# Patient Record
Sex: Female | Born: 1940 | Race: White | Hispanic: No | Smoking: Never smoker
Health system: Southern US, Community
[De-identification: ages and names within clinical notes are randomized; demographics above are authoritative.]

## PROBLEM LIST (undated history)

## (undated) DIAGNOSIS — M810 Age-related osteoporosis without current pathological fracture: Secondary | ICD-10-CM

## (undated) DIAGNOSIS — R413 Other amnesia: Secondary | ICD-10-CM

## (undated) DIAGNOSIS — E7800 Pure hypercholesterolemia, unspecified: Secondary | ICD-10-CM

## (undated) DIAGNOSIS — I10 Essential (primary) hypertension: Secondary | ICD-10-CM

## (undated) DIAGNOSIS — E222 Syndrome of inappropriate secretion of antidiuretic hormone: Secondary | ICD-10-CM

## (undated) DIAGNOSIS — F32A Depression, unspecified: Secondary | ICD-10-CM

## (undated) DIAGNOSIS — I340 Nonrheumatic mitral (valve) insufficiency: Secondary | ICD-10-CM

## (undated) DIAGNOSIS — Z7901 Long term (current) use of anticoagulants: Secondary | ICD-10-CM

## (undated) DIAGNOSIS — R339 Retention of urine, unspecified: Secondary | ICD-10-CM

## (undated) DIAGNOSIS — S72009A Fracture of unspecified part of neck of unspecified femur, initial encounter for closed fracture: Secondary | ICD-10-CM

## (undated) DIAGNOSIS — R197 Diarrhea, unspecified: Secondary | ICD-10-CM

## (undated) DIAGNOSIS — F329 Major depressive disorder, single episode, unspecified: Secondary | ICD-10-CM

## (undated) DIAGNOSIS — S62109A Fracture of unspecified carpal bone, unspecified wrist, initial encounter for closed fracture: Secondary | ICD-10-CM

## (undated) DIAGNOSIS — Z9229 Personal history of other drug therapy: Secondary | ICD-10-CM

## (undated) DIAGNOSIS — R42 Dizziness and giddiness: Secondary | ICD-10-CM

## (undated) HISTORY — DX: Depression, unspecified: F32.A

## (undated) HISTORY — DX: Personal history of other drug therapy: Z92.29

## (undated) HISTORY — DX: Major depressive disorder, single episode, unspecified: F32.9

## (undated) HISTORY — DX: Dizziness and giddiness: R42

## (undated) HISTORY — PX: MITRAL VALVE REPLACEMENT: SHX147

## (undated) HISTORY — DX: Retention of urine, unspecified: R33.9

## (undated) HISTORY — DX: Long term (current) use of anticoagulants: Z79.01

## (undated) HISTORY — DX: Syndrome of inappropriate secretion of antidiuretic hormone: E22.2

## (undated) HISTORY — DX: Nonrheumatic mitral (valve) insufficiency: I34.0

## (undated) HISTORY — DX: Fracture of unspecified part of neck of unspecified femur, initial encounter for closed fracture: S72.009A

## (undated) HISTORY — DX: Other amnesia: R41.3

## (undated) HISTORY — PX: SHOULDER OPEN ROTATOR CUFF REPAIR: SHX2407

## (undated) HISTORY — DX: Age-related osteoporosis without current pathological fracture: M81.0

## (undated) HISTORY — DX: Pure hypercholesterolemia, unspecified: E78.00

## (undated) HISTORY — DX: Essential (primary) hypertension: I10

## (undated) HISTORY — DX: Diarrhea, unspecified: R19.7

---

## 1998-05-08 ENCOUNTER — Encounter: Payer: Self-pay | Admitting: Obstetrics and Gynecology

## 1998-05-11 ENCOUNTER — Ambulatory Visit (HOSPITAL_COMMUNITY): Admission: RE | Admit: 1998-05-11 | Discharge: 1998-05-11 | Payer: Self-pay | Admitting: Obstetrics and Gynecology

## 1998-07-20 ENCOUNTER — Ambulatory Visit (HOSPITAL_COMMUNITY): Admission: RE | Admit: 1998-07-20 | Discharge: 1998-07-20 | Payer: Self-pay | Admitting: Obstetrics and Gynecology

## 1998-07-20 ENCOUNTER — Encounter: Payer: Self-pay | Admitting: Obstetrics and Gynecology

## 1999-06-07 ENCOUNTER — Other Ambulatory Visit: Admission: RE | Admit: 1999-06-07 | Discharge: 1999-06-07 | Payer: Self-pay | Admitting: Obstetrics and Gynecology

## 2000-10-26 ENCOUNTER — Other Ambulatory Visit: Admission: RE | Admit: 2000-10-26 | Discharge: 2000-10-26 | Payer: Self-pay | Admitting: Obstetrics and Gynecology

## 2004-05-06 ENCOUNTER — Ambulatory Visit: Payer: Self-pay | Admitting: Internal Medicine

## 2004-08-13 ENCOUNTER — Ambulatory Visit: Payer: Self-pay | Admitting: Internal Medicine

## 2004-08-30 ENCOUNTER — Encounter: Payer: Self-pay | Admitting: Gastroenterology

## 2006-12-08 DIAGNOSIS — J309 Allergic rhinitis, unspecified: Secondary | ICD-10-CM | POA: Insufficient documentation

## 2006-12-08 DIAGNOSIS — F3289 Other specified depressive episodes: Secondary | ICD-10-CM | POA: Insufficient documentation

## 2006-12-08 DIAGNOSIS — F329 Major depressive disorder, single episode, unspecified: Secondary | ICD-10-CM

## 2008-07-23 ENCOUNTER — Emergency Department (HOSPITAL_COMMUNITY): Admission: EM | Admit: 2008-07-23 | Discharge: 2008-07-23 | Payer: Self-pay | Admitting: Emergency Medicine

## 2009-12-11 ENCOUNTER — Encounter (INDEPENDENT_AMBULATORY_CARE_PROVIDER_SITE_OTHER): Payer: Self-pay | Admitting: *Deleted

## 2010-01-30 ENCOUNTER — Ambulatory Visit: Payer: Self-pay | Admitting: Gastroenterology

## 2010-01-30 DIAGNOSIS — R197 Diarrhea, unspecified: Secondary | ICD-10-CM | POA: Insufficient documentation

## 2010-02-12 ENCOUNTER — Encounter (INDEPENDENT_AMBULATORY_CARE_PROVIDER_SITE_OTHER): Payer: Self-pay | Admitting: *Deleted

## 2010-06-25 NOTE — Letter (Signed)
Summary: New Patient letter  [facility]  [address] [place], [place] [address]   Phone: [phone]  Fax: [phone]       12/11/2009 MRN: [id number]  [facility] [address] [place], [place]  [address]  Dear Jodi Frank,  Welcome to the Gastroenterology Division at [facility].    You are scheduled to see Dr.  Russella Dar on 01-30-10 at 3:30p.m. on the 3rd floor at [facility], [address]  We ask that you try to arrive at our office 15 minutes prior to your appointment time to allow for check-in.  We would like you to complete the enclosed self-administered evaluation form prior to your visit and bring it with you on the day of your appointment.  We will review it with you.  Also, please bring a complete list of all your medications or, if you prefer, bring the medication bottles and we will list them.  Please bring your insurance card so that we may make a copy of it.  If your insurance requires a referral to see a specialist, please bring your referral form from your primary care physician.  Co-payments are due at the time of your visit and may be paid by cash, check or credit card.     Your office visit will consist of a consult with your physician (includes a physical exam), any laboratory testing he/she may order, scheduling of any necessary diagnostic testing (e.g. x-ray, ultrasound, CT-scan), and scheduling of a procedure (e.g. Endoscopy, Colonoscopy) if required.  Please allow enough time on your schedule to allow for any/all of these possibilities.    If you cannot keep your appointment, please call [phone] to cancel or reschedule prior to your appointment date.  This allows [place] the opportunity to schedule an appointment for another patient in need of care.  If you do not cancel or reschedule by 5 p.m. the business day prior to your appointment date, you will be charged a $50.00 late cancellation/no-show fee.    Thank you for choosing [facility] for your medical needs.  We appreciate the opportunity to care for you.  Please visit [place] at our website  to learn more about our practice.                     Sincerely,                                                             The Gastroenterology Division

## 2010-06-25 NOTE — Letter (Signed)
Summary: [facility] Instructions  [facility]  [address] [place], [place] [address]   Phone: [phone]  Fax: [phone]       Jodi Frank    1941/01/31    MRN: [id number]        Procedure Day /Date: Monday November 21st, 2011     Arrival Time: 9:00am     Procedure Time: 10:00am     Location of Procedure:                    _x _  [facility] (4th Floor)                        PREPARATION FOR COLONOSCOPY WITH MOVIPREP   Starting 5 days prior to your procedure 04/10/10 do not eat nuts, seeds, popcorn, corn, beans, peas,  salads, or any raw vegetables.  Do not take any fiber supplements (e.g. Metamucil, Citrucel, and Benefiber).  THE DAY BEFORE YOUR PROCEDURE         DATE: 04/14/10  DAY: Sunday  1.  Drink clear liquids the entire day-NO SOLID FOOD  2.  Do not drink anything colored red or purple.  Avoid juices with pulp.  No orange juice.  3.  Drink at least 64 oz. (8 glasses) of fluid/clear liquids during the day to prevent dehydration and help the prep work efficiently.  CLEAR LIQUIDS INCLUDE: Water Jello Ice Popsicles Tea (sugar ok, no milk/cream) Powdered fruit flavored drinks Coffee (sugar ok, no milk/cream) Gatorade Juice: apple, white grape, white cranberry  Lemonade Clear bullion, consomm, broth Carbonated beverages (any kind) Strained chicken noodle soup Hard Candy                             4.  In the morning, mix first dose of MoviPrep solution:    Empty 1 Pouch A and 1 Pouch B into the disposable container    Add lukewarm drinking water to the top line of the container. Mix to dissolve    Refrigerate (mixed solution should be used within 24 hrs)  5.  Begin drinking the prep at 5:00 p.m. The MoviPrep container is divided by 4 marks.   Every 15 minutes drink the solution down to the next mark (approximately 8 oz) until the full liter is complete.   6.  Follow completed prep with 16 oz of clear liquid of your choice  (Nothing red or purple).  Continue to drink clear liquids until bedtime.  7.  Before going to bed, mix second dose of MoviPrep solution:    Empty 1 Pouch A and 1 Pouch B into the disposable container    Add lukewarm drinking water to the top line of the container. Mix to dissolve    Refrigerate  THE DAY OF YOUR PROCEDURE      DATE: 04/15/10 DAY: Monday  Beginning at 5:00 a.m. (5 hours before procedure):         1. Every 15 minutes, drink the solution down to the next mark (approx 8 oz) until the full liter is complete.  2. Follow completed prep with 16 oz. of clear liquid of your choice.    3. You may drink clear liquids until 8:00am  (2 HOURS BEFORE PROCEDURE).   MEDICATION INSTRUCTIONS  Unless otherwise instructed, you should take regular prescription medications with a small sip of water   as early as possible the morning of  your procedure.  Stop taking Coumadin on  _ _  (5 days before procedure).  Additional medication instructions:  You will be contaced by our office prior to your procedure for directions on holding your Coumadin/Warfarin.  If you do not hear from our office 1 week prior to your scheduled procedure, please call [phone] to discuss.          OTHER INSTRUCTIONS  You will need a responsible adult at least 68 years of age to accompany you and drive you home.   This person must remain in the waiting room during your procedure.  Wear loose fitting clothing that is easily removed.  Leave jewelry and other valuables at home.  However, you may wish to bring a book to read or  an iPod/MP3 player to listen to music as you wait for your procedure to start.  Remove all body piercing jewelry and leave at home.  Total time from sign-in until discharge is approximately 2-3 hours.  You should go home directly after your procedure and rest.  You can resume normal activities the  day after your procedure.  The day of your procedure you should not:    Drive   Make legal decisions   Operate machinery   Drink alcohol   Return to work  You will receive specific instructions about eating, activities and medications before you leave.    The above instructions have been reviewed and explained to me by   Marchelle Folks.     I fully understand and can verbalize these instructions _____________________________ Date _________

## 2010-06-25 NOTE — Assessment & Plan Note (Signed)
Summary: discuss colon on BT--ch.   History of Present Illness Visit Type: new patient  Primary GI MD: Elie Goody MD [facility] Primary Provider: Sinclair Ship, MD  Requesting Provider: na Chief Complaint: Change in bowel habits, patient has been having loose stools x six months. Pt denies anyother GI complaint History of Present Illness:   This is a 70 year old female, who relates a change in bowel habits, with looser stools, beginning approximately 6 months ago. She states her primary physician obtained stool Hemoccults which were adn blood test which were negative. She states she has a one loose stool daily which is a change from one well-formed stool daily. She has no diet or medication changes.  She underwent a screening colonoscopy in April 2006 by Dr. Achilles Dunk at [facility] which was normal. 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   GI Review of Systems      Denies abdominal pain, acid reflux, belching, bloating, chest pain, dysphagia with liquids, dysphagia with solids, heartburn, loss of appetite, nausea, vomiting, vomiting blood, weight loss, and  weight gain.      Reports change in bowel habits.     Denies anal fissure, black tarry stools, constipation, diarrhea, diverticulosis, fecal incontinence, heme positive stool, hemorrhoids, irritable bowel syndrome, jaundice, light color stool, liver problems, rectal bleeding, and  rectal pain.   Current Medications (verified): 1)  Amlodipine Besylate 5 Mg Tabs (Amlodipine Besylate) .... One Tablet By Mouth Once Daily 2)  Benicar 40 Mg Tabs (Olmesartan Medoxomil) .... One Tablet By Mouth Once Daily 3)  Effexor Xr 75 Mg Xr24h-Cap (Venlafaxine Hcl) .... One Tablet By Mouth Once Daily 4)  Multivitamins   Tabs (Multiple Vitamin) .... One Tablet By Mouth Once Daily 5)  Omega-3 350 Mg Caps (Omega-3 Fatty Acids) .... One Capsule By Mouth Once Daily 6)  Warfarin Sodium 10 Mg Tabs  (Warfarin Sodium) .... One Tablet By Mouth On On Tuesday, Thursday, Saturday, and "Sunday 7)  Warfarin Sodium 7.5 Mg Tabs (Warfarin Sodium) .... One Tablet By Mouth On Monday, Wednesday, and Friday 8)  Hydrochlorothiazide 12.5 Mg Caps (Hydrochlorothiazide) .... One Tablet By Mouth Once Daily  Allergies (verified): No Known Drug Allergies  Past History:  Past Medical History: Allergic rhinitis Depression HT  Past Surgical History: MVR, St. 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  Alcohol Use - yes: 1 daily  Daily Caffeine Use: 4 daily  Illicit Drug Use - no Smoking Status:  never Drug Use:  no  Review of Systems       The pertinent positives and negatives are noted as above and in the HPI. All other ROS were reviewed and were negative.   Vital Signs:  Patient profile:   69 year old female Height:      64 inches Weight:      144 pounds BMI:     24" .81 BSA:     1.70 Pulse rate:   92 / minute Pulse rhythm:   regular BP sitting:   128 / 76  (left arm) Cuff size:   regular  Vitals Entered By: Ok Anis CMA (January 30, 2010 3:35 PM)  Physical Exam  General:  Well developed, well nourished, no acute distress. Head:  Normocephalic and atraumatic. Eyes:  PERRLA, no icterus. Ears:  Normal auditory acuity. Mouth:  No deformity or lesions, dentition normal. Neck:  Supple; no masses or thyromegaly. Lungs:  Clear throughout to auscultation. Heart:  Regular rate and rhythm; no murmurs, rubs,  or bruits. Abdomen:  Soft, nontender and nondistended. No masses, hepatosplenomegaly or hernias noted. Normal bowel sounds. Rectal:  deferred until time of colonoscopy.   Msk:  Symmetrical with no gross  deformities. Normal posture. Pulses:  Normal pulses noted. Extremities:  No clubbing, cyanosis, edema or deformities noted. Neurologic:  Alert and  oriented x4;  grossly normal neurologically. Cervical Nodes:  No significant cervical adenopathy. Inguinal Nodes:  No significant inguinal adenopathy. Psych:  Alert and cooperative. Normal mood and affect.  Impression & Recommendations:  Problem # 1:  DIARRHEA (ICD-787.91) Change in bowel habits, with looser stools, mild diarrhea. Obtain records from her primary physician. Trial of Florastor twice daily. Trial of a lactose-free diet. Consider a trial of metronidazole. Rule out inflammatory bowel disease and microscopic colitis. The risks, benefits and alternatives to colonoscopy with possible biopsy and possible polypectomy were discussed with the patient and they consent to proceed. The procedure will be scheduled electively. If her bowel habits return to normal after a course of Florastor, I would consider canceling the colonoscopy given her ongoing need for warfarin. Orders: Colonoscopy (Colon)  Problem # 2:  ENCOUNTER FOR LONG-TERM USE OF ANTICOAGULANTS (ICD-V58.61) Status post St. Jude mitral valve replacement. Contact the Coumadin clinic for anticoagulation management around the time of her colonoscopy. Orders: Colonoscopy (Colon)  Patient Instructions: 1)  Start Florastor one capsule by mouth two times a day. 2)  Colonoscopy brochure given.  3)  Copy sent to : Sinclair Ship, MD 4)  The medication list was reviewed and reconciled.  All changed / newly prescribed medications were explained.  A complete medication list was provided to the patient / caregiver.  Prescriptions: MOVIPREP 100 GM  SOLR (PEG-KCL-NACL-NASULF-NA ASC-C) As per prep instructions.  #1 x 0   Entered by:   Christie Nottingham CMA (AAMA)   Authorized by:   Meryl Dare MD [facility]   Signed by:   Christie Nottingham CMA (AAMA) on 01/30/2010   Method used:   Print then Give  to Patient   RxID:   [id number]

## 2010-06-25 NOTE — Letter (Signed)
Summary: [facility] Cancel and No Reschedule  [facility]  [address] [place], [place] [address]   Phone: [phone]  Fax: [phone]      February 12, 2010 MRN: [id number]   [facility] [address] [place], [place]  [address]     You recently cancelled your colonoscopy procedure at the [facility] and did not reschedule for another date.    Your Juliza Machnik recommended this procedure for the benefit of your health.  It is very important that you reschedule it.  Failure to do so may be to the detriment of your health.  Please call us at [phone] and we will be happy to assist you with rescheduling.    If you were referred for this procedure by another physician/Mekenna Finau, we will notify him/her that you did not keep your appointment.   Sincerely,  [facility]

## 2010-06-25 NOTE — Procedures (Signed)
Summary: Janeece Fitting MD  Colon/John Ivan Anchors MD   Imported By: Lester Ramey 02/05/2010 10:11:53  _____________________________________________________________________  External Attachment:    Type:   Image     Comment:   External Document

## 2010-07-24 ENCOUNTER — Telehealth: Payer: Self-pay | Admitting: Gastroenterology

## 2010-07-26 ENCOUNTER — Encounter: Payer: Self-pay | Admitting: Gastroenterology

## 2010-08-01 ENCOUNTER — Encounter (INDEPENDENT_AMBULATORY_CARE_PROVIDER_SITE_OTHER): Payer: Self-pay | Admitting: *Deleted

## 2010-08-01 NOTE — Letter (Signed)
Summary: Anticoagulation Modification Letter   G[facility]  [address] [place], [place] [address]   Phone: [phone]  Fax: [phone]    July 26, 2010  Re:    Jodi Frank DOB:    [birth year] MRN:    [id number]    Dear Cory Roughen,   We need to scheduled the above patient for an endoscopic procedure colonoscopy . Our records show that she is on anticoagulation therapy. Please advise as to how long the patient may come off their therapy of coumadin prior to the scheduled procedure(s)    Please fax  the completed form to Darcey Nora, RN [facility] at [phone]  Thank you for your help with this matter.  Sincerely,  Darcey Nora RN, [facility]   Physician Recommendation:  Hold Plavix 7 days prior ________________  Hold Coumadin 5 days prior ____________  Other ______________________________

## 2010-08-01 NOTE — Progress Notes (Signed)
Summary: ? re appt   Phone Note Call from Patient Call back at Work Phone [phone]   Caller: Patient Call For: Dr Russella Dar Reason for Call: Refill Medication Summary of Call: Patient wants to know if she needs to see Dr Russella Dar again for office visit because she's on Warfarin, patient saw him in September for the same reason (consult before colon) but it's been 5 months already. Initial call taken by: Tawni Levy,  July 24, 2010 9:17 AM  Follow-up for Phone Call        Left message for patient to call back Darcey Nora RN, [facility]  July 24, 2010 11:47 AM  Still having loose stool, she can't remember if florastor helped.  She is still on coumadin it is managed at [facility] she will get me that number.  She was scheduled for a colon in 01/2010 but canceled.  Is it ok to reschedule her colon? She is also asking if she should try the florastor again prior to scheduling colon.  See office note from 01/2010 Follow-up by: Darcey Nora RN, CGRN,  July 24, 2010 4:33 PM  Additional Follow-up for Phone Call Additional follow up Details #1::        Retry Florastor bid for 2-4 week but needs colonoscopy to evaluate for the cause. Avoid milk products and minimize caffeine. Send stool hemocults, C&S, O&P, C. Diff PCR, WBCs. OK to schedule direct colonoscopy. Need clearance from PCP or Cardiologist to hold Coumadin for 5 days before colonoscpoy. If stool studies give and answer and she is heme negative could cancel colonoscopy. Additional Follow-up by: Meryl Dare MD Clementeen Graham,  July 24, 2010 8:19 PM    Additional Follow-up for Phone Call Additional follow up Details #2::    Left message for patient to call back Darcey Nora RN, [facility]  July 25, 2010 9:47 AM  Left message for patient to call back Darcey Nora RN, [facility]  July 25, 2010 4:23 PM  Left message for patient to call back Darcey Nora RN, [facility]  July 26, 2010 10:08 AM  Her Coumadin is managed by Cay Schillings at [facility] [phone].  I  have sent a letter to her cardiologist Dr Cory Roughen at [facility].  [phone], fax # [phone] Darcey Nora RN, [facility]  July 26, 2010 11:38 AM  Additional Follow-up for Phone Call Additional follow up Details #3:: Details for Additional Follow-up Action Taken: I spoke with the patient and she is scehduled for a colon for 09/20/10.  She wants to come for her pre-visit 08/02/10 when she comes to pick up her lab specimen cips.  She is aware I have sent a letter to Dr Cory Roughen about holding her coumadin. Additional Follow-up by: Darcey Nora RN, CGRN,  July 26, 2010 4:47 PM

## 2010-08-02 ENCOUNTER — Encounter: Payer: Self-pay | Admitting: Gastroenterology

## 2010-08-02 ENCOUNTER — Encounter (INDEPENDENT_AMBULATORY_CARE_PROVIDER_SITE_OTHER): Payer: Self-pay | Admitting: *Deleted

## 2010-08-02 ENCOUNTER — Other Ambulatory Visit: Payer: PRIVATE HEALTH INSURANCE

## 2010-08-02 DIAGNOSIS — R197 Diarrhea, unspecified: Secondary | ICD-10-CM

## 2010-08-06 ENCOUNTER — Encounter: Payer: Self-pay | Admitting: Gastroenterology

## 2010-08-06 NOTE — Letter (Signed)
Summary: [facility] Instructions  [facility]  [address] [place], [place] [address]   Phone: [phone]  Fax: [phone]       KYNLI CHOU    11-18-1940    MRN: [id number]        Procedure Day Dorna Bloom:  Farrell Ours  09/20/10     Arrival Time:  8:30AM     Procedure Time:  9:30AM     Location of Procedure:                    Juliann Pares  [facility] (4th Floor)                      PREPARATION FOR COLONOSCOPY WITH MOVIPREP   Starting 5 days prior to your procedure 09/15/10 do not eat nuts, seeds, popcorn, corn, beans, peas,  salads, or any raw vegetables.  Do not take any fiber supplements (e.g. Metamucil, Citrucel, and Benefiber).  THE DAY BEFORE YOUR PROCEDURE         DATE: 09/19/10  DAY: THURSDAY  1.  Drink clear liquids the entire day-NO SOLID FOOD  2.  Do not drink anything colored red or purple.  Avoid juices with pulp.  No orange juice.  3.  Drink at least 64 oz. (8 glasses) of fluid/clear liquids during the day to prevent dehydration and help the prep work efficiently.  CLEAR LIQUIDS INCLUDE: Water Jello Ice Popsicles Tea (sugar ok, no milk/cream) Powdered fruit flavored drinks Coffee (sugar ok, no milk/cream) Gatorade Juice: apple, white grape, white cranberry  Lemonade Clear bullion, consomm, broth Carbonated beverages (any kind) Strained chicken noodle soup Hard Candy                             4.  In the morning, mix first dose of MoviPrep solution:    Empty 1 Pouch A and 1 Pouch B into the disposable container    Add lukewarm drinking water to the top line of the container. Mix to dissolve    Refrigerate (mixed solution should be used within 24 hrs)  5.  Begin drinking the prep at 5:00 p.m. The MoviPrep container is divided by 4 marks.   Every 15 minutes drink the solution down to the next mark (approximately 8 oz) until the full liter is complete.   6.  Follow completed prep with 16 oz of clear liquid of your choice (Nothing red  or purple).  Continue to drink clear liquids until bedtime.  7.  Before going to bed, mix second dose of MoviPrep solution:    Empty 1 Pouch A and 1 Pouch B into the disposable container    Add lukewarm drinking water to the top line of the container. Mix to dissolve    Refrigerate  THE DAY OF YOUR PROCEDURE      DATE: 09/20/10   DAY: FRIDAY  Beginning at 4:30AM (5 hours before procedure):         1. Every 15 minutes, drink the solution down to the next mark (approx 8 oz) until the full liter is complete.  2. Follow completed prep with 16 oz. of clear liquid of your choice.    3. You may drink clear liquids until 7:30AM (2 HOURS BEFORE PROCEDURE).   MEDICATION INSTRUCTIONS  Unless otherwise instructed, you should take regular prescription medications with a small sip of water   as early as possible the morning of  your procedure.    Stop taking Coumadin on  _ _  (5 days before procedure).  Additional medication instructions:Call Sheri at [phone] if you haven't heard from her by 09/13/10 re: coumadin/Lovenox [place].         OTHER INSTRUCTIONS  You will need a responsible adult at least 68 years of age to accompany you and drive you home.   This person must remain in the waiting room during your procedure.  Wear loose fitting clothing that is easily removed.  Leave jewelry and other valuables at home.  However, you may wish to bring a book to read or  an iPod/MP3 player to listen to music as you wait for your procedure to start.  Remove all body piercing jewelry and leave at home.  Total time from sign-in until discharge is approximately 2-3 hours.  You should go home directly after your procedure and rest.  You can resume normal activities the  day after your procedure.  The day of your procedure you should not:   Drive   Make legal decisions   Operate machinery   Drink alcohol   Return to work  You will receive specific instructions about eating,  activities and medications before you leave.    The above instructions have been reviewed and explained to me by   Wyona Almas RN  August 02, 2066 10:35 AM     I fully understand and can verbalize these instructions _____________________________ Date _________

## 2010-08-06 NOTE — Miscellaneous (Signed)
Summary: [facility] Previsit/prep  Clinical Lists Changes  Medications: Added new medication of MOVIPREP 100 GM  SOLR (PEG-KCL-NACL-NASULF-NA ASC-C) As per prep instructions. - Signed Rx of MOVIPREP 100 GM  SOLR (PEG-KCL-NACL-NASULF-NA ASC-C) As per prep instructions.;  #1 x 0;  Signed;  Entered by: Wyona Almas RN;  Authorized by: Meryl Dare MD Clementeen Graham;  Method used: Electronically to [facility] [phone] [place], [place]  [address], Ph: [phone], Fax: [phone] Observations: Added new observation of NKA: T (08/02/2010 10:02)    Prescriptions: MOVIPREP 100 GM  SOLR (PEG-KCL-NACL-NASULF-NA ASC-C) As per prep instructions.  #1 x 0   Entered by:   Wyona Almas RN   Authorized by:   Meryl Dare MD [facility]   Signed by:   Wyona Almas RN on 08/02/2010   Method used:   Electronically to        [facility]* (retail)       [phone] [place], [place]  [address]       Ph: [phone]       Fax: [phone]   RxID:   [id number]

## 2010-08-07 ENCOUNTER — Encounter: Payer: Self-pay | Admitting: Gastroenterology

## 2010-08-08 ENCOUNTER — Telehealth: Payer: Self-pay

## 2010-08-22 ENCOUNTER — Other Ambulatory Visit: Payer: Self-pay | Admitting: Gastroenterology

## 2010-08-22 ENCOUNTER — Other Ambulatory Visit: Payer: PRIVATE HEALTH INSURANCE

## 2010-08-22 DIAGNOSIS — Z1289 Encounter for screening for malignant neoplasm of other sites: Secondary | ICD-10-CM

## 2010-08-22 LAB — HEMOCCULT SLIDES (X 3 CARDS)
Fecal Occult Blood: NEGATIVE
OCCULT 1: NEGATIVE
OCCULT 2: NEGATIVE
OCCULT 3: NEGATIVE
OCCULT 4: NEGATIVE
OCCULT 5: NEGATIVE

## 2010-08-22 NOTE — Progress Notes (Signed)
Summary: Lovenox Bridge?  Phone Note Outgoing Call Call back at [phone]   Summary of Call: I have left a messge for the Cay Schillings at [facility] [phone] coumadin clinic.  Per Dr Cory Roughen the patient needs a "heparin bridge" for a colonoscopy.  I did speak with the patient and Dr Russella Dar and they don't want to do a traditional heparin bridge involving admission.  I have asked Dr Electa Sniff if we would use Lovenox.  Patient is scheduled for a colon 09/20/10 Initial call taken by: Darcey Nora RN, CGRN,  August 08, 2010 11:04 AM  Follow-up for Phone Call        I had a return voicemail message from Cay Schillings from [facility], they are asking if the colon could be done on coumadin. They are not wanting to use Lovenox bridge due to past compications with other patients.  Please advise. Follow-up by: Darcey Nora RN, CGRN,  August 08, 2010 3:48 PM  Additional Follow-up for Phone Call Additional follow up Details #1::        As I previously discussed with Lavonna Rua, we can do the colonoscopy on Coumadin but will not be able to remove larger polyps due to bleeding risk, small polyps should be reasonably safe as long as INR is in the correct range. Check PT/INR at our lab about 4-5 days prior to colonoscopy to be sure she is not supratherapeutic for the colonoscopy. Additional Follow-up by: Meryl Dare MD FACG,  August 10, 2010 11:55 PM    Additional Follow-up for Phone Call Additional follow up Details #2::    Spoke with Cay Schillings and let her know Dr. Ardell Isaacs recommendation. Patient to have PT/INR check on 09/16/10. Patient aware. Follow-up by: Selinda Michaels RN,  August 12, 2010 8:53 AM

## 2010-08-22 NOTE — Letter (Signed)
Summary: Anticoagulation Modification Letter / [facility]  Anticoagulation Modification Letter / [facility]   Imported By: Lennie Odor 08/12/2010 12:09:42  _____________________________________________________________________  External Attachment:    Type:   Image     Comment:   External Document

## 2010-09-16 ENCOUNTER — Telehealth: Payer: Self-pay | Admitting: Gastroenterology

## 2010-09-16 ENCOUNTER — Other Ambulatory Visit: Payer: Self-pay | Admitting: Gastroenterology

## 2010-09-16 ENCOUNTER — Other Ambulatory Visit (INDEPENDENT_AMBULATORY_CARE_PROVIDER_SITE_OTHER): Payer: PRIVATE HEALTH INSURANCE

## 2010-09-16 DIAGNOSIS — Z7901 Long term (current) use of anticoagulants: Secondary | ICD-10-CM

## 2010-09-16 LAB — PROTIME-INR: Prothrombin Time: 29.6 s — ABNORMAL HIGH (ref 10.2–12.4)

## 2010-09-16 NOTE — Telephone Encounter (Signed)
I have left a message for the patient that Dr Russella Dar reviewed the PT/INR and ok to proceed with colon on 09/20/10.  I have asked her to please call back for any further questions.

## 2010-09-16 NOTE — Progress Notes (Signed)
I have left a message for the patient ok for colon in the rest of the week

## 2010-09-18 ENCOUNTER — Encounter: Payer: Self-pay | Admitting: Gastroenterology

## 2010-09-19 ENCOUNTER — Encounter: Payer: Self-pay | Admitting: *Deleted

## 2010-09-20 ENCOUNTER — Ambulatory Visit (AMBULATORY_SURGERY_CENTER): Payer: PRIVATE HEALTH INSURANCE | Admitting: Gastroenterology

## 2010-09-20 ENCOUNTER — Encounter: Payer: Self-pay | Admitting: Gastroenterology

## 2010-09-20 VITALS — BP 166/75 | HR 80 | Temp 97.6°F | Resp 20 | Ht 64.0 in | Wt 140.0 lb

## 2010-09-20 DIAGNOSIS — D126 Benign neoplasm of colon, unspecified: Secondary | ICD-10-CM

## 2010-09-20 DIAGNOSIS — R198 Other specified symptoms and signs involving the digestive system and abdomen: Secondary | ICD-10-CM

## 2010-09-20 DIAGNOSIS — R197 Diarrhea, unspecified: Secondary | ICD-10-CM

## 2010-09-20 MED ORDER — SODIUM CHLORIDE 0.9 % IV SOLN: 500.0000 mL | INTRAVENOUS | Status: DC

## 2010-09-20 NOTE — Patient Instructions (Signed)
See blue and green instruction sheets.

## 2010-09-23 ENCOUNTER — Telehealth: Payer: Self-pay | Admitting: *Deleted

## 2010-09-23 NOTE — Telephone Encounter (Signed)
No ID on voice mail.  No message left at this time.

## 2010-09-24 ENCOUNTER — Encounter: Payer: Self-pay | Admitting: Gastroenterology

## 2011-06-17 DIAGNOSIS — I701 Atherosclerosis of renal artery: Secondary | ICD-10-CM | POA: Insufficient documentation

## 2011-09-04 DIAGNOSIS — L719 Rosacea, unspecified: Secondary | ICD-10-CM | POA: Insufficient documentation

## 2011-09-05 DIAGNOSIS — N3281 Overactive bladder: Secondary | ICD-10-CM | POA: Insufficient documentation

## 2011-10-25 DIAGNOSIS — S72009A Fracture of unspecified part of neck of unspecified femur, initial encounter for closed fracture: Secondary | ICD-10-CM

## 2011-10-25 HISTORY — DX: Fracture of unspecified part of neck of unspecified femur, initial encounter for closed fracture: S72.009A

## 2011-12-19 ENCOUNTER — Emergency Department (HOSPITAL_COMMUNITY): Payer: PRIVATE HEALTH INSURANCE

## 2011-12-19 ENCOUNTER — Emergency Department (HOSPITAL_COMMUNITY)
Admission: EM | Admit: 2011-12-19 | Discharge: 2011-12-19 | Disposition: A | Discharge status: Home / Self Care | Payer: PRIVATE HEALTH INSURANCE | Attending: Emergency Medicine | Admitting: Emergency Medicine

## 2011-12-19 ENCOUNTER — Encounter (HOSPITAL_COMMUNITY): Payer: Self-pay | Admitting: Emergency Medicine

## 2011-12-19 DIAGNOSIS — S1093XA Contusion of unspecified part of neck, initial encounter: Secondary | ICD-10-CM | POA: Insufficient documentation

## 2011-12-19 DIAGNOSIS — Z7901 Long term (current) use of anticoagulants: Secondary | ICD-10-CM | POA: Insufficient documentation

## 2011-12-19 DIAGNOSIS — I10 Essential (primary) hypertension: Secondary | ICD-10-CM | POA: Insufficient documentation

## 2011-12-19 DIAGNOSIS — S0990XA Unspecified injury of head, initial encounter: Secondary | ICD-10-CM | POA: Insufficient documentation

## 2011-12-19 DIAGNOSIS — J320 Chronic maxillary sinusitis: Secondary | ICD-10-CM | POA: Insufficient documentation

## 2011-12-19 DIAGNOSIS — S0003XA Contusion of scalp, initial encounter: Secondary | ICD-10-CM | POA: Insufficient documentation

## 2011-12-19 DIAGNOSIS — W010XXA Fall on same level from slipping, tripping and stumbling without subsequent striking against object, initial encounter: Secondary | ICD-10-CM | POA: Insufficient documentation

## 2011-12-19 LAB — PROTIME-INR
INR: 2.24 — ABNORMAL HIGH (ref 0.00–1.49)
Prothrombin Time: 25.2 seconds — ABNORMAL HIGH (ref 11.6–15.2)

## 2011-12-19 MED ORDER — OXYCODONE-ACETAMINOPHEN 5-325 MG PO TABS: 1.0000 | ORAL_TABLET | ORAL | Status: AC | PRN

## 2011-12-19 MED ORDER — OXYCODONE-ACETAMINOPHEN 5-325 MG PO TABS
1.0000 | ORAL_TABLET | Freq: Once | ORAL | Status: AC
  Administered 2011-12-19: 1 via ORAL
  Filled 2011-12-19: qty 1

## 2011-12-19 NOTE — ED Notes (Signed)
Patient transported to CT 

## 2011-12-19 NOTE — ED Notes (Signed)
Pt states she fell in a parking deck Wed. And has bruising to L eye/face and L ribs.

## 2011-12-19 NOTE — ED Notes (Signed)
Pt states she is worried about the swelling to her eye and cheek but denies visual changes.

## 2011-12-26 NOTE — ED Provider Notes (Signed)
History    71 year old female with facial pain and swelling. She had mechanical fall on Wednesday. She struck the left side of her face. No loss of consciousness. Come in today because of persistent swelling. No change in visual acuity. No numbness, tingling or loss of strength. Denies significant headache or neck pain. Back pain. Some mild pain in her left axillary region. No shortness of breath. Patient is on Coumadin.  CSN: [id number]  Arrival date & time 12/19/11  7829   First MD Initiated Contact with Patient 12/19/11 [phone]      Chief Complaint  Patient presents with  . Bleeding/Bruising    (Consider location/radiation/quality/duration/timing/severity/associated sxs/prior treatment) HPI  Past Medical History  Diagnosis Date  . Depression   . Diarrhea   . 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 Hypertension     Past Surgical History  Procedure Date  . Mitral valve replacement     Pt. is on coumadin.  . Shoulder open rotator cuff repair     Family History  Problem Relation Age of Onset  . Stroke Mother   . Colon cancer Paternal Grandmother     History  Substance Use Topics  . Smoking status: Never Smoker   . Smokeless tobacco: Never Used  . Alcohol Use: 4.2 oz/week    7 Glasses of wine per week    OB History    Grav Para Term Preterm Abortions TAB SAB Ect Mult Living                  Review of Systems   Review of symptoms negative unless otherwise noted in HPI.   Allergies  Review of patient's allergies indicates no known allergies.  Home Medications   Current Outpatient Rx  Name Route Sig Dispense Refill  . AMLODIPINE BESYLATE 2.5 MG PO TABS Oral Take 2.5 mg by mouth daily.    . ATENOLOL 25 MG PO TABS Oral Take 25 mg by mouth daily.    Marland Kitchen DOXYCYCLINE HYCLATE 50 MG PO CAPS Oral Take 50 mg by mouth at bedtime.    . OMEGA-3 FATTY ACIDS 1000 MG PO CAPS Oral Take 2 g by mouth daily.    . ADULT MULTIVITAMIN W/MINERALS CH Oral Take 1 tablet by  mouth daily.    Marland Kitchen OLMESARTAN MEDOXOMIL 40 MG PO TABS Oral Take 40 mg by mouth daily.      . VENLAFAXINE HCL ER 150 MG PO CP24 Oral Take 150 mg by mouth daily.    . WARFARIN SODIUM 10 MG PO TABS Oral Take 10 mg by mouth daily. Taken on Tuesday, Thursday and Saturday.    . WARFARIN SODIUM 7.5 MG PO TABS Oral Take 7.5 mg by mouth as directed. Taken on Mondays, Wednesdays, Fridays and Sundays.    . OXYCODONE-ACETAMINOPHEN 5-325 MG PO TABS Oral Take 1 tablet by mouth every 4 (four) hours as needed for pain. 10 tablet 0    BP 118/74  Pulse 66  Temp 98.3 F (36.8 C) (Oral)  Resp 16  SpO2 100%  Physical Exam  Nursing note and vitals reviewed. Constitutional: She is oriented to person, place, and time. She appears well-developed and well-nourished. No distress.  HENT:  Head: Normocephalic.       Left periorbital ecchymosis and swelling. Moderate tenderness in the same distribution.  Eyes: Conjunctivae and EOM are normal. Pupils are equal, round, and reactive to light. Right eye exhibits no discharge. Left eye exhibits no discharge.  Neck: Neck supple.  Cardiovascular: Normal rate, regular rhythm and normal heart sounds.  Exam reveals no gallop and no friction rub.   No murmur heard. Pulmonary/Chest: Effort normal and breath sounds normal. No respiratory distress.  Abdominal: Soft. She exhibits no distension. There is no tenderness.  Musculoskeletal: She exhibits no edema and no tenderness.       No midline spinal tenderness  Neurological: She is alert and oriented to person, place, and time. No cranial nerve deficit. She exhibits normal muscle tone. Coordination normal.       Good finger to nose testing bilaterally. Gait is steady.  Skin: Skin is warm and dry.  Psychiatric: She has a normal mood and affect. Her behavior is normal. Thought content normal.    ED Course  Procedures (including critical care time)  Labs Reviewed  PROTIME-INR - Abnormal; Notable for the following:     Prothrombin Time 25.2 (*)     INR 2.24 (*)     All other components within normal limits  LAB REPORT - SCANNED   No results found.  Ct Maxillofacial Wo Cm  12/19/2011  *RADIOLOGY REPORT*  Clinical Data: Fall.  Left facial pain, bruising, and bleeding.  CT MAXILLOFACIAL WITHOUT CONTRAST  Technique:  Multidetector CT imaging of the maxillofacial structures was performed. Multiplanar CT image reconstructions were also generated.  Comparison: None.  Findings: Scalp hematoma is seen in the left infratemporal fossa as well as hematoma in the subcutaneous tissues of the left maxilla and preseptal soft tissues.  The globes and intraorbital anatomy are normal in appearance.  There is no evidence of orbital or facial bone fracture.  No evidence of sinus air fluid levels or orbital emphysema.  Mucosal thickening seen along the floor of the right maxillary sinus, consistent with chronic sinusitis.  IMPRESSION:  1.  Left facial soft tissue hematoma.  No evidence of facial bone fracture. 2.  Chronic right maxillary sinusitis.  Original Report Authenticated By: Danae Orleans, M.D.    1. Closed head injury   2. Scalp hematoma       MDM  71 year old female with a closed head injury after a fall. Nonfocal neurological examination. No significant headache but a lot of facial pain and swelling. CT of the facial bones without any evidence of fracture. Patient is on Coumadin, but given the stability of symptoms since her fall and nonfocal neurological examination, CT of the head was deferred. Plan symptomatic treatment at this time. Return precautions were discussed. Outpatient followup otherwise to        Raeford Razor, MD 12/26/11 1141

## 2012-10-29 ENCOUNTER — Encounter (HOSPITAL_COMMUNITY): Payer: Self-pay | Admitting: Radiology

## 2012-10-29 ENCOUNTER — Emergency Department (HOSPITAL_COMMUNITY): Payer: PRIVATE HEALTH INSURANCE

## 2012-10-29 ENCOUNTER — Emergency Department (HOSPITAL_COMMUNITY)
Admission: EM | Admit: 2012-10-29 | Discharge: 2012-10-29 | Disposition: A | Discharge status: Home / Self Care | Payer: PRIVATE HEALTH INSURANCE | Attending: Emergency Medicine | Admitting: Emergency Medicine

## 2012-10-29 DIAGNOSIS — Y9389 Activity, other specified: Secondary | ICD-10-CM | POA: Insufficient documentation

## 2012-10-29 DIAGNOSIS — I10 Essential (primary) hypertension: Secondary | ICD-10-CM | POA: Insufficient documentation

## 2012-10-29 DIAGNOSIS — S52502A Unspecified fracture of the lower end of left radius, initial encounter for closed fracture: Secondary | ICD-10-CM

## 2012-10-29 DIAGNOSIS — F329 Major depressive disorder, single episode, unspecified: Secondary | ICD-10-CM | POA: Insufficient documentation

## 2012-10-29 DIAGNOSIS — Z79899 Other long term (current) drug therapy: Secondary | ICD-10-CM | POA: Insufficient documentation

## 2012-10-29 DIAGNOSIS — Z7901 Long term (current) use of anticoagulants: Secondary | ICD-10-CM | POA: Insufficient documentation

## 2012-10-29 DIAGNOSIS — F3289 Other specified depressive episodes: Secondary | ICD-10-CM | POA: Insufficient documentation

## 2012-10-29 DIAGNOSIS — Y9289 Other specified places as the place of occurrence of the external cause: Secondary | ICD-10-CM | POA: Insufficient documentation

## 2012-10-29 DIAGNOSIS — S52509A Unspecified fracture of the lower end of unspecified radius, initial encounter for closed fracture: Secondary | ICD-10-CM | POA: Insufficient documentation

## 2012-10-29 DIAGNOSIS — Z8679 Personal history of other diseases of the circulatory system: Secondary | ICD-10-CM | POA: Insufficient documentation

## 2012-10-29 DIAGNOSIS — W1809XA Striking against other object with subsequent fall, initial encounter: Secondary | ICD-10-CM | POA: Insufficient documentation

## 2012-10-29 DIAGNOSIS — S0990XA Unspecified injury of head, initial encounter: Secondary | ICD-10-CM | POA: Insufficient documentation

## 2012-10-29 LAB — PROTIME-INR
INR: 3.27 — ABNORMAL HIGH (ref 0.00–1.49)
Prothrombin Time: 31.5 seconds — ABNORMAL HIGH (ref 11.6–15.2)

## 2012-10-29 LAB — BASIC METABOLIC PANEL
Calcium: 9.4 mg/dL (ref 8.4–10.5)
GFR calc Af Amer: >90 mL/min (ref >90)
GFR calc non Af Amer: >90 mL/min (ref >90)
Potassium: 4.4 mEq/L (ref 3.5–5.1)
Sodium: 125 mEq/L — ABNORMAL LOW (ref 135–145)

## 2012-10-29 LAB — CBC
Hemoglobin: 12 g/dL (ref 12.0–15.0)
MCH: 28.9 pg (ref 26.0–34.0)
MCHC: 35.2 g/dL (ref 30.0–36.0)
Platelets: 294 10*3/uL (ref 150–400)
RDW: 14.2 % (ref 11.5–15.5)

## 2012-10-29 MED ORDER — ACETAMINOPHEN 325 MG PO TABS
975.0000 mg | ORAL_TABLET | Freq: Once | ORAL | Status: AC
  Administered 2012-10-29: 975 mg via ORAL
  Filled 2012-10-29: qty 3

## 2012-10-29 MED ORDER — OXYCODONE-ACETAMINOPHEN 5-325 MG PO TABS: 1.0000 | ORAL_TABLET | ORAL | Status: DC | PRN

## 2012-10-29 NOTE — ED Provider Notes (Signed)
History     CSN: [id number]  Arrival date & time 10/29/12  0708   First MD Initiated Contact with Patient 10/29/12 [phone]      chief complaint: Fall head injury, left wrist pain the  HPI Patient reports getting up in the middle the night and falling on her way to the bathroom.  She hit the left side of her head without loss consciousness.  She denies neck pain.  She has mild left-sided head pain at this time.  She also reports pain in her left wrist with obvious deformity and swelling noted.  She's concerned that she may have broken this.  She is on Coumadin for history of mechanical valve.  She reports her pain in her left wrist is moderate in severity.  She's not tried any pain medications.  She denies chest pain shortness of breath.  No abdominal pain.  Mild left hip pain on the lateral aspect.  She did ambulate without any difficulty.   Past Medical History  Diagnosis Date  . Depression   . Diarrhea   . 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 Hypertension     Past Surgical History  Procedure Laterality Date  . Mitral valve replacement      Pt. is on coumadin.  . Shoulder open rotator cuff repair      Family History  Problem Relation Age of Onset  . Stroke Mother   . Colon cancer Paternal Grandmother     History  Substance Use Topics  . Smoking status: Never Smoker   . Smokeless tobacco: Never Used  . Alcohol Use: 4.2 oz/week    7 Glasses of wine per week    OB History   Grav Para Term Preterm Abortions TAB SAB Ect Mult Living                  Review of Systems  All other systems reviewed and are negative.    Allergies  Review of patient's allergies indicates no known allergies.  Home Medications   Current Outpatient Rx  Name  Route  Sig  Dispense  Refill  . amLODipine (NORVASC) 5 MG tablet   Oral   Take 5 mg by mouth every evening.         . Carboxymethylcell-Hypromellose (GENTEAL) 0.25-0.3 % GEL   Ophthalmic   Apply 1 application to eye  every evening.         . fish oil-omega-3 fatty acids 1000 MG capsule   Oral   Take 2 g by mouth 2 (two) times daily.          Marland Kitchen labetalol (NORMODYNE) 300 MG tablet   Oral   Take 600 mg by mouth 2 (two) times daily.         . Multiple Vitamin (MULTIVITAMIN WITH MINERALS) TABS   Oral   Take 1 tablet by mouth daily.         Marland Kitchen venlafaxine XR (EFFEXOR-XR) 150 MG 24 hr capsule   Oral   Take 150 mg by mouth every evening.          . warfarin (COUMADIN) 10 MG tablet   Oral   Take 10 mg by mouth See admin instructions. Only on Tuesday evenings         . warfarin (COUMADIN) 7.5 MG tablet   Oral   Take 7.5 mg by mouth as directed. Take every night except Tuesday           BP  132/78  Pulse 85  Temp(Src) 97.8 F (36.6 C)  Resp 18  SpO2 99%  Physical Exam  Nursing note and vitals reviewed. Constitutional: She is oriented to person, place, and time. She appears well-developed and well-nourished. No distress.  HENT:  Head: Normocephalic and atraumatic.  Eyes: EOM are normal.  Neck: Normal range of motion. Neck supple.  C-spine nontender.  C-spine cleared by Nexus criteria.  Cardiovascular: Normal rate, regular rhythm and normal heart sounds.   Pulmonary/Chest: Effort normal and breath sounds normal. She exhibits no tenderness.  Abdominal: Soft. She exhibits no distension. There is no tenderness.  Musculoskeletal: Normal range of motion.  Full range of motion of left hip with normal external and internal rotation.  Swelling and deformity about her left wrist.  Normal left radial pulse.  Normal strength in her left hand.  Pain with range of motion of her left wrist.  Normal range of motion of her left elbow and left shoulder.  Neurological: She is alert and oriented to person, place, and time.  Skin: Skin is warm and dry.  Psychiatric: She has a normal mood and affect. Judgment normal.    ED Course  Procedures (including critical care time)  SPLINT  APPLICATION Date/Time: 10/01/2012 3:38 PM Authorized by: Lyanne Co Consent: Verbal consent obtained. Risks and benefits: risks, benefits and alternatives were discussed Consent given by: patient Splint applied by: orthopedic technician Location details: left wrist Splint type: left sugartong Supplies used: fiberglass Post-procedure: The splinted body part was neurovascularly unchanged following the procedure. Patient tolerance: Patient tolerated the procedure well with no immediate complications.  Reduction of Fracture Date/Time: 10/24/2012 11:41 AM Performed by: Lyanne Co Authorized by: Lyanne Co Consent: Verbal consent obtained. Risks and benefits: risks, benefits and alternatives were discussed Consent given by: patient Required items: required blood products, implants, devices, and special equipment available Time out: Immediately prior to procedure a "time out" was called to verify the correct patient, procedure, equipment, support staff and site/side marked as required. HEMATOMA BLOCK with 1% Lidocaine (7cc) Patient tolerance: Patient tolerated the procedure well with no immediate complications. Bone: Left distal radius Reduction technique: Manipulation     Labs Reviewed  PROTIME-INR - Abnormal; Notable for the following:    Prothrombin Time 31.5 (*)    INR 3.27 (*)    All other components within normal limits  CBC - Abnormal; Notable for the following:    HCT 34.1 (*)    All other components within normal limits  BASIC METABOLIC PANEL - Abnormal; Notable for the following:    Sodium 125 (*)    Chloride 93 (*)    Glucose, Bld 114 (*)    All other components within normal limits   Dg Wrist 2 Views Left  10/29/2012   *RADIOLOGY REPORT*  Clinical Data: Distal radius fracture.  Post reduction film.  LEFT WRIST - 2 VIEW  Comparison: Plain films 10/29/2012 at 7:28 a.m.  Findings: The patient is now in a plaster cast.  Distal radius fracture with dorsal  angulation is again seen.  Position and alignment are mildly improved.  Distal ulnar fracture seen on the comparison study is not well demonstrated on this exam.  The patient is in a fiberglass cast.  IMPRESSION: Mild improvement in position and alignment of a dorsally angulated distal radius fracture.  No new abnormality.   Original Report Authenticated By: Holley Dexter, M.D.   Dg Wrist Complete Left  10/29/2012   *RADIOLOGY REPORT*  Clinical Data: Left wrist  pain secondary to a fall today. Swelling.  LEFT WRIST - COMPLETE 3+ VIEW  Comparison: None.  Findings: There is a slightly dorsally impacted comminuted fracture of the distal radius involving the articular surface.  There is slight cortical irregularity of the distal ulna medially but I do not see a discrete fracture line.  Diffuse osteopenia.  IMPRESSION: Comminuted slightly dorsally impacted fracture of the distal radius.   Original Report Authenticated By: Francene Boyers, M.D.   Ct Head Wo Contrast  10/29/2012   *RADIOLOGY REPORT*  Clinical Data: Head trauma secondary to a fall this morning.  The patient is on Coumadin.  CT HEAD WITHOUT CONTRAST  Technique:  Contiguous axial images were obtained from the base of the skull through the vertex without contrast.  Comparison: None.  Findings: There is no acute intracranial hemorrhage, infarction, or mass lesion.  Brain parenchyma appears normal.  Osseous structures are normal.  Slight scalp swelling over the left parietal bone which may be due to remote trauma.  The patient reports that she struck the left posterior parietal-occipital region.  IMPRESSION: No significant abnormality.   Original Report Authenticated By: Francene Boyers, M.D.   I personally reviewed the imaging tests through PACS system I reviewed available ER/hospitalization records through the EMR    1. Distal radius fracture, left, closed, initial encounter   2. Head injury without concussion or intracranial hemorrhage, initial  encounter       MDM  Head injury with normal CT.  I discussed with the patient and the importance of returning emergency department for worsening headache or development of nausea vomiting and explained to her the risk of delayed bleed on Coumadin.  She understands the importance of close followup for head injury percussions.  Left distal radius fracture was reduced at the bedside with improvement in the aeration.  I discussed the case with hand surgery who will see the patient on Tuesday for followup.  Placed in a left sugar tong splint.  Neurovascularly intact after splint application.  Discharge home with pain medicine.  Hand surgery: Dr. Bradly Bienenstock, MD         Lyanne Co, MD 10/29/12 [phone]

## 2012-10-29 NOTE — ED Notes (Signed)
Pt presents to ed with c/o fall, pt sts she got up to go to bathroom at night and fell in the dark. Pt has swelling to her left wrist.

## 2012-11-02 ENCOUNTER — Emergency Department (HOSPITAL_COMMUNITY): Payer: PRIVATE HEALTH INSURANCE

## 2012-11-02 ENCOUNTER — Inpatient Hospital Stay (HOSPITAL_COMMUNITY)
Admission: EM | Admit: 2012-11-02 | Discharge: 2012-11-05 | DRG: 481 | Disposition: A | Discharge status: Skilled Nursing Facility | Payer: PRIVATE HEALTH INSURANCE | Attending: Family Medicine | Admitting: Family Medicine

## 2012-11-02 ENCOUNTER — Encounter (HOSPITAL_COMMUNITY): Payer: Self-pay | Admitting: Emergency Medicine

## 2012-11-02 DIAGNOSIS — S72109A Unspecified trochanteric fracture of unspecified femur, initial encounter for closed fracture: Principal | ICD-10-CM | POA: Diagnosis present

## 2012-11-02 DIAGNOSIS — S52539A Colles' fracture of unspecified radius, initial encounter for closed fracture: Secondary | ICD-10-CM | POA: Diagnosis present

## 2012-11-02 DIAGNOSIS — Z7901 Long term (current) use of anticoagulants: Secondary | ICD-10-CM

## 2012-11-02 DIAGNOSIS — Z954 Presence of other heart-valve replacement: Secondary | ICD-10-CM

## 2012-11-02 DIAGNOSIS — E871 Hypo-osmolality and hyponatremia: Secondary | ICD-10-CM

## 2012-11-02 DIAGNOSIS — S72002A Fracture of unspecified part of neck of left femur, initial encounter for closed fracture: Secondary | ICD-10-CM

## 2012-11-02 DIAGNOSIS — Z66 Do not resuscitate: Secondary | ICD-10-CM | POA: Diagnosis present

## 2012-11-02 DIAGNOSIS — W19XXXA Unspecified fall, initial encounter: Secondary | ICD-10-CM | POA: Diagnosis present

## 2012-11-02 DIAGNOSIS — J309 Allergic rhinitis, unspecified: Secondary | ICD-10-CM

## 2012-11-02 DIAGNOSIS — R197 Diarrhea, unspecified: Secondary | ICD-10-CM

## 2012-11-02 DIAGNOSIS — S72009A Fracture of unspecified part of neck of unspecified femur, initial encounter for closed fracture: Secondary | ICD-10-CM

## 2012-11-02 DIAGNOSIS — I10 Essential (primary) hypertension: Secondary | ICD-10-CM | POA: Diagnosis present

## 2012-11-02 DIAGNOSIS — F3289 Other specified depressive episodes: Secondary | ICD-10-CM

## 2012-11-02 DIAGNOSIS — F329 Major depressive disorder, single episode, unspecified: Secondary | ICD-10-CM

## 2012-11-02 DIAGNOSIS — D62 Acute posthemorrhagic anemia: Secondary | ICD-10-CM

## 2012-11-02 DIAGNOSIS — S52532K Colles' fracture of left radius, subsequent encounter for closed fracture with nonunion: Secondary | ICD-10-CM

## 2012-11-02 HISTORY — DX: Fracture of unspecified carpal bone, unspecified wrist, initial encounter for closed fracture: S62.109A

## 2012-11-02 LAB — CBC WITH DIFFERENTIAL/PLATELET
Basophils Absolute: 0 10*3/uL (ref 0.0–0.1)
Eosinophils Absolute: 0.2 10*3/uL (ref 0.0–0.7)
Eosinophils Relative: 4 % (ref 0–5)
MCH: 28.5 pg (ref 26.0–34.0)
MCV: 82 fL (ref 78.0–100.0)
Neutrophils Relative %: 68 % (ref 43–77)
Platelets: 344 10*3/uL (ref 150–400)
RBC: 4 MIL/uL (ref 3.87–5.11)
RDW: 14.1 % (ref 11.5–15.5)
WBC: 5.5 10*3/uL (ref 4.0–10.5)

## 2012-11-02 LAB — BASIC METABOLIC PANEL
BUN: 10 mg/dL (ref 6–23)
Chloride: 92 mEq/L — ABNORMAL LOW (ref 96–112)
GFR calc Af Amer: >90 mL/min (ref >90)
GFR calc non Af Amer: >90 mL/min (ref >90)
Potassium: 4 mEq/L (ref 3.5–5.1)
Sodium: 127 mEq/L — ABNORMAL LOW (ref 135–145)

## 2012-11-02 LAB — PROTIME-INR
INR: 4 — ABNORMAL HIGH (ref 0.00–1.49)
Prothrombin Time: 36.6 seconds — ABNORMAL HIGH (ref 11.6–15.2)

## 2012-11-02 LAB — URINALYSIS, ROUTINE W REFLEX MICROSCOPIC
Glucose, UA: NEGATIVE mg/dL
Hgb urine dipstick: NEGATIVE
Ketones, ur: NEGATIVE mg/dL
Protein, ur: NEGATIVE mg/dL
Urobilinogen, UA: 1 mg/dL (ref 0.0–1.0)

## 2012-11-02 LAB — RETICULOCYTES
RBC.: 4.1 MIL/uL (ref 3.87–5.11)
Retic Count, Absolute: 65.6 10*3/uL (ref 19.0–186.0)
Retic Ct Pct: 1.6 % (ref 0.4–3.1)

## 2012-11-02 MED ORDER — ARTIFICIAL TEARS OP OINT
TOPICAL_OINTMENT | Freq: Every day | OPHTHALMIC | Status: DC
  Administered 2012-11-02 – 2012-11-04 (×3): via OPHTHALMIC
  Filled 2012-11-02: qty 3.5

## 2012-11-02 MED ORDER — LABETALOL HCL 300 MG PO TABS
600.0000 mg | ORAL_TABLET | Freq: Two times a day (BID) | ORAL | Status: DC
  Administered 2012-11-02 – 2012-11-05 (×6): 600 mg via ORAL
  Filled 2012-11-02 (×7): qty 2

## 2012-11-02 MED ORDER — VENLAFAXINE HCL ER 150 MG PO CP24
150.0000 mg | ORAL_CAPSULE | Freq: Every morning | ORAL | Status: DC
  Administered 2012-11-03 – 2012-11-05 (×3): 150 mg via ORAL
  Filled 2012-11-02 (×3): qty 1

## 2012-11-02 MED ORDER — MORPHINE SULFATE 2 MG/ML IJ SOLN
1.0000 mg | INTRAMUSCULAR | Status: DC | PRN
  Administered 2012-11-03: 1 mg via INTRAVENOUS
  Filled 2012-11-02: qty 1

## 2012-11-02 MED ORDER — FENTANYL CITRATE 0.05 MG/ML IJ SOLN: 50.0000 ug | INTRAMUSCULAR | Status: DC | PRN

## 2012-11-02 MED ORDER — CARBOXYMETHYLCELL-HYPROMELLOSE 0.25-0.3 % OP GEL: 1.0000 "application " | Freq: Every evening | OPHTHALMIC | Status: DC

## 2012-11-02 MED ORDER — HYDROCODONE-ACETAMINOPHEN 5-325 MG PO TABS
1.0000 | ORAL_TABLET | ORAL | Status: DC | PRN
  Administered 2012-11-03: 2 via ORAL
  Administered 2012-11-03 (×2): 1 via ORAL
  Filled 2012-11-02: qty 2
  Filled 2012-11-02 (×3): qty 1

## 2012-11-02 MED ORDER — SODIUM CHLORIDE 0.9 % IV SOLN
20.0000 mL | INTRAVENOUS | Status: DC
  Administered 2012-11-02: 20 mL via INTRAVENOUS

## 2012-11-02 MED ORDER — SODIUM CHLORIDE 0.9 % IV SOLN
INTRAVENOUS | Status: DC
  Administered 2012-11-02: 20:00:00 via INTRAVENOUS

## 2012-11-02 MED ORDER — AMLODIPINE BESYLATE 5 MG PO TABS
5.0000 mg | ORAL_TABLET | Freq: Every day | ORAL | Status: DC
  Administered 2012-11-02 – 2012-11-04 (×2): 5 mg via ORAL
  Filled 2012-11-02 (×4): qty 1

## 2012-11-02 MED ORDER — ONDANSETRON HCL 4 MG/2ML IJ SOLN: 4.0000 mg | Freq: Once | INTRAMUSCULAR | Status: DC

## 2012-11-02 MED ORDER — VITAMIN K1 10 MG/ML IJ SOLN
10.0000 mg | INTRAMUSCULAR | Status: AC
  Administered 2012-11-02: 10 mg via INTRAVENOUS
  Filled 2012-11-02: qty 1

## 2012-11-02 NOTE — ED Provider Notes (Signed)
History     CSN: [id number]  Arrival date & time 11/02/12  1634   First MD Initiated Contact with Patient 11/02/12 1715      Chief Complaint  Patient presents with  . Hip Injury     Patient is a 72 y.o. female presenting with hip pain. The history is provided by the patient.  Hip Pain This is a new problem. The current episode started more than 2 days ago. The problem has been gradually worsening. Pertinent negatives include no chest pain and no shortness of breath. The symptoms are aggravated by walking. The symptoms are relieved by rest. She has tried rest for the symptoms. The treatment provided mild relief.    Past Medical History  Diagnosis Date  . Depression   . Diarrhea   . 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 Hypertension   . Broken wrist     Past Surgical History  Procedure Laterality Date  . Mitral valve replacement      Pt. is on coumadin.  . Shoulder open rotator cuff repair      Family History  Problem Relation Age of Onset  . Stroke Mother   . Colon cancer Paternal Grandmother     History  Substance Use Topics  . Smoking status: Never Smoker   . Smokeless tobacco: Never Used  . Alcohol Use: 4.2 oz/week    7 Glasses of wine per week    OB History   Grav Para Term Preterm Abortions TAB SAB Ect Mult Living                  Review of Systems  Constitutional: Negative for fever.  HENT: Negative for neck pain.   Respiratory: Negative for shortness of breath.   Cardiovascular: Negative for chest pain.  Gastrointestinal: Negative for vomiting and diarrhea.  Musculoskeletal: Negative for back pain.  Neurological: Negative for weakness.  All other systems reviewed and are negative.    Allergies  Review of patient's allergies indicates no known allergies.  Home Medications   Current Outpatient Rx  Name  Route  Sig  Dispense  Refill  . acetaminophen (TYLENOL) 500 MG tablet   Oral   Take 1,500 mg by mouth every 6 (six) hours as  needed for pain.         Marland Kitchen amLODipine (NORVASC) 5 MG tablet   Oral   Take 5 mg by mouth every evening.         . Carboxymethylcell-Hypromellose (GENTEAL) 0.25-0.3 % GEL   Ophthalmic   Apply 1 application to eye every evening.         . fish oil-omega-3 fatty acids 1000 MG capsule   Oral   Take 2 g by mouth 2 (two) times daily.          Marland Kitchen labetalol (NORMODYNE) 300 MG tablet   Oral   Take 600 mg by mouth 2 (two) times daily.         . Multiple Vitamin (MULTIVITAMIN WITH MINERALS) TABS   Oral   Take 1 tablet by mouth daily.         Marland Kitchen oxyCODONE-acetaminophen (PERCOCET/ROXICET) 5-325 MG per tablet   Oral   Take 1 tablet by mouth every 4 (four) hours as needed for pain.   30 tablet   0   . venlafaxine XR (EFFEXOR-XR) 150 MG 24 hr capsule   Oral   Take 150 mg by mouth every morning.          Marland Kitchen  warfarin (COUMADIN) 10 MG tablet   Oral   Take 10 mg by mouth See admin instructions. Only on Tuesday evenings         . warfarin (COUMADIN) 7.5 MG tablet   Oral   Take 7.5 mg by mouth as directed. Take every night except Tuesday           BP 202/72  Pulse 81  Temp(Src) 97.8 F (36.6 C) (Oral)  SpO2 98%  Physical Exam CONSTITUTIONAL: Well developed/well nourished HEAD: Normocephalic/atraumatic EYES: EOMI/PERRL ENMT: Mucous membranes moist NECK: supple no meningeal signs SPINE:no cervical spine tenderness CV: murmur noted LUNGS: Lungs are clear to auscultation bilaterally, no apparent distress ABDOMEN: soft, nontender, no rebound or guarding NEURO: Pt is awake/alert, moves all extremitiesx4 EXTREMITIES: pulses normal Left UE in splint, no sensory deficit to fingers Tenderness with movement of left hip All extremities/joints palpated/ranged and nontender SKIN: warm, color normal PSYCH: no abnormalities of mood noted  ED Course  Procedures   Labs Reviewed  CBC WITH DIFFERENTIAL - Abnormal; Notable for the following:    Hemoglobin 11.4 (*)    HCT  32.8 (*)    All other components within normal limits  BASIC METABOLIC PANEL  PROTIME-INR  TYPE AND SCREEN   Dg Chest Port 1 View  11/02/2012   *RADIOLOGY REPORT*  Clinical Data: Left wrist and hip fractures.  Preoperative respiratory exam.  PORTABLE CHEST - 1 VIEW  Comparison: None.  Findings: The patient is status post prior median sternotomy. Heart size and mediastinal contours are within normal limits. Lungs show no evidence of edema, consolidation or significant pleural fluid.  Scarring and minimal subsegmental atelectasis is present at both lung bases.  The visualized bony thorax is unremarkable.  IMPRESSION: No active disease.   Original Report Authenticated By: Irish Lack, M.D.   6:48 PM Pt with recent fall on 10/29/12 and has persistent pain in left hip She has been ambulating Seen in ortho office today for left distal radius fx.  She had left hip xray today that showed hip fracture She is currently stable She is on coumadin for previous heart valve surgery I spoke to dr Netta Corrigan, will perform surgery tomorrow Awaiting call back from [facility]     MDM  Nursing notes including past medical history and social history reviewed and considered in documentation xrays reviewed and considered Labs/vital reviewed and considered Previous records reviewed and considered - recent ED visit reviewed        Date: 11/02/2012  Rate: 75  Rhythm: normal sinus rhythm  QRS Axis: right  Intervals: normal  ST/T Wave abnormalities: nonspecific ST changes  Conduction Disutrbances:none  Narrative Interpretation:   Old EKG Reviewed: unchanged    Joya Gaskins, MD 11/02/12 1850

## 2012-11-02 NOTE — H&P (Signed)
Hospitalist Admission History and Physical  Patient name: Jodi Frank Medical record number: [id number] Date of birth: 06-Jan-1941 Age: 72 y.o. Gender: female  Primary Care Provider: CASSIDY-VU,LISA, MD Cardiology: [facility]   Chief Complaint: Left hip fracture  History of Present Illness:This is a 72 y.o. 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 Patient noted have been seen in the ER about 2-3 days ago for a fall. Patient had a left distal radius fracture at the time. Patient states she had some mild hip pain time however was fairly insignificant. A x-ray was then obtained at the time. Patient has followup with [facility] orthopedics today about her left wrist when she complained about her left hip. A left hip x-ray was obtained showing an impacted left hip fracture. Patient was sent to the ER for admission as plan is for surgery for hip fracture. Patient noted to be on Coumadin for mechanical mitral valve. Patient denies any bleeding, weakness, excessive bruising. Patient denies any headache, dizziness, shortness of breath, chest pain prior to the fall. Patient states that she fell on an object leg night which was initial cause for the fall to 3 days ago.  Patient Active Problem List   Diagnosis Date Noted  . DIARRHEA 01/30/2010  . DEPRESSION 12/08/2006  . ALLERGIC RHINITIS 12/08/2006   Past Medical History: Past Medical History  Diagnosis Date  . Depression   . Diarrhea   . 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 Hypertension   . Broken wrist     Past Surgical History: Past Surgical History  Procedure Laterality Date  . Mitral valve replacement      Pt. is on coumadin.  . Shoulder open rotator cuff repair      Social History: History   Social History  . Marital Status: Divorced    Spouse Name: N/A    Number of Children: N/A  . Years of  Education: N/A   Social History Main Topics  . Smoking status: Never Smoker   . Smokeless tobacco: Never Used  . Alcohol Use: 4.2 oz/week    7 Glasses of wine per week  . Drug Use: No  . Sexually Active: None   Other Topics Concern  . None   Social History Narrative  . None    Family History: Family History  Problem Relation Age of Onset  . Stroke Mother   . Colon cancer Paternal Grandmother     Allergies: No Known Allergies  Current Facility-Administered Medications  Medication Dose Route Frequency Provider Last Rate Last Dose  . 0.9 %  sodium chloride infusion  500 mL Intravenous Continuous Meryl Dare, MD      . 0.9 %  sodium chloride infusion  20 mL Intravenous Continuous Joya Gaskins, MD 50 mL/hr at 11/02/12 1746 20 mL at 11/02/12 1746  . 0.9 %  sodium chloride infusion   Intravenous Continuous Doree Albee, MD      . fentaNYL (SUBLIMAZE) injection 50 mcg  50 mcg Intravenous Q30 min PRN Joya Gaskins, MD      . HYDROcodone-acetaminophen (NORCO/VICODIN) 5-325 MG per tablet 1-2 tablet  1-2 tablet Oral Q4H PRN Doree Albee, MD      . morphine 2 MG/ML injection 1-2 mg  1-2 mg Intravenous Q2H PRN Doree Albee, MD      . ondansetron [facility]) injection 4 mg  4 mg Intravenous  Once Joya Gaskins, MD       Current Outpatient Prescriptions  Medication Sig Dispense Refill  . acetaminophen (TYLENOL) 500 MG tablet Take 1,500 mg by mouth every 6 (six) hours as needed for pain.      Marland Kitchen amLODipine (NORVASC) 5 MG tablet Take 5 mg by mouth every evening.      . Carboxymethylcell-Hypromellose (GENTEAL) 0.25-0.3 % GEL Apply 1 application to eye every evening.      . fish oil-omega-3 fatty acids 1000 MG capsule Take 2 g by mouth 2 (two) times daily.       Marland Kitchen labetalol (NORMODYNE) 300 MG tablet Take 600 mg by mouth 2 (two) times daily.      . Multiple Vitamin (MULTIVITAMIN WITH MINERALS) TABS Take 1 tablet by mouth daily.      Marland Kitchen oxyCODONE-acetaminophen (PERCOCET/ROXICET)  5-325 MG per tablet Take 1 tablet by mouth every 4 (four) hours as needed for pain.  30 tablet  0  . venlafaxine XR (EFFEXOR-XR) 150 MG 24 hr capsule Take 150 mg by mouth every morning.       . warfarin (COUMADIN) 10 MG tablet Take 10 mg by mouth See admin instructions. Only on Tuesday evenings      . warfarin (COUMADIN) 7.5 MG tablet Take 7.5 mg by mouth as directed. Take every night except Tuesday       Review Of Systems: 12 point ROS negative except as noted above in HPI.  Physical Exam: Filed Vitals:   11/02/12 1711  BP: 202/72  Pulse: 81  Temp: 97.8 F (36.6 C)    General: alert and cooperative HEENT: PERRLA, extra ocular movement intact and mildly dry oral mucous membranes  Heart: S1, S2 normal, no murmur, rub or gallop, regular rate and rhythm Lungs: clear to auscultation, no wheezes or rales and unlabored breathing Abdomen: abdomen is soft without significant tenderness, masses, organomegaly or guarding Extremities: mild L hip TTP, L forearm/wrist in splint, neurovascularly intact distally  Skin:no rashes, no ecchymoses Neurology: normal without focal findings, mental status, speech normal, alert and oriented x3, PERLA and reflexes normal and symmetric  Labs and Imaging: Lab Results  Component Value Date/Time   NA 127* 11/02/2012  5:20 PM   K 4.0 11/02/2012  5:20 PM   CL 92* 11/02/2012  5:20 PM   CO2 23 11/02/2012  5:20 PM   BUN 10 11/02/2012  5:20 PM   CREATININE 0.45* 11/02/2012  5:20 PM   GLUCOSE 100* 11/02/2012  5:20 PM   Lab Results  Component Value Date   WBC 5.5 11/02/2012   HGB 11.4* 11/02/2012   HCT 32.8* 11/02/2012   MCV 82.0 11/02/2012   PLT 344 11/02/2012    Dg Chest Port 1 View  11/02/2012   *RADIOLOGY REPORT*  Clinical Data: Left wrist and hip fractures.  Preoperative respiratory exam.  PORTABLE CHEST - 1 VIEW  Comparison: None.  Findings: The patient is status post prior median sternotomy. Heart size and mediastinal contours are within normal limits. Lungs  show no evidence of edema, consolidation or significant pleural fluid.  Scarring and minimal subsegmental atelectasis is present at both lung bases.  The visualized bony thorax is unremarkable.  IMPRESSION: No active disease.   Original Report Authenticated By: Irish Lack, M.D.    EKG: NSR   Assessment and Plan: Jodi Frank is a 72 y.o. year old female presenting with L hip fracture   L hip fracture:  [facility] is aware of case.  Plan is  for OR tomorrow pending normalization of INR Percocet and IV morphine for pain control.   HTN: Elevated BP in setting of hip fracture. No cardiac complaints. Continue home meds. Titrate meds as clinically indicated.   Mitral Valve Replacement:  Followed by [facility]. No cardiac complaints currently. No other known cardiac issues.  INR pending. No signs of active bleeding. Hgb stable from recent ER visit. Heparin per pharmacy pending OR.  Consider cards consult if INR stablization is protracted.     Hyponatremia: Pt states that this is a chronic issue. Mildly dry on exam. Not on diuretic. Will gently hydrate with normal saline. Check serum Osm. Recheck in am.    FEN/GI: NPO PMN.  Prophylaxis: heparin per pharmacy  Disposition: pending further evaluation  Code Status:DNR        Doree Albee MD  Pager: [phone]

## 2012-11-02 NOTE — Progress Notes (Signed)
Pharmacy: Brief Anticoag Note  INR 4 Verbal order received from Dr. Darrelyn Hillock for vitamin K 10 mg IV x 1 tonight, Repeat INR with AM labs and again at noon on 6/1.  Also to discontinue heparin per pharmacy.   Clydene Fake PharmD Pager #: [phone] 9:24 PM 11/02/2012

## 2012-11-02 NOTE — Consult Note (Signed)
Reason for Consult:Hip Fracture on THe Left. Referring Physician: Dr. Ortman  Jodi Frank is an 72 y.o. female.  HPI: Patient fell last Friday and sustained a Colles Fracture on the Left and a Femoral Neck Fracture.  Past Medical History  Diagnosis Date  . Depression   . Diarrhea   . 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 Hypertension   . Broken wrist     Past Surgical History  Procedure Laterality Date  . Mitral valve replacement      Pt. is on coumadin.  . Shoulder open rotator cuff repair      Family History  Problem Relation Age of Onset  . Stroke Mother   . Colon cancer Paternal Grandmother     Social History:  reports that she has never smoked. She has never used smokeless tobacco. She reports that she drinks about 4.2 ounces of alcohol per week. She reports that she does not use illicit drugs.  Allergies: No Known Allergies  Medications: I have reviewed the patient's current medications.  Results for orders placed during the hospital encounter of 11/02/12 (from the past 48 hour(s))  BASIC METABOLIC PANEL     Status: Abnormal   Collection Time    11/02/12  5:20 PM      Result Value Range   Sodium 127 (*) 135 - 145 mEq/L   Potassium 4.0  3.5 - 5.1 mEq/L   Chloride 92 (*) 96 - 112 mEq/L   CO2 23  19 - 32 mEq/L   Glucose, Bld 100 (*) 70 - 99 mg/dL   BUN 10  6 - 23 mg/dL   Creatinine, Ser 0.45 (*) 0.50 - 1.10 mg/dL   Calcium 9.6  8.4 - 10.5 mg/dL   GFR calc non Af Amer >90  >90 mL/min   GFR calc Af Amer >90  >90 mL/min   Comment:            The eGFR has been calculated     using the CKD EPI equation.     This calculation has not been     validated in all clinical     situations.     eGFR's persistently     <90 mL/min signify     possible Chronic Kidney Disease.  CBC WITH DIFFERENTIAL     Status: Abnormal   Collection Time    11/02/12  5:20 PM      Result Value Range   WBC 5.5  4.0 - 10.5 K/uL   RBC 4.00  3.87 - 5.11 MIL/uL   Hemoglobin 11.4  (*) 12.0 - 15.0 g/dL   HCT 32.8 (*) 36.0 - 46.0 %   MCV 82.0  78.0 - 100.0 fL   MCH 28.5  26.0 - 34.0 pg   MCHC 34.8  30.0 - 36.0 g/dL   RDW 14.1  11.5 - 15.5 %   Platelets 344  150 - 400 K/uL   Neutrophils Relative % 68  43 - 77 %   Neutro Abs 3.7  1.7 - 7.7 K/uL   Lymphocytes Relative 15  12 - 46 %   Lymphs Abs 0.9  0.7 - 4.0 K/uL   Monocytes Relative 12  3 - 12 %   Monocytes Absolute 0.7  0.1 - 1.0 K/uL   Eosinophils Relative 4  0 - 5 %   Eosinophils Absolute 0.2  0.0 - 0.7 K/uL   Basophils Relative 1  0 - 1 %   Basophils Absolute   0.0  0.0 - 0.1 K/uL  PROTIME-INR     Status: Abnormal   Collection Time    11/02/12  5:20 PM      Result Value Range   Prothrombin Time 36.6 (*) 11.6 - 15.2 seconds   INR 4.00 (*) 0.00 - 1.49  TYPE AND SCREEN     Status: None   Collection Time    11/02/12  5:20 PM      Result Value Range   ABO/RH(D) O POS     Antibody Screen NEG     Sample Expiration 11/05/2012    RETICULOCYTES     Status: None   Collection Time    11/02/12  7:40 PM      Result Value Range   Retic Ct Pct 1.6  0.4 - 3.1 %   RBC. 4.10  3.87 - 5.11 MIL/uL   Retic Count, Manual 65.6  19.0 - 186.0 K/uL  ABO/RH     Status: None   Collection Time    11/02/12  7:50 PM      Result Value Range   ABO/RH(D) O POS    URINALYSIS, ROUTINE W REFLEX MICROSCOPIC     Status: Abnormal   Collection Time    11/02/12  7:51 PM      Result Value Range   Color, Urine YELLOW  YELLOW   APPearance CLEAR  CLEAR   Specific Gravity, Urine 1.012  1.005 - 1.030   pH 6.5  5.0 - 8.0   Glucose, UA NEGATIVE  NEGATIVE mg/dL   Hgb urine dipstick NEGATIVE  NEGATIVE   Bilirubin Urine NEGATIVE  NEGATIVE   Ketones, ur NEGATIVE  NEGATIVE mg/dL   Protein, ur NEGATIVE  NEGATIVE mg/dL   Urobilinogen, UA 1.0  0.0 - 1.0 mg/dL   Nitrite NEGATIVE  NEGATIVE   Leukocytes, UA TRACE (*) NEGATIVE  URINE MICROSCOPIC-ADD ON     Status: None   Collection Time    11/02/12  7:51 PM      Result Value Range   WBC, UA 0-2   <3 WBC/hpf    Dg Chest Port 1 View  11/02/2012   *RADIOLOGY REPORT*  Clinical Data: Left wrist and hip fractures.  Preoperative respiratory exam.  PORTABLE CHEST - 1 VIEW  Comparison: None.  Findings: The patient is status post prior median sternotomy. Heart size and mediastinal contours are within normal limits. Lungs show no evidence of edema, consolidation or significant pleural fluid.  Scarring and minimal subsegmental atelectasis is present at both lung bases.  The visualized bony thorax is unremarkable.  IMPRESSION: No active disease.   Original Report Authenticated By: Glenn Yamagata, M.D.    Review of Systems  Constitutional: Negative.   HENT: Negative.   Eyes: Negative.   Respiratory: Negative.   Cardiovascular: Negative.   Gastrointestinal: Negative.   Genitourinary: Negative.   Musculoskeletal: Positive for joint pain.  Skin: Negative.   Neurological: Negative.   Endo/Heme/Allergies: Negative.   Psychiatric/Behavioral: Negative.    Blood pressure 146/65, pulse 67, temperature 98.3 F (36.8 C), temperature source Oral, resp. rate 16, SpO2 94.00%. Physical Exam  Constitutional: She appears well-developed.  HENT:  Head: Normocephalic.  Eyes: Pupils are equal, round, and reactive to light.  Neck: Normal range of motion.  Cardiovascular: Normal rate.   Mechanical HeartValve with a audible Click.  Respiratory: Effort normal.  GI: Soft.  Musculoskeletal:  Fracture Radius and Impacted,Valgus Left Hip Fracture.  Neurological: She is alert.  Skin: Skin is warm.  Psychiatric: She has   a normal mood and affect.    Assessment/Plan: HIp Pinning Tomorrow and Vitamin K to Reduce her INR per Pharmacy  [name] A 11/02/2012, 9:17 PM      

## 2012-11-02 NOTE — Progress Notes (Signed)
Choices offered   HOME HEALTH AGENCIES SERVING [place]   Agencies that are Medicare-Certified and are affiliated with [facility]  Telephone Number Address  [facility]   [facility] has ownership interest in this company; however, you are under no obligation to use this agency. [phone] or  [phone] [address] [place], [place] [address] [email or web address]   Agencies that are Medicare-Certified and are not affiliated with [facility]                                                                                 Home Health Agency Telephone Number Address  [facility] [phone] Fax [phone] [address], [address] [place], [place]  [address] [email or web address]  [facility] [phone] or [phone] Fax [phone] [address] [place], [place] [address] [email or web address]  [facility] [phone] Fax [phone] [address] [place], [place] [address] [email or web address]  [facility] [phone] Fax [phone] [address], [address] [place], [place]  [address] [email or web address]  [facility] [phone] Fax [phone] [address], [address] [place], [place] [address] [email or web address]  [facility] [phone] [address] [place], [place] [address] [email or web address]  [facility] [phone]  [address] [place], [place] [address] [email or web address]  [facility] [phone] or [phone] Fax number [phone] [address] [place], [place]  [address] [email or web address]  [facility] [phone] Fax [phone] [address] [place], [place]   [address]  [facility]  [phone] Fax [phone] [address] [place], [place] [address] [email or web address]   

## 2012-11-02 NOTE — ED Notes (Signed)
Pt states that she fell 5 days ago had a broken L wrist. States she was seeing Dr. Orlan Leavens today when she stated that her L hip was hurting as well. Pt was Xrayed and saw that she had a broken hip as well. Dr. Ottie Glazier sent her here and stated that she would need surgery tonight. Pt has been walking on the hip since Friday. Hypertensive at this time. Takes half her meds in the morning for BP and half at night.

## 2012-11-02 NOTE — Progress Notes (Signed)
ANTICOAGULATION CONSULT NOTE - Initial Consult  Pharmacy Consult for IV Heparin  Indication: Mechanical Mitral Valve  No Known Allergies  Patient Measurements:   Heparin Dosing Weight:  Vital Signs: Temp: 97.8 F (36.6 C) (06/10 1711) Temp src: Oral (06/10 1711) BP: 196/70 mmHg (06/10 1938) Pulse Rate: 80 (06/10 1939)  Labs:  Recent Labs  11/02/12 1720  HGB 11.4*  HCT 32.8*  PLT 344  CREATININE 0.45*    The CrCl is unknown because both a height and weight (above a minimum accepted value) are required for this calculation.   Medical History: Past Medical History  Diagnosis Date  . Depression   . Diarrhea   . 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 Hypertension   . Broken wrist     Medications:   (Not in a hospital admission) Scheduled:  . amLODipine  5 mg Oral q1800  . artificial tears   Both Eyes QHS  . labetalol  600 mg Oral BID  . ondansetron (ZOFRAN) IV  4 mg Intravenous Once  . [START ON 11/03/2012] venlafaxine XR  150 mg Oral q morning - 10a   Infusions:  . sodium chloride    . sodium chloride 20 mL (11/02/12 1746)  . sodium chloride     PRN: fentaNYL, HYDROcodone-acetaminophen, morphine injection Anti-infectives   None      Assessment:  72 yo F on chronic anticoagulation with warfarin for mechanic mitral valve replacement. She presents with a Left Hip fracture s/p mechanical fall with plans for orthopedic surgery in AM.  Pharmacy asked to dose IV heparin to bridge prior to surgery while off warfarin.   Warfarin dose PTA = 7.5 mg daily except on tuesdays take 10 mg; Last dose PTA 6/9  INR on admission = 4.   will f/u plan with MD   Goal of Therapy:  INR 2-3 Heparin level 0.3-0.7 units/ml Monitor platelets by anticoagulation protocol: Yes   Plan:  1.) INR on admission supratherapeutic - f/u anticoagulation plan with MD.  Will hold off on starting IV heparin for now 2.) RN has been notified we will wait on IV heparin until  further orders from MD.   Clydene Fake PharmD Pager #: [phone] 8:42 PM 11/02/2012

## 2012-11-02 NOTE — Progress Notes (Signed)
ED CM noted Cm consult for possible home health needs at d/c. Spoke with pt briefly. CNAs present at bedside to insert foley Left [facility] [place] [facility] agencies at bedside for pt

## 2012-11-03 ENCOUNTER — Inpatient Hospital Stay (HOSPITAL_COMMUNITY): Payer: PRIVATE HEALTH INSURANCE | Admitting: Anesthesiology

## 2012-11-03 ENCOUNTER — Encounter (HOSPITAL_COMMUNITY): Payer: Self-pay | Admitting: Anesthesiology

## 2012-11-03 ENCOUNTER — Inpatient Hospital Stay (HOSPITAL_COMMUNITY): Payer: PRIVATE HEALTH INSURANCE

## 2012-11-03 ENCOUNTER — Ambulatory Visit: Admit: 2012-11-03 | Payer: Self-pay | Admitting: Orthopedic Surgery

## 2012-11-03 ENCOUNTER — Encounter (HOSPITAL_COMMUNITY): Admission: EM | Disposition: A | Discharge status: Skilled Nursing Facility | Payer: Self-pay | Source: Home / Self Care

## 2012-11-03 ENCOUNTER — Encounter (HOSPITAL_COMMUNITY): Payer: Self-pay | Admitting: Certified Registered Nurse Anesthetist

## 2012-11-03 DIAGNOSIS — IMO0002 Reserved for concepts with insufficient information to code with codable children: Secondary | ICD-10-CM

## 2012-11-03 DIAGNOSIS — E871 Hypo-osmolality and hyponatremia: Secondary | ICD-10-CM

## 2012-11-03 HISTORY — PX: HIP PINNING,CANNULATED: SHX1758

## 2012-11-03 LAB — IRON AND TIBC
Iron: 25 ug/dL — ABNORMAL LOW (ref 42–135)
Saturation Ratios: 8 % — ABNORMAL LOW (ref 20–55)
TIBC: 330 ug/dL (ref 250–470)
UIBC: 305 ug/dL (ref 125–400)

## 2012-11-03 LAB — SURGICAL PCR SCREEN: Staphylococcus aureus: NEGATIVE

## 2012-11-03 LAB — PROTIME-INR
INR: 1.39 (ref 0.00–1.49)
Prothrombin Time: 16.7 s — ABNORMAL HIGH (ref 11.6–15.2)

## 2012-11-03 LAB — FERRITIN: Ferritin: 272 ng/mL (ref 10–291)

## 2012-11-03 LAB — VITAMIN B12: Vitamin B-12: 1236 pg/mL — ABNORMAL HIGH (ref 211–911)

## 2012-11-03 SURGERY — FIXATION, FEMUR, NECK, PERCUTANEOUS, USING SCREW
Anesthesia: General | Site: Hip | Laterality: Left | Wound class: Clean

## 2012-11-03 MED ORDER — CEFAZOLIN SODIUM-DEXTROSE 2-3 GM-% IV SOLR
INTRAVENOUS | Status: DC | PRN
  Administered 2012-11-03: 2 g via INTRAVENOUS

## 2012-11-03 MED ORDER — PHENOL 1.4 % MT LIQD: 1.0000 | OROMUCOSAL | Status: DC | PRN

## 2012-11-03 MED ORDER — ONDANSETRON HCL 4 MG PO TABS: 4.0000 mg | ORAL_TABLET | Freq: Four times a day (QID) | ORAL | Status: DC | PRN

## 2012-11-03 MED ORDER — SODIUM CHLORIDE 0.9 % IR SOLN
Status: DC | PRN
  Administered 2012-11-03: 18:00:00

## 2012-11-03 MED ORDER — ZOLPIDEM TARTRATE 5 MG PO TABS
5.0000 mg | ORAL_TABLET | Freq: Every evening | ORAL | Status: DC | PRN
  Administered 2012-11-03 – 2012-11-04 (×2): 5 mg via ORAL
  Filled 2012-11-03 (×2): qty 1

## 2012-11-03 MED ORDER — DEXAMETHASONE SODIUM PHOSPHATE 10 MG/ML IJ SOLN
INTRAMUSCULAR | Status: DC | PRN
  Administered 2012-11-03: 10 mg via INTRAVENOUS

## 2012-11-03 MED ORDER — ONDANSETRON HCL 4 MG/2ML IJ SOLN
INTRAMUSCULAR | Status: DC | PRN
  Administered 2012-11-03: 4 mg via INTRAVENOUS

## 2012-11-03 MED ORDER — CEFAZOLIN SODIUM-DEXTROSE 2-3 GM-% IV SOLR
2.0000 g | Freq: Once | INTRAVENOUS | Status: AC
  Administered 2012-11-03: 2 g via INTRAVENOUS
  Filled 2012-11-03: qty 50

## 2012-11-03 MED ORDER — METHOCARBAMOL 500 MG PO TABS
500.0000 mg | ORAL_TABLET | Freq: Four times a day (QID) | ORAL | Status: DC | PRN
  Administered 2012-11-04 – 2012-11-05 (×5): 500 mg via ORAL
  Filled 2012-11-03 (×5): qty 1

## 2012-11-03 MED ORDER — METHOCARBAMOL 100 MG/ML IJ SOLN
500.0000 mg | Freq: Four times a day (QID) | INTRAVENOUS | Status: DC | PRN
  Administered 2012-11-03: 500 mg via INTRAVENOUS
  Filled 2012-11-03: qty 5

## 2012-11-03 MED ORDER — FENTANYL CITRATE 0.05 MG/ML IJ SOLN
INTRAMUSCULAR | Status: DC | PRN
  Administered 2012-11-03 (×2): 50 ug via INTRAVENOUS
  Administered 2012-11-03: 100 ug via INTRAVENOUS

## 2012-11-03 MED ORDER — FLEET ENEMA 7-19 GM/118ML RE ENEM: 1.0000 | ENEMA | Freq: Once | RECTAL | Status: AC | PRN

## 2012-11-03 MED ORDER — MENTHOL 3 MG MT LOZG
1.0000 | LOZENGE | OROMUCOSAL | Status: DC | PRN
  Filled 2012-11-03: qty 9

## 2012-11-03 MED ORDER — PROPOFOL 10 MG/ML IV BOLUS
INTRAVENOUS | Status: DC | PRN
  Administered 2012-11-03: 140 mg via INTRAVENOUS

## 2012-11-03 MED ORDER — HEPARIN (PORCINE) IN NACL 100-0.45 UNIT/ML-% IJ SOLN
1000.0000 [IU]/h | INTRAMUSCULAR | Status: DC
  Administered 2012-11-04: 1000 [IU]/h via INTRAVENOUS
  Filled 2012-11-03: qty 250

## 2012-11-03 MED ORDER — BUPIVACAINE LIPOSOME 1.3 % IJ SUSP
20.0000 mL | Freq: Once | INTRAMUSCULAR | Status: DC
  Filled 2012-11-03: qty 20

## 2012-11-03 MED ORDER — HYDROMORPHONE HCL PF 1 MG/ML IJ SOLN: 0.1000 mg | INTRAMUSCULAR | Status: DC | PRN

## 2012-11-03 MED ORDER — HYDRALAZINE HCL 20 MG/ML IJ SOLN: 10.0000 mg | Freq: Four times a day (QID) | INTRAMUSCULAR | Status: DC | PRN

## 2012-11-03 MED ORDER — SUCCINYLCHOLINE CHLORIDE 20 MG/ML IJ SOLN
INTRAMUSCULAR | Status: DC | PRN
  Administered 2012-11-03: 100 mg via INTRAVENOUS

## 2012-11-03 MED ORDER — ALUM & MAG HYDROXIDE-SIMETH 200-200-20 MG/5ML PO SUSP: 30.0000 mL | ORAL | Status: DC | PRN

## 2012-11-03 MED ORDER — BACITRACIN ZINC 500 UNIT/GM EX OINT: TOPICAL_OINTMENT | CUTANEOUS | Status: DC | PRN

## 2012-11-03 MED ORDER — POLYETHYLENE GLYCOL 3350 17 G PO PACK
17.0000 g | PACK | Freq: Every day | ORAL | Status: DC | PRN
  Administered 2012-11-05: 17 g via ORAL

## 2012-11-03 MED ORDER — PROMETHAZINE HCL 25 MG/ML IJ SOLN: 6.2500 mg | INTRAMUSCULAR | Status: DC | PRN

## 2012-11-03 MED ORDER — ACETAMINOPHEN 10 MG/ML IV SOLN
INTRAVENOUS | Status: DC | PRN
  Administered 2012-11-03: 1000 mg via INTRAVENOUS

## 2012-11-03 MED ORDER — ONDANSETRON HCL 4 MG/2ML IJ SOLN: 4.0000 mg | Freq: Four times a day (QID) | INTRAMUSCULAR | Status: DC | PRN

## 2012-11-03 MED ORDER — CEFAZOLIN SODIUM-DEXTROSE 2-3 GM-% IV SOLR
2.0000 g | Freq: Four times a day (QID) | INTRAVENOUS | Status: AC
  Administered 2012-11-03 – 2012-11-04 (×2): 2 g via INTRAVENOUS
  Filled 2012-11-03 (×2): qty 50

## 2012-11-03 MED ORDER — SODIUM CHLORIDE 0.9 % IJ SOLN
INTRAMUSCULAR | Status: DC | PRN
  Administered 2012-11-03: 18:00:00

## 2012-11-03 MED ORDER — WARFARIN - PHARMACIST DOSING INPATIENT: Freq: Every day | Status: DC

## 2012-11-03 MED ORDER — FENTANYL CITRATE 0.05 MG/ML IJ SOLN
25.0000 ug | INTRAMUSCULAR | Status: DC | PRN
  Administered 2012-11-03 (×2): 50 ug via INTRAVENOUS

## 2012-11-03 MED ORDER — LACTATED RINGERS IV SOLN
INTRAVENOUS | Status: DC | PRN
  Administered 2012-11-03 (×2): via INTRAVENOUS

## 2012-11-03 MED ORDER — WARFARIN SODIUM 7.5 MG PO TABS
7.5000 mg | ORAL_TABLET | Freq: Once | ORAL | Status: AC
  Administered 2012-11-03: 7.5 mg via ORAL
  Filled 2012-11-03: qty 1

## 2012-11-03 MED ORDER — BISACODYL 10 MG RE SUPP: 10.0000 mg | Freq: Every day | RECTAL | Status: DC | PRN

## 2012-11-03 MED ORDER — DEXTROSE 5 % IV SOLN
5.0000 mg | INTRAVENOUS | Status: AC
  Administered 2012-11-03: 5 mg via INTRAVENOUS
  Filled 2012-11-03: qty 0.5

## 2012-11-03 MED ORDER — LACTATED RINGERS IV SOLN
INTRAVENOUS | Status: DC
  Administered 2012-11-03 – 2012-11-04 (×2): via INTRAVENOUS

## 2012-11-03 MED ORDER — LIDOCAINE HCL (CARDIAC) 20 MG/ML IV SOLN
INTRAVENOUS | Status: DC | PRN
  Administered 2012-11-03: 50 mg via INTRAVENOUS

## 2012-11-03 MED ORDER — LACTATED RINGERS IV SOLN
INTRAVENOUS | Status: DC
Start: 2012-11-03 — End: 2012-11-03
  Administered 2012-11-03: 1000 mL via INTRAVENOUS

## 2012-11-03 MED ORDER — DIPHENHYDRAMINE HCL 25 MG PO CAPS: 25.0000 mg | ORAL_CAPSULE | Freq: Every evening | ORAL | Status: DC | PRN

## 2012-11-03 MED ORDER — OXYCODONE-ACETAMINOPHEN 5-325 MG PO TABS: 1.0000 | ORAL_TABLET | ORAL | Status: DC | PRN

## 2012-11-03 MED ORDER — ACETAMINOPHEN 650 MG RE SUPP: 650.0000 mg | Freq: Four times a day (QID) | RECTAL | Status: DC | PRN

## 2012-11-03 MED ORDER — HYDROCODONE-ACETAMINOPHEN 5-325 MG PO TABS
1.0000 | ORAL_TABLET | Freq: Four times a day (QID) | ORAL | Status: DC | PRN
  Administered 2012-11-03 – 2012-11-04 (×2): 1 via ORAL
  Administered 2012-11-04 – 2012-11-05 (×4): 2 via ORAL
  Filled 2012-11-03 (×2): qty 1
  Filled 2012-11-03 (×4): qty 2

## 2012-11-03 MED ORDER — ACETAMINOPHEN 325 MG PO TABS: 650.0000 mg | ORAL_TABLET | Freq: Four times a day (QID) | ORAL | Status: DC | PRN

## 2012-11-03 MED ORDER — MIDAZOLAM HCL 5 MG/5ML IJ SOLN
INTRAMUSCULAR | Status: DC | PRN
  Administered 2012-11-03: 2 mg via INTRAVENOUS

## 2012-11-03 SURGICAL SUPPLY — 48 items
ADH SKN CLS APL DERMABOND .7 (GAUZE/BANDAGES/DRESSINGS) ×2
BAG SPEC THK2 15X12 ZIP CLS (MISCELLANEOUS)
BAG ZIPLOCK 12X15 (MISCELLANEOUS) ×1 IMPLANT
BANDAGE GAUZE ELAST BULKY 4 IN (GAUZE/BANDAGES/DRESSINGS) ×2 IMPLANT
BIT DRILL 2.8X128 (BIT) ×1 IMPLANT
BIT DRILL 5 ACE CANN QC (BIT) ×1 IMPLANT
CLOTH BEACON ORANGE TIMEOUT ST (SAFETY) ×2 IMPLANT
DERMABOND ADVANCED (GAUZE/BANDAGES/DRESSINGS) ×2
DERMABOND ADVANCED .7 DNX12 (GAUZE/BANDAGES/DRESSINGS) IMPLANT
DRAPE STERI IOBAN 125X83 (DRAPES) ×2 IMPLANT
DRSG AQUACEL AG ADV 3.5X 6 (GAUZE/BANDAGES/DRESSINGS) ×2 IMPLANT
DRSG EMULSION OIL 3X16 NADH (GAUZE/BANDAGES/DRESSINGS) ×1 IMPLANT
DRSG PAD ABDOMINAL 8X10 ST (GAUZE/BANDAGES/DRESSINGS) ×2 IMPLANT
DURAPREP 26ML APPLICATOR (WOUND CARE) ×2 IMPLANT
ELECT REM PT RETURN 9FT ADLT (ELECTROSURGICAL) ×2
ELECTRODE REM PT RTRN 9FT ADLT (ELECTROSURGICAL) ×1 IMPLANT
GLOVE BIOGEL PI IND STRL 8 (GLOVE) ×1 IMPLANT
GLOVE BIOGEL PI IND STRL 8.5 (GLOVE) ×1 IMPLANT
GLOVE BIOGEL PI INDICATOR 8 (GLOVE) ×1
GLOVE BIOGEL PI INDICATOR 8.5 (GLOVE) ×1
GLOVE ECLIPSE 8.0 STRL XLNG CF (GLOVE) ×4 IMPLANT
GLOVE SURG SS PI 6.5 STRL IVOR (GLOVE) ×1 IMPLANT
GLOVE SURG SS PI 8.5 STRL IVOR (GLOVE) ×2
GLOVE SURG SS PI 8.5 STRL STRW (GLOVE) IMPLANT
GOWN PREVENTION PLUS LG XLONG (DISPOSABLE) ×3 IMPLANT
GOWN STRL REIN 2XL XLG LVL4 (GOWN DISPOSABLE) ×1 IMPLANT
GOWN STRL REIN XL XLG (GOWN DISPOSABLE) ×3 IMPLANT
MANIFOLD NEPTUNE II (INSTRUMENTS) ×1 IMPLANT
NS IRRIG 1000ML POUR BTL (IV SOLUTION) ×1 IMPLANT
PACK GENERAL/GYN (CUSTOM PROCEDURE TRAY) ×2 IMPLANT
PAD CAST 4YDX4 CTTN HI CHSV (CAST SUPPLIES) ×1 IMPLANT
PADDING CAST COTTON 4X4 STRL (CAST SUPPLIES) ×2
PIN THREADED GUIDE ACE (PIN) ×1 IMPLANT
POSITIONER SURGICAL ARM (MISCELLANEOUS) ×2 IMPLANT
SCREW CANN 6.5 70MM (Screw) ×2 IMPLANT
SCREW CANN 6.5 80MM (Screw) ×2 IMPLANT
SCREW CANN LG 6.5 FLT 70X22 (Screw) IMPLANT
SCREW CANN LG 6.5 FLT 80X22 (Screw) IMPLANT
SPONGE GAUZE 4X4 12PLY (GAUZE/BANDAGES/DRESSINGS) ×1 IMPLANT
SPONGE LAP 18X18 X RAY DECT (DISPOSABLE) ×1 IMPLANT
STAPLER VISISTAT 35W (STAPLE) ×2 IMPLANT
SUT VIC AB 1 CT1 27 (SUTURE) ×4
SUT VIC AB 1 CT1 27XBRD ANTBC (SUTURE) ×2 IMPLANT
SUT VIC AB 2-0 CT1 27 (SUTURE) ×4
SUT VIC AB 2-0 CT1 27XBRD (SUTURE) ×2 IMPLANT
TOWEL OR 17X26 10 PK STRL BLUE (TOWEL DISPOSABLE) ×4 IMPLANT
TRAY FOLEY CATH 14FRSI W/METER (CATHETERS) IMPLANT
WATER STERILE IRR 1500ML POUR (IV SOLUTION) ×1 IMPLANT

## 2012-11-03 NOTE — Progress Notes (Addendum)
ANTICOAGULATION CONSULT NOTE - Initial Consult  Pharmacy Consult for IV Heparin  Indication: Mechanical Mitral Valve  No Known Allergies  Patient Measurements: Height: 5\' 4"  (162.6 cm) Weight: 128 lb (58.06 kg) IBW/kg (Calculated) : 54.7 Heparin Dosing Weight: 58  Vital Signs: Temp: 98.5 F (36.9 C) (06/11 1945) BP: 150/64 mmHg (06/11 1945) Pulse Rate: 73 (06/11 1945)  Labs:  Recent Labs  11/02/12 1720 11/03/12 0430 11/03/12 1215  HGB 11.4*  --   --   HCT 32.8*  --   --   PLT 344  --   --   LABPROT 36.6* 18.8* 16.7*  INR 4.00* 1.63* 1.39  CREATININE 0.45*  --   --     Estimated Creatinine Clearance: 56.5 ml/min (by C-G formula based on Cr of 0.45).   Medical History: Past Medical History  Diagnosis Date  . Depression   . Diarrhea   . 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 Hypertension   . Broken wrist     Medications:  Facility-administered medications prior to admission  Medication Dose Route Frequency Provider Last Rate Last Dose  . 0.9 %  sodium chloride infusion  500 mL Intravenous Continuous Meryl Dare, MD       Prescriptions prior to admission  Medication Sig Dispense Refill  . amLODipine (NORVASC) 5 MG tablet Take 5 mg by mouth every evening.      . Carboxymethylcell-Hypromellose (GENTEAL) 0.25-0.3 % GEL Apply 1 application to eye every evening.      . fish oil-omega-3 fatty acids 1000 MG capsule Take 2 g by mouth 2 (two) times daily.       Marland Kitchen labetalol (NORMODYNE) 300 MG tablet Take 600 mg by mouth 2 (two) times daily.      . Multiple Vitamin (MULTIVITAMIN WITH MINERALS) TABS Take 1 tablet by mouth daily.      Marland Kitchen oxyCODONE-acetaminophen (PERCOCET/ROXICET) 5-325 MG per tablet Take 1 tablet by mouth every 4 (four) hours as needed for pain.  30 tablet  0  . venlafaxine XR (EFFEXOR-XR) 150 MG 24 hr capsule Take 150 mg by mouth every morning.       . warfarin (COUMADIN) 10 MG tablet Take 10 mg by mouth See admin instructions. Only on Tuesday  evenings      . warfarin (COUMADIN) 7.5 MG tablet Take 7.5 mg by mouth as directed. Take every night except Tuesday      . [DISCONTINUED] acetaminophen (TYLENOL) 500 MG tablet Take 1,500 mg by mouth every 6 (six) hours as needed for pain.       Scheduled:  . amLODipine  5 mg Oral q1800  . artificial tears   Both Eyes QHS  . labetalol  600 mg Oral BID  . ondansetron (ZOFRAN) IV  4 mg Intravenous Once  . venlafaxine XR  150 mg Oral q morning - 10a   Infusions:  . sodium chloride 20 mL (11/02/12 1746)  . sodium chloride Stopped (11/03/12 1638)  . lactated ringers 1,000 mL (11/03/12 1648)   PRN: fentaNYL, fentaNYL, hydrALAZINE, HYDROcodone-acetaminophen, methocarbamol (ROBAXIN) IV, methocarbamol, morphine injection, promethazine Anti-infectives   Start     Dose/Rate Route Frequency Ordered Stop   11/03/12 1800  ceFAZolin (ANCEF) IVPB 2 g/50 mL premix     2 g 100 mL/hr over 30 Minutes Intravenous  Once 11/03/12 1648 11/03/12 1735   11/03/12 1800  polymyxin B 500,000 Units, bacitracin 50,000 Units in sodium chloride irrigation 0.9 % 500 mL irrigation  Status:  Discontinued       As needed 11/03/12 1801 11/03/12 1902      Assessment:  72 yo F on chronic anticoagulation with warfarin for artificial mechanic mitral valve, presented with L hip fracture s/p mechanical fall. INR reversed with Vit. K 15mg  and underwent surgical repair on 6/11.  Spoke with Ortho PA, Tracy at 20:00 - ok to start heparin infusion 12hrs post-op.   Resume Coumadin - may be resistant for a few days from Vit.K. Dose PTA was 7.5mg  daily except 10mg  on Tuesdays.  Goal of Therapy:  INR 2-3 Heparin level 0.3-0.7 units/ml Monitor platelets by anticoagulation protocol: Yes   Plan:   At 0700 on 6/12 start heparin infusion at 1000 units/hr with no bolus.   Check heparin level 6 hours later.  Give Coumadin 7.5mg  tonight at 2200.  Check PT/INR daily.    Charolotte Eke, PharmD, pager [phone]. 11/03/2012,8:08  PM.

## 2012-11-03 NOTE — Progress Notes (Signed)
[facility] PROGRESS NOTE  Jodi Frank NWG:[id number] DOB: 1940/10/28 DOA: 11/02/2012 PCP: Jon Gills, MD  Assessment/Plan: L hip fracture:   Tomasita Crumble Plan is for OR today? pending normalization of INR Percocet and IV morphine for pain control.   HTN: Elevated BP in setting of hip fracture. No cardiac complaints. Continue home meds. Titrate meds as clinically indicated. Added PRN hydralazine   Mitral Valve Replacement:   Followed by [facility]. No cardiac complaints currently. No other known cardiac issues.  INR pending. No signs of active bleeding. Hgb stable from recent ER visit. Heparin per pharmacy after OR.    Hyponatremia: Pt states that this is a chronic issue. Mildly dry on exam. Not on diuretic. Will gently hydrate with normal saline. Check serum Osm. Recheck in am.   Code Status: DNR Family Communication: patient at bedside Disposition Plan: SNF   Consultants:  ortho  Procedures:    Antibiotics:    HPI/Subjective: Having pain in arm/hip  Objective: Filed Vitals:   11/02/12 1939 11/02/12 2006 11/02/12 2106 11/03/12 0502  BP:  188/71 146/65 170/75  Pulse: 80 67 67 73  Temp:  98.4 F (36.9 C) 98.3 F (36.8 C) 98.5 F (36.9 C)  TempSrc:  Oral Oral Oral  Resp:  16 16 18   Height:   5\' 4"  (1.626 m)   Weight:   58.06 kg (128 lb)   SpO2:  96% 94% 93%    Intake/Output Summary (Last 24 hours) at 11/03/12 0832 Last data filed at 11/03/12 0600  Gross per 24 hour  Intake    960 ml  Output   2825 ml  Net  -1865 ml   Filed Weights   11/02/12 2106  Weight: 58.06 kg (128 lb)    Exam:   General:  A+Ox3, NAD  Cardiovascular: +click,   Respiratory: clear anterior  Abdomen: +BS, soft, NT  Musculoskeletal: left arm in sling  Data Reviewed: Basic Metabolic Panel:  Recent Labs Lab 10/29/12 0745 11/02/12 1720  NA 125* 127*  K 4.4 4.0  CL 93* 92*  CO2 24 23  GLUCOSE 114* 100*  BUN 17 10  CREATININE 0.52 0.45*  CALCIUM 9.4 9.6    Liver Function Tests: No results found for this basename: AST, ALT, ALKPHOS, BILITOT, PROT, ALBUMIN,  in the last 168 hours No results found for this basename: LIPASE, AMYLASE,  in the last 168 hours No results found for this basename: AMMONIA,  in the last 168 hours CBC:  Recent Labs Lab 10/29/12 0745 11/02/12 1720  WBC 9.0 5.5  NEUTROABS  --  3.7  HGB 12.0 11.4*  HCT 34.1* 32.8*  MCV 82.2 82.0  PLT 294 344   Cardiac Enzymes: No results found for this basename: CKTOTAL, CKMB, CKMBINDEX, TROPONINI,  in the last 168 hours BNP (last 3 results) No results found for this basename: PROBNP,  in the last 8760 hours CBG: No results found for this basename: GLUCAP,  in the last 168 hours  No results found for this or any previous visit (from the past 240 hour(s)).   Studies: Dg Chest Port 1 View  11/02/2012   *RADIOLOGY REPORT*  Clinical Data: Left wrist and hip fractures.  Preoperative respiratory exam.  PORTABLE CHEST - 1 VIEW  Comparison: None.  Findings: The patient is status post prior median sternotomy. Heart size and mediastinal contours are within normal limits. Lungs show no evidence of edema, consolidation or significant pleural fluid.  Scarring and minimal subsegmental atelectasis is present at both lung  bases.  The visualized bony thorax is unremarkable.  IMPRESSION: No active disease.   Original Report Authenticated By: Irish Lack, M.D.    Scheduled Meds: . amLODipine  5 mg Oral q1800  . artificial tears   Both Eyes QHS  . labetalol  600 mg Oral BID  . ondansetron (ZOFRAN) IV  4 mg Intravenous Once  . phytonadione (VITAMIN K) IV  5 mg Intravenous NOW  . venlafaxine XR  150 mg Oral q morning - 10a   Continuous Infusions: . sodium chloride 20 mL (11/02/12 1746)  . sodium chloride 75 mL/hr at 11/02/12 2024    Active Problems:   * No active hospital problems. *    Time spent: 35    [facility], Catherine Cubero  [facility] Pager [phone]. If 7PM-7AM, please  contact night-coverage at [email or web address], password [facility] 11/03/2012, 8:32 AM  LOS: 1 day

## 2012-11-03 NOTE — Anesthesia Preprocedure Evaluation (Addendum)
Anesthesia Evaluation  Patient identified by MRN, date of birth, ID band Patient awake    Reviewed: Allergy & Precautions, H&P , NPO status , Patient's Chart, lab work & pertinent test results  Airway Mallampati: II TM Distance: >3 FB Neck ROM: Full    Dental no notable dental hx.    Pulmonary neg pulmonary ROS,  breath sounds clear to auscultation  Pulmonary exam normal       Cardiovascular hypertension, Pt. on medications + Valvular Problems/Murmurs MR Rhythm:Regular Rate:Normal     Neuro/Psych negative neurological ROS  negative psych ROS   GI/Hepatic negative GI ROS, Neg liver ROS,   Endo/Other  negative endocrine ROS  Renal/GU negative Renal ROS  negative genitourinary   Musculoskeletal negative musculoskeletal ROS (+)   Abdominal   Peds negative pediatric ROS (+)  Hematology negative hematology ROS (+)   Anesthesia Other Findings   Reproductive/Obstetrics negative OB ROS                          Anesthesia Physical Anesthesia Plan  ASA: III  Anesthesia Plan: General   Post-op Pain Management:    Induction: Intravenous  Airway Management Planned: Oral ETT  Additional Equipment:   Intra-op Plan:   Post-operative Plan: Extubation in OR  Informed Consent: I have reviewed the patients History and Physical, chart, labs and discussed the procedure including the risks, benefits and alternatives for the proposed anesthesia with the patient or authorized representative who has indicated his/her understanding and acceptance.   Dental advisory given  Plan Discussed with: CRNA and Surgeon  Anesthesia Plan Comments:         Anesthesia Quick Evaluation

## 2012-11-03 NOTE — Transfer of Care (Signed)
Immediate Anesthesia Transfer of Care Note  Patient: Jodi Frank  Procedure(s) Performed: Procedure(s): CANNULATED HIP PINNING (Left)  Patient Location: PACU  Anesthesia Type:General  Level of Consciousness: awake and alert   Airway & Oxygen Therapy: Patient Spontanous Breathing and Patient connected to face mask oxygen  Post-op Assessment: Report given to PACU RN and Post -op Vital signs reviewed and stable  Post vital signs: Reviewed and stable  Complications: No apparent anesthesia complications

## 2012-11-03 NOTE — Progress Notes (Signed)
Clinical Social Work Department BRIEF PSYCHOSOCIAL ASSESSMENT 11/03/2012  Patient:  SHAMEKA, AGGARWAL     Account Number:  [id number]     Admit date:  11/02/2012  Clinical Social Worker:  Candie Chroman  Date/Time:  11/03/2012 11:36 AM  Referred by:  Physician  Date Referred:  11/03/2012 Referred for  SNF Placement   Other Referral:   Interview type:   Other interview type:    PSYCHOSOCIAL DATA Living Status:  ALONE Admitted from facility:   Level of care:   Primary support name:  Kennith Gain Primary support relationship to patient:  CHILD, ADULT Degree of support available:   supportive    CURRENT CONCERNS Current Concerns  Post-Acute Placement   Other Concerns:    SOCIAL WORK ASSESSMENT / PLAN Pt is a 72 yr old female living at home prior to hospitalization. CSW met with pt to assist with d/c planning. Pt is scheduled for surgery today. Pt states she is not sure what he plans will be following hospital d/c. Pt lives alone . PT will provide recommendations following surgery. HH services vs ST Rehab placement were discussed. CSW will meet again with pt following surgery to assist with d/c planning.   Assessment/plan status:  Psychosocial Support/Ongoing Assessment of Needs Other assessment/ plan:   Home vs SNF   Information/referral to community resources:   Insurance process for SNF reviewed.    PATIENT'S/FAMILY'S RESPONSE TO PLAN OF CARE: Pt would like to hear recommendations from MD / PT to help with her d/c planning. She will consider ST Rehab if recommended, but would prefer to return home with [facility] services.   Cori Razor LCSW [phone]

## 2012-11-03 NOTE — Interval H&P Note (Signed)
History and Physical Interval Note:  11/03/2012 4:54 PM  Jodi Frank  has presented today for surgery, with the diagnosis of left femoral neck fracture  The various methods of treatment have been discussed with the patient and family. After consideration of risks, benefits and other options for treatment, the patient has consented to  Procedure(s): CANNULATED HIP PINNING (Left) as a surgical intervention .  The patient's history has been reviewed, patient examined, no change in status, stable for surgery.  I have reviewed the patient's chart and labs.  Questions were answered to the patient's satisfaction.     Thaily Hackworth A

## 2012-11-03 NOTE — Anesthesia Postprocedure Evaluation (Signed)
  Anesthesia Post-op Note  Patient: Jodi Frank  Procedure(s) Performed: Procedure(s) (LRB): CANNULATED HIP PINNING (Left)  Patient Location: PACU  Anesthesia Type: General  Level of Consciousness: awake and alert   Airway and Oxygen Therapy: Patient Spontanous Breathing  Post-op Pain: mild  Post-op Assessment: Post-op Vital signs reviewed, Patient's Cardiovascular Status Stable, Respiratory Function Stable, Patent Airway and No signs of Nausea or vomiting  Last Vitals:  Filed Vitals:   11/03/12 1544  BP: 170/69  Pulse: 66  Temp: 36.3 C  Resp: 16    Post-op Vital Signs: stable   Complications: No apparent anesthesia complications

## 2012-11-03 NOTE — H&P (View-Only) (Signed)
Reason for Consult:Hip Fracture on THe Left. Referring Physician: Dr. Zerita Boers is an 72 y.o. female.  HPI: Patient fell last Friday and sustained a Colles Fracture on the Left and a Femoral Neck Fracture.  Past Medical History  Diagnosis Date  . Depression   . Diarrhea   . 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 Hypertension   . Broken wrist     Past Surgical History  Procedure Laterality Date  . Mitral valve replacement      Pt. is on coumadin.  . Shoulder open rotator cuff repair      Family History  Problem Relation Age of Onset  . Stroke Mother   . Colon cancer Paternal Grandmother     Social History:  reports that she has never smoked. She has never used smokeless tobacco. She reports that she drinks about 4.2 ounces of alcohol per week. She reports that she does not use illicit drugs.  Allergies: No Known Allergies  Medications: I have reviewed the patient's current medications.  Results for orders placed during the hospital encounter of 11/02/12 (from the past 48 hour(s))  BASIC METABOLIC PANEL     Status: Abnormal   Collection Time    11/02/12  5:20 PM      Result Value Range   Sodium 127 (*) 135 - 145 mEq/L   Potassium 4.0  3.5 - 5.1 mEq/L   Chloride 92 (*) 96 - 112 mEq/L   CO2 23  19 - 32 mEq/L   Glucose, Bld 100 (*) 70 - 99 mg/dL   BUN 10  6 - 23 mg/dL   Creatinine, Ser 1.61 (*) 0.50 - 1.10 mg/dL   Calcium 9.6  8.4 - 09.6 mg/dL   GFR calc non Af Amer >90  >90 mL/min   GFR calc Af Amer >90  >90 mL/min   Comment:            The eGFR has been calculated     using the CKD EPI equation.     This calculation has not been     validated in all clinical     situations.     eGFR's persistently     <90 mL/min signify     possible Chronic Kidney Disease.  CBC WITH DIFFERENTIAL     Status: Abnormal   Collection Time    11/02/12  5:20 PM      Result Value Range   WBC 5.5  4.0 - 10.5 K/uL   RBC 4.00  3.87 - 5.11 MIL/uL   Hemoglobin 11.4  (*) 12.0 - 15.0 g/dL   HCT 04.5 (*) 40.9 - 81.1 %   MCV 82.0  78.0 - 100.0 fL   MCH 28.5  26.0 - 34.0 pg   MCHC 34.8  30.0 - 36.0 g/dL   RDW 91.4  78.2 - 95.6 %   Platelets 344  150 - 400 K/uL   Neutrophils Relative % 68  43 - 77 %   Neutro Abs 3.7  1.7 - 7.7 K/uL   Lymphocytes Relative 15  12 - 46 %   Lymphs Abs 0.9  0.7 - 4.0 K/uL   Monocytes Relative 12  3 - 12 %   Monocytes Absolute 0.7  0.1 - 1.0 K/uL   Eosinophils Relative 4  0 - 5 %   Eosinophils Absolute 0.2  0.0 - 0.7 K/uL   Basophils Relative 1  0 - 1 %   Basophils Absolute  0.0  0.0 - 0.1 K/uL  PROTIME-INR     Status: Abnormal   Collection Time    11/02/12  5:20 PM      Result Value Range   Prothrombin Time 36.6 (*) 11.6 - 15.2 seconds   INR 4.00 (*) 0.00 - 1.49  TYPE AND SCREEN     Status: None   Collection Time    11/02/12  5:20 PM      Result Value Range   ABO/RH(D) O POS     Antibody Screen NEG     Sample Expiration 11/05/2012    RETICULOCYTES     Status: None   Collection Time    11/02/12  7:40 PM      Result Value Range   Retic Ct Pct 1.6  0.4 - 3.1 %   RBC. 4.10  3.87 - 5.11 MIL/uL   Retic Count, Manual 65.6  19.0 - 186.0 K/uL  ABO/RH     Status: None   Collection Time    11/02/12  7:50 PM      Result Value Range   ABO/RH(D) O POS    URINALYSIS, ROUTINE W REFLEX MICROSCOPIC     Status: Abnormal   Collection Time    11/02/12  7:51 PM      Result Value Range   Color, Urine YELLOW  YELLOW   APPearance CLEAR  CLEAR   Specific Gravity, Urine 1.012  1.005 - 1.030   pH 6.5  5.0 - 8.0   Glucose, UA NEGATIVE  NEGATIVE mg/dL   Hgb urine dipstick NEGATIVE  NEGATIVE   Bilirubin Urine NEGATIVE  NEGATIVE   Ketones, ur NEGATIVE  NEGATIVE mg/dL   Protein, ur NEGATIVE  NEGATIVE mg/dL   Urobilinogen, UA 1.0  0.0 - 1.0 mg/dL   Nitrite NEGATIVE  NEGATIVE   Leukocytes, UA TRACE (*) NEGATIVE  URINE MICROSCOPIC-ADD ON     Status: None   Collection Time    11/02/12  7:51 PM      Result Value Range   WBC, UA 0-2   <3 WBC/hpf    Dg Chest Port 1 View  11/02/2012   *RADIOLOGY REPORT*  Clinical Data: Left wrist and hip fractures.  Preoperative respiratory exam.  PORTABLE CHEST - 1 VIEW  Comparison: None.  Findings: The patient is status post prior median sternotomy. Heart size and mediastinal contours are within normal limits. Lungs show no evidence of edema, consolidation or significant pleural fluid.  Scarring and minimal subsegmental atelectasis is present at both lung bases.  The visualized bony thorax is unremarkable.  IMPRESSION: No active disease.   Original Report Authenticated By: Irish Lack, M.D.    Review of Systems  Constitutional: Negative.   HENT: Negative.   Eyes: Negative.   Respiratory: Negative.   Cardiovascular: Negative.   Gastrointestinal: Negative.   Genitourinary: Negative.   Musculoskeletal: Positive for joint pain.  Skin: Negative.   Neurological: Negative.   Endo/Heme/Allergies: Negative.   Psychiatric/Behavioral: Negative.    Blood pressure 146/65, pulse 67, temperature 98.3 F (36.8 C), temperature source Oral, resp. rate 16, SpO2 94.00%. Physical Exam  Constitutional: She appears well-developed.  HENT:  Head: Normocephalic.  Eyes: Pupils are equal, round, and reactive to light.  Neck: Normal range of motion.  Cardiovascular: Normal rate.   Mechanical HeartValve with a audible Click.  Respiratory: Effort normal.  GI: Soft.  Musculoskeletal:  Fracture Radius and Impacted,Valgus Left Hip Fracture.  Neurological: She is alert.  Skin: Skin is warm.  Psychiatric: She has  a normal mood and affect.    Assessment/Plan: HIp Pinning Tomorrow and Vitamin K to Reduce her INR per Pharmacy  Seiji Wiswell A 11/02/2012, 9:17 PM

## 2012-11-03 NOTE — Brief Op Note (Signed)
11/02/2012 - 11/03/2012  6:28 PM  PATIENT:  Jodi Frank  72 y.o. female  PRE-OPERATIVE DIAGNOSIS:  left femoral neck fracture,displaced  POST-OPERATIVE DIAGNOSIS:  left femoral neck fracture,displaced  PROCEDURE:  Procedure(s): CANNULATED HIP PINNING (Left) Hip  SURGEON:  Surgeon(s) and Role:    * Jacki Cones, MD - Primary  PHYSICIAN ASSISTANT: Dimitri Ped PA  ASSISTANTS: Dimitri Ped PA  ANESTHESIA:   general  EBL:  Total I/O In: 1795 [P.O.:20; I.V.:1775] Out: 2300 [Urine:2100; Blood:200]  BLOOD ADMINISTERED:none  DRAINS: none   LOCAL MEDICATIONS USED:  BUPIVICAINE 20cc mixed with 20cc of Normal Saline  SPECIMEN:  No Specimen  DISPOSITION OF SPECIMEN:  N/A  COUNTS:  YES  TOURNIQUET:  * No tourniquets in log *  DICTATION: .Other Dictation: Dictation Number [id number]  PLAN OF CARE: Admit to inpatient   PATIENT DISPOSITION:  Stable in OR   Delay start of Pharmacological VTE agent (>24hrs) due to surgical blood loss or risk of bleeding: yes

## 2012-11-03 NOTE — Progress Notes (Signed)
Nutrition Brief Note  Patient identified on the Malnutrition Screening Tool (MST) Report  Body mass index is 21.96 kg/(m^2). Patient meets criteria for normal weight based on current BMI.   Pt admitted with left hip fracture, NPO for possible surgery today. Labs and medications reviewed. Sodium low, glucose slightly elevated. Met with pt who reports eating well PTA with good appetite and following a healthy diet. Pt works at [employer] and states she started eating a healthy meal plan with specific calories/proteins to follow as part of a weight management program with the health system and has lost 10 pounds intentionally since then. Pt denies any questions about nutrition. Encouraged adequate protein after surgery to promote wound healing.   No nutrition interventions warranted at this time. If nutrition issues arise, please consult RD.   Levon Hedger MS, RD, LDN [phone] Pager [phone] After Hours Pager

## 2012-11-04 ENCOUNTER — Encounter (HOSPITAL_COMMUNITY): Payer: Self-pay | Admitting: Orthopedic Surgery

## 2012-11-04 LAB — CBC
HCT: 33.1 % — ABNORMAL LOW (ref 36.0–46.0)
MCH: 27.5 pg (ref 26.0–34.0)
MCV: 82.8 fL (ref 78.0–100.0)
Platelets: 298 10*3/uL (ref 150–400)
RDW: 14 % (ref 11.5–15.5)
WBC: 8.3 10*3/uL (ref 4.0–10.5)

## 2012-11-04 LAB — BASIC METABOLIC PANEL
BUN: 9 mg/dL (ref 6–23)
Calcium: 9.5 mg/dL (ref 8.4–10.5)
Chloride: 94 mEq/L — ABNORMAL LOW (ref 96–112)
Creatinine, Ser: 0.47 mg/dL — ABNORMAL LOW (ref 0.50–1.10)
GFR calc Af Amer: >90 mL/min (ref >90)

## 2012-11-04 LAB — PROTIME-INR: Prothrombin Time: 14.8 seconds (ref 11.6–15.2)

## 2012-11-04 LAB — HEPARIN LEVEL (UNFRACTIONATED): Heparin Unfractionated: 0.22 IU/mL — ABNORMAL LOW (ref 0.30–0.70)

## 2012-11-04 MED ORDER — HEPARIN (PORCINE) IN NACL 100-0.45 UNIT/ML-% IJ SOLN
1250.0000 [IU]/h | INTRAMUSCULAR | Status: DC
  Filled 2012-11-04: qty 250

## 2012-11-04 MED ORDER — HEPARIN (PORCINE) IN NACL 100-0.45 UNIT/ML-% IJ SOLN
1350.0000 [IU]/h | INTRAMUSCULAR | Status: DC
  Administered 2012-11-05: 1350 [IU]/h via INTRAVENOUS
  Filled 2012-11-04: qty 250

## 2012-11-04 MED ORDER — WARFARIN SODIUM 10 MG PO TABS
10.0000 mg | ORAL_TABLET | Freq: Once | ORAL | Status: AC
  Administered 2012-11-04: 10 mg via ORAL
  Filled 2012-11-04: qty 1

## 2012-11-04 NOTE — Op Note (Signed)
[name], Jodi NO.:  [id number]  MEDICAL RECORD NO.:  [id number]  LOCATION:  1607                         FACILITY:  [facility]  PHYSICIAN:  Georges Lynch. Jarrah Babich, M.D.DATE OF BIRTH:  08/13/1940  DATE OF PROCEDURE:  11/03/2012 DATE OF DISCHARGE:                              OPERATIVE REPORT   SURGEON:  Georges Lynch. Darrelyn Hillock, M.D.  ASSISTANT:  Dimitri Ped, [place].  PREOPERATIVE DIAGNOSIS:  Slightly displaced valgus impacted type femoral neck fracture, left hip.  POSTOPERATIVE DIAGNOSIS:  Slightly displaced valgus impacted type femoral neck fracture, left hip.  OPERATION:  Open reduction and cannulated pin fixation of the femoral neck fracture utilizing 2 cannulated screws.  The appropriate screw lengths were inserted after appropriate measurements were taken.  PROCEDURE:  Under general anesthesia, the patient on the fracture table, routine orthopedic prep followed by a sterile orthopedic prep and draping was carried out.  Appropriate time-out was first carried out.  I also marked the appropriate left leg in the holding area.  At this time, the C-arm was brought in.  An x-ray was taken for localization purposes. Incision then was made over the greater trochanteric region on the left. Bleeders were identified and cauterized.  The incision was carried down to the vastus lateralis until I identified the femoral shaft.  Two drill holes were made in the femoral shaft.  Guide pins were inserted at the appropriate angle and I elected not to put 3 pins and because this lady was a very small lady.  At this time, the guide pins were inserted, measurements were taken and the appropriate screws then were selected. I then inserted 2 cannulated screws, 2nd cannulated screw measured 80 mm in length, the 1st one measured 75 mm in length.  I thoroughly irrigated out the area.  X-rays AP and lateral views were taken to make sure we had the screws in the proper position.  We then  closed the wound layers in usual fashion.  I injected a mixture of 20 mL of Exparel with 10 mL of normal saline.  Sterile dressings were applied.          ______________________________ Georges Lynch Darrelyn Hillock, M.D.     RAG/MEDQ  D:  11/03/2012  T:  11/04/2012  Job:  [id number]  cc:   Madelynn Done, MD Fax: [phone]

## 2012-11-04 NOTE — Progress Notes (Signed)
ANTICOAGULATION CONSULT NOTE - Follow Up Consult  Pharmacy Consult for IV heparin, Coumadin Indication: Mechanical mitral valve  No Known Allergies  Patient Measurements: Height: 5\' 4"  (162.6 cm) Weight: 128 lb (58.06 kg) IBW/kg (Calculated) : 54.7 Heparin Dosing Weight: 58kg  Vital Signs: Temp: 97.8 F (36.6 C) (06/12 1300) Temp src: Oral (06/12 1300) BP: 131/62 mmHg (06/12 1300) Pulse Rate: 71 (06/12 1300)  Labs:  Recent Labs  11/02/12 1720 11/03/12 0430 11/03/12 1215 11/04/12 0438 11/04/12 1318  HGB 11.4*  --   --  11.0*  --   HCT 32.8*  --   --  33.1*  --   PLT 344  --   --  298  --   LABPROT 36.6* 18.8* 16.7* 14.8  --   INR 4.00* 1.63* 1.39 1.18  --   HEPARINUNFRC  --   --   --   --  0.14*  CREATININE 0.45*  --   --  0.47*  --     Estimated Creatinine Clearance: 56.5 ml/min (by C-G formula based on Cr of 0.47).   Assessment: 72 yo female admitted 6/10 with hip fracture.  Pt was on chronic warfarin PTA for mechanical MVR.  PTA Coumadin dose was reportedly 7.5mg  daily except 10mg  on Tuesdays - last taken 6/9. INR = 4 on admit.  Patient received Vitamin K 10 mg IV x 1 on 6/10, Vitamin K 5 mg IV x 1 6/11 and INR trended down to 1.39 for surgery 6/11 PM.  Patient was started on IV heparin 12 hours after AET at 1000 units/hr with no bolus.  Coumadin also started 6/11 PM.   6/12: INR 1.18, resistant expected given recent large doses of Vitamin K. CBC stable. Heparin level subtherapeutic at 0.14 on heparin 1000 units/hr.   MD plans to change to lovenox once bed available at SNF  Goal of Therapy:  INR 2.5 - 3.5 (unless otherwise specify by MD) Heparin level 0.3 -0.7 IU/mL Monitor platelets by anticoagulation protocol: Yes   Plan:   No additional IV heparin bolus  Increase IV heparin rate to 1250 units/hr  Heparin level 6 hours s/p rate change  Coumadin boosted dose of 10 mg tonight x 1  Daily heparin level and PT/INR  Geoffry Paradise, PharmD, BCPS Pager:  [phone] 2:41 PM Pharmacy #: [id number]

## 2012-11-04 NOTE — Progress Notes (Signed)
Clinical Social Work Department CLINICAL SOCIAL WORK PLACEMENT NOTE 11/04/2012  Patient:  Jodi Frank, Jodi Frank  Account Number:  [id number] Admit date:  11/02/2012  Clinical Social Worker:  Cori Razor, LCSW  Date/time:  11/04/2012 12:31 PM  Clinical Social Work is seeking post-discharge placement for this patient at the following level of care:   SKILLED NURSING   (*CSW will update this form in Epic as items are completed)     Patient/family provided with [facility] Department of Clinical Social Work's list of facilities offering this level of care within the geographic area requested by the patient (or if unable, by the patient's family).  11/04/2012  Patient/family informed of their freedom to choose among providers that offer the needed level of care, that participate in Medicare, Medicaid or managed care program needed by the patient, have an available bed and are willing to accept the patient.    Patient/family informed of [facility]' ownership interest in [facility], as well as of the fact that they are under no obligation to receive care at this facility.  PASARR submitted to EDS on 11/04/2012 PASARR number received from EDS on 11/04/2012  FL2 transmitted to all facilities in geographic area requested by pt/family on  11/04/2012 FL2 transmitted to all facilities within larger geographic area on   Patient informed that his/her managed care company has contracts with or will negotiate with  certain facilities, including the following:     Patient/family informed of bed offers received:  11/04/2012 Patient chooses bed at [facility] Physician recommends and patient chooses bed at    Patient to be transferred to  on   Patient to be transferred to facility by   The following physician request were entered in Epic:   Additional Comments:  Cori Razor LCSW [phone]

## 2012-11-04 NOTE — Evaluation (Signed)
Physical Therapy Evaluation Patient Details Name: Jodi Frank MRN: [id number] DOB: 06/24/1940 Today's Date: 11/04/2012 Time: 4098-1191 PT Time Calculation (min): 38 min  PT Assessment / Plan / Recommendation Clinical Impression  pt is adm with L hip fx and L radius fx due to falll, now s/p L hip pinning and L UE splint; Will benefit from PT in acute setting to address deficits below; Plan is for SNF/post acute rehab at [facility], PT recommends/agrees with plan.    PT Assessment  Patient needs continued PT services    Follow Up Recommendations  SNF    Does the patient have the potential to tolerate intense rehabilitation      Barriers to Discharge        Equipment Recommendations  None recommended by PT    Recommendations for Other Services     Frequency 7X/week    Precautions / Restrictions Precautions Precautions: Fall Required Braces or Orthoses: Other Brace/Splint Other Brace/Splint: left UE splint/radius fx Restrictions LLE Weight Bearing: Partial weight bearing LLE Partial Weight Bearing Percentage or Pounds: 50%   Pertinent Vitals/Pain 3-4/10 L hip/wrist      Mobility  Bed Mobility Bed Mobility: Supine to Sit;Sitting - Scoot to Edge of Bed Supine to Sit: 4: Min assist Sitting - Scoot to [place] of Bed: 4: Min assist;3: Mod assist Details for Bed Mobility Assistance: increased time, cues for technique Transfers Transfers: Sit to Stand;Stand to Sit Sit to Stand: 4: Min assist Stand to Sit: 4: Min assist Details for Transfer Assistance: cues for L UE management, and assist with balance on intial stand;  Ambulation/Gait Ambulation/Gait Assistance: 4: Min assist Ambulation Distance (Feet): 50 Feet Assistive device: Left platform walker Ambulation/Gait Assistance Details: cues for PWB, sequence, upward gaze Gait Pattern: Step-to pattern    Exercises Total Joint Exercises Ankle Circles/Pumps: AROM;10 reps;Both   PT Diagnosis: Difficulty walking  PT Problem  List: Decreased strength;Decreased balance;Decreased activity tolerance;Decreased range of motion;Decreased mobility;Decreased knowledge of use of DME PT Treatment Interventions: DME instruction;Gait training;Functional mobility training;Balance training;Therapeutic exercise;Therapeutic activities;Patient/family education   PT Goals Acute Rehab PT Goals PT Goal Formulation: With patient Time For Goal Achievement: 11/11/12 Potential to Achieve Goals: Good Pt will go Supine/Side to Sit: with supervision PT Goal: Supine/Side to Sit - Progress: Goal set today Pt will go Sit to Stand: with supervision PT Goal: Sit to Stand - Progress: Goal set today Pt will go Stand to Sit: with supervision PT Goal: Stand to Sit - Progress: Goal set today Pt will Ambulate: 51 - 150 feet;with supervision;with rolling walker PT Goal: Ambulate - Progress: Goal set today  Visit Information  Last PT Received On: 11/04/12 Assistance Needed: +1    Subjective Data  Subjective: I was ok until you got here(pt joking) Patient Stated Goal: rehab   Prior Functioning  Home Living Available Help at Discharge: Skilled Nursing Facility Additional Comments: Pt planning  for  rehab [place]  Prior Function Level of Independence: Independent Vocation: Full time employment Comments: training and development at [employer] Communication Communication: No difficulties    Cognition  Cognition Arousal/Alertness: Awake/alert Behavior During Therapy: WFL for tasks assessed/performed Overall Cognitive Status: Within Functional Limits for tasks assessed    Extremity/Trunk Assessment Right Upper Extremity Assessment RUE ROM/Strength/Tone: WFL for tasks assessed RUE ROM/Strength/Tone Deficits: LUE splint  Left Upper Extremity Assessment LUE ROM/Strength/Tone: Unable to fully assess;Deficits;Due to precautions LUE ROM/Strength/Tone Deficits: L UE splint;  L shoulder AAROM grossly WFL Right Lower Extremity  Assessment RLE ROM/Strength/Tone: [facility]  for tasks assessed Left Lower Extremity Assessment LLE ROM/Strength/Tone: Deficits;Unable to fully assess;Due to pain LLE ROM/Strength/Tone Deficits: AAROM grossly WFL; strength at least 3+/5   Balance    End of Session PT - End of Session Activity Tolerance: Patient tolerated treatment well Patient left: in chair;with call bell/phone within reach;with family/visitor present  GP     [facility] 11/04/2012, 10:51 AM

## 2012-11-04 NOTE — Progress Notes (Signed)
ANTICOAGULATION CONSULT NOTE - Follow Up Consult  Pharmacy Consult for Heparin Indication: Mechanical mitral valve  No Known Allergies  Patient Measurements: Height: 5\' 4"  (162.6 cm) Weight: 128 lb (58.06 kg) IBW/kg (Calculated) : 54.7 Heparin Dosing Weight:   Vital Signs: Temp: 97.4 F (36.3 C) (06/12 2100) Temp src: Oral (06/12 2100) BP: 145/72 mmHg (06/12 2100) Pulse Rate: 69 (06/12 2100)  Labs:  Recent Labs  11/02/12 1720 11/03/12 0430 11/03/12 1215 11/04/12 0438 11/04/12 1318 11/04/12 2057  HGB 11.4*  --   --  11.0*  --   --   HCT 32.8*  --   --  33.1*  --   --   PLT 344  --   --  298  --   --   LABPROT 36.6* 18.8* 16.7* 14.8  --   --   INR 4.00* 1.63* 1.39 1.18  --   --   HEPARINUNFRC  --   --   --   --  0.14* 0.22*  CREATININE 0.45*  --   --  0.47*  --   --     Estimated Creatinine Clearance: 56.5 ml/min (by C-G formula based on Cr of 0.47).   Medications:  Infusions:  . heparin    . lactated ringers 10 mL/hr at 11/04/12 1400    Assessment: Patient with low heparin level.  No issues noted per RN.  Goal of Therapy:  Heparin level 0.3-0.7 units/ml Monitor platelets by anticoagulation protocol: Yes   Plan:  Increase heparin to 1350 units/hr, recheck level at 0800   Aleene Davidson Crowford 11/04/2012,11:23 PM

## 2012-11-04 NOTE — Progress Notes (Signed)
[facility] PROGRESS NOTE  Jodi Frank ZOX:[id number] DOB: [birth year] DOA: 11/02/2012 PCP: Jon Gills, MD  Assessment/Plan: L hip fracture:   Tomasita Crumble S/p surgery on 6/11 Ambulate  L distal radius fracture -splinted  HTN: Elevated BP in setting of hip fracture. No cardiac complaints. Continue home meds. Titrate meds as clinically indicated. Added PRN hydralazine   Mitral Valve Replacement:   Followed by [facility]. No cardiac complaints currently. No other known cardiac issues.   Heparin per pharmacy with continued warfarin- plan to change to lovenox once bed available at SNF.    Hyponatremia: Pt states that this is a chronic issue. Mildly dry on exam. Not on diuretic.    Code Status: DNR Family Communication: patient at bedside Disposition Plan: SNF   Consultants:  ortho  Procedures:    Antibiotics:    HPI/Subjective: Mild hip pain- feeling well No SOB, no CP  Objective: Filed Vitals:   11/03/12 2210 11/03/12 2307 11/04/12 0153 11/04/12 0448  BP: 168/72 145/78 148/71 173/73  Pulse: 74 70 72 70  Temp: 97.9 F (36.6 C) 98.3 F (36.8 C) 98.2 F (36.8 C) 98.5 F (36.9 C)  TempSrc: Oral Oral Oral Oral  Resp: 16 16 16 16   Height:      Weight:      SpO2: 94% 97% 99% 99%    Intake/Output Summary (Last 24 hours) at 11/04/12 0817 Last data filed at 11/04/12 0600  Gross per 24 hour  Intake 3836.67 ml  Output   4100 ml  Net -263.33 ml   Filed Weights   11/02/12 2106  Weight: 58.06 kg (128 lb)    Exam:   General:  A+Ox3, NAD  Cardiovascular: rrr   Respiratory: clear anterior  Abdomen: +BS, soft, NT  Musculoskeletal: left arm in sling  Data Reviewed: Basic Metabolic Panel:  Recent Labs Lab 10/29/12 0745 11/02/12 1720 11/04/12 0438  NA 125* 127* 129*  K 4.4 4.0 4.4  CL 93* 92* 94*  CO2 24 23 25   GLUCOSE 114* 100* 152*  BUN 17 10 9   CREATININE 0.52 0.45* 0.47*  CALCIUM 9.4 9.6 9.5   Liver Function Tests: No results  found for this basename: AST, ALT, ALKPHOS, BILITOT, PROT, ALBUMIN,  in the last 168 hours No results found for this basename: LIPASE, AMYLASE,  in the last 168 hours No results found for this basename: AMMONIA,  in the last 168 hours CBC:  Recent Labs Lab 10/29/12 0745 11/02/12 1720 11/04/12 0438  WBC 9.0 5.5 8.3  NEUTROABS  --  3.7  --   HGB 12.0 11.4* 11.0*  HCT 34.1* 32.8* 33.1*  MCV 82.2 82.0 82.8  PLT 294 344 298   Cardiac Enzymes: No results found for this basename: CKTOTAL, CKMB, CKMBINDEX, TROPONINI,  in the last 168 hours BNP (last 3 results) No results found for this basename: PROBNP,  in the last 8760 hours CBG: No results found for this basename: GLUCAP,  in the last 168 hours  Recent Results (from the past 240 hour(s))  SURGICAL PCR SCREEN     Status: None   Collection Time    11/03/12  3:56 PM      Result Value Range Status   MRSA, PCR NEGATIVE  NEGATIVE Final   Staphylococcus aureus NEGATIVE  NEGATIVE Final   Comment:            The Xpert SA Assay (FDA     approved for NASAL specimens     in patients over 21 years of  age),     is one component of     a comprehensive surveillance     program.  Test performance has     been validated by [facility] for patients greater     than or equal to 47 year old.     It is not intended     to diagnose infection nor to     guide or monitor treatment.     Studies: Dg Hip Operative Left  11/03/2012   *RADIOLOGY REPORT*  Clinical Data: 72 year old female ORIF left hip.  Fluoroscopy time:  0 minutes 30 seconds.  OPERATIVE LEFT HIP  Comparison: 07/23/2008.  Findings: Two intraoperative fluoroscopic views of the proximal left femur.  Two cannulated screws have been placed traversing a proximal left femoral neck fracture.  Alignment appears near anatomic.  Hardware intact.  IMPRESSION: ORIF proximal left femur with no adverse features.   Original Report Authenticated By: Erskine Speed, M.D.   Dg Chest Port 1  View  11/02/2012   *RADIOLOGY REPORT*  Clinical Data: Left wrist and hip fractures.  Preoperative respiratory exam.  PORTABLE CHEST - 1 VIEW  Comparison: None.  Findings: The patient is status post prior median sternotomy. Heart size and mediastinal contours are within normal limits. Lungs show no evidence of edema, consolidation or significant pleural fluid.  Scarring and minimal subsegmental atelectasis is present at both lung bases.  The visualized bony thorax is unremarkable.  IMPRESSION: No active disease.   Original Report Authenticated By: Irish Lack, M.D.    Scheduled Meds: . amLODipine  5 mg Oral q1800  . artificial tears   Both Eyes QHS  . labetalol  600 mg Oral BID  . venlafaxine XR  150 mg Oral q morning - 10a  . Warfarin - Pharmacist Dosing Inpatient   Does not apply q1800   Continuous Infusions: . heparin 1,000 Units/hr (11/04/12 1610)  . lactated ringers 100 mL/hr at 11/03/12 2223    Active Problems:   * No active hospital problems. *    Time spent: 35    [facility], Jodi Frank  [facility] Pager [phone]. If 7PM-7AM, please contact night-coverage at [email or web address], password [facility] 11/04/2012, 8:17 AM  LOS: 2 days

## 2012-11-04 NOTE — Care Management Note (Signed)
  Page 1 of 1   11/04/2012     2:15:14 PM   CARE MANAGEMENT NOTE 11/04/2012  Patient:  Jodi Frank, Jodi Frank   Account Number:  [id number]  Date Initiated:  11/04/2012  Documentation initiated by:  Colleen Can  Subjective/Objective Assessment:   dx OrIF     Action/Plan:   SNF rehab vs HH   Anticipated DC Date:  11/05/2012   Anticipated DC Plan:  HOME W HOME HEALTH SERVICES  In-house referral  Clinical Social Worker      DC Planning Services  CM consult      Choice offered to / List presented to:             Status of service:  In process, will continue to follow Medicare Important Message given?   (If response is "NO", the following Medicare IM given date fields will be blank) Date Medicare IM given:   Date Additional Medicare IM given:    Discharge Disposition:    Per UR Regulation:  Reviewed for med. necessity/level of care/duration of stay  If discussed at Long Length of Stay Meetings, dates discussed:    Comments:

## 2012-11-04 NOTE — Progress Notes (Signed)
11/04/12 1600  PT Visit Information  Last PT Received On 11/04/12  Assistance Needed +1  PT Time Calculation  PT Start Time 1506  PT Stop Time 1531  PT Time Calculation (min) 25 min  Subjective Data  Subjective i do not want to get back in the bed  Patient Stated Goal rehab  Precautions  Precautions Fall  Required Braces or Orthoses Other Brace/Splint  Other Brace/Splint left UE splint/radius fx  Restrictions  Weight Bearing Restrictions Yes  LUE Weight Bearing WBAT  LLE Weight Bearing PWB  LLE Partial Weight Bearing Percentage or Pounds 50%  Cognition  Arousal/Alertness Awake/alert  Behavior During Therapy WFL for tasks assessed/performed  Overall Cognitive Status Within Functional Limits for tasks assessed  Bed Mobility  Bed Mobility Not assessed  Transfers  Transfers Sit to Stand;Stand to Sit  Sit to Stand 4: Min guard  Stand to Sit 4: Min guard  Details for Transfer Assistance cues for L UE management, and assist with balance on intial stand;   Ambulation/Gait  Ambulation/Gait Assistance 4: Min guard  Ambulation Distance (Feet) 100 Feet  Assistive device Left platform walker  Ambulation/Gait Assistance Details cues for PWB, sequence, upward gaze  Gait Pattern Step-to pattern  Static Standing Balance  Static Standing - Balance Support Left upper extremity supported  Static Standing - Level of Assistance 4: Min assist  Total Joint Exercises  Ankle Circles/Pumps AROM;10 reps;Both  Long Arc Quad AROM;Left;15 reps  PT - End of Session  Activity Tolerance Patient tolerated treatment well  Patient left in chair;with call bell/phone within reach  PT - Assessment/Plan  Comments on Treatment Session pt doing well;   PT Plan Discharge plan remains appropriate;Frequency remains appropriate  PT Frequency 7X/week  Follow Up Recommendations SNF  PT equipment None recommended by PT  Acute Rehab PT Goals  Time For Goal Achievement 11/11/12  Potential to Achieve Goals Good   Pt will go Sit to Stand with supervision  PT Goal: Sit to Stand - Progress Progressing toward goal  Pt will go Stand to Sit with supervision  PT Goal: Stand to Sit - Progress Progressing toward goal  Pt will Ambulate 51 - 150 feet;with supervision;with rolling walker  PT Goal: Ambulate - Progress Progressing toward goal  PT General Charges  $$ ACUTE PT VISIT 1 Procedure  PT Treatments  $Gait Training 23-37 mins

## 2012-11-04 NOTE — Progress Notes (Signed)
Subjective: 1 Day Post-Op Procedure(s) (LRB): CANNULATED HIP PINNING (Left) Patient reports pain as 3 on 0-10 scale. Doing Well today.  Objective: Vital signs in last 24 hours: Temp:  [97.4 F (36.3 C)-99.3 F (37.4 C)] 98.5 F (36.9 C) (06/12 0448) Pulse Rate:  [66-78] 70 (06/12 0448) Resp:  [11-18] 16 (06/12 0448) BP: (145-184)/(64-78) 173/73 mmHg (06/12 0448) SpO2:  [92 %-100 %] 99 % (06/12 0448)  Intake/Output from previous day: 06/11 0701 - 06/12 0700 In: 3836.7 [P.O.:500; I.V.:3336.7] Out: 4100 [Urine:3900; Blood:200] Intake/Output this shift:     Recent Labs  11/02/12 1720 11/04/12 0438  HGB 11.4* 11.0*    Recent Labs  11/02/12 1720 11/04/12 0438  WBC 5.5 8.3  RBC 4.00 4.00  HCT 32.8* 33.1*  PLT 344 298    Recent Labs  11/02/12 1720 11/04/12 0438  NA 127* 129*  K 4.0 4.4  CL 92* 94*  CO2 23 25  BUN 10 9  CREATININE 0.45* 0.47*  GLUCOSE 100* 152*  CALCIUM 9.6 9.5    Recent Labs  11/03/12 1215 11/04/12 0438  INR 1.39 1.18    Neurologically intact Dorsiflexion/Plantar flexion intact  Assessment/Plan: 1 Day Post-Op Procedure(s) (LRB): CANNULATED HIP PINNING (Left) Up with therapy  Rishawn Walck A 11/04/2012, 7:10 AM

## 2012-11-05 DIAGNOSIS — D62 Acute posthemorrhagic anemia: Secondary | ICD-10-CM | POA: Diagnosis not present

## 2012-11-05 LAB — BASIC METABOLIC PANEL
BUN: 12 mg/dL (ref 6–23)
Chloride: 95 mEq/L — ABNORMAL LOW (ref 96–112)
GFR calc Af Amer: >90 mL/min (ref >90)
GFR calc non Af Amer: >90 mL/min (ref >90)
Potassium: 4 mEq/L (ref 3.5–5.1)
Sodium: 129 mEq/L — ABNORMAL LOW (ref 135–145)

## 2012-11-05 LAB — CBC
MCHC: 33.4 g/dL (ref 30.0–36.0)
Platelets: 315 10*3/uL (ref 150–400)
RDW: 14.2 % (ref 11.5–15.5)
WBC: 8.1 10*3/uL (ref 4.0–10.5)

## 2012-11-05 LAB — PROTIME-INR
INR: 1.77 — ABNORMAL HIGH (ref 0.00–1.49)
Prothrombin Time: 20 seconds — ABNORMAL HIGH (ref 11.6–15.2)

## 2012-11-05 MED ORDER — DSS 100 MG PO CAPS: 100.0000 mg | ORAL_CAPSULE | Freq: Two times a day (BID) | ORAL | Status: DC

## 2012-11-05 MED ORDER — METHOCARBAMOL 500 MG PO TABS: 500.0000 mg | ORAL_TABLET | Freq: Four times a day (QID) | ORAL | Status: DC | PRN

## 2012-11-05 MED ORDER — POLYETHYLENE GLYCOL 3350 17 G PO PACK: 17.0000 g | PACK | Freq: Every day | ORAL | Status: DC | PRN

## 2012-11-05 MED ORDER — ENOXAPARIN SODIUM 100 MG/ML ~~LOC~~ SOLN: 85.0000 mg | Freq: Every day | SUBCUTANEOUS | Status: DC

## 2012-11-05 MED ORDER — ALUM & MAG HYDROXIDE-SIMETH 200-200-20 MG/5ML PO SUSP: 30.0000 mL | ORAL | Status: DC | PRN

## 2012-11-05 MED ORDER — ONDANSETRON HCL 4 MG PO TABS: 4.0000 mg | ORAL_TABLET | Freq: Four times a day (QID) | ORAL | Status: DC | PRN

## 2012-11-05 MED ORDER — OXYCODONE-ACETAMINOPHEN 5-325 MG PO TABS: 1.0000 | ORAL_TABLET | ORAL | Status: DC | PRN

## 2012-11-05 MED ORDER — BISACODYL 10 MG RE SUPP: 10.0000 mg | Freq: Every day | RECTAL | Status: DC | PRN

## 2012-11-05 MED ORDER — DOCUSATE SODIUM 100 MG PO CAPS
100.0000 mg | ORAL_CAPSULE | Freq: Two times a day (BID) | ORAL | Status: DC
  Administered 2012-11-05: 100 mg via ORAL

## 2012-11-05 MED ORDER — ACETAMINOPHEN 650 MG RE SUPP: 650.0000 mg | Freq: Four times a day (QID) | RECTAL | Status: DC | PRN

## 2012-11-05 MED ORDER — ENOXAPARIN SODIUM 100 MG/ML ~~LOC~~ SOLN
85.0000 mg | Freq: Every day | SUBCUTANEOUS | Status: DC
  Administered 2012-11-05: 85 mg via SUBCUTANEOUS
  Filled 2012-11-05: qty 1

## 2012-11-05 NOTE — Progress Notes (Signed)
Subjective: 2 Days Post-Op Procedure(s) (LRB): CANNULATED HIP PINNING (Left) Patient reports pain as 2 on 0-10 scale. Will give Lovenox to cover her until her Coumadin stabilizes.Pharmacy consulted.   Objective: Vital signs in last 24 hours: Temp:  [97.4 F (36.3 C)-97.9 F (36.6 C)] 97.4 F (36.3 C) (06/12 2100) Pulse Rate:  [69-76] 69 (06/12 2100) Resp:  [16] 16 (06/12 2100) BP: (131-168)/(62-72) 145/72 mmHg (06/12 2100) SpO2:  [94 %-99 %] 95 % (06/12 2100)  Intake/Output from previous day: 06/12 0701 - 06/13 0700 In: 1560 [P.O.:600; I.V.:960] Out: 1300 [Urine:1300] Intake/Output this shift:     Recent Labs  11/02/12 1720 11/04/12 0438 11/05/12 0500  HGB 11.4* 11.0* 9.7*    Recent Labs  11/04/12 0438 11/05/12 0500  WBC 8.3 8.1  RBC 4.00 3.51*  HCT 33.1* 29.0*  PLT 298 315    Recent Labs  11/04/12 0438 11/05/12 0500  NA 129* 129*  K 4.4 4.0  CL 94* 95*  CO2 25 24  BUN 9 12  CREATININE 0.47* 0.50  GLUCOSE 152* 123*  CALCIUM 9.5 8.9    Recent Labs  11/04/12 0438 11/05/12 0500  INR 1.18 1.77*    Neurologically intact Dorsiflexion/Plantar flexion intact  Assessment/Plan: 2 Days Post-Op Procedure(s) (LRB): CANNULATED HIP PINNING (Left) Discharge to SNF  Wendelin Bradt A 11/05/2012, 7:11 AM

## 2012-11-05 NOTE — Discharge Summary (Signed)
Physician Discharge Summary  Jodi Frank ZOX:[id number] DOB: [birth year] DOA: 11/02/2012  PCP: Jon Gills, MD  Admit date: 11/02/2012 Discharge date: 11/05/2012  Time spent: 35 minutes  Recommendations for Outpatient Follow-up:  1. Lovenox/coumadin until INR 2.5-3.5  Discharge Diagnoses:  Active Problems:   Acute blood loss anemia   Discharge Condition: improved  Diet recommendation: cardiac  Filed Weights   11/02/12 2106  Weight: 58.06 kg (128 lb)    History of present illness:  This is a 72 y.o. 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 Patient noted have been seen in the ER about 2-3 days ago for a fall. Patient had a left distal radius fracture at the time. Patient states she had some mild hip pain time however was fairly insignificant. A x-ray was then obtained at the time. Patient has followup with [facility] orthopedics today about her left wrist when she complained about her left hip. A left hip x-ray was obtained showing an impacted left hip fracture. Patient was sent to the ER for admission as plan is for surgery for hip fracture.  Patient noted to be on Coumadin for mechanical mitral valve. Patient denies any bleeding, weakness, excessive bruising.  Patient denies any headache, dizziness, shortness of breath, chest pain prior to the fall. Patient states that she fell on an object leg night which was initial cause for the fall to 3 days ago.   Hospital Course:  L hip fracture:  [facility]  S/p surgery on 6/11  Ambulate   L distal radius fracture  -splinted   HTN: Elevated BP in setting of hip fracture. No cardiac complaints. Continue home meds. Titrate meds as clinically indicated. Added PRN hydralazine   Mitral Valve Replacement:  Followed by [facility]. No cardiac complaints currently. No other known cardiac issues. lovenox bridged to  therapeutic INR  Hyponatremia: Pt states that this is a chronic issue.     Procedures:  Surgery 6/11  Consultations:  ortho  Discharge Exam: Filed Vitals:   11/04/12 1300 11/04/12 1600 11/04/12 1730 11/04/12 2100  BP: 131/62  168/71 145/72  Pulse: 71   69  Temp: 97.8 F (36.6 C)   97.4 F (36.3 C)  TempSrc: Oral   Oral  Resp: 16 16  16   Height:      Weight:      SpO2: 94% 96%  95%    General: A+Ox3, NAD Cardiovascular:rrr Respiratory: clear  Discharge Instructions  Discharge Orders   Future Orders Complete By Expires     Diet - low sodium heart healthy  As directed     Discharge instructions  As directed     Comments:      INR Saturday- continue lovenox and coumadin until INR between 2.5-3.5- then d/c lovenox and continue coumadin CBC 1 week        Medication List    STOP taking these medications       acetaminophen 500 MG tablet  Commonly known as:  TYLENOL      TAKE these medications       acetaminophen 650 MG suppository  Commonly known as:  TYLENOL  Place 1 suppository (650 mg total) rectally every 6 (six) hours as needed.     alum & mag hydroxide-simeth 200-200-20 MG/5ML suspension  Commonly known as:  MAALOX/MYLANTA  Take 30 mLs by mouth every 4 (four) hours as needed for indigestion.     amLODipine  5 MG tablet  Commonly known as:  NORVASC  Take 5 mg by mouth every evening.     bisacodyl 10 MG suppository  Commonly known as:  DULCOLAX  Place 1 suppository (10 mg total) rectally daily as needed.     DSS 100 MG Caps  Take 100 mg by mouth 2 (two) times daily.     enoxaparin 100 MG/ML injection  Commonly known as:  LOVENOX  Inject 0.85 mLs (85 mg total) into the skin daily.     fish oil-omega-3 fatty acids 1000 MG capsule  Take 2 g by mouth 2 (two) times daily.     GENTEAL 0.25-0.3 % Gel  Generic drug:  Carboxymethylcell-Hypromellose  Apply 1 application to eye every evening.     labetalol 300 MG tablet  Commonly known as:   NORMODYNE  Take 600 mg by mouth 2 (two) times daily.     methocarbamol 500 MG tablet  Commonly known as:  ROBAXIN  Take 1 tablet (500 mg total) by mouth every 6 (six) hours as needed.     multivitamin with minerals Tabs  Take 1 tablet by mouth daily.     ondansetron 4 MG tablet  Commonly known as:  ZOFRAN  Take 1 tablet (4 mg total) by mouth every 6 (six) hours as needed for nausea.     oxyCODONE-acetaminophen 5-325 MG per tablet  Commonly known as:  PERCOCET/ROXICET  Take 1 tablet by mouth every 4 (four) hours as needed for pain.     polyethylene glycol packet  Commonly known as:  MIRALAX / GLYCOLAX  Take 17 g by mouth daily as needed.     venlafaxine XR 150 MG 24 hr capsule  Commonly known as:  EFFEXOR-XR  Take 150 mg by mouth every morning.     warfarin 10 MG tablet  Commonly known as:  COUMADIN  Take 10 mg by mouth See admin instructions. Only on Tuesday evenings     warfarin 7.5 MG tablet  Commonly known as:  COUMADIN  Take 7.5 mg by mouth as directed. Take every night except Tuesday       No Known Allergies     Follow-up Information   Follow up with GIOFFRE,RONALD A, MD. Schedule an appointment as soon as possible for a visit in 2 weeks.   Contact information:   [address], [place] [address] [place] [address] [phone]       Follow up with CASSIDY-VU,LISA, MD In 1 week.   Contact information:   [facility] [address] [place] [address] [phone]        The results of significant diagnostics from this hospitalization (including imaging, microbiology, ancillary and laboratory) are listed below for reference.    Significant Diagnostic Studies: Dg Wrist 2 Views Left  10/29/2012   *RADIOLOGY REPORT*  Clinical Data: Distal radius fracture.  Post reduction film.  LEFT WRIST - 2 VIEW  Comparison: Plain films 10/29/2012 at 7:28 a.m.  Findings: The patient is now in a plaster cast.  Distal radius  fracture with dorsal angulation is again seen.  Position and alignment are mildly improved.  Distal ulnar fracture seen on the comparison study is not well demonstrated on this exam.  The patient is in a fiberglass cast.  IMPRESSION: Mild improvement in position and alignment of a dorsally angulated distal radius fracture.  No new abnormality.   Original Report Authenticated By: Holley Dexter, M.D.   Dg Wrist Complete  Left  10/29/2012   *RADIOLOGY REPORT*  Clinical Data: Left wrist pain secondary to a fall today. Swelling.  LEFT WRIST - COMPLETE 3+ VIEW  Comparison: None.  Findings: There is a slightly dorsally impacted comminuted fracture of the distal radius involving the articular surface.  There is slight cortical irregularity of the distal ulna medially but I do not see a discrete fracture line.  Diffuse osteopenia.  IMPRESSION: Comminuted slightly dorsally impacted fracture of the distal radius.   Original Report Authenticated By: Francene Boyers, M.D.   Dg Hip Operative Left  11/03/2012   *RADIOLOGY REPORT*  Clinical Data: 72 year old female ORIF left hip.  Fluoroscopy time:  0 minutes 30 seconds.  OPERATIVE LEFT HIP  Comparison: 07/23/2008.  Findings: Two intraoperative fluoroscopic views of the proximal left femur.  Two cannulated screws have been placed traversing a proximal left femoral neck fracture.  Alignment appears near anatomic.  Hardware intact.  IMPRESSION: ORIF proximal left femur with no adverse features.   Original Report Authenticated By: Erskine Speed, M.D.   Ct Head Wo Contrast  10/29/2012   *RADIOLOGY REPORT*  Clinical Data: Head trauma secondary to a fall this morning.  The patient is on Coumadin.  CT HEAD WITHOUT CONTRAST  Technique:  Contiguous axial images were obtained from the base of the skull through the vertex without contrast.  Comparison: None.  Findings: There is no acute intracranial hemorrhage, infarction, or mass lesion.  Brain parenchyma appears normal.  Osseous  structures are normal.  Slight scalp swelling over the left parietal bone which may be due to remote trauma.  The patient reports that she struck the left posterior parietal-occipital region.  IMPRESSION: No significant abnormality.   Original Report Authenticated By: Francene Boyers, M.D.   Dg Chest Port 1 View  11/02/2012   *RADIOLOGY REPORT*  Clinical Data: Left wrist and hip fractures.  Preoperative respiratory exam.  PORTABLE CHEST - 1 VIEW  Comparison: None.  Findings: The patient is status post prior median sternotomy. Heart size and mediastinal contours are within normal limits. Lungs show no evidence of edema, consolidation or significant pleural fluid.  Scarring and minimal subsegmental atelectasis is present at both lung bases.  The visualized bony thorax is unremarkable.  IMPRESSION: No active disease.   Original Report Authenticated By: Irish Lack, M.D.    Microbiology: Recent Results (from the past 240 hour(s))  SURGICAL PCR SCREEN     Status: None   Collection Time    11/03/12  3:56 PM      Result Value Range Status   MRSA, PCR NEGATIVE  NEGATIVE Final   Staphylococcus aureus NEGATIVE  NEGATIVE Final   Comment:            The Xpert SA Assay (FDA     approved for NASAL specimens     in patients over 1 years of age),     is one component of     a comprehensive surveillance     program.  Test performance has     been validated by [employer] for patients greater     than or equal to 15 year old.     It is not intended     to diagnose infection nor to     guide or monitor treatment.     Labs: Basic Metabolic Panel:  Recent Labs Lab 11/02/12 1720 11/04/12 0438 11/05/12 0500  NA 127* 129* 129*  K 4.0 4.4 4.0  CL 92* 94* 95*  CO2 23 25 24   GLUCOSE 100* 152* 123*  BUN 10 9 12   CREATININE 0.45* 0.47* 0.50  CALCIUM 9.6 9.5 8.9   Liver Function Tests: No results found for this basename: AST, ALT, ALKPHOS, BILITOT, PROT, ALBUMIN,  in the last 168 hours No  results found for this basename: LIPASE, AMYLASE,  in the last 168 hours No results found for this basename: AMMONIA,  in the last 168 hours CBC:  Recent Labs Lab 11/02/12 1720 11/04/12 0438 11/05/12 0500  WBC 5.5 8.3 8.1  NEUTROABS 3.7  --   --   HGB 11.4* 11.0* 9.7*  HCT 32.8* 33.1* 29.0*  MCV 82.0 82.8 82.6  PLT 344 298 315   Cardiac Enzymes: No results found for this basename: CKTOTAL, CKMB, CKMBINDEX, TROPONINI,  in the last 168 hours BNP: BNP (last 3 results) No results found for this basename: PROBNP,  in the last 8760 hours CBG: No results found for this basename: GLUCAP,  in the last 168 hours     Signed:  Benjamine Mola, Grover Robinson  [facility] 11/05/2012, 8:25 AM

## 2012-11-05 NOTE — Progress Notes (Signed)
Physical Therapy Treatment Patient Details Name: Jodi Frank MRN: [id number] DOB: 04-17-1941 Today's Date: 11/05/2012 Time: 4098-1191 PT Time Calculation (min): 28 min  PT Assessment / Plan / Recommendation Comments on Treatment Session  pt doing well, making excellent progress, will benefit from SNF stay; still requiring cues for PWB LLE;  to go to [facility] today    Follow Up Recommendations  SNF     Does the patient have the potential to tolerate intense rehabilitation     Barriers to Discharge        Equipment Recommendations  None recommended by PT    Recommendations for Other Services    Frequency 7X/week   Plan Discharge plan remains appropriate;Frequency remains appropriate    Precautions / Restrictions Precautions Precautions: Fall Required Braces or Orthoses: Other Brace/Splint Other Brace/Splint: left UE splint/radius fx Restrictions LUE Weight Bearing: Weight bearing as tolerated LLE Weight Bearing: Partial weight bearing LLE Partial Weight Bearing Percentage or Pounds: 50%   Pertinent Vitals/Pain Pt reports slight right  Wrist pain, controlled per pt;    Mobility  Bed Mobility Bed Mobility: Not assessed Transfers Transfers: Sit to Stand;Stand to Sit Sit to Stand: 4: Min guard;From chair/3-in-1 Stand to Sit: 4: Min guard;To chair/3-in-1 Details for Transfer Assistance: cues for L UE management, and assist with balance on intial stand;  Ambulation/Gait Ambulation/Gait Assistance: 4: Min guard Ambulation Distance (Feet): 160 Feet Assistive device: Left platform walker Ambulation/Gait Assistance Details: cues for PWB Gait Pattern: Step-to pattern    Exercises Total Joint Exercises Ankle Circles/Pumps: AROM;10 reps;Both Towel Squeeze: 15 reps;Both;Strengthening;Seated Long Arc Quad: AROM;Left;15 reps   PT Diagnosis:    PT Problem List:   PT Treatment Interventions:     PT Goals Acute Rehab PT Goals Time For Goal Achievement: 11/11/12 Potential  to Achieve Goals: Good Pt will go Sit to Stand: with supervision PT Goal: Sit to Stand - Progress: Progressing toward goal Pt will go Stand to Sit: with supervision PT Goal: Stand to Sit - Progress: Progressing toward goal Pt will Ambulate: 51 - 150 feet;with supervision;with rolling walker PT Goal: Ambulate - Progress: Progressing toward goal  Visit Information  Last PT Received On: 11/05/12 Assistance Needed: +1    Subjective Data  Subjective: pt in chair Patient Stated Goal: rehab   Cognition  Cognition Arousal/Alertness: Awake/alert Behavior During Therapy: WFL for tasks assessed/performed Overall Cognitive Status: Within Functional Limits for tasks assessed    Balance  Dynamic Sitting Balance Dynamic Sitting - Balance Support: Feet unsupported;During functional activity;No upper extremity supported Dynamic Sitting - Level of Assistance: 5: Stand by assistance  End of Session PT - End of Session Activity Tolerance: Patient tolerated treatment well Patient left: in chair;with call bell/phone within reach Nurse Communication: Mobility status   GP     [facility] 11/05/2012, 11:54 AM

## 2012-11-05 NOTE — Progress Notes (Signed)
ANTICOAGULATION CONSULT NOTE - Initial Consult  Pharmacy Consult for Lovenox, Coumadin Indication: mechanical mitral valve, s/p ortho procedure  No Known Allergies  Patient Measurements: Height: 5\' 4"  (162.6 cm) Weight: 128 lb (Jodi.06 kg) IBW/kg (Calculated) : 54.7  Vital Signs: Temp: 97.4 F (36.3 C) (06/12 2100) Temp src: Oral (06/12 2100) BP: 145/72 mmHg (06/12 2100) Pulse Rate: 69 (06/12 2100)  Labs:  Recent Labs  11/02/12 1720  11/03/12 1215 11/04/12 0438 11/04/12 1318 11/04/12 2057 11/05/12 0500  HGB 11.4*  --   --  11.0*  --   --  9.7*  HCT 32.8*  --   --  33.1*  --   --  29.0*  PLT 344  --   --  298  --   --  315  LABPROT 36.6*  < > 16.7* 14.8  --   --  20.0*  INR 4.00*  < > 1.39 1.18  --   --  1.77*  HEPARINUNFRC  --   --   --   --  0.14* 0.22*  --   CREATININE 0.45*  --   --  0.47*  --   --  0.50  < > = values in this interval not displayed.  Estimated Creatinine Clearance: 56.5 ml/min (by C-G formula based on Cr of 0.5).   Medical History: Past Medical History  Diagnosis Date  . Depression   . Diarrhea   . 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 Hypertension   . Broken wrist     Assessment: 72 yo on chronic Coumadin PTA for mechanical MVR Frank 6/10 with hip fracture.  PTA Coumadin dose was reportedly 7.5mg  daily except 10mg  on Tuesdays - last taken 6/9. INR = 4 on admit. Patient received Vitamin K 10 mg IV x 1 on 6/10, Vitamin K 5 mg IV x 1 6/11 and INR trended down to 1.39 for surgery 6/11 PM. Patient was started on IV heparin 12 hours after AET along with Coumadin bridge. Today 6/13, pt is ready for discharge to SNF - MD ordered for pharmacy to stop IV heparin, start Lovenox bridge with Coumadin until INR therapeutic.  INR 1.77 (up quicker than anticipated given recent Vitamin K administration).  Confirmed with patient, INR goal is 2.5 - 3.5. Heparin level was scheduled for 0800 this morning but this has been discontinued (lab notified).    Pt weighs Jodi.1 kg, will dose Lovenox using 1.5mg /kg q24h for patient's convenience.  Hgb 9.7, no bleeding documented or per patient.   Goal of Therapy:  INR 2.5 - 3.5 Monitor platelets by anticoagulation protocol: Yes   Plan:   D/C IV heparin and related labs   One (1) hour after heparin discontinuation, give Lovenox 85 mg sq q24h  Pharmacy will not enter Coumadin dose tonight since plan is for discharge. Recommend continuing home regimen of Coumadin 7.5mg  daily EXCEPT 10 mg on Tuesdays. Please consider INR check tomorrow (6/14) if possible given INR jump from 1.18 to 1.77 today.  At the latest, please get INR check by Monday.    RN, please notify pharmacy for Coumadin dose if patient is still here by 1800 tonight.   RN and patient informed of plans  Geoffry Paradise, PharmD, BCPS Pager: [phone] 8:17 AM Pharmacy #: [phone]

## 2012-11-05 NOTE — Progress Notes (Signed)
CSW contacted by [employer]. They have received authorization from [employer] for SNF placement. CSW will contact SNF with d/c date once determined.  Cori Razor LCSW [phone]

## 2012-11-05 NOTE — Progress Notes (Signed)
Discharge summary sent to payer through [employer]  

## 2012-11-05 NOTE — Progress Notes (Signed)
Clinical Social Work Department CLINICAL SOCIAL WORK PLACEMENT NOTE 11/05/2012  Patient:  Jodi Frank, Jodi Frank  Account Number:  [id number] Admit date:  11/02/2012  Clinical Social Worker:  Cori Razor, LCSW  Date/time:  11/04/2012 12:31 PM  Clinical Social Work is seeking post-discharge placement for this patient at the following level of care:   SKILLED NURSING   (*CSW will update this form in Epic as items are completed)     Patient/family provided with [facility] Department of Clinical Social Work's list of facilities offering this level of care within the geographic area requested by the patient (or if unable, by the patient's family).  11/04/2012  Patient/family informed of their freedom to choose among providers that offer the needed level of care, that participate in Medicare, Medicaid or managed care program needed by the patient, have an available bed and are willing to accept the patient.    Patient/family informed of [facility]' ownership interest in [facility], as well as of the fact that they are under no obligation to receive care at this facility.  PASARR submitted to EDS on 11/04/2012 PASARR number received from [employer] on 11/04/2012  FL2 transmitted to all facilities in geographic area requested by pt/family on  11/04/2012 FL2 transmitted to all facilities within larger geographic area on   Patient informed that his/her managed care company has contracts with or will negotiate with  certain facilities, including the following:     Patient/family informed of bed offers received:  11/04/2012 Patient chooses bed at [facility] Physician recommends and patient chooses bed at    Patient to be transferred to [facility] on  11/05/2012 Patient to be transferred to facility by [facility]  The following physician request were entered in Epic:   Additional Comments:   [employer] priovided authorization for SNF placement prior to d/c.  Cori Razor LCSW  [phone]

## 2012-11-08 ENCOUNTER — Other Ambulatory Visit: Payer: Self-pay | Admitting: Geriatric Medicine

## 2012-11-08 MED ORDER — OXYCODONE-ACETAMINOPHEN 5-325 MG PO TABS: ORAL_TABLET | ORAL | Status: DC

## 2012-11-10 ENCOUNTER — Encounter: Payer: Self-pay | Admitting: Adult Health

## 2012-11-10 ENCOUNTER — Non-Acute Institutional Stay (SKILLED_NURSING_FACILITY): Payer: PRIVATE HEALTH INSURANCE | Admitting: Adult Health

## 2012-11-10 DIAGNOSIS — D62 Acute posthemorrhagic anemia: Secondary | ICD-10-CM

## 2012-11-10 DIAGNOSIS — S52502D Unspecified fracture of the lower end of left radius, subsequent encounter for closed fracture with routine healing: Secondary | ICD-10-CM

## 2012-11-10 DIAGNOSIS — S5290XD Unspecified fracture of unspecified forearm, subsequent encounter for closed fracture with routine healing: Secondary | ICD-10-CM

## 2012-11-10 DIAGNOSIS — G4700 Insomnia, unspecified: Secondary | ICD-10-CM

## 2012-11-10 DIAGNOSIS — K5900 Constipation, unspecified: Secondary | ICD-10-CM

## 2012-11-10 DIAGNOSIS — M62838 Other muscle spasm: Secondary | ICD-10-CM

## 2012-11-10 DIAGNOSIS — F3289 Other specified depressive episodes: Secondary | ICD-10-CM

## 2012-11-10 DIAGNOSIS — Z7901 Long term (current) use of anticoagulants: Secondary | ICD-10-CM

## 2012-11-10 DIAGNOSIS — S52502A Unspecified fracture of the lower end of left radius, initial encounter for closed fracture: Secondary | ICD-10-CM | POA: Insufficient documentation

## 2012-11-10 DIAGNOSIS — S72002E Fracture of unspecified part of neck of left femur, subsequent encounter for open fracture type I or II with routine healing: Secondary | ICD-10-CM

## 2012-11-10 DIAGNOSIS — S72002A Fracture of unspecified part of neck of left femur, initial encounter for closed fracture: Secondary | ICD-10-CM | POA: Insufficient documentation

## 2012-11-10 DIAGNOSIS — I10 Essential (primary) hypertension: Secondary | ICD-10-CM

## 2012-11-10 DIAGNOSIS — S72009A Fracture of unspecified part of neck of unspecified femur, initial encounter for closed fracture: Secondary | ICD-10-CM | POA: Insufficient documentation

## 2012-11-10 DIAGNOSIS — F329 Major depressive disorder, single episode, unspecified: Secondary | ICD-10-CM

## 2012-11-10 DIAGNOSIS — S72009D Fracture of unspecified part of neck of unspecified femur, subsequent encounter for closed fracture with routine healing: Secondary | ICD-10-CM

## 2012-11-10 NOTE — Progress Notes (Signed)
  Subjective:    Patient ID: Jodi Frank, female    DOB: Mar 05, 1941, 72 y.o.   MRN: [id number]  HPI This is a 72 year old female who has been admitted to [facility] on 11/05/12 from [facility]. She had a fall at home sustaining a left hip fracture and left distal radius fracture S/P cannulated left hip pinning and splinting of left forearm. She requested for her FMLA form to be completed. She has been admitted for a short-term rehabilitation.   Review of Systems  Constitutional: Negative.   HENT: Negative.   Eyes: Negative.   Respiratory: Negative for cough and shortness of breath.   Cardiovascular: Negative for leg swelling.  Gastrointestinal: Negative.   Endocrine: Negative.   Genitourinary: Negative.   Neurological: Negative.   Hematological: Negative for adenopathy. Does not bruise/bleed easily.  Psychiatric/Behavioral: Positive for sleep disturbance.       Objective:   Physical Exam  Nursing note and vitals reviewed. Constitutional: She is oriented to person, place, and time. She appears well-developed and well-nourished.  HENT:  Head: Normocephalic and atraumatic.  Right Ear: External ear normal.  Left Ear: External ear normal.  Eyes: Conjunctivae and EOM are normal. Pupils are equal, round, and reactive to light.  Neck: Normal range of motion. Neck supple.  Cardiovascular: Normal rate, regular rhythm and normal heart sounds.   Pulmonary/Chest: Effort normal and breath sounds normal.  Abdominal: Soft. Bowel sounds are normal.  Musculoskeletal: She exhibits no edema and no tenderness.  Neurological: She is alert and oriented to person, place, and time.  Skin: Skin is warm and dry.  Psychiatric: She has a normal mood and affect. Her behavior is normal. Judgment and thought content normal.     LABS: 11/08/12  Wbc 5.0  hgb 9.8  hct 29.6  Bmp nl except NA 132 11/05/12  NA 129  K 4.0  Glucose 123  BUN 12  Creatinine 0.50  Calcium 8.9  Wbc 8.1  hgb 9.7  hct  29.0   Medications reviewed per [facility]     Assessment & Plan:   Hip fracture, left S/P cannulated hip pinning - for PT and OT  Insomnia - start on Ambien 5 mg 1 tab PO Q HS PRN  Muscle spasm - add Robaxin 500 mg 1 tab PO Q 10 PM  Essential hypertension, benign - well-controlled  Constipation - no complaints  Distal radius fracture, left - continue OT and splint  Acute blood loss anemia - start FeSO4 325 mg 1 tab PO Q D  DEPRESSION - stable  Long-term use of Anticoagulant -  INR = 2.9 ; continue Coumadin 7.5 mg PO Q D except on Tuesdays when she gets Coumadin 10 mg PO Q D and repeat INR on 11/12/12

## 2012-11-10 NOTE — Progress Notes (Signed)
This encounter was created in error - please disregard.

## 2012-11-11 ENCOUNTER — Non-Acute Institutional Stay (SKILLED_NURSING_FACILITY): Payer: Medicare Other | Admitting: Internal Medicine

## 2012-11-11 DIAGNOSIS — S72009S Fracture of unspecified part of neck of unspecified femur, sequela: Secondary | ICD-10-CM

## 2012-11-11 DIAGNOSIS — S72002S Fracture of unspecified part of neck of left femur, sequela: Secondary | ICD-10-CM

## 2012-11-11 DIAGNOSIS — D62 Acute posthemorrhagic anemia: Secondary | ICD-10-CM

## 2012-11-11 DIAGNOSIS — I10 Essential (primary) hypertension: Secondary | ICD-10-CM

## 2012-11-11 DIAGNOSIS — S52502S Unspecified fracture of the lower end of left radius, sequela: Secondary | ICD-10-CM

## 2012-11-11 DIAGNOSIS — S42309S Unspecified fracture of shaft of humerus, unspecified arm, sequela: Secondary | ICD-10-CM

## 2012-11-16 ENCOUNTER — Encounter (HOSPITAL_COMMUNITY): Payer: Self-pay

## 2012-11-18 ENCOUNTER — Encounter: Payer: Self-pay | Admitting: Adult Health

## 2012-11-23 NOTE — Progress Notes (Signed)
I spoke with Jodi Frank at [facility] and I also called [name] t speak with her and only got voice mail.

## 2012-11-24 ENCOUNTER — Encounter (HOSPITAL_COMMUNITY)
Admission: RE | Disposition: A | Discharge status: Home Health | Payer: Self-pay | Source: Ambulatory Visit | Attending: Orthopedic Surgery

## 2012-11-24 ENCOUNTER — Ambulatory Visit (HOSPITAL_COMMUNITY): Payer: PRIVATE HEALTH INSURANCE

## 2012-11-24 ENCOUNTER — Ambulatory Visit (HOSPITAL_COMMUNITY): Payer: PRIVATE HEALTH INSURANCE | Admitting: Certified Registered"

## 2012-11-24 ENCOUNTER — Encounter (HOSPITAL_COMMUNITY): Payer: Self-pay | Admitting: Certified Registered"

## 2012-11-24 ENCOUNTER — Ambulatory Visit (HOSPITAL_COMMUNITY)
Admission: RE | Admit: 2012-11-24 | Discharge: 2012-11-28 | Disposition: A | Discharge status: Home Health | Payer: PRIVATE HEALTH INSURANCE | Source: Ambulatory Visit | Attending: Orthopedic Surgery | Admitting: Orthopedic Surgery

## 2012-11-24 DIAGNOSIS — Y834 Other reconstructive surgery as the cause of abnormal reaction of the patient, or of later complication, without mention of misadventure at the time of the procedure: Secondary | ICD-10-CM | POA: Insufficient documentation

## 2012-11-24 DIAGNOSIS — F329 Major depressive disorder, single episode, unspecified: Secondary | ICD-10-CM | POA: Insufficient documentation

## 2012-11-24 DIAGNOSIS — Z954 Presence of other heart-valve replacement: Secondary | ICD-10-CM | POA: Insufficient documentation

## 2012-11-24 DIAGNOSIS — I10 Essential (primary) hypertension: Secondary | ICD-10-CM | POA: Insufficient documentation

## 2012-11-24 DIAGNOSIS — F3289 Other specified depressive episodes: Secondary | ICD-10-CM | POA: Insufficient documentation

## 2012-11-24 DIAGNOSIS — Z7901 Long term (current) use of anticoagulants: Secondary | ICD-10-CM | POA: Insufficient documentation

## 2012-11-24 DIAGNOSIS — D62 Acute posthemorrhagic anemia: Secondary | ICD-10-CM | POA: Insufficient documentation

## 2012-11-24 DIAGNOSIS — IMO0002 Reserved for concepts with insufficient information to code with codable children: Secondary | ICD-10-CM | POA: Insufficient documentation

## 2012-11-24 DIAGNOSIS — W19XXXA Unspecified fall, initial encounter: Secondary | ICD-10-CM | POA: Insufficient documentation

## 2012-11-24 DIAGNOSIS — Z79899 Other long term (current) drug therapy: Secondary | ICD-10-CM | POA: Insufficient documentation

## 2012-11-24 DIAGNOSIS — R197 Diarrhea, unspecified: Secondary | ICD-10-CM | POA: Insufficient documentation

## 2012-11-24 DIAGNOSIS — S52599A Other fractures of lower end of unspecified radius, initial encounter for closed fracture: Secondary | ICD-10-CM | POA: Insufficient documentation

## 2012-11-24 HISTORY — PX: OPEN REDUCTION INTERNAL FIXATION (ORIF) WRIST WITH ILIAC CREST BONE GRAFT: SHX5669

## 2012-11-24 LAB — BASIC METABOLIC PANEL
BUN: 12 mg/dL (ref 6–23)
Calcium: 9.8 mg/dL (ref 8.4–10.5)
GFR calc Af Amer: >90 mL/min (ref >90)
GFR calc non Af Amer: >90 mL/min (ref >90)
Glucose, Bld: 108 mg/dL — ABNORMAL HIGH (ref 70–99)
Potassium: 4.2 mEq/L (ref 3.5–5.1)
Sodium: 132 mEq/L — ABNORMAL LOW (ref 135–145)

## 2012-11-24 LAB — CBC
MCH: 28.5 pg (ref 26.0–34.0)
MCV: 82 fL (ref 78.0–100.0)
Platelets: 342 10*3/uL (ref 150–400)
RBC: 4.17 MIL/uL (ref 3.87–5.11)
RDW: 13.4 % (ref 11.5–15.5)

## 2012-11-24 LAB — PROTIME-INR: Prothrombin Time: 14 seconds (ref 11.6–15.2)

## 2012-11-24 SURGERY — OPEN REDUCTION INTERNAL FIXATION (ORIF) WRIST WITH ILIAC CREST BONE GRAFT
Anesthesia: General | Site: Wrist | Laterality: Left | Wound class: Clean

## 2012-11-24 MED ORDER — MORPHINE SULFATE 2 MG/ML IJ SOLN
1.0000 mg | INTRAMUSCULAR | Status: DC | PRN
Start: 2012-11-24 — End: 2012-11-28
  Administered 2012-11-24: 1 mg via INTRAVENOUS
  Filled 2012-11-24: qty 1

## 2012-11-24 MED ORDER — HYDROMORPHONE HCL PF 1 MG/ML IJ SOLN
INTRAMUSCULAR | Status: AC
  Administered 2012-11-24: 1 mg
  Filled 2012-11-24: qty 1

## 2012-11-24 MED ORDER — METHOCARBAMOL 500 MG PO TABS: 500.0000 mg | ORAL_TABLET | Freq: Four times a day (QID) | ORAL | Status: DC

## 2012-11-24 MED ORDER — ZOLPIDEM TARTRATE 5 MG PO TABS
5.0000 mg | ORAL_TABLET | Freq: Every evening | ORAL | Status: DC | PRN
  Administered 2012-11-26 – 2012-11-28 (×2): 5 mg via ORAL
  Filled 2012-11-24 (×2): qty 1

## 2012-11-24 MED ORDER — WARFARIN - PHARMACIST DOSING INPATIENT: Freq: Every day | Status: DC

## 2012-11-24 MED ORDER — OXYCODONE HCL 5 MG/5ML PO SOLN: 5.0000 mg | Freq: Once | ORAL | Status: DC | PRN

## 2012-11-24 MED ORDER — PROPOFOL 10 MG/ML IV BOLUS
INTRAVENOUS | Status: DC | PRN
  Administered 2012-11-24: 100 mg via INTRAVENOUS

## 2012-11-24 MED ORDER — DOCUSATE SODIUM 100 MG PO CAPS: 100.0000 mg | ORAL_CAPSULE | Freq: Two times a day (BID) | ORAL | Status: DC

## 2012-11-24 MED ORDER — HYDROMORPHONE HCL PF 1 MG/ML IJ SOLN
1.0000 mg | INTRAMUSCULAR | Status: DC | PRN
  Administered 2012-11-25 (×4): 1 mg via INTRAVENOUS
  Filled 2012-11-24 (×4): qty 1

## 2012-11-24 MED ORDER — ENOXAPARIN SODIUM 60 MG/0.6ML ~~LOC~~ SOLN
55.0000 mg | Freq: Two times a day (BID) | SUBCUTANEOUS | Status: DC
  Filled 2012-11-24 (×2): qty 0.6

## 2012-11-24 MED ORDER — LIDOCAINE HCL (CARDIAC) 20 MG/ML IV SOLN
INTRAVENOUS | Status: DC | PRN
  Administered 2012-11-24: 30 mg via INTRAVENOUS

## 2012-11-24 MED ORDER — METHOCARBAMOL 100 MG/ML IJ SOLN
500.0000 mg | Freq: Four times a day (QID) | INTRAVENOUS | Status: DC | PRN
  Administered 2012-11-25: 500 mg via INTRAVENOUS
  Filled 2012-11-24 (×2): qty 5

## 2012-11-24 MED ORDER — OXYCODONE-ACETAMINOPHEN 5-325 MG PO TABS
ORAL_TABLET | ORAL | Status: AC
  Filled 2012-11-24: qty 2

## 2012-11-24 MED ORDER — MIDAZOLAM HCL 5 MG/5ML IJ SOLN
INTRAMUSCULAR | Status: DC | PRN
  Administered 2012-11-24 (×2): 1 mg via INTRAVENOUS

## 2012-11-24 MED ORDER — HYDROMORPHONE HCL PF 1 MG/ML IJ SOLN
0.2500 mg | INTRAMUSCULAR | Status: DC | PRN
  Administered 2012-11-24 (×2): 0.5 mg via INTRAVENOUS

## 2012-11-24 MED ORDER — OXYCODONE HCL 5 MG PO TABS: 5.0000 mg | ORAL_TABLET | Freq: Once | ORAL | Status: DC | PRN

## 2012-11-24 MED ORDER — MEPERIDINE HCL 25 MG/ML IJ SOLN: 6.2500 mg | INTRAMUSCULAR | Status: DC | PRN

## 2012-11-24 MED ORDER — CEFAZOLIN SODIUM 1-5 GM-% IV SOLN
1.0000 g | Freq: Three times a day (TID) | INTRAVENOUS | Status: DC
  Administered 2012-11-25 – 2012-11-27 (×8): 1 g via INTRAVENOUS
  Filled 2012-11-24 (×9): qty 50

## 2012-11-24 MED ORDER — OXYCODONE HCL 5 MG PO TABS
ORAL_TABLET | ORAL | Status: AC
  Filled 2012-11-24: qty 1

## 2012-11-24 MED ORDER — OXYCODONE-ACETAMINOPHEN 5-325 MG PO TABS: 1.0000 | ORAL_TABLET | ORAL | Status: DC | PRN

## 2012-11-24 MED ORDER — DOCUSATE SODIUM 100 MG PO CAPS
100.0000 mg | ORAL_CAPSULE | Freq: Two times a day (BID) | ORAL | Status: DC
  Administered 2012-11-24 – 2012-11-28 (×4): 100 mg via ORAL
  Filled 2012-11-24 (×8): qty 1

## 2012-11-24 MED ORDER — HYDROMORPHONE HCL PF 1 MG/ML IJ SOLN
INTRAMUSCULAR | Status: AC
  Administered 2012-11-24: 0.5 mg via INTRAVENOUS
  Filled 2012-11-24: qty 1

## 2012-11-24 MED ORDER — ADULT MULTIVITAMIN W/MINERALS CH: 1.0000 | ORAL_TABLET | Freq: Every day | ORAL | Status: DC

## 2012-11-24 MED ORDER — ONDANSETRON HCL 4 MG PO TABS: 4.0000 mg | ORAL_TABLET | Freq: Four times a day (QID) | ORAL | Status: DC | PRN

## 2012-11-24 MED ORDER — ENOXAPARIN SODIUM 60 MG/0.6ML ~~LOC~~ SOLN
55.0000 mg | SUBCUTANEOUS | Status: AC
  Administered 2012-11-24: 55 mg via SUBCUTANEOUS
  Filled 2012-11-24: qty 0.6

## 2012-11-24 MED ORDER — METHOCARBAMOL 500 MG PO TABS
500.0000 mg | ORAL_TABLET | Freq: Four times a day (QID) | ORAL | Status: DC | PRN
  Administered 2012-11-24 – 2012-11-28 (×4): 500 mg via ORAL
  Filled 2012-11-24 (×4): qty 1

## 2012-11-24 MED ORDER — VITAMIN C 500 MG PO TABS
1000.0000 mg | ORAL_TABLET | Freq: Every day | ORAL | Status: DC
  Administered 2012-11-26 – 2012-11-28 (×3): 1000 mg via ORAL
  Filled 2012-11-24 (×5): qty 2

## 2012-11-24 MED ORDER — OXYCODONE-ACETAMINOPHEN 5-325 MG PO TABS
1.0000 | ORAL_TABLET | ORAL | Status: DC | PRN
  Administered 2012-11-24 – 2012-11-26 (×5): 2 via ORAL
  Filled 2012-11-24 (×4): qty 2

## 2012-11-24 MED ORDER — ADULT MULTIVITAMIN W/MINERALS CH
1.0000 | ORAL_TABLET | Freq: Every day | ORAL | Status: DC
  Administered 2012-11-24 – 2012-11-28 (×4): 1 via ORAL
  Filled 2012-11-24 (×7): qty 1

## 2012-11-24 MED ORDER — AMLODIPINE BESYLATE 5 MG PO TABS
5.0000 mg | ORAL_TABLET | Freq: Every evening | ORAL | Status: DC
  Administered 2012-11-25 – 2012-11-27 (×4): 5 mg via ORAL
  Filled 2012-11-24 (×5): qty 1

## 2012-11-24 MED ORDER — HYDROMORPHONE HCL PF 1 MG/ML IJ SOLN: 1.0000 mg | Freq: Once | INTRAMUSCULAR | Status: DC

## 2012-11-24 MED ORDER — SUFENTANIL CITRATE 50 MCG/ML IV SOLN
INTRAVENOUS | Status: DC | PRN
  Administered 2012-11-24: 10 ug via INTRAVENOUS
  Administered 2012-11-24: 20 ug via INTRAVENOUS
  Administered 2012-11-24 (×2): 10 ug via INTRAVENOUS

## 2012-11-24 MED ORDER — VITAMIN C 500 MG PO TABS: 500.0000 mg | ORAL_TABLET | Freq: Every day | ORAL | Status: DC

## 2012-11-24 MED ORDER — ONDANSETRON HCL 4 MG/2ML IJ SOLN
4.0000 mg | Freq: Four times a day (QID) | INTRAMUSCULAR | Status: DC | PRN
  Administered 2012-11-24 – 2012-11-25 (×2): 4 mg via INTRAVENOUS
  Filled 2012-11-24 (×2): qty 2

## 2012-11-24 MED ORDER — WARFARIN SODIUM 10 MG PO TABS
10.0000 mg | ORAL_TABLET | ORAL | Status: AC
  Administered 2012-11-24: 10 mg via ORAL
  Filled 2012-11-24: qty 1

## 2012-11-24 MED ORDER — MIDAZOLAM HCL 2 MG/2ML IJ SOLN: 0.5000 mg | Freq: Once | INTRAMUSCULAR | Status: DC | PRN

## 2012-11-24 MED ORDER — HYDROCODONE-ACETAMINOPHEN 5-325 MG PO TABS
1.0000 | ORAL_TABLET | ORAL | Status: DC | PRN
  Administered 2012-11-25: 2 via ORAL
  Administered 2012-11-26 – 2012-11-27 (×3): 1 via ORAL
  Administered 2012-11-27: 2 via ORAL
  Administered 2012-11-27 (×2): 1 via ORAL
  Administered 2012-11-27: 2 via ORAL
  Administered 2012-11-28 (×2): 1 via ORAL
  Administered 2012-11-28: 2 via ORAL
  Filled 2012-11-24: qty 2
  Filled 2012-11-24 (×2): qty 1
  Filled 2012-11-24: qty 2
  Filled 2012-11-24: qty 1
  Filled 2012-11-24 (×2): qty 2
  Filled 2012-11-24 (×2): qty 1
  Filled 2012-11-24: qty 2

## 2012-11-24 MED ORDER — ENOXAPARIN SODIUM 60 MG/0.6ML ~~LOC~~ SOLN: 60.0000 mg | Freq: Two times a day (BID) | SUBCUTANEOUS | Status: DC

## 2012-11-24 MED ORDER — LACTATED RINGERS IV SOLN
INTRAVENOUS | Status: DC
  Administered 2012-11-24: 16:00:00 via INTRAVENOUS

## 2012-11-24 MED ORDER — HYDROMORPHONE HCL PF 1 MG/ML IJ SOLN
INTRAMUSCULAR | Status: AC
Start: 2012-11-24 — End: 2012-11-24
  Administered 2012-11-24: 1 mg
  Filled 2012-11-24: qty 1

## 2012-11-24 MED ORDER — CEFAZOLIN SODIUM-DEXTROSE 2-3 GM-% IV SOLR
INTRAVENOUS | Status: AC
  Filled 2012-11-24: qty 50

## 2012-11-24 MED ORDER — BUPIVACAINE HCL (PF) 0.25 % IJ SOLN
INTRAMUSCULAR | Status: DC | PRN
  Administered 2012-11-24: 9 mL

## 2012-11-24 MED ORDER — CHLORHEXIDINE GLUCONATE 4 % EX LIQD: 60.0000 mL | Freq: Once | CUTANEOUS | Status: DC

## 2012-11-24 MED ORDER — WARFARIN SODIUM 7.5 MG PO TABS: 7.5000 mg | ORAL_TABLET | ORAL | Status: DC

## 2012-11-24 MED ORDER — PROMETHAZINE HCL 25 MG/ML IJ SOLN: 6.2500 mg | INTRAMUSCULAR | Status: DC | PRN

## 2012-11-24 MED ORDER — METHOCARBAMOL 500 MG PO TABS
ORAL_TABLET | ORAL | Status: AC
  Filled 2012-11-24: qty 1

## 2012-11-24 MED ORDER — VENLAFAXINE HCL ER 150 MG PO CP24
150.0000 mg | ORAL_CAPSULE | Freq: Every morning | ORAL | Status: DC
  Administered 2012-11-26 – 2012-11-28 (×3): 150 mg via ORAL
  Filled 2012-11-24 (×4): qty 1

## 2012-11-24 MED ORDER — DIPHENHYDRAMINE HCL 25 MG PO CAPS
25.0000 mg | ORAL_CAPSULE | Freq: Four times a day (QID) | ORAL | Status: DC | PRN
  Administered 2012-11-25 – 2012-11-27 (×3): 25 mg via ORAL
  Filled 2012-11-24 (×3): qty 1

## 2012-11-24 MED ORDER — WARFARIN SODIUM 10 MG PO TABS: 10.0000 mg | ORAL_TABLET | ORAL | Status: DC

## 2012-11-24 MED ORDER — KCL IN DEXTROSE-NACL 20-5-0.45 MEQ/L-%-% IV SOLN
INTRAVENOUS | Status: DC
  Administered 2012-11-24 – 2012-11-26 (×2): via INTRAVENOUS
  Filled 2012-11-24 (×5): qty 1000

## 2012-11-24 MED ORDER — BUPIVACAINE HCL (PF) 0.25 % IJ SOLN
INTRAMUSCULAR | Status: AC
  Filled 2012-11-24: qty 30

## 2012-11-24 MED ORDER — LABETALOL HCL 300 MG PO TABS
600.0000 mg | ORAL_TABLET | Freq: Two times a day (BID) | ORAL | Status: DC
  Administered 2012-11-24 – 2012-11-27 (×6): 600 mg via ORAL
  Filled 2012-11-24 (×10): qty 2

## 2012-11-24 MED ORDER — CEFAZOLIN SODIUM 1-5 GM-% IV SOLN
1.0000 g | INTRAVENOUS | Status: DC
  Filled 2012-11-24: qty 50

## 2012-11-24 MED ORDER — HYDROMORPHONE HCL PF 1 MG/ML IJ SOLN
INTRAMUSCULAR | Status: AC
  Filled 2012-11-24: qty 1

## 2012-11-24 MED ORDER — CEFAZOLIN SODIUM-DEXTROSE 2-3 GM-% IV SOLR
2.0000 g | INTRAVENOUS | Status: AC
  Administered 2012-11-24: 2 g via INTRAVENOUS

## 2012-11-24 SURGICAL SUPPLY — 58 items
BANDAGE ELASTIC 3 VELCRO ST LF (GAUZE/BANDAGES/DRESSINGS) ×2 IMPLANT
BANDAGE ELASTIC 4 VELCRO ST LF (GAUZE/BANDAGES/DRESSINGS) ×2 IMPLANT
BANDAGE GAUZE ELAST BULKY 4 IN (GAUZE/BANDAGES/DRESSINGS) ×2 IMPLANT
BLADE SURG ROTATE 9660 (MISCELLANEOUS) IMPLANT
BNDG CMPR 9X4 STRL LF SNTH (GAUZE/BANDAGES/DRESSINGS) ×1
BNDG ESMARK 4X9 LF (GAUZE/BANDAGES/DRESSINGS) ×2 IMPLANT
CLOTH BEACON ORANGE TIMEOUT ST (SAFETY) ×2 IMPLANT
CORDS BIPOLAR (ELECTRODE) ×2 IMPLANT
COVER SURGICAL LIGHT HANDLE (MISCELLANEOUS) ×2 IMPLANT
CUFF TOURNIQUET SINGLE 18IN (TOURNIQUET CUFF) ×2 IMPLANT
CUFF TOURNIQUET SINGLE 24IN (TOURNIQUET CUFF) IMPLANT
DRAIN TLS ROUND 10FR (DRAIN) IMPLANT
DRAPE INCISE IOBAN 66X45 STRL (DRAPES) ×1 IMPLANT
DRAPE OEC MINIVIEW 54X84 (DRAPES) ×2 IMPLANT
DRAPE PROXIMA HALF (DRAPES) ×1 IMPLANT
DRAPE SURG 17X11 SM STRL (DRAPES) ×2 IMPLANT
DRSG ADAPTIC 3X8 NADH LF (GAUZE/BANDAGES/DRESSINGS) ×2 IMPLANT
ELECT REM PT RETURN 9FT ADLT (ELECTROSURGICAL)
ELECTRODE REM PT RTRN 9FT ADLT (ELECTROSURGICAL) IMPLANT
GLOVE BIOGEL PI IND STRL 8.5 (GLOVE) ×1 IMPLANT
GLOVE BIOGEL PI INDICATOR 8.5 (GLOVE) ×1
GLOVE SURG ORTHO 8.0 STRL STRW (GLOVE) ×2 IMPLANT
GOWN PREVENTION PLUS XLARGE (GOWN DISPOSABLE) ×2 IMPLANT
GOWN STRL NON-REIN LRG LVL3 (GOWN DISPOSABLE) ×6 IMPLANT
KIT BASIN OR (CUSTOM PROCEDURE TRAY) ×2 IMPLANT
KIT ROOM TURNOVER OR (KITS) ×2 IMPLANT
MANIFOLD NEPTUNE II (INSTRUMENTS) ×2 IMPLANT
NDL HYPO 25X1 1.5 SAFETY (NEEDLE) ×1 IMPLANT
NEEDLE HYPO 25X1 1.5 SAFETY (NEEDLE) ×2 IMPLANT
NS IRRIG 1000ML POUR BTL (IV SOLUTION) ×2 IMPLANT
PACK ORTHO EXTREMITY (CUSTOM PROCEDURE TRAY) ×2 IMPLANT
PAD ARMBOARD 7.5X6 YLW CONV (MISCELLANEOUS) ×4 IMPLANT
PAD CAST 3X4 CTTN HI CHSV (CAST SUPPLIES) IMPLANT
PAD CAST 4YDX4 CTTN HI CHSV (CAST SUPPLIES) ×1 IMPLANT
PADDING CAST COTTON 3X4 STRL (CAST SUPPLIES) ×2
PADDING CAST COTTON 4X4 STRL (CAST SUPPLIES) ×2
PEG LOCKING SMOOTH 2.2X16 (Screw) ×2 IMPLANT
PEG LOCKING SMOOTH 2.2X18 (Peg) ×2 IMPLANT
PEG LOCKING SMOOTH 2.2X20 (Screw) ×3 IMPLANT
PLATE NARROW DVR LEFT (Plate) ×1 IMPLANT
SCREW  LP NL 2.7X13MM (Screw) ×1 IMPLANT
SCREW 2.7X12MM (Screw) ×2 IMPLANT
SCREW LOCK 12X2.7X 3 LD (Screw) IMPLANT
SCREW LOCKING 2.7X12MM (Screw) ×2 IMPLANT
SCREW LP NL 2.7X13MM (Screw) IMPLANT
SCREW MULTI DIRECTIONAL 2.7X12 (Screw) ×1 IMPLANT
SCREW MULTI DIRECTIONAL 2.7X18 (Screw) ×1 IMPLANT
SOAP 2 % CHG 4 OZ (WOUND CARE) ×2 IMPLANT
SPONGE GAUZE 4X4 12PLY (GAUZE/BANDAGES/DRESSINGS) ×2 IMPLANT
SUT PROLENE 4 0 PS 2 18 (SUTURE) ×2 IMPLANT
SUT VIC AB 2-0 FS1 27 (SUTURE) ×2 IMPLANT
SUT VICRYL 4-0 PS2 18IN ABS (SUTURE) ×2 IMPLANT
SYR CONTROL 10ML LL (SYRINGE) ×1 IMPLANT
SYSTEM CHEST DRAIN TLS 7FR (DRAIN) IMPLANT
TOWEL OR 17X24 6PK STRL BLUE (TOWEL DISPOSABLE) ×2 IMPLANT
TOWEL OR 17X26 10 PK STRL BLUE (TOWEL DISPOSABLE) ×2 IMPLANT
TUBE CONNECTING 12X1/4 (SUCTIONS) ×2 IMPLANT
WATER STERILE IRR 1000ML POUR (IV SOLUTION) ×2 IMPLANT

## 2012-11-24 NOTE — Anesthesia Preprocedure Evaluation (Addendum)
Anesthesia Evaluation  Patient identified by MRN, date of birth, ID band Patient awake    Reviewed: Allergy & Precautions, H&P , NPO status , Patient's Chart, lab work & pertinent test results, reviewed documented beta blocker date and time   History of Anesthesia Complications Negative for: history of anesthetic complications  Airway Mallampati: II TM Distance: >3 FB Neck ROM: Full    Dental  (+) Dental Advisory Given   Pulmonary neg pulmonary ROS,  breath sounds clear to auscultation  Pulmonary exam normal       Cardiovascular hypertension, Pt. on medications and Pt. on home beta blockers + Valvular Problems/Murmurs (s/p MVR: lovenox bridge for coumadin, INR 1.10) Rhythm:Regular Rate:Normal + Systolic Click ( prosthetic mitral valve)- Systolic murmurs    Neuro/Psych Depression negative neurological ROS     GI/Hepatic negative GI ROS, Neg liver ROS,   Endo/Other  negative endocrine ROS  Renal/GU negative Renal ROS     Musculoskeletal   Abdominal   Peds  Hematology negative hematology ROS (+)   Anesthesia Other Findings   Reproductive/Obstetrics                          Anesthesia Physical Anesthesia Plan  ASA: III  Anesthesia Plan: General   Post-op Pain Management:    Induction: Intravenous  Airway Management Planned: LMA  Additional Equipment:   Intra-op Plan:   Post-operative Plan: Extubation in OR  Informed Consent: I have reviewed the patients History and Physical, chart, labs and discussed the procedure including the risks, benefits and alternatives for the proposed anesthesia with the patient or authorized representative who has indicated his/her understanding and acceptance.   Dental advisory given  Plan Discussed with: CRNA, Anesthesiologist and Surgeon  Anesthesia Plan Comments: (Plan routine monitors, GA- LMA OK Patient decline ax block, prefers local by surgeon)        Anesthesia Quick Evaluation

## 2012-11-24 NOTE — Brief Op Note (Signed)
11/24/2012  4:53 PM  PATIENT:  Jodi Frank  72 y.o. female  PRE-OPERATIVE DIAGNOSIS:  LEFT WRIST DISTAL RADIUS FRACTURE   POST-OPERATIVE DIAGNOSIS:  SAME  PROCEDURE:  Procedure(s): OPEN REDUCTION INTERNAL FIXATION LEFT WRIST POSSIBLE BONE GRAFTING AND PINNING (ORIF)  (Left)  SURGEON:  Surgeon(s) and Role:    * [name], MD - Primary  PHYSICIAN ASSISTANT: NONE  ASSISTANTS: none   ANESTHESIA:   general  EBL:   minimal   BLOOD ADMINISTERED:none  DRAINS: none   LOCAL MEDICATIONS USED:  MARCAINE     SPECIMEN:  No Specimen  DISPOSITION OF SPECIMEN:  N/A  COUNTS:  YES  TOURNIQUET:  60 min  DICTATION: .typed  PLAN OF CARE: Admit for overnight observation  PATIENT DISPOSITION:  PACU - hemodynamically stable.   Delay start of Pharmacological VTE agent (>24hrs) due to surgical blood loss or risk of bleeding: not applicable

## 2012-11-24 NOTE — Progress Notes (Signed)
ANTICOAGULATION CONSULT NOTE - Initial Consult  Pharmacy Consult for Coumadin/Lovenox Indication: Mitral Valve surgery  No Known Allergies  Patient Measurements: Height: 5\' 4"  (162.6 cm) Weight: 123 lb 7.3 oz (56 kg) IBW/kg (Calculated) : 54.7 Heparin Dosing Weight:    Vital Signs: Temp: 97.3 F (36.3 C) (07/02 1415) Temp src: Oral (07/02 1415) BP: 145/66 mmHg (07/02 1415) Pulse Rate: 63 (07/02 1550)  Labs:  Recent Labs  11/24/12 1418  HGB 11.9*  HCT 34.2*  PLT 342  APTT 37  LABPROT 14.0  INR 1.10  CREATININE 0.59    Estimated Creatinine Clearance: 56.5 ml/min (by C-G formula based on Cr of 0.59).   Medical History: Past Medical History  Diagnosis Date  . Depression   . Diarrhea   . 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 Hypertension   . Broken wrist     Medications:  Prescriptions prior to admission  Medication Sig Dispense Refill  . amLODipine (NORVASC) 5 MG tablet Take 5 mg by mouth every evening.      . Carboxymethylcell-Hypromellose (GENTEAL) 0.25-0.3 % GEL Apply 1 application to eye every evening.      . enoxaparin (LOVENOX) 60 MG/0.6ML injection Inject 60 mg into the skin every 12 (twelve) hours.      . fish oil-omega-3 fatty acids 1000 MG capsule Take 2 g by mouth 2 (two) times daily.       Marland Kitchen labetalol (NORMODYNE) 300 MG tablet Take 600 mg by mouth 2 (two) times daily.      . methocarbamol (ROBAXIN) 500 MG tablet Take 1 tablet (500 mg total) by mouth every 6 (six) hours as needed.  40 tablet  1  . Multiple Vitamin (MULTIVITAMIN WITH MINERALS) TABS Take 1 tablet by mouth daily.      Marland Kitchen oxyCODONE-acetaminophen (PERCOCET/ROXICET) 5-325 MG per tablet Take one tablet by mouth every 4 to 8 hours as needed for pain.  90 tablet  0  . venlafaxine XR (EFFEXOR-XR) 150 MG 24 hr capsule Take 150 mg by mouth every morning.       . warfarin (COUMADIN) 10 MG tablet Take 10 mg by mouth See admin instructions. Only on Tuesday evenings      . warfarin  (COUMADIN) 7.5 MG tablet Take 7.5 mg by mouth as directed. Take every night except Tuesday        Assessment: s/p fall with L wrist distal radius rx. Surgery 7/2 to restore alignment to wrist. Patient with recent THR for hip fx 11/03/12.  Patient on Coumadin PTA for mechanical MVR at 7.5mg  daily except Tuesdays. INR today only 1.1. Spoke with Dr. Orlan Leavens who ok'd bridging with LMWH.  Goal of Therapy:  INR 2.5-3.5 Monitor platelets by anticoagulation protocol: Yes   Plan:  Lovenox 55mg  sq q12 hrs. OK to start now per Dr. Orlan Leavens. Coumadin 10mg  po x 1 now tonight. Daily PT/INR   Aunesti Pellegrino S. Merilynn Finland, PharmD, BCPS Clinical Staff Pharmacist Pager [phone]  Misty Stanley Stillinger 11/24/2012,5:49 PM

## 2012-11-24 NOTE — H&P (Signed)
Jodi Frank is an 72 y.o. female.   Chief Complaint: left wrist distal radius fracture HPI: Pt fell about 3 weeks ago, sustained closed distal radius fracture, treated nonop but displacment occurred and now here for surgery to restore alignment to wrist Pt followed in office  Past Medical History  Diagnosis Date  . Depression   . Diarrhea   . 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 Hypertension   . Broken wrist     Past Surgical History  Procedure Laterality Date  . Mitral valve replacement      Pt. is on coumadin.  . Shoulder open rotator cuff repair    . Hip pinning,cannulated Left 11/03/2012    Procedure: CANNULATED HIP PINNING;  Surgeon: Jacki Cones, MD;  Location: [facility];  Service: Orthopedics;  Laterality: Left;    Family History  Problem Relation Age of Onset  . Stroke Mother   . Colon cancer Paternal Grandmother    Social History:  reports that she has never smoked. She has never used smokeless tobacco. She reports that she drinks about 4.2 ounces of alcohol per week. She reports that she does not use illicit drugs.  Allergies: No Known Allergies  Medications Prior to Admission  Medication Sig Dispense Refill  . amLODipine (NORVASC) 5 MG tablet Take 5 mg by mouth every evening.      . Carboxymethylcell-Hypromellose (GENTEAL) 0.25-0.3 % GEL Apply 1 application to eye every evening.      . enoxaparin (LOVENOX) 60 MG/0.6ML injection Inject 60 mg into the skin every 12 (twelve) hours.      . fish oil-omega-3 fatty acids 1000 MG capsule Take 2 g by mouth 2 (two) times daily.       Marland Kitchen labetalol (NORMODYNE) 300 MG tablet Take 600 mg by mouth 2 (two) times daily.      . methocarbamol (ROBAXIN) 500 MG tablet Take 1 tablet (500 mg total) by mouth every 6 (six) hours as needed.  40 tablet  1  . Multiple Vitamin (MULTIVITAMIN WITH MINERALS) TABS Take 1 tablet by mouth daily.      Marland Kitchen oxyCODONE-acetaminophen (PERCOCET/ROXICET) 5-325 MG per tablet Take one tablet by mouth  every 4 to 8 hours as needed for pain.  90 tablet  0  . venlafaxine XR (EFFEXOR-XR) 150 MG 24 hr capsule Take 150 mg by mouth every morning.       . warfarin (COUMADIN) 10 MG tablet Take 10 mg by mouth See admin instructions. Only on Tuesday evenings      . warfarin (COUMADIN) 7.5 MG tablet Take 7.5 mg by mouth as directed. Take every night except Tuesday        Results for orders placed during the hospital encounter of 11/24/12 (from the past 48 hour(s))  BASIC METABOLIC PANEL     Status: Abnormal   Collection Time    11/24/12  2:18 PM      Result Value Range   Sodium 132 (*) 135 - 145 mEq/L   Potassium 4.2  3.5 - 5.1 mEq/L   Chloride 96  96 - 112 mEq/L   CO2 25  19 - 32 mEq/L   Glucose, Bld 108 (*) 70 - 99 mg/dL   BUN 12  6 - 23 mg/dL   Creatinine, Ser 1.61  0.50 - 1.10 mg/dL   Calcium 9.8  8.4 - 09.6 mg/dL   GFR calc non Af Amer >90  >90 mL/min   GFR calc Af Amer >90  >  90 mL/min   Comment:            The eGFR has been calculated     using the CKD EPI equation.     This calculation has not been     validated in all clinical     situations.     eGFR's persistently     <90 mL/min signify     possible Chronic Kidney Disease.  APTT     Status: None   Collection Time    11/24/12  2:18 PM      Result Value Range   aPTT 37  24 - 37 seconds   Comment:            IF BASELINE aPTT IS ELEVATED,     SUGGEST PATIENT RISK ASSESSMENT     BE USED TO DETERMINE APPROPRIATE     ANTICOAGULANT THERAPY.  CBC     Status: Abnormal   Collection Time    11/24/12  2:18 PM      Result Value Range   WBC 4.3  4.0 - 10.5 K/uL   RBC 4.17  3.87 - 5.11 MIL/uL   Hemoglobin 11.9 (*) 12.0 - 15.0 g/dL   HCT 45.4 (*) 09.8 - 11.9 %   MCV 82.0  78.0 - 100.0 fL   MCH 28.5  26.0 - 34.0 pg   MCHC 34.8  30.0 - 36.0 g/dL   RDW 14.7  82.9 - 56.2 %   Platelets 342  150 - 400 K/uL  PROTIME-INR     Status: None   Collection Time    11/24/12  2:18 PM      Result Value Range   Prothrombin Time 14.0  11.6 -  15.2 seconds   INR 1.10  0.00 - 1.49   Dg Chest 2 View  11/24/2012   *RADIOLOGY REPORT*  Clinical Data: ORIF of left wrist  CHEST - 2 VIEW  Comparison: 11/02/2012  Findings: Normal heart size.  No pleural effusion or pulmonary edema.  The lungs are hyperinflated.  There are coarsened interstitial markings noted bilaterally.  No airspace consolidation identified.  Mild spondylosis noted within the thoracic spine.  IMPRESSION:  1.  No active disease.   Original Report Authenticated By: Signa Kell, M.D.    NO RECENT ILLNESESSS, PREVIOUS HOSPITALIZATION REVIEWED  Blood pressure 145/66, pulse 63, temperature 97.3 F (36.3 C), temperature source Oral, resp. rate 14, height 5\' 4"  (1.626 m), weight 56 kg (123 lb 7.3 oz), SpO2 99.00%. General Appearance:  Alert, cooperative, no distress, appears stated age  Head:  Normocephalic, without obvious abnormality, atraumatic  Eyes:  Pupils equal, conjunctiva/corneas clear,         Throat: Lips, mucosa, and tongue normal; teeth and gums normal  Neck: No visible masses     Lungs:   respirations unlabored  Chest Wall:  No tenderness or deformity  Heart:  Regular rate and rhythm,  Abdomen:   Soft, non-tender,         Extremities: LEFT WRIST: OBVIOUS DEFORMITY TO WRIST FINGERS WARM WELL PERFUSED GOOD DIGITAL MOTION  Pulses: 2+ and symmetric  Skin: Skin color, texture, turgor normal, no rashes or lesions     Neurologic: Normal   Assessment/Plan LEFT WRIST DISTAL RADIUS FRACTURE, DISPLACED  LEFT WRIST OPEN REDUCTION AND INTERNAL FIXATION  R/B/A DISCUSSED WITH PT IN OFFICE.  PT VOICED UNDERSTANDING OF PLAN CONSENT SIGNED DAY OF SURGERY PT SEEN AND EXAMINED PRIOR TO OPERATIVE PROCEDURE/DAY OF SURGERY SITE MARKED. QUESTIONS ANSWERED WILL REMAIN  AN INPATIENT OBSERVATION FOLLOWING SURGERY  Jodi Frank 11/24/2012, 4:47 PM

## 2012-11-24 NOTE — Anesthesia Postprocedure Evaluation (Signed)
Anesthesia Post Note  Patient: Jodi Frank  Procedure(s) Performed: Procedure(s) (LRB): OPEN REDUCTION INTERNAL FIXATION LEFT WRIST POSSIBLE BONE GRAFTING AND PINNING (ORIF)  (Left)  Anesthesia type: General  Patient location: PACU  Post pain: Pain level controlled  Post assessment: Patient's Cardiovascular Status Stable  Last Vitals:  Filed Vitals:   11/24/12 1925  BP: 152/56  Pulse: 70  Temp:   Resp: 18    Post vital signs: Reviewed and stable  Level of consciousness: alert  Complications: No apparent anesthesia complications

## 2012-11-24 NOTE — Op Note (Signed)
PREOPERATIVE DIAGNOSIS: Left wrist intra-articular distal radius  fracture, 3 or more fragments.   POSTOPERATIVE DIAGNOSIS: Left wrist intra-articular distal radius  fracture, 3 or more fragments.   ATTENDING PHYSICIAN: [name] IV, MD who scrubbed and present  entire procedure.   ASSISTANT SURGEON: None.  ANESTHESIA: General  SURGICAL IMPLANTS: [employer] DVR plate, narrow with six distal locking pegs and 3 bicortical screws proximally and 2 locking screws  SURGICAL PROCEDURE:  1. Open treatment of left wrist intra-articular distal radius fracture, 3 or more fragments.  2. Left wrist brachioradialis tenotomy and release.  3. Radiographs, stress radiographs, left wrist.   SURGICAL INDICATIONS: Jodi Jodi Frank  is a right-hand-dominant female who sustained an intra-articular distal radius fracture after a fall. The  patient was seen and evaluated in the ED based on degree of  displacement and the  displacement, recommended that she undergo  the above procedure. Risks, benefits, and alternatives were discussed  in detail with the patient. Signed informed consent was obtained.  Risks include, but not limited to bleeding, infection, damage to nearby  nerves, arteries, or tendons, nonunion, malunion, hardware failure, loss  of motion of the elbow, wrist, and digits, and need for further surgical  intervention.   PROCEDURE: The patient was properly identified in the preop holding  area. A mark with a permanent marker was made on the left wrist to  indicate correct operative site. The patient tolerated the block  performed by Anesthesia. The patient was then brought back to the  operating room. The patient received preoperative antibiotics. General  anesthesia was induced. Left upper extremity was prepped and draped in  normal sterile fashion. Time-out was called. Correct site was  identified, and procedure then begun. Attention was then turned to the  left wrist. The limb was  then elevated using Esmarch exsanguination and  tourniquet insufflated. A longitudinal incision was made directly over  the FCR sheath. Dissection was then carried down through the skin and  subcutaneous tissue. The FCR sheath was then opened proximally and  distally. Careful dissection was Jodi Frank going through the floor of the  FCR sheath where the FPL was identified. An L-shaped pronator quadratus  flap was then elevated. In order to aid in reduction tenotomy of the brachioradialis was completed with protection of the first dorsal compartment tendons.  The fracture site was then opened and the patient did have several fracture fragment extendin into the joint greater than 3 part intra-articular fracture. Careful open reduction was then carried out. The appropriate size dvr plate was chosen.. The oblong screw hole was then drilled with a 2.2 mm drill bit, then 2.7 mm bicortical screw. Plate height was adjusted.   After position was then confirmed using mini C-arm, the distal row  fixation was then carried out with the beginning from an ulnar to radial  direction with the  locking pegs. The total of 6 locking pegs were then placed. Following this, attention was then turned proximally where 2 more nonlocking screws were then placed and 2 locking screws.  The wound was then thoroughly irrigated. Final stress radiography was then carried out.  Stress radiographs were then obtained under live fluoro showing no widening of the SL interval. I did not see any carpal dissociation with good fixation, without any evidence of penetration in the articular margin with the locking pegs.   Postop, the pronator quadratus was then closed with 2-0 Vicryl.  Tourniquet was then deflated. Hemostasis was then obtained. The  subcutaneous tissues  closed with 4-0 Vicryl and skin closed with simple  nylon sutures. Adaptic dressing and sterile compressive bandage was  then applied. The patient was then placed in a  well-padded volar splint. Extubated and taken to recovery room in good condition.    INTRAOPERATIVE RADIOGRAPHS:, 3 views of the wrist do show  the volar plate fixation in place. There is good position in both  planes.    POSTOPERATIVE PLAN: The patient will be admitted for IV antibiotics and  pain control; discharged in the morning. Seen back in the office for  approximately 10-14 days for wound check, suture removal, and then x-  rays, short-arm cast for total 4 weeks, and then begin a therapy regimen  around a 4-week mark. Radiographs at each visit.    Jodi Done, MD  r

## 2012-11-24 NOTE — Transfer of Care (Signed)
Immediate Anesthesia Transfer of Care Note  Patient: Jodi Frank  Procedure(s) Performed: Procedure(s): OPEN REDUCTION INTERNAL FIXATION LEFT WRIST POSSIBLE BONE GRAFTING AND PINNING (ORIF)  (Left)  Patient Location: PACU  Anesthesia Type:General  Level of Consciousness: awake  Airway & Oxygen Therapy: Patient Spontanous Breathing and Patient connected to nasal cannula oxygen  Post-op Assessment: Report given to PACU RN, Post -op Vital signs reviewed and stable and Patient moving all extremities  Post vital signs: Reviewed and stable  Complications: No apparent anesthesia complications

## 2012-11-24 NOTE — Preoperative (Signed)
Beta Blockers   Reason not to administer Beta Blockers:Labetalol taken by patient at 0730 hrs on 11/24/12

## 2012-11-24 NOTE — Progress Notes (Signed)
Orthopedic Tech Progress Note Patient Details:  Jodi Frank 1941-03-05 [id number]  Ortho Devices Ortho Device/Splint Location: kuzman sling LUE Ortho Device/Splint Interventions: Ordered;Application   [name] 11/24/2012, 9:45 PM

## 2012-11-25 ENCOUNTER — Encounter (HOSPITAL_COMMUNITY)
Admission: RE | Disposition: A | Discharge status: Home Health | Payer: Self-pay | Source: Ambulatory Visit | Attending: Orthopedic Surgery

## 2012-11-25 ENCOUNTER — Encounter (HOSPITAL_COMMUNITY): Payer: Self-pay | Admitting: *Deleted

## 2012-11-25 ENCOUNTER — Encounter (HOSPITAL_COMMUNITY): Payer: Self-pay | Admitting: Anesthesiology

## 2012-11-25 ENCOUNTER — Ambulatory Visit (HOSPITAL_COMMUNITY): Payer: PRIVATE HEALTH INSURANCE | Admitting: Anesthesiology

## 2012-11-25 HISTORY — PX: HEMATOMA EVACUATION: SHX5118

## 2012-11-25 LAB — CBC
HCT: 33.6 % — ABNORMAL LOW (ref 36.0–46.0)
Hemoglobin: 11.1 g/dL — ABNORMAL LOW (ref 12.0–15.0)
MCHC: 34.5 g/dL (ref 30.0–36.0)
RBC: 3.98 MIL/uL (ref 3.87–5.11)
RDW: 13.2 % (ref 11.5–15.5)

## 2012-11-25 LAB — TYPE AND SCREEN
ABO/RH(D): O POS
Antibody Screen: NEGATIVE

## 2012-11-25 LAB — PROTIME-INR
INR: 1.1 (ref 0.00–1.49)
Prothrombin Time: 14 seconds (ref 11.6–15.2)

## 2012-11-25 SURGERY — EVACUATION HEMATOMA
Anesthesia: General | Site: Wrist | Laterality: Left | Wound class: Clean

## 2012-11-25 MED ORDER — WARFARIN SODIUM 10 MG PO TABS
10.0000 mg | ORAL_TABLET | Freq: Once | ORAL | Status: DC
  Filled 2012-11-25: qty 1

## 2012-11-25 MED ORDER — ONDANSETRON HCL 4 MG/2ML IJ SOLN
INTRAMUSCULAR | Status: DC | PRN
  Administered 2012-11-25: 4 mg via INTRAVENOUS

## 2012-11-25 MED ORDER — PROPOFOL 10 MG/ML IV BOLUS
INTRAVENOUS | Status: DC | PRN
  Administered 2012-11-25: 100 mg via INTRAVENOUS

## 2012-11-25 MED ORDER — LACTATED RINGERS IV SOLN
INTRAVENOUS | Status: DC | PRN
  Administered 2012-11-25: 14:00:00 via INTRAVENOUS

## 2012-11-25 MED ORDER — VITAMIN K1 10 MG/ML IJ SOLN
2.0000 mg | Freq: Once | INTRAVENOUS | Status: DC
  Filled 2012-11-25: qty 0.2

## 2012-11-25 MED ORDER — THROMBIN 20000 UNITS EX KIT
PACK | CUTANEOUS | Status: DC | PRN
  Administered 2012-11-25: 20000 [IU] via TOPICAL

## 2012-11-25 MED ORDER — HEMOSTATIC AGENTS (NO CHARGE) OPTIME
TOPICAL | Status: DC | PRN
  Administered 2012-11-25: 1 via TOPICAL

## 2012-11-25 MED ORDER — CEFAZOLIN SODIUM 1-5 GM-% IV SOLN: 1.0000 g | Freq: Three times a day (TID) | INTRAVENOUS | Status: DC

## 2012-11-25 MED ORDER — FENTANYL CITRATE 0.05 MG/ML IJ SOLN: 25.0000 ug | INTRAMUSCULAR | Status: DC | PRN

## 2012-11-25 MED ORDER — SODIUM CHLORIDE 0.9 % IR SOLN
Status: DC | PRN
  Administered 2012-11-25: 1000 mL

## 2012-11-25 MED ORDER — FENTANYL CITRATE 0.05 MG/ML IJ SOLN
INTRAMUSCULAR | Status: AC
  Administered 2012-11-25: 50 ug via INTRAVENOUS
  Filled 2012-11-25: qty 2

## 2012-11-25 MED ORDER — FENTANYL CITRATE 0.05 MG/ML IJ SOLN
INTRAMUSCULAR | Status: DC | PRN
  Administered 2012-11-25: 50 ug via INTRAVENOUS

## 2012-11-25 MED ORDER — MIDAZOLAM HCL 5 MG/5ML IJ SOLN
INTRAMUSCULAR | Status: DC | PRN
  Administered 2012-11-25: 2 mg via INTRAVENOUS

## 2012-11-25 MED ORDER — ZOLPIDEM TARTRATE 5 MG PO TABS: 5.0000 mg | ORAL_TABLET | Freq: Every evening | ORAL | Status: DC | PRN

## 2012-11-25 SURGICAL SUPPLY — 61 items
BANDAGE CONFORM 2  STR LF (GAUZE/BANDAGES/DRESSINGS) IMPLANT
BANDAGE ELASTIC 3 VELCRO ST LF (GAUZE/BANDAGES/DRESSINGS) ×2 IMPLANT
BANDAGE ELASTIC 4 VELCRO ST LF (GAUZE/BANDAGES/DRESSINGS) ×2 IMPLANT
BANDAGE GAUZE ELAST BULKY 4 IN (GAUZE/BANDAGES/DRESSINGS) ×2 IMPLANT
BNDG CMPR 9X4 STRL LF SNTH (GAUZE/BANDAGES/DRESSINGS) ×1
BNDG COHESIVE 1X5 TAN STRL LF (GAUZE/BANDAGES/DRESSINGS) IMPLANT
BNDG ESMARK 4X9 LF (GAUZE/BANDAGES/DRESSINGS) ×2 IMPLANT
CLOTH BEACON ORANGE TIMEOUT ST (SAFETY) ×2 IMPLANT
CORDS BIPOLAR (ELECTRODE) ×2 IMPLANT
COVER SURGICAL LIGHT HANDLE (MISCELLANEOUS) ×2 IMPLANT
CUFF TOURNIQUET SINGLE 18IN (TOURNIQUET CUFF) ×2 IMPLANT
CUFF TOURNIQUET SINGLE 24IN (TOURNIQUET CUFF) IMPLANT
DRAIN PENROSE 1/4X12 LTX STRL (WOUND CARE) IMPLANT
DRAPE OEC MINIVIEW 54X84 (DRAPES) ×1 IMPLANT
DRAPE SURG 17X23 STRL (DRAPES) ×2 IMPLANT
DRSG ADAPTIC 3X8 NADH LF (GAUZE/BANDAGES/DRESSINGS) ×1 IMPLANT
ELECT REM PT RETURN 9FT ADLT (ELECTROSURGICAL)
ELECTRODE REM PT RTRN 9FT ADLT (ELECTROSURGICAL) IMPLANT
GAUZE XEROFORM 1X8 LF (GAUZE/BANDAGES/DRESSINGS) ×2 IMPLANT
GAUZE XEROFORM 5X9 LF (GAUZE/BANDAGES/DRESSINGS) ×1 IMPLANT
GLOVE BIO SURGEON STRL SZ 6 (GLOVE) ×1 IMPLANT
GLOVE BIOGEL PI IND STRL 6.5 (GLOVE) IMPLANT
GLOVE BIOGEL PI IND STRL 8.5 (GLOVE) ×1 IMPLANT
GLOVE BIOGEL PI INDICATOR 6.5 (GLOVE) ×1
GLOVE BIOGEL PI INDICATOR 8.5 (GLOVE) ×1
GLOVE SS N UNI LF 7.5 STRL (GLOVE) ×2 IMPLANT
GLOVE SURG ORTHO 8.0 STRL STRW (GLOVE) ×3 IMPLANT
GOWN PREVENTION PLUS XLARGE (GOWN DISPOSABLE) ×2 IMPLANT
GOWN STRL NON-REIN LRG LVL3 (GOWN DISPOSABLE) ×6 IMPLANT
HANDPIECE INTERPULSE COAX TIP (DISPOSABLE)
HEMOSTAT SNOW SURGICEL 2X4 (HEMOSTASIS) ×1 IMPLANT
KIT BASIN OR (CUSTOM PROCEDURE TRAY) ×2 IMPLANT
KIT ROOM TURNOVER OR (KITS) ×2 IMPLANT
MANIFOLD NEPTUNE II (INSTRUMENTS) ×2 IMPLANT
NDL HYPO 25GX1X1/2 BEV (NEEDLE) IMPLANT
NEEDLE HYPO 25GX1X1/2 BEV (NEEDLE) IMPLANT
NS IRRIG 1000ML POUR BTL (IV SOLUTION) ×2 IMPLANT
PACK ORTHO EXTREMITY (CUSTOM PROCEDURE TRAY) ×2 IMPLANT
PAD ARMBOARD 7.5X6 YLW CONV (MISCELLANEOUS) ×4 IMPLANT
PAD CAST 4YDX4 CTTN HI CHSV (CAST SUPPLIES) ×1 IMPLANT
PADDING CAST COTTON 4X4 STRL (CAST SUPPLIES) ×2
SET HNDPC FAN SPRY TIP SCT (DISPOSABLE) IMPLANT
SOAP 2 % CHG 4 OZ (WOUND CARE) ×2 IMPLANT
SPLINT FIBERGLASS 3X35 (CAST SUPPLIES) ×1 IMPLANT
SPONGE GAUZE 4X4 12PLY (GAUZE/BANDAGES/DRESSINGS) ×2 IMPLANT
SPONGE LAP 18X18 X RAY DECT (DISPOSABLE) ×2 IMPLANT
SPONGE LAP 4X18 X RAY DECT (DISPOSABLE) ×2 IMPLANT
SUCTION FRAZIER TIP 10 FR DISP (SUCTIONS) ×4 IMPLANT
SUT ETHILON 4 0 PS 2 18 (SUTURE) IMPLANT
SUT ETHILON 5 0 P 3 18 (SUTURE) ×1
SUT NYLON ETHILON 5-0 P-3 1X18 (SUTURE) ×1 IMPLANT
SUT PROLENE 3 0 PS 2 (SUTURE) ×2 IMPLANT
SYR CONTROL 10ML LL (SYRINGE) IMPLANT
SYSTEM CHEST DRAIN TLS 7FR (DRAIN) ×1 IMPLANT
TOWEL OR 17X24 6PK STRL BLUE (TOWEL DISPOSABLE) ×2 IMPLANT
TOWEL OR 17X26 10 PK STRL BLUE (TOWEL DISPOSABLE) ×2 IMPLANT
TUBE ANAEROBIC SPECIMEN COL (MISCELLANEOUS) IMPLANT
TUBE CONNECTING 12X1/4 (SUCTIONS) ×2 IMPLANT
UNDERPAD 30X30 INCONTINENT (UNDERPADS AND DIAPERS) ×2 IMPLANT
WATER STERILE IRR 1000ML POUR (IV SOLUTION) ×2 IMPLANT
YANKAUER SUCT BULB TIP NO VENT (SUCTIONS) ×2 IMPLANT

## 2012-11-25 NOTE — Anesthesia Postprocedure Evaluation (Signed)
  Anesthesia Post-op Note  Patient: Jodi Frank  Procedure(s) Performed: Procedure(s): EVACUATION HEMATOMA (Left)  Patient Location: PACU  Anesthesia Type:General  Level of Consciousness: awake, alert  and oriented  Airway and Oxygen Therapy: Patient Spontanous Breathing and Patient connected to nasal cannula oxygen  Post-op Pain: mild  Post-op Assessment: Post-op Vital signs reviewed, Patient's Cardiovascular Status Stable, Respiratory Function Stable, Patent Airway and No signs of Nausea or vomiting  Post-op Vital Signs: Reviewed and stable  Complications: No apparent anesthesia complications

## 2012-11-25 NOTE — Preoperative (Signed)
Beta Blockers   Reason not to administer Beta Blockers:Not Applicable 

## 2012-11-25 NOTE — Progress Notes (Signed)
Patient complaining increased pain left wrist throbbing ,unable to move fingers ,and numbness.Patient states, "It feels like my arm is swelling." Patient does have edema noted left hand fingers.Patient wants MD to come remove splint cast.Call placed to [facility] PA.

## 2012-11-25 NOTE — Progress Notes (Signed)
Full note dictated Plan for return to or today for post operative hematoma and pain Discussed with patient and husband Consent signed Emergency return for pain and swelling [phone]

## 2012-11-25 NOTE — Anesthesia Preprocedure Evaluation (Addendum)
Anesthesia Evaluation  Patient identified by MRN, date of birth, ID band Patient awake    Reviewed: Allergy & Precautions, H&P , NPO status , Patient's Chart, lab work & pertinent test results  History of Anesthesia Complications Negative for: history of anesthetic complications  Airway Mallampati: II TM Distance: >3 FB Neck ROM: Full    Dental no notable dental hx. (+) Teeth Intact and Dental Advisory Given   Pulmonary neg pulmonary ROS,  breath sounds clear to auscultation  Pulmonary exam normal       Cardiovascular hypertension, On Medications + Valvular Problems/Murmurs Rhythm:Regular Rate:Normal  S/p Mitral valve replacement   Neuro/Psych PSYCHIATRIC DISORDERS Depression negative neurological ROS     GI/Hepatic negative GI ROS, Neg liver ROS,   Endo/Other  negative endocrine ROS  Renal/GU negative Renal ROS  negative genitourinary   Musculoskeletal   Abdominal   Peds  Hematology negative hematology ROS (+) anemia ,   Anesthesia Other Findings   Reproductive/Obstetrics negative OB ROS                        Anesthesia Physical Anesthesia Plan  ASA: III  Anesthesia Plan: General   Post-op Pain Management:    Induction: Intravenous  Airway Management Planned: LMA  Additional Equipment:   Intra-op Plan:   Post-operative Plan: Extubation in OR  Informed Consent: I have reviewed the patients History and Physical, chart, labs and discussed the procedure including the risks, benefits and alternatives for the proposed anesthesia with the patient or authorized representative who has indicated his/her understanding and acceptance.   Dental advisory given  Plan Discussed with: CRNA and Surgeon  Anesthesia Plan Comments:         Anesthesia Quick Evaluation

## 2012-11-25 NOTE — Progress Notes (Signed)
Alphonsa Overall PA returned call notified of previous note.No plans to remove splint cast tonight.Plans to evaluate left wrist and splint cast in morning.Treat patient pain for tonight.

## 2012-11-25 NOTE — Progress Notes (Signed)
Called by Pt at around 0710 because she noticed that there is blood draining out of her dressing.Md on call paged, able to talk to Dr Amanda Pea and he said to reinforce drsg.Pt requested to talk to Md., Dr Amanda Pea said that as he is not familiar with the patient he will call Dr Melvyn Novas and inform him of what is going and if possible he will call back and talk to the patient. Dressing reinforced, pt is given pain med. And ice pack applied. Will continue to monitor patient.

## 2012-11-25 NOTE — Transfer of Care (Signed)
Immediate Anesthesia Transfer of Care Note  Patient: Jodi Frank  Procedure(s) Performed: Procedure(s): EVACUATION HEMATOMA (Left)  Patient Location: PACU  Anesthesia Type:General  Level of Consciousness: awake, alert  and oriented  Airway & Oxygen Therapy: Patient Spontanous Breathing and Patient connected to nasal cannula oxygen  Post-op Assessment: Report given to PACU RN and Post -op Vital signs reviewed and stable  Post vital signs: Reviewed and stable  Complications: No apparent anesthesia complications

## 2012-11-25 NOTE — Progress Notes (Signed)
MD spoke with pt's husband at length regarding swelling in hand and plan of care.  Pt to go back to OR, consent signed. Lab results given to MD at 1310 in a second call.  OR on the way to get pt. At 1310.

## 2012-11-25 NOTE — Progress Notes (Signed)
PT Cancellation Note  Patient Details Name: PARISS HOMMES MRN: [id number] DOB: 1940/08/28   Cancelled Treatment:    Reason Eval/Treat Not Completed: Pain limiting ability to participate;Medical issues which prohibited therapy.  May need further s/x. 11/25/2012  [place] Bing, PT [phone]  (pager)   Paeton Latouche, Eliseo Gum 11/25/2012, 11:27 AM

## 2012-11-25 NOTE — Progress Notes (Signed)
Patient stated ,"I want this damn cast removed tonight call the MD back." Call placed to Alphonsa Overall PA spoke with patient over the telephone .New orders received for pain management .Will continue to monitor.

## 2012-11-25 NOTE — Progress Notes (Signed)
ANTICOAGULATION CONSULT NOTE - follow up Pharmacy Consult for Coumadin/Lovenox Indication: MVR - on coumadin PTA No Known Allergies  Patient Measurements: Height: 5\' 4"  (162.6 cm) Weight: 123 lb 7.3 oz (56 kg) IBW/kg (Calculated) : 54.7 Heparin Dosing Weight:    Vital Signs: Temp: 97.5 F (36.4 C) (07/03 0558) Temp src: Oral (07/03 0558) BP: 122/51 mmHg (07/03 0558) Pulse Rate: 62 (07/03 0558)  Labs:  Recent Labs  11/24/12 1418 11/25/12 0515  HGB 11.9* 11.1*  HCT 34.2* 33.6*  PLT 342 297  APTT 37  --   LABPROT 14.0 14.0  INR 1.10 1.10  CREATININE 0.59  --     Estimated Creatinine Clearance: 56.5 ml/min (by C-G formula based on Cr of 0.59).   Assessment: s/p fall with L wrist distal radius rx. Surgery 7/2 to restore alignment to wrist. Patient with recent THR for hip fx 11/03/12.  Pt noted to have blood draining out of her dressing per RN note this morning.  Dressing reinforced, ice pack applied and pain med given and MD contacted.  INR still baseline at 1.1.  H/H 11.1/33.6 and pltc 297.    Patient on Coumadin PTA for mechanical MVR at 7.5 mg daily except 10 mg on Tuesdays. Last dose 11/18/12.  She was on LMWH 60 mg sq q12h PTA.   CSR spoke with Dr. Orlan Leavens who ok'd bridging with LMWH.  Goal of Therapy:  INR 2.5-3.5 Monitor platelets by anticoagulation protocol: Yes   Plan:  1. Continue bridge therapy with Lovenox 55mg  sq q12 hrs.  2. Repeat Coumadin 10mg  po x 1  3. Daily PT/INR 4. F/u wound bleeding. Herby Abraham, Pharm.D. [phone] 11/25/2012 9:57 AM

## 2012-11-25 NOTE — Brief Op Note (Signed)
11/24/2012 - 11/25/2012  1:23 PM  PATIENT:  Jodi Frank  72 y.o. female  PRE-OPERATIVE DIAGNOSIS:  Post-op hematoma  POST-OPERATIVE DIAGNOSIS:  same  PROCEDURE:  Procedure(s): EVACUATION HEMATOMA (N/A)  SURGEON:  Surgeon(s) and Role:    * [name], MD - Primary  PHYSICIAN ASSISTANT: none  ASSISTANTS: none   ANESTHESIA:   general  EBL:     BLOOD ADMINISTERED:none  DRAINS: none   LOCAL MEDICATIONS USED:  NONE  SPECIMEN:  No Specimen  DISPOSITION OF SPECIMEN:  N/A  COUNTS:  YES  TOURNIQUET:  none  DICTATION: .Other Dictation: Dictation Number [id number]  PLAN OF CARE: Admit to inpatient   PATIENT DISPOSITION:  PACU - hemodynamically stable.   Delay start of Pharmacological VTE agent (>24hrs) due to surgical blood loss or risk of bleeding: not applicable

## 2012-11-26 MED ORDER — ENOXAPARIN SODIUM 60 MG/0.6ML ~~LOC~~ SOLN
55.0000 mg | Freq: Two times a day (BID) | SUBCUTANEOUS | Status: DC
  Administered 2012-11-26: 55 mg via SUBCUTANEOUS
  Administered 2012-11-27: 10:00:00 via SUBCUTANEOUS
  Administered 2012-11-27 – 2012-11-28 (×2): 55 mg via SUBCUTANEOUS
  Filled 2012-11-26 (×5): qty 0.6

## 2012-11-26 NOTE — Op Note (Signed)
NAMEKESHONA, KARTES NO.:  [id number]  MEDICAL RECORD NO.:  [id number]  LOCATION:  6N05C                        FACILITY:  [facility]  PHYSICIAN:  Madelynn Done, MD  DATE OF BIRTH:  22-May-1941  DATE OF PROCEDURE:  11/25/2012 DATE OF DISCHARGE:                              OPERATIVE REPORT   PREOPERATIVE DIAGNOSIS:  Left forearm, left distal radius postsurgical hematoma.  POSTOPERATIVE DIAGNOSIS:  Left forearm, left distal radius postsurgical hematoma.  ATTENDING PHYSICIAN:  [name] IV, MD scrubbed for the entire procedure.  ASSISTANT SURGEON:  None.  ANESTHESIA:  General via LMA.  SURGICAL PROCEDURE:  Evacuation of left wrist hematoma.  SURGICAL INDICATIONS:  Ms. Bratcher is a right-hand-dominant female underwent open reduction internal fixation last evening.  The patient is on anticoagulation therapy.  She continued to have persistent pain as well as hematoma collection at her incision site.  The patient was seen and evaluated on November 25, 2012, it is recommended that she undergo the above procedure.  Risks, benefits, and alternatives were discussed in detail with the patient and signed informed consent was obtained.  DESCRIPTION OF PROCEDURE:  The patient was properly identified in the preop holding area, a mark with permanent marker was made on left wrist to indicate the correct operative site.  The patient was then brought back to the operating room, placed supine on the anesthesia table. General anesthesia was administered.  The patient tolerated this well. A well-padded tourniquet was then placed on the left brachium and sealed with 1000 drape.  Left upper extremity was then prepped and draped in normal sterile fashion.  Time-out was called.  Correct side was identified, and procedure then begun.  Attention then turned to the left wrist where the skin sutures were then removed.  Upon opening up the skin sutures, the patient did have a large  hematoma, did not have any active bleeding.  The patient had a constant ooze from her incision site.  The wound was then thoroughly irrigated.  Small amounts of thrombin spray were then used on the superficial areas of the dermal layers.  The Surgicel package was then opened and placed into the wound and packed and held pressure which was able to obtain hemostasis with the Surgicel.  This was then removed.  The wound was then thoroughly irrigated.  Copious wound irrigation done throughout.  It did not appear to be any evidence of necrosis or any muscular damage in the deep layers of the pronator or FPL region.  After thorough irrigation, the wound was then closed loosely over #7 TLS drain and closed with 2-0 Prolene sutures.  Adaptic dressing, sterile compressive bandage then applied. The patient was placed in a short-arm splint, extubated, and taken to recovery room in good condition.  INTRAOPERATIVE DRAINS:  #7 TLS drain.  POSTPROCEDURE PLAN:  The patient admitted back to the inpatient service. Ice, activity, elevation, drain in place, a plan elected to likely drain for 24-48 hours.  Continue to follow her closely.  I will be in contact with the cardiologist starting her anticoagulation therapy.     Madelynn Done, MD  FWO/MEDQ  D:  11/25/2012  T:  11/26/2012  Job:  [id number]

## 2012-11-26 NOTE — Progress Notes (Addendum)
Spoke with pt about home health arrangements.  Pt chose [facility] for her agency. Start of care will be Tuesday--made patient aware of this and she was agreeable to that. Lovenox will be a copay with a Rx and her insurance. Address and phone number in EPIC confirmed as correct.

## 2012-11-26 NOTE — Progress Notes (Signed)
ANTICOAGULATION CONSULT NOTE - Follow Up Consult  Pharmacy Consult for Lovenox Indication: mitral valve replacement  No Known Allergies  Patient Measurements: Height: 5\' 4"  (162.6 cm) Weight: 123 lb 7.3 oz (56 kg) IBW/kg (Calculated) : 54.7  Vital Signs: Temp: 99 F (37.2 C) (07/04 0635) Temp src: Oral (07/04 0635) BP: 145/55 mmHg (07/04 1018) Pulse Rate: 72 (07/04 1018)  Labs:  Recent Labs  11/24/12 1418 11/25/12 0515 11/25/12 1200 11/25/12 1455 11/26/12 0645  HGB 11.9* 11.1* 10.1*  --   --   HCT 34.2* 33.6* 29.3*  --   --   PLT 342 297 288  --   --   APTT 37  --   --   --   --   LABPROT 14.0 14.0  --  15.6* 18.8*  INR 1.10 1.10  --  1.27 1.62*  CREATININE 0.59  --   --   --   --     Estimated Creatinine Clearance: 56.5 ml/min (by C-G formula based on Cr of 0.59).   Medications:  Scheduled:  . amLODipine  5 mg Oral QPM  .  ceFAZolin (ANCEF) IV  1 g Intravenous Q8H  . docusate sodium  100 mg Oral BID  .  HYDROmorphone (DILAUDID) injection  1 mg Intravenous Once  . labetalol  600 mg Oral BID  . multivitamin with minerals  1 tablet Oral Daily  . phytonadione (VITAMIN K) IV  2 mg Intravenous Once  . venlafaxine XR  150 mg Oral q morning - 10a  . vitamin C  1,000 mg Oral Daily    Assessment: 72 year old female on chronic anticoagulation with Coumadin for mitral valve replacement.  Her Coumadin was held and she was bridged with Lovenox for wrist surgery performed 7/2.  Lovenox and Coumadin were resumed 7/2, and she developed a hematoma at her surgical site that required surgical evacuation 7/3.  Anticoagulation was held, and her INR is 1.6 today.  Per Dr. Melvyn Novas, restart Lovenox tonight.  Goal of Therapy:  INR 2.5-3.5 Monitor platelets by anticoagulation protocol: Yes   Plan:  Start Lovenox 55mg  SQ q12h this evening Check CBC with AM labs Follow for anticipated Coumadin restart in the next few days  Estella Husk, Pharm.D., BCPS Clinical  Pharmacist Phone: [phone] or [phone] Pager: [phone] 11/26/2012, 10:39 AM

## 2012-11-26 NOTE — Evaluation (Signed)
Physical Therapy Evaluation Patient Details Name: Jodi Frank MRN: [id number] DOB: 02-04-1941 Today's Date: 11/26/2012 Time: 4098-1191 PT Time Calculation (min): 29 min  PT Assessment / Plan / Recommendation History of Present Illness  Pt returns to hospital for realignment of non operative L wrist fx that occurred back in June; s/p ORIF and then return to evacuate hematoma.  Clinical Impression  From a mobility standpoint, pt is at an independent level.  Completed education on L UE active/active assist ROM at fingers, elbow and shoulder.  Also discussed positioning for limiting swelling and demonstrated basic technique for retrograde massage for any swelling that does occur.  No further PT needs at this time until casting is removed.     PT Assessment  Patent does not need any further PT services (but will need OPPT or OT services for the wrist--later time)    Follow Up Recommendations  No PT follow up    Does the patient have the potential to tolerate intense rehabilitation      Barriers to Discharge        Equipment Recommendations  None recommended by PT    Recommendations for Other Services     Frequency      Precautions / Restrictions Precautions Precautions: Fall (minimal risk) Required Braces or Orthoses: Other Brace/Splint Other Brace/Splint: left UE splint/cast  radius fx Restrictions Weight Bearing Restrictions: Yes LUE Weight Bearing: Non weight bearing LLE Weight Bearing: Weight bearing as tolerated   Pertinent Vitals/Pain 1/10 pain L UE      Mobility  Bed Mobility Bed Mobility: Supine to Sit;Sitting - Scoot to Edge of Bed Supine to Sit: 6: Modified independent (Device/Increase time) Sitting - Scoot to Edge of Bed: 7: Independent Transfers Transfers: Sit to Stand;Stand to Sit Sit to Stand: 6: Modified independent (Device/Increase time) Stand to Sit: 6: Modified independent (Device/Increase time) Ambulation/Gait Ambulation/Gait Assistance: 7:  Independent Ambulation Distance (Feet): 300 Feet (or greater) Assistive device: None Ambulation/Gait Assistance Details: steady gait with mildly noticeable limp Gait Pattern: Step-through pattern Stairs: Yes Stairs Assistance: 6: Modified independent (Device/Increase time) Stair Management Technique: Step to pattern;Forwards;One rail Right Number of Stairs: 4 Wheelchair Mobility Wheelchair Mobility: No    Exercises     PT Diagnosis:    PT Problem List:   PT Treatment Interventions:       PT Goals(Current goals can be found in the care plan section)    Visit Information  Last PT Received On: 11/26/12 Assistance Needed: +1 History of Present Illness: Pt returns to hospital for realignment of non operative L wrist fx that occurred back in June; s/p ORIF and then return to evacuate hematoma.       Prior Functioning  Home Living Family/patient expects to be discharged to:: Private residence Living Arrangements: Alone Type of Home: House Home Access: Level entry Home Layout: One level Home Equipment: Shower seat - built in;Cane - single point Prior Function Level of Independence: Independent Comments: training and development at [facility] Communication Communication: No difficulties    Cognition  Cognition Arousal/Alertness: Awake/alert Behavior During Therapy: WFL for tasks assessed/performed Overall Cognitive Status: Within Functional Limits for tasks assessed    Extremity/Trunk Assessment Upper Extremity Assessment Upper Extremity Assessment: LUE deficits/detail RUE Deficits / Details: minimal finger movement and otherwise general weakness from decr activity L UE Lower Extremity Assessment Lower Extremity Assessment: Overall WFL for tasks assessed Cervical / Trunk Assessment Cervical / Trunk Assessment: Normal   Balance    End of Session PT - End  of Session Activity Tolerance: Patient tolerated treatment well Patient left: Other (comment) (up walking in  halls independently) Nurse Communication: Mobility status  GP Functional Assessment Tool Used: clinical judgement Functional Limitation: Mobility: Walking and moving around Mobility: Walking and Moving Around Current Status [phone]): 0 percent impaired, limited or restricted Mobility: Walking and Moving Around Goal Status ([id number]): 0 percent impaired, limited or restricted Mobility: Walking and Moving Around Discharge Status [phone]): 0 percent impaired, limited or restricted   Sonora Catlin, Eliseo Gum 11/26/2012, 3:45 PM 11/26/2012  [place] Bing, PT [phone]  (pager)

## 2012-11-26 NOTE — Progress Notes (Signed)
Encouraged and explained the importance of keeping the left elbow/arm elevated on multiple pillows about the heart. She stated that she would "rather have it propped on the 3 pillows, so it's more comfortable."

## 2012-11-26 NOTE — Progress Notes (Signed)
PT SEEN/EXAMINED  DOING MUCH BETTER THIS AM COMFORTABLE PAIN CONTROLLED DRAIN WORKING IN PLACE FINGERS WARM WELL PERFUSED PLAN: CONTINUE WITH INPATIENT CARE DRAIN IV ABX WILL RESUME LOVENOX AS DISCUSSED WITH HER CARDIOLOGIST THIS PM START COUMADIN TOMORROW PM INR NOTED TODAY 1.6 WILL SEE IN AM POSSIBLE DRAIN REMOVAL

## 2012-11-27 LAB — CBC
HCT: 23.5 % — ABNORMAL LOW (ref 36.0–46.0)
Hemoglobin: 8.1 g/dL — ABNORMAL LOW (ref 12.0–15.0)
MCH: 28.3 pg (ref 26.0–34.0)
RBC: 2.86 MIL/uL — ABNORMAL LOW (ref 3.87–5.11)

## 2012-11-27 LAB — PROTIME-INR: Prothrombin Time: 16.6 seconds — ABNORMAL HIGH (ref 11.6–15.2)

## 2012-11-27 MED ORDER — CEPHALEXIN 500 MG PO CAPS
500.0000 mg | ORAL_CAPSULE | Freq: Four times a day (QID) | ORAL | Status: DC
  Administered 2012-11-27 – 2012-11-28 (×4): 500 mg via ORAL
  Filled 2012-11-27 (×8): qty 1

## 2012-11-27 MED ORDER — WARFARIN - PHARMACIST DOSING INPATIENT
Freq: Every day | Status: DC
  Administered 2012-11-27: 18:00:00

## 2012-11-27 MED ORDER — WARFARIN SODIUM 7.5 MG PO TABS
7.5000 mg | ORAL_TABLET | Freq: Once | ORAL | Status: AC
  Administered 2012-11-27: 7.5 mg via ORAL
  Filled 2012-11-27: qty 1

## 2012-11-27 NOTE — Progress Notes (Signed)
ANTICOAGULATION CONSULT NOTE - Follow Up Consult  Pharmacy Consult for Lovenox & Coumadin Indication: mitral valve replacement  No Known Allergies  Patient Measurements: Height: 5\' 4"  (162.6 cm) Weight: 123 lb 7.3 oz (56 kg) IBW/kg (Calculated) : 54.7  Vital Signs: Temp: 98.2 F (36.8 C) (07/05 0532) Temp src: Oral (07/05 0532) BP: 134/54 mmHg (07/05 0532) Pulse Rate: 72 (07/05 0532)  Labs:  Recent Labs  11/24/12 1418 11/25/12 0515 11/25/12 1200 11/25/12 1455 11/26/12 0645 11/27/12 0540  HGB 11.9* 11.1* 10.1*  --   --  8.1*  HCT 34.2* 33.6* 29.3*  --   --  23.5*  PLT 342 297 288  --   --  235  APTT 37  --   --   --   --   --   LABPROT 14.0 14.0  --  15.6* 18.8* 16.6*  INR 1.10 1.10  --  1.27 1.62* 1.38  CREATININE 0.59  --   --   --   --   --     Estimated Creatinine Clearance: 56.5 ml/min (by C-G formula based on Cr of 0.59).   Medications:  Scheduled:  . amLODipine  5 mg Oral QPM  . cephALEXin  500 mg Oral Q6H  . docusate sodium  100 mg Oral BID  . enoxaparin (LOVENOX) injection  55 mg Subcutaneous Q12H  .  HYDROmorphone (DILAUDID) injection  1 mg Intravenous Once  . labetalol  600 mg Oral BID  . multivitamin with minerals  1 tablet Oral Daily  . phytonadione (VITAMIN K) IV  2 mg Intravenous Once  . venlafaxine XR  150 mg Oral q morning - 10a  . vitamin C  1,000 mg Oral Daily    Assessment: 72 year old female on chronic anticoagulation with Coumadin for mitral valve replacement.  Her Coumadin was held and she was bridged with Lovenox for wrist surgery performed 7/2.  Lovenox and Coumadin were resumed 7/2, and she developed a hematoma at her surgical site that required surgical evacuation 7/3.  Anticoagulation was held, and her INR is 1.4 today.  Lovenox was resumed 7/4 and OK to restart Coumadin per Dr. Melvyn Novas today.  Her hemoglobin has trended down, as would be expected with a hematoma and repeat surgery.  Prior to admission her Coumadin regimen was  10mg  on Tuesdays and 7.5mg  all other days.  Goal of Therapy:  INR 2.5-3.5 Monitor platelets by anticoagulation protocol: Yes   Plan:  Continue Lovenox 55mg  SQ q12h  Give Coumadin 7.5mg  today per Dr. Glenna Durand consult request. Check PT/INR and CBC in AM  Estella Husk, Pharm.D., BCPS, AAHIVP Clinical Pharmacist Phone: [phone] or [phone] 11/27/2012, 10:22 AM

## 2012-11-27 NOTE — Progress Notes (Signed)
Subjective: 2 Days Post-Op Procedure(s) (LRB): EVACUATION HEMATOMA (Left) Patient reports pain as mild Pt feels better   Objective: Vital signs in last 24 hours: Temp:  [98.2 F (36.8 C)-98.4 F (36.9 C)] 98.2 F (36.8 C) (07/05 0532) Pulse Rate:  [71-73] 72 (07/05 0532) Resp:  [16-18] 18 (07/05 0532) BP: (127-147)/(41-55) 134/54 mmHg (07/05 0532) SpO2:  [96 %-98 %] 98 % (07/05 0532)  Intake/Output from previous day: 07/04 0701 - 07/05 0700 In: 2401 [P.O.:840; I.V.:1511; IV Piggyback:50] Out: 0  Intake/Output this shift: Total I/O In: 480 [P.O.:480] Out: -    Recent Labs  11/24/12 1418 11/25/12 0515 11/25/12 1200 11/27/12 0540  HGB 11.9* 11.1* 10.1* 8.1*    Recent Labs  11/25/12 1200 11/27/12 0540  WBC 7.6 3.9*  RBC 3.59* 2.86*  HCT 29.3* 23.5*  PLT 288 235    Recent Labs  11/24/12 1418  NA 132*  K 4.2  CL 96  CO2 25  BUN 12  CREATININE 0.59  GLUCOSE 108*  CALCIUM 9.8    Recent Labs  11/26/12 0645 11/27/12 0540  INR 1.62* 1.38    Left wrist: drain removed Pt tolerated splint clean/dry/intact  Wiggles fingers fingers warm well perfused Assessment/Plan: 2 Days Post-Op Procedure(s) (LRB): EVACUATION HEMATOMA (Left) DOING MUCH BETTER ACUTE BLOOD LOSS ANEMIA- PT WITHOUT SYMPTOMS OF LIGHTHEADEDNESS OR DIZZINESS WILL WATCH TODAY CONTINUE WITH ANTICOAGULATION RESTART COUMADIN PER DISCUSSION WITH CARDIOLOGIST TONIGHT LOWER DOSE HOPE TO GO HOME IN AM  Bradly Bienenstock W 11/27/2012, 10:05 AM

## 2012-11-28 LAB — CBC
Hemoglobin: 8.4 g/dL — ABNORMAL LOW (ref 12.0–15.0)
MCH: 28.4 pg (ref 26.0–34.0)
RBC: 2.96 MIL/uL — ABNORMAL LOW (ref 3.87–5.11)

## 2012-11-28 LAB — PROTIME-INR
INR: 1.17 (ref 0.00–1.49)
Prothrombin Time: 14.7 seconds (ref 11.6–15.2)

## 2012-11-28 MED ORDER — DOCUSATE SODIUM 100 MG PO CAPS: 100.0000 mg | ORAL_CAPSULE | Freq: Two times a day (BID) | ORAL | Status: DC

## 2012-11-28 MED ORDER — KETOROLAC TROMETHAMINE 30 MG/ML IJ SOLN: 30.0000 mg | Freq: Once | INTRAMUSCULAR | Status: DC

## 2012-11-28 MED ORDER — HYDROCODONE-ACETAMINOPHEN 5-300 MG PO TABS: 1.0000 | ORAL_TABLET | Freq: Four times a day (QID) | ORAL | Status: DC | PRN

## 2012-11-28 MED ORDER — VITAMIN C 500 MG PO TABS: 500.0000 mg | ORAL_TABLET | Freq: Every day | ORAL | Status: DC

## 2012-11-28 MED ORDER — CEPHALEXIN 500 MG PO CAPS: 500.0000 mg | ORAL_CAPSULE | Freq: Four times a day (QID) | ORAL | Status: DC

## 2012-11-28 MED ORDER — WARFARIN SODIUM 7.5 MG PO TABS
7.5000 mg | ORAL_TABLET | Freq: Once | ORAL | Status: DC
  Filled 2012-11-28: qty 1

## 2012-11-28 MED ORDER — ENOXAPARIN SODIUM 60 MG/0.6ML ~~LOC~~ SOLN: 55.0000 mg | Freq: Two times a day (BID) | SUBCUTANEOUS | Status: DC

## 2012-11-28 MED ORDER — FERROUS SULFATE 325 (65 FE) MG PO TABS: 325.0000 mg | ORAL_TABLET | Freq: Every day | ORAL | Status: DC

## 2012-11-28 NOTE — Discharge Summary (Signed)
Physician Discharge Summary  Patient ID: Jodi Frank MRN: [id number] DOB/AGE: [birth year] 72 y.o.  Admit date: 11/24/2012 Discharge date: 11/28/2012  Admission Diagnoses: Post-op hematoma Past Medical History  Diagnosis Date  . Depression   . Diarrhea   . 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 Hypertension   . Broken wrist     Discharge Diagnoses:  LEFT DISTAL RADIUS FRACTURE ACUTE BLOOD LOSS ANEMIA  Surgeries: Procedure(s):LEFT WRIST ORIF ON 11/24/2012 EVACUATION HEMATOMA on 11/24/2012 - 11/25/2012    Consultants:  NONE  Discharged Condition: Improved  Hospital Course: JOSCELINE Frank is an 72 y.o. female who was admitted 11/24/2012 with a chief complaint of No chief complaint on file. , and found to have a diagnosis of Post-op hematoma.  They were brought to the operating room on 11/24/2012 - 11/25/2012 and underwent Procedure(s):ORIF OF LEFT DISTAL RADIUS FRACTURE COMPLICATED BY RETURN TO OR FOR  EVACUATION HEMATOMA.    They were given perioperative antibiotics: Anti-infectives   Start     Dose/Rate Route Frequency Ordered Stop   11/28/12 0000  cephALEXin (KEFLEX) 500 MG capsule     500 mg Oral 4 times daily 11/28/12 1030     11/27/12 1015  cephALEXin (KEFLEX) capsule 500 mg     500 mg Oral 4 times per day 11/27/12 1013     11/25/12 2200  ceFAZolin (ANCEF) IVPB 1 g/50 mL premix  Status:  Discontinued     1 g 100 mL/hr over 30 Minutes Intravenous 3 times per day 11/25/12 1856 11/25/12 1902   11/25/12 0100  ceFAZolin (ANCEF) IVPB 1 g/50 mL premix  Status:  Discontinued     1 g 100 mL/hr over 30 Minutes Intravenous Every 8 hours 11/24/12 2106 11/27/12 1013   11/25/12 0000  ceFAZolin (ANCEF) IVPB 1 g/50 mL premix  Status:  Discontinued     1 g 100 mL/hr over 30 Minutes Intravenous NOW 11/24/12 2106 11/24/12 2107   11/24/12 1600  ceFAZolin (ANCEF) IVPB 2 g/50 mL premix     2 g 100 mL/hr over 30 Minutes Intravenous On call to O.R. 11/24/12 1422 11/24/12 1644   11/24/12 1441   ceFAZolin (ANCEF) 2-3 GM-% IVPB SOLR    Comments:  SAVAGE, MICHELLE: cabinet override      11/24/12 1441 11/25/12 0244    .  They were given sequential compression devices, early ambulation, and Coumadin for DVT prophylaxis.  Recent vital signs: Patient Vitals for the past 24 hrs:  BP Temp Temp src Pulse Resp SpO2  11/28/12 0451 138/49 mmHg 98.5 F (36.9 C) Oral 72 16 94 %  11/27/12 2104 149/52 mmHg 97.7 F (36.5 C) Oral 77 18 98 %  11/27/12 1409 128/47 mmHg 98.1 F (36.7 C) Oral 65 16 98 %  .  Recent laboratory studies: No results found.  Discharge Medications:     Medication List         amLODipine 5 MG tablet  Commonly known as:  NORVASC  Take 5 mg by mouth every evening.     cephALEXin 500 MG capsule  Commonly known as:  KEFLEX  Take 1 capsule (500 mg total) by mouth 4 (four) times daily.     docusate sodium 100 MG capsule  Commonly known as:  COLACE  Take 1 capsule (100 mg total) by mouth 2 (two) times daily.     docusate sodium 100 MG capsule  Commonly known as:  COLACE  Take 1 capsule (100 mg total) by mouth  2 (two) times daily.     enoxaparin 60 MG/0.6ML injection  Commonly known as:  LOVENOX  Inject 0.55 mLs (55 mg total) into the skin every 12 (twelve) hours.     enoxaparin 60 MG/0.6ML injection  Commonly known as:  LOVENOX  Inject 60 mg into the skin every 12 (twelve) hours.     fish oil-omega-3 fatty acids 1000 MG capsule  Take 2 g by mouth 2 (two) times daily.     GENTEAL 0.25-0.3 % Gel  Generic drug:  Carboxymethylcell-Hypromellose  Apply 1 application to eye every evening.     Hydrocodone-Acetaminophen 5-300 MG Tabs  Commonly known as:  VICODIN  Take 1 tablet by mouth 4 (four) times daily as needed (PAIN).     labetalol 300 MG tablet  Commonly known as:  NORMODYNE  Take 600 mg by mouth 2 (two) times daily.     methocarbamol 500 MG tablet  Commonly known as:  ROBAXIN  Take 1 tablet (500 mg total) by mouth every 6 (six) hours as needed.      methocarbamol 500 MG tablet  Commonly known as:  ROBAXIN  Take 1 tablet (500 mg total) by mouth 4 (four) times daily.     multivitamin with minerals Tabs  Take 1 tablet by mouth daily.     oxyCODONE-acetaminophen 5-325 MG per tablet  Commonly known as:  PERCOCET/ROXICET  Take one tablet by mouth every 4 to 8 hours as needed for pain.     oxyCODONE-acetaminophen 5-325 MG per tablet  Commonly known as:  ROXICET  Take 1 tablet by mouth every 4 (four) hours as needed for pain.     venlafaxine XR 150 MG 24 hr capsule  Commonly known as:  EFFEXOR-XR  Take 150 mg by mouth every morning.     vitamin C 500 MG tablet  Commonly known as:  ASCORBIC ACID  Take 1 tablet (500 mg total) by mouth daily.     vitamin C 500 MG tablet  Commonly known as:  ASCORBIC ACID  Take 1 tablet (500 mg total) by mouth daily.     warfarin 10 MG tablet  Commonly known as:  COUMADIN  Take 10 mg by mouth See admin instructions. Only on Tuesday evenings     warfarin 7.5 MG tablet  Commonly known as:  COUMADIN  Take 7.5 mg by mouth as directed. Take every night except Tuesday        Diagnostic Studies: Dg Chest 2 View  11/24/2012   *RADIOLOGY REPORT*  Clinical Data: ORIF of left wrist  CHEST - 2 VIEW  Comparison: 11/02/2012  Findings: Normal heart size.  No pleural effusion or pulmonary edema.  The lungs are hyperinflated.  There are coarsened interstitial markings noted bilaterally.  No airspace consolidation identified.  Mild spondylosis noted within the thoracic spine.  IMPRESSION:  1.  No active disease.   Original Report Authenticated By: Signa Kell, M.D.   Dg Hip Operative Left  11/03/2012   *RADIOLOGY REPORT*  Clinical Data: 72 year old female ORIF left hip.  Fluoroscopy time:  0 minutes 30 seconds.  OPERATIVE LEFT HIP  Comparison: 07/23/2008.  Findings: Two intraoperative fluoroscopic views of the proximal left femur.  Two cannulated screws have been placed traversing a proximal left femoral neck  fracture.  Alignment appears near anatomic.  Hardware intact.  IMPRESSION: ORIF proximal left femur with no adverse features.   Original Report Authenticated By: Erskine Speed, M.D.   Dg Chest Port 1 View  11/02/2012   *  RADIOLOGY REPORT*  Clinical Data: Left wrist and hip fractures.  Preoperative respiratory exam.  PORTABLE CHEST - 1 VIEW  Comparison: None.  Findings: The patient is status post prior median sternotomy. Heart size and mediastinal contours are within normal limits. Lungs show no evidence of edema, consolidation or significant pleural fluid.  Scarring and minimal subsegmental atelectasis is present at both lung bases.  The visualized bony thorax is unremarkable.  IMPRESSION: No active disease.   Original Report Authenticated By: Irish Lack, M.D.    They benefited maximally from their hospital stay and there were no complications.     Disposition: 03-Skilled Nursing Facility      Follow-up Information   Follow up with [name], MD. Schedule an appointment as soon as possible for a visit in 12 days.   Contact information:   [address] [place] [address] [phone]      PT SEEN/EXAMINED ON DAY OF D/C FELT READY TO GO HOME  Signed: [name] 11/28/2012, 10:33 AM

## 2012-11-28 NOTE — Discharge Summary (Signed)
PLEASE SEE OTHER D/C SUMMARY

## 2012-11-28 NOTE — Progress Notes (Signed)
ANTICOAGULATION CONSULT NOTE - Follow Up Consult  Pharmacy Consult for Lovenox & Coumadin Indication: mitral valve replacement  No Known Allergies  Patient Measurements: Height: 5\' 4"  (162.6 cm) Weight: 123 lb 7.3 oz (56 kg) IBW/kg (Calculated) : 54.7  Vital Signs: Temp: 98.5 F (36.9 C) (07/06 0451) Temp src: Oral (07/06 0451) BP: 138/49 mmHg (07/06 0451) Pulse Rate: 72 (07/06 0451)  Labs:  Recent Labs  11/25/12 1200  11/26/12 0645 11/27/12 0540 11/28/12 0505  HGB 10.1*  --   --  8.1* 8.4*  HCT 29.3*  --   --  23.5* 24.5*  PLT 288  --   --  235 249  LABPROT  --   < > 18.8* 16.6* 14.7  INR  --   < > 1.62* 1.38 1.17  < > = values in this interval not displayed.  Estimated Creatinine Clearance: 56.5 ml/min (by C-G formula based on Cr of 0.59).   Medications:  Scheduled:  . amLODipine  5 mg Oral QPM  . cephALEXin  500 mg Oral Q6H  . docusate sodium  100 mg Oral BID  . enoxaparin (LOVENOX) injection  55 mg Subcutaneous Q12H  .  HYDROmorphone (DILAUDID) injection  1 mg Intravenous Once  . labetalol  600 mg Oral BID  . multivitamin with minerals  1 tablet Oral Daily  . phytonadione (VITAMIN K) IV  2 mg Intravenous Once  . venlafaxine XR  150 mg Oral q morning - 10a  . vitamin C  1,000 mg Oral Daily  . Warfarin - Pharmacist Dosing Inpatient   Does not apply q1800    Assessment: 72 year old female on chronic anticoagulation with Coumadin for mitral valve replacement.  Her Coumadin was held and she was bridged with Lovenox for wrist surgery performed 7/2.  Lovenox and Coumadin were resumed 7/2, and she developed a hematoma at her surgical site that required surgical evacuation 7/3.  Anticoagulation was held.  Lovenox was resumed 7/4 and Coumadin was resumed 7/5.  INR is 1.14 today.  Her hemoglobin had trended down, as would be expected with a hematoma and repeat surgery but has stabilized today.  Platelet count is stable.  Prior to admission her Coumadin regimen was  10mg  on Tuesdays and 7.5mg  all other days.  Goal of Therapy:  INR 2.5-3.5 Monitor platelets by anticoagulation protocol: Yes   Plan:  Continue Lovenox 55mg  SQ q12h  Give Coumadin 7.5mg  today. Check PT/INR in AM.  Estella Husk, Pharm.D., BCPS, AAHIVP Clinical Pharmacist Phone: [phone] or [phone] 11/28/2012, 10:17 AM

## 2012-11-29 ENCOUNTER — Encounter (HOSPITAL_COMMUNITY): Payer: Self-pay | Admitting: Orthopedic Surgery

## 2012-12-07 NOTE — Progress Notes (Signed)
Patient ID: Jodi Frank, female   DOB: [birth year], 72 y.o.   MRN: [id number]        HISTORY & PHYSICAL  DATE: 11/11/2012   FACILITY: [facility]  LEVEL OF CARE: SNF (31)  ALLERGIES:  No Known Allergies  CHIEF COMPLAINT:  Manage left hip fracture, acute blood loss anemia, and hypertension.   HISTORY OF PRESENT ILLNESS:  The patient is a 72 year-old, Caucasian female.   HIP FRACTURE: The patient had a mechanical fall and sustained a femur fracture.  Patient subsequently underwent surgical repair and tolerated the procedure well. Patient is admitted to this facility for short-term rehabilitation. Patient denies hip pain currently. No complications reported from the pain medications currently being used.   ANEMIA: Postoperatively, patient suffered acute blood loss.   The anemia has been stable. The patient denies fatigue, melena or hematochezia.  The patient is currently not on iron.    Hemoglobins are:  9.7, 9.8.  HTN: Pt 's HTN remains stable.  Denies CP, sob, DOE, pedal edema, headaches, dizziness or visual disturbances.  No complications from the medications currently being used.  Last BP : 145/72, 168/71, 131/62.    PAST MEDICAL HISTORY :  Past Medical History  Diagnosis Date  . Depression   . Diarrhea   . 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 Hypertension   . Broken wrist     PAST SURGICAL HISTORY: Past Surgical History  Procedure Laterality Date  . Mitral valve replacement      Pt. is on coumadin.  . Shoulder open rotator cuff repair    . Hip pinning,cannulated Left 11/03/2012    Procedure: CANNULATED HIP PINNING;  Surgeon: Jacki Cones, MD;  Location: [facility];  Service: Orthopedics;  Laterality: Left;  . Open reduction internal fixation (orif) wrist with iliac crest bone graft Left 11/24/2012    Procedure: OPEN REDUCTION INTERNAL FIXATION LEFT WRIST POSSIBLE BONE GRAFTING AND PINNING (ORIF) ;  Surgeon: [name], MD;  Location: [facility];   Service: Orthopedics;  Laterality: Left;  . Hematoma evacuation Left 11/25/2012    Procedure: EVACUATION HEMATOMA;  Surgeon: [name], MD;  Location: [facility] OR;  Service: Orthopedics;  Laterality: Left;    SOCIAL HISTORY:  reports that she has never smoked. She has never used smokeless tobacco. She reports that she drinks about 4.2 ounces of alcohol per week. She reports that she does not use illicit drugs.  FAMILY HISTORY:  Family History  Problem Relation Age of Onset  . Stroke Mother   . Colon cancer Paternal Grandmother     CURRENT MEDICATIONS: Reviewed per [facility]  REVIEW OF SYSTEMS:  See HPI otherwise 14 point ROS is negative.  PHYSICAL EXAMINATION  VS:  T 97.4       P 69      RR 16      BP 145/72      POX 95%        WT (Lb)  GENERAL: no acute distress, normal body habitus EYES: conjunctivae normal, sclerae normal, normal eye lids MOUTH/THROAT: lips without lesions,no lesions in the mouth,tongue is without lesions,uvula elevates in midline NECK: supple, trachea midline, no neck masses, no thyroid tenderness, no thyromegaly LYMPHATICS: no LAN in the neck, no supraclavicular LAN RESPIRATORY: breathing is even & unlabored, BS CTAB CARDIAC: RRR, no murmur,no extra heart sounds, no edema GI:  ABDOMEN: abdomen soft, normal BS, no masses, no tenderness  LIVER/SPLEEN: no hepatomegaly, no  splenomegaly MUSCULOSKELETAL: HEAD: normal to inspection & palpation BACK: no kyphosis, scoliosis or spinal processes tenderness EXTREMITIES: LEFT UPPER EXTREMITY: in a cast RIGHT UPPER EXTREMITY:  full range of motion, normal strength & tone LEFT LOWER EXTREMITY: strength intact, range of motion not tested due to surgery  RIGHT LOWER EXTREMITY:  full range of motion, normal strength & tone PSYCHIATRIC: the patient is alert & oriented to person, affect & behavior appropriate  LABS/RADIOLOGY: Hemoglobin 9.8, MCV 84.8, platelets 434, WBC 5.  BMP normal.    Left hip x-ray showed left hip  fracture.    Left wrist x-ray showed dorsally angulated distal radius fracture.    Left hip x-ray postoperatively showed ORIF of proximal left femur fracture.    CT of the head:  No acute findings.    Chest x-ray:  No acute disease.    MRSA by PCR negative.    Staph aureus by PCR negative.   Sodium 129, glucose 123, otherwise BMP normal.    Hemoglobin 9.7, MCV 82.6, otherwise CBC normal.    ASSESSMENT/PLAN:  Left hip fracture.  Status post ORIF.  Continue rehabilitation.    Acute blood loss anemia.  Hemoglobin stable.  Iron was started.  Recheck pending.    Hypertension.  Monitor blood pressures.    Left wrist fracture.  Continue splint and cast.    Depression.  Continue Effexor.    I have reviewed patient's medical records received at admission/from hospitalization.  CPT CODE: 16109

## 2012-12-24 DIAGNOSIS — K644 Residual hemorrhoidal skin tags: Secondary | ICD-10-CM | POA: Insufficient documentation

## 2013-01-14 DIAGNOSIS — M810 Age-related osteoporosis without current pathological fracture: Secondary | ICD-10-CM | POA: Insufficient documentation

## 2013-05-13 NOTE — Progress Notes (Signed)
This encounter was created in error - please disregard.

## 2014-01-03 DIAGNOSIS — T502X5A Adverse effect of carbonic-anhydrase inhibitors, benzothiadiazides and other diuretics, initial encounter: Secondary | ICD-10-CM | POA: Insufficient documentation

## 2014-07-12 DIAGNOSIS — E782 Mixed hyperlipidemia: Secondary | ICD-10-CM | POA: Insufficient documentation

## 2014-11-20 ENCOUNTER — Other Ambulatory Visit: Payer: Self-pay

## 2015-05-04 ENCOUNTER — Ambulatory Visit (INDEPENDENT_AMBULATORY_CARE_PROVIDER_SITE_OTHER): Payer: Medicare Other | Admitting: Podiatry

## 2015-05-04 ENCOUNTER — Encounter: Payer: Self-pay | Admitting: Podiatry

## 2015-05-04 ENCOUNTER — Ambulatory Visit (INDEPENDENT_AMBULATORY_CARE_PROVIDER_SITE_OTHER): Payer: Medicare Other

## 2015-05-04 VITALS — BP 156/85 | HR 88 | Resp 16 | Ht 64.0 in | Wt 145.0 lb

## 2015-05-04 DIAGNOSIS — M79673 Pain in unspecified foot: Secondary | ICD-10-CM

## 2015-05-04 DIAGNOSIS — M722 Plantar fascial fibromatosis: Secondary | ICD-10-CM

## 2015-05-04 MED ORDER — TRIAMCINOLONE ACETONIDE 10 MG/ML IJ SUSP
10.0000 mg | Freq: Once | INTRAMUSCULAR | Status: AC
  Administered 2015-05-04: 10 mg

## 2015-05-04 NOTE — Progress Notes (Signed)
   Subjective:    Patient ID: Jodi Frank, female    DOB: 1940/10/02, 74 y.o.   MRN: [id number]  HPI Patient presents with foot pain in their left foot, heel. Pt stated, "hurts more to get out of bed during the night time"; x2 months.  Patient also wants to discuss doing EPAT procedure.   Review of Systems  All other systems reviewed and are negative.      Objective:   Physical Exam        Assessment & Plan:

## 2015-05-04 NOTE — Patient Instructions (Signed)

## 2015-05-06 NOTE — Progress Notes (Signed)
Subjective:     Patient ID: Jodi Frank, female   DOB: May 08, 1941, 74 y.o.   MRN: [id number]  HPI patient states I'm having pain in my left heel for more than several months and I've had 1 previous injection which did not give me relief of symptoms   Review of Systems  All other systems reviewed and are negative.      Objective:   Physical Exam  Constitutional: She is oriented to person, place, and time.  Cardiovascular: Intact distal pulses.   Musculoskeletal: Normal range of motion.  Neurological: She is oriented to person, place, and time.  Skin: Skin is warm.  Nursing note and vitals reviewed.  neurovascular status found to be intact muscle strength adequate range of motion within normal limits with quite a bit of discomfort in the plantar heel left at the insertional point tendon the calcaneus with fluid buildup noted. Moderate depression of the arch is noted and patient has good digital perfusion and is well oriented 3     Assessment:     Acute plantar fasciitis left with failure to respond so far to conservative treatments    Plan:     H&P condition reviewed with patient and careful injection administered 3 mg Kenalog 5 mg Xylocaine along with fascial brace. I instructed this patient on shoe gear modifications and if symptoms were to stay severe we will consider shockwave therapy

## 2015-05-11 ENCOUNTER — Ambulatory Visit (INDEPENDENT_AMBULATORY_CARE_PROVIDER_SITE_OTHER): Payer: Medicare Other | Admitting: Podiatry

## 2015-05-11 ENCOUNTER — Encounter: Payer: Self-pay | Admitting: Podiatry

## 2015-05-11 VITALS — BP 152/74 | HR 84 | Resp 12

## 2015-05-11 DIAGNOSIS — M722 Plantar fascial fibromatosis: Secondary | ICD-10-CM

## 2015-05-20 NOTE — Progress Notes (Signed)
Subjective:     Patient ID: Jodi Frank, female   DOB: 10-30-1940, 74 y.o.   MRN: [id number]  HPI patient states it seems to be some improved but not total   Review of Systems     Objective:   Physical Exam  neurovascular status unchanged with discomfort in the plantar heel right that is improving but is still sore at the medial band especially after periods of sitting and when getting up in the morning    Assessment:      continued plantar fasciitis right with moderate improvement    Plan:      reinjected the plantar fascia 3 Milligan Kenalog 5 g Xylocaine and advised on reduced activity continued supportive shoe gear usage and dispensed night splint with all instructions on usage. May still require shockwave therapy depending on response

## 2015-05-25 ENCOUNTER — Ambulatory Visit (INDEPENDENT_AMBULATORY_CARE_PROVIDER_SITE_OTHER): Payer: Medicare Other | Admitting: Podiatry

## 2015-05-25 ENCOUNTER — Encounter: Payer: Self-pay | Admitting: Podiatry

## 2015-05-25 VITALS — BP 154/86 | HR 89 | Resp 12

## 2015-05-25 DIAGNOSIS — M722 Plantar fascial fibromatosis: Secondary | ICD-10-CM

## 2015-05-25 NOTE — Progress Notes (Signed)
Subjective:     Patient ID: Jodi Frank, female   DOB: Feb 23, 1941, 74 y.o.   MRN: [id number]  HPI patient presents stating she would like to have shockwave done as her pain continues to persist in her heel and is not improving   Review of Systems     Objective:   Physical Exam Neurovascular status intact muscle strength adequate with significant pain in the left plantar fascia at the insertion to calcaneus of both the medial and lateral side of the heel bone    Assessment:     Continue plantar fasciitis left    Plan:     Educated patient on shockwave and patient wants procedure understanding risk. Today I did 3000 shocks at 2.7 intensity 15 frequency and was tolerated well. Reappoint in 10 days for second treatment and did dispense air fracture walker today with all instructions on usage

## 2015-06-04 ENCOUNTER — Encounter: Payer: Self-pay | Admitting: Podiatry

## 2015-06-04 ENCOUNTER — Ambulatory Visit (INDEPENDENT_AMBULATORY_CARE_PROVIDER_SITE_OTHER): Payer: Medicare Other | Admitting: Podiatry

## 2015-06-04 DIAGNOSIS — M722 Plantar fascial fibromatosis: Secondary | ICD-10-CM

## 2015-06-04 NOTE — Progress Notes (Signed)
Subjective:     Patient ID: Jodi Frank, female   DOB: [birth year], 75 y.o.   MRN: [id number]  HPI patient presents with improve pain left heel   Review of Systems     Objective:   Physical Exam  neurovascular status intact with diminished discomfort plantar aspect left heel    Assessment:      improved from plantar fasciitis left    Plan:      shockwave administered tolerated well and reappoint to recheck 3.23 thousand shocks 17 frequency

## 2015-06-08 ENCOUNTER — Ambulatory Visit: Payer: Medicare Other | Admitting: Podiatry

## 2015-06-08 ENCOUNTER — Ambulatory Visit (INDEPENDENT_AMBULATORY_CARE_PROVIDER_SITE_OTHER): Payer: Medicare Other | Admitting: Podiatry

## 2015-06-08 DIAGNOSIS — M722 Plantar fascial fibromatosis: Secondary | ICD-10-CM

## 2015-06-08 DIAGNOSIS — M79673 Pain in unspecified foot: Secondary | ICD-10-CM

## 2015-06-10 NOTE — Progress Notes (Signed)
Subjective:     Patient ID: Jodi Frank, female   DOB: 06/19/1940, 75 y.o.   MRN: [id number]  HPI patient states the inside of my right heel is improved but I'm still getting discomfort on the outside of the heel   Review of Systems     Objective:   Physical Exam Neurovascular status intact with pain in the right medial heel doing well but quite a bit of discomfort still noted laterally    Assessment:     Plantar fasciitis improving with shockwave medial with pain lateral    Plan:     Reviewed condition and have recommended shockwave therapy again which was accomplished to 4.2 in intensity and reappoint 4 weeks and continue boot usage

## 2015-06-11 ENCOUNTER — Other Ambulatory Visit: Payer: Medicare Other

## 2015-06-15 DIAGNOSIS — Z952 Presence of prosthetic heart valve: Secondary | ICD-10-CM | POA: Diagnosis not present

## 2015-06-15 DIAGNOSIS — I701 Atherosclerosis of renal artery: Secondary | ICD-10-CM | POA: Diagnosis not present

## 2015-06-15 DIAGNOSIS — I10 Essential (primary) hypertension: Secondary | ICD-10-CM | POA: Diagnosis not present

## 2015-06-15 DIAGNOSIS — Z7901 Long term (current) use of anticoagulants: Secondary | ICD-10-CM | POA: Diagnosis not present

## 2015-06-15 DIAGNOSIS — G3184 Mild cognitive impairment, so stated: Secondary | ICD-10-CM | POA: Diagnosis not present

## 2015-07-06 ENCOUNTER — Ambulatory Visit (INDEPENDENT_AMBULATORY_CARE_PROVIDER_SITE_OTHER): Payer: Medicare Other | Admitting: Podiatry

## 2015-07-06 DIAGNOSIS — M79673 Pain in unspecified foot: Secondary | ICD-10-CM

## 2015-07-06 DIAGNOSIS — M779 Enthesopathy, unspecified: Secondary | ICD-10-CM

## 2015-07-06 DIAGNOSIS — M722 Plantar fascial fibromatosis: Secondary | ICD-10-CM

## 2015-07-06 MED ORDER — TRIAMCINOLONE ACETONIDE 10 MG/ML IJ SUSP
10.0000 mg | Freq: Once | INTRAMUSCULAR | Status: AC
  Administered 2015-07-06: 10 mg

## 2015-07-09 NOTE — Progress Notes (Signed)
Subjective:     Patient ID: Jodi Frank, female   DOB: [birth year], 75 y.o.   MRN: [id number]  HPI patient states that the bottom of her heel is doing pretty well but she's getting quite a bit of discomfort on the side   Review of Systems     Objective:   Physical Exam Neurovascular status intact with significant diminishment of discomfort plantar aspect left heel with discomfort around the peroneal insertion left that's quite inflamed when palpated    Assessment:     Doing well with plantar fasciitis left with tendinitis of the peroneal tendon left    Plan:     Careful lateral injection administered 3 mg dexamethasone Kenalog 5 mg Xylocaine and shockwave administered to plantar heel along with continued immobilization ice therapy. Patient will be seen back as needed if symptoms were to persist or get worse

## 2015-07-13 DIAGNOSIS — Z7901 Long term (current) use of anticoagulants: Secondary | ICD-10-CM | POA: Diagnosis not present

## 2015-07-13 DIAGNOSIS — Z952 Presence of prosthetic heart valve: Secondary | ICD-10-CM | POA: Diagnosis not present

## 2015-07-24 ENCOUNTER — Encounter: Payer: Self-pay | Admitting: Gastroenterology

## 2015-07-27 DIAGNOSIS — Z7901 Long term (current) use of anticoagulants: Secondary | ICD-10-CM | POA: Diagnosis not present

## 2015-07-27 DIAGNOSIS — Z952 Presence of prosthetic heart valve: Secondary | ICD-10-CM | POA: Diagnosis not present

## 2015-07-30 DIAGNOSIS — R42 Dizziness and giddiness: Secondary | ICD-10-CM | POA: Diagnosis not present

## 2015-07-30 DIAGNOSIS — I701 Atherosclerosis of renal artery: Secondary | ICD-10-CM | POA: Diagnosis not present

## 2015-07-30 DIAGNOSIS — I10 Essential (primary) hypertension: Secondary | ICD-10-CM | POA: Diagnosis not present

## 2015-08-08 ENCOUNTER — Ambulatory Visit: Payer: Medicare Other | Admitting: Podiatry

## 2015-08-09 DIAGNOSIS — Z7901 Long term (current) use of anticoagulants: Secondary | ICD-10-CM | POA: Diagnosis not present

## 2015-08-09 DIAGNOSIS — Z952 Presence of prosthetic heart valve: Secondary | ICD-10-CM | POA: Diagnosis not present

## 2015-09-06 DIAGNOSIS — Z952 Presence of prosthetic heart valve: Secondary | ICD-10-CM | POA: Diagnosis not present

## 2015-09-06 DIAGNOSIS — Z7901 Long term (current) use of anticoagulants: Secondary | ICD-10-CM | POA: Diagnosis not present

## 2015-09-12 DIAGNOSIS — H04123 Dry eye syndrome of bilateral lacrimal glands: Secondary | ICD-10-CM | POA: Diagnosis not present

## 2015-09-12 DIAGNOSIS — L718 Other rosacea: Secondary | ICD-10-CM | POA: Diagnosis not present

## 2015-09-12 DIAGNOSIS — H01003 Unspecified blepharitis right eye, unspecified eyelid: Secondary | ICD-10-CM | POA: Diagnosis not present

## 2015-09-14 DIAGNOSIS — I10 Essential (primary) hypertension: Secondary | ICD-10-CM | POA: Diagnosis not present

## 2015-09-14 DIAGNOSIS — M810 Age-related osteoporosis without current pathological fracture: Secondary | ICD-10-CM | POA: Diagnosis not present

## 2015-09-14 DIAGNOSIS — I701 Atherosclerosis of renal artery: Secondary | ICD-10-CM | POA: Diagnosis not present

## 2015-09-14 DIAGNOSIS — Z7901 Long term (current) use of anticoagulants: Secondary | ICD-10-CM | POA: Diagnosis not present

## 2015-09-14 DIAGNOSIS — E782 Mixed hyperlipidemia: Secondary | ICD-10-CM | POA: Diagnosis not present

## 2015-09-14 DIAGNOSIS — R42 Dizziness and giddiness: Secondary | ICD-10-CM | POA: Diagnosis not present

## 2015-09-14 DIAGNOSIS — Z952 Presence of prosthetic heart valve: Secondary | ICD-10-CM | POA: Diagnosis not present

## 2015-09-14 DIAGNOSIS — G3184 Mild cognitive impairment, so stated: Secondary | ICD-10-CM | POA: Diagnosis not present

## 2015-09-24 ENCOUNTER — Ambulatory Visit (INDEPENDENT_AMBULATORY_CARE_PROVIDER_SITE_OTHER): Payer: Medicare Other

## 2015-09-24 ENCOUNTER — Ambulatory Visit (INDEPENDENT_AMBULATORY_CARE_PROVIDER_SITE_OTHER): Payer: Medicare Other | Admitting: Podiatry

## 2015-09-24 ENCOUNTER — Encounter: Payer: Self-pay | Admitting: Podiatry

## 2015-09-24 DIAGNOSIS — M722 Plantar fascial fibromatosis: Secondary | ICD-10-CM | POA: Diagnosis not present

## 2015-09-24 DIAGNOSIS — M8430XA Stress fracture, unspecified site, initial encounter for fracture: Secondary | ICD-10-CM

## 2015-09-24 MED ORDER — HYDROCODONE-ACETAMINOPHEN 10-325 MG PO TABS: 1.0000 | ORAL_TABLET | Freq: Three times a day (TID) | ORAL | Status: DC | PRN

## 2015-09-27 NOTE — Progress Notes (Signed)
Subjective:     Patient ID: Jodi Frank, female   DOB: Dec 24, 1940, 75 y.o.   MRN: [id number]  HPI patient step down on her left heel and stated it started to hurt her severely and it feels almost like it's cracked   Review of Systems     Objective:   Physical Exam Neurovascular status intact with severe discomfort within the actual heel bone itself left mild pain plantar but that has been doing much better with previous shockwave therapy    Assessment:     Probability for stress fracture of the left heel    Plan:     H&P and condition reviewed with patient. Dispensed air fracture walker today with instructions on usage and utilization of ice therapy. Reappoint 3 weeks or earlier if needed  X-ray difficult to determine what problem and has but most likely fracture given the clinical presentation

## 2015-10-08 DIAGNOSIS — H01003 Unspecified blepharitis right eye, unspecified eyelid: Secondary | ICD-10-CM | POA: Diagnosis not present

## 2015-10-08 DIAGNOSIS — L718 Other rosacea: Secondary | ICD-10-CM | POA: Diagnosis not present

## 2015-10-08 DIAGNOSIS — H04123 Dry eye syndrome of bilateral lacrimal glands: Secondary | ICD-10-CM | POA: Diagnosis not present

## 2015-10-15 ENCOUNTER — Ambulatory Visit (INDEPENDENT_AMBULATORY_CARE_PROVIDER_SITE_OTHER): Payer: Medicare Other | Admitting: Podiatry

## 2015-10-15 ENCOUNTER — Encounter: Payer: Self-pay | Admitting: Podiatry

## 2015-10-15 ENCOUNTER — Ambulatory Visit (INDEPENDENT_AMBULATORY_CARE_PROVIDER_SITE_OTHER): Payer: Medicare Other

## 2015-10-15 DIAGNOSIS — M779 Enthesopathy, unspecified: Secondary | ICD-10-CM | POA: Diagnosis not present

## 2015-10-15 DIAGNOSIS — M8438XD Stress fracture, other site, subsequent encounter for fracture with routine healing: Secondary | ICD-10-CM | POA: Diagnosis not present

## 2015-10-15 MED ORDER — TRIAMCINOLONE ACETONIDE 10 MG/ML IJ SUSP
10.0000 mg | Freq: Once | INTRAMUSCULAR | Status: AC
  Administered 2015-10-15: 10 mg

## 2015-10-16 NOTE — Progress Notes (Signed)
Subjective:     Patient ID: Jodi Frank, female   DOB: 11-03-1940, 75 y.o.   MRN: [id number]  HPI patient presents stating that my heel is feeling some better but the outside of the foot has started to hurt quite a bit and I'm having trouble wearing my boot   Review of Systems     Objective:   Physical Exam Neurovascular status intact with exquisite discomfort in the calcaneus itself with edema still present. It has improved quite a bit and the pain is no longer as intense as it was but it still is quite inflamed when pressed. Patient is also noted to have peroneal tendonitis at the insertion base of fifth metatarsal left which is probably compensatory in nature    Assessment:     Gradual improvement of the calcaneus with probable stress fracture and peroneal tendinitis left    Plan:     H&P conditions reviewed and careful injection of the peroneal insertion left 3 mg Kenalog 5 mg Xylocaine administered. Gave instructions on physical therapy ice therapy and reappoint to recheck

## 2015-10-23 DIAGNOSIS — Z7901 Long term (current) use of anticoagulants: Secondary | ICD-10-CM | POA: Diagnosis not present

## 2015-10-23 DIAGNOSIS — Z952 Presence of prosthetic heart valve: Secondary | ICD-10-CM | POA: Diagnosis not present

## 2015-10-30 DIAGNOSIS — Z7901 Long term (current) use of anticoagulants: Secondary | ICD-10-CM | POA: Diagnosis not present

## 2015-10-30 DIAGNOSIS — Z952 Presence of prosthetic heart valve: Secondary | ICD-10-CM | POA: Diagnosis not present

## 2015-11-13 DIAGNOSIS — Z952 Presence of prosthetic heart valve: Secondary | ICD-10-CM | POA: Diagnosis not present

## 2015-11-13 DIAGNOSIS — Z7901 Long term (current) use of anticoagulants: Secondary | ICD-10-CM | POA: Diagnosis not present

## 2015-12-03 ENCOUNTER — Ambulatory Visit (INDEPENDENT_AMBULATORY_CARE_PROVIDER_SITE_OTHER): Payer: Medicare Other | Admitting: Podiatry

## 2015-12-03 ENCOUNTER — Encounter: Payer: Self-pay | Admitting: Podiatry

## 2015-12-03 ENCOUNTER — Ambulatory Visit (INDEPENDENT_AMBULATORY_CARE_PROVIDER_SITE_OTHER): Payer: Medicare Other

## 2015-12-03 VITALS — BP 155/72 | HR 82 | Resp 12

## 2015-12-03 DIAGNOSIS — M779 Enthesopathy, unspecified: Secondary | ICD-10-CM | POA: Diagnosis not present

## 2015-12-03 DIAGNOSIS — M8430XA Stress fracture, unspecified site, initial encounter for fracture: Secondary | ICD-10-CM

## 2015-12-04 NOTE — Progress Notes (Signed)
Subjective:     Patient ID: Jodi Frank, female   DOB: 08-07-1940, 75 y.o.   MRN: [id number]  HPI patient states my left foot is doing a lot better with occasional discomfort   Review of Systems     Objective:   Physical Exam Neurovascular status intact with patient having significant diminishment of discomfort in the calcaneus itself and the lateral side of the left foot which is improved and the peroneal group    Assessment:     Improved pain with probable stress fracture and inflammatory lateral pain    Plan:     Advised on physical therapy anti-inflammatories ice therapy and supportive shoe and will be seen as needed

## 2015-12-06 DIAGNOSIS — I10 Essential (primary) hypertension: Secondary | ICD-10-CM | POA: Diagnosis not present

## 2015-12-06 DIAGNOSIS — Z7901 Long term (current) use of anticoagulants: Secondary | ICD-10-CM | POA: Diagnosis not present

## 2015-12-06 DIAGNOSIS — Z952 Presence of prosthetic heart valve: Secondary | ICD-10-CM | POA: Diagnosis not present

## 2015-12-06 DIAGNOSIS — I701 Atherosclerosis of renal artery: Secondary | ICD-10-CM | POA: Diagnosis not present

## 2015-12-06 DIAGNOSIS — R42 Dizziness and giddiness: Secondary | ICD-10-CM | POA: Diagnosis not present

## 2015-12-17 DIAGNOSIS — R410 Disorientation, unspecified: Secondary | ICD-10-CM | POA: Diagnosis not present

## 2015-12-17 DIAGNOSIS — I10 Essential (primary) hypertension: Secondary | ICD-10-CM | POA: Diagnosis not present

## 2015-12-17 DIAGNOSIS — R42 Dizziness and giddiness: Secondary | ICD-10-CM | POA: Diagnosis not present

## 2015-12-17 DIAGNOSIS — I701 Atherosclerosis of renal artery: Secondary | ICD-10-CM | POA: Diagnosis not present

## 2015-12-20 DIAGNOSIS — I10 Essential (primary) hypertension: Secondary | ICD-10-CM | POA: Diagnosis not present

## 2016-01-08 DIAGNOSIS — Z7901 Long term (current) use of anticoagulants: Secondary | ICD-10-CM | POA: Diagnosis not present

## 2016-01-08 DIAGNOSIS — Z952 Presence of prosthetic heart valve: Secondary | ICD-10-CM | POA: Diagnosis not present

## 2016-01-10 DIAGNOSIS — L718 Other rosacea: Secondary | ICD-10-CM | POA: Diagnosis not present

## 2016-01-10 DIAGNOSIS — H04123 Dry eye syndrome of bilateral lacrimal glands: Secondary | ICD-10-CM | POA: Diagnosis not present

## 2016-01-11 DIAGNOSIS — M810 Age-related osteoporosis without current pathological fracture: Secondary | ICD-10-CM | POA: Diagnosis not present

## 2016-01-17 DIAGNOSIS — F5101 Primary insomnia: Secondary | ICD-10-CM | POA: Diagnosis not present

## 2016-01-17 DIAGNOSIS — I10 Essential (primary) hypertension: Secondary | ICD-10-CM | POA: Diagnosis not present

## 2016-01-17 DIAGNOSIS — G3184 Mild cognitive impairment, so stated: Secondary | ICD-10-CM | POA: Diagnosis not present

## 2016-01-17 DIAGNOSIS — I701 Atherosclerosis of renal artery: Secondary | ICD-10-CM | POA: Diagnosis not present

## 2016-01-17 DIAGNOSIS — R42 Dizziness and giddiness: Secondary | ICD-10-CM | POA: Diagnosis not present

## 2016-01-18 ENCOUNTER — Ambulatory Visit (INDEPENDENT_AMBULATORY_CARE_PROVIDER_SITE_OTHER): Payer: Medicare Other | Admitting: Neurology

## 2016-01-18 ENCOUNTER — Encounter: Payer: Self-pay | Admitting: Neurology

## 2016-01-18 DIAGNOSIS — R42 Dizziness and giddiness: Secondary | ICD-10-CM

## 2016-01-18 DIAGNOSIS — R413 Other amnesia: Secondary | ICD-10-CM

## 2016-01-18 HISTORY — DX: Other amnesia: R41.3

## 2016-01-18 HISTORY — DX: Dizziness and giddiness: R42

## 2016-01-18 NOTE — Progress Notes (Signed)
Reason for visit: Dizziness, memory disorder  Referring physician: Dr. Delice Bison is a 75 y.o. female  History of present illness:  Ms. Defreece is a 75 year old right-handed white female with a history of at event of severe vertigo that occurred when she was around 75 years old. The patient required hospitalization for this, but she seemed to recover fairly well. Within the last 2 or 3 years, the patient has begun having a lightheaded sensation that will tend to occur later in the day. She generally does well in the morning, but by mid or late afternoon, she will note some sensation of being lightheaded or floaty headed when she is standing. If she sits down or lies down the symptoms improve. She has no problems operating a motor vehicle secondary to the symptoms. The patient denies any headache, or neck stiffness. She denies any difficulty with hearing, ringing in the ears, or ear pain. She does not have nausea or vomiting with the sensation of lightheadedness. She denies any episodes of fainting. She reports no numbness or weakness of the face, arms, or legs. She has had some adjustment of her blood pressure medications without improvement in her symptoms of dizziness. The patient has not had any falls. She is also sent over for some troubles with memory. The patient herself denies any issues with memory, she operates a motor vehicle without difficulty and she is able to keep up with medications and appointments. She does not keep up with the date often times. She has no problem remembering names of people or remembering recent events. She has not altered any of her activities of daily living because of memory. 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 She has a past history of migraines, she still gets visual aura of the headache without the headache itself. She does not correlate any of the dizziness with these events.  Past Medical History:    Diagnosis Date  . Broken wrist   . Depression   . Diarrhea   . High cholesterol   . Hypertension   . Memory difficulty 01/18/2016  . 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 Vertigo 01/18/2016    Past Surgical History:  Procedure Laterality Date  . HEMATOMA EVACUATION Left 11/25/2012   Procedure: EVACUATION HEMATOMA;  Surgeon: Linna Hoff, MD;  Location: [place];  Service: Orthopedics;  Laterality: Left;  . HIP PINNING,CANNULATED Left 11/03/2012   Procedure: CANNULATED HIP PINNING;  Surgeon: Tobi Bastos, MD;  Location: [facility];  Service: Orthopedics;  Laterality: Left;  . 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  . OPEN REDUCTION INTERNAL FIXATION (ORIF) WRIST WITH ILIAC CREST BONE GRAFT Left 11/24/2012   Procedure: OPEN REDUCTION INTERNAL FIXATION LEFT WRIST POSSIBLE BONE GRAFTING AND PINNING (ORIF) ;  Surgeon: Linna Hoff, MD;  Location: [place];  Service: Orthopedics;  Laterality: Left;  . SHOULDER OPEN ROTATOR CUFF REPAIR      Family History  Problem Relation Age of Onset  . Stroke Mother   . Hypertension Mother   . Stroke Father   . Colon cancer Paternal Grandmother     Social history:  reports that she has never smoked. She has never used smokeless tobacco. She reports that she drinks about 8.4 oz of alcohol per week . She reports that she does not use drugs.  Medications:  Prior to Admission medications   Medication Sig  Start Date End Date Taking? Authorizing Provider  calcium carbonate (OS-CAL) 600 MG TABS tablet Take by mouth.   Yes Historical Provider, MD  Carboxymethylcell-Hypromellose (GENTEAL) 0.25-0.3 % GEL Apply 1 application to eye every evening.   Yes Historical Provider, MD  cholecalciferol (VITAMIN D) 1000 UNITS tablet Take 1,000 Units by mouth daily.   Yes Historical Provider, MD  cloNIDine (CATAPRES - DOSED IN MG/24 HR) 0.1 mg/24hr patch Place onto the skin. 01/17/16 01/16/17 Yes Historical Provider, MD  doxycycline (VIBRAMYCIN) 50 MG capsule  Take by mouth.  02/10/16 Yes Historical Provider, MD  fish oil-omega-3 fatty acids 1000 MG capsule Take 2 g by mouth 2 (two) times daily.    Yes Historical Provider, MD  ibandronate (BONIVA) 150 MG tablet Take 150 once monthly in the morning with a full glass of water and do not take anything else by mouth or lie down for the next 30 min. 01/27/13  Yes Historical Provider, MD  losartan (COZAAR) 100 MG tablet Take by mouth. 12/24/15 12/23/16 Yes Historical Provider, MD  meclizine (ANTIVERT) 25 MG tablet Take 25 mg by mouth as needed for dizziness.   Yes Historical Provider, MD  Melatonin 1 MG TABS Take by mouth. 01/17/16  Yes Historical Provider, MD  Multiple Vitamin (MULTIVITAMIN) capsule Take by mouth.   Yes Historical Provider, MD  rosuvastatin (CRESTOR) 5 MG tablet Take 5 mg by mouth daily.   Yes Historical Provider, MD  warfarin (COUMADIN) 6 MG tablet Take by mouth. 11/13/15 11/12/16 Yes Historical Provider, MD      Allergies  Allergen Reactions  . Carvedilol Itching  . Ace Inhibitors Other (See Comments)  . Amlodipine Swelling  . Hydrochlorothiazide Other (See Comments)    hyponatremia    ROS:  Out of a complete 14 system review of symptoms, the patient complains only of the following symptoms, and all other reviewed systems are negative.  Weight gain Dry eyes Skin sensitivity Dizziness Insomnia  Blood pressure (!) 152/86, pulse 70, height 5\' 4"  (1.626 m), weight 156 lb (70.8 kg).   Blood pressure standing, right arm is 138/100.  Physical Exam  General: The patient is alert and cooperative at the time of the examination.  Eyes: Pupils are equal, round, and reactive to light. Discs are flat bilaterally.  Ears: Tympanic membranes are clear on the right, difficult to visualize on the left secondary to cerumen.  Neck: The neck is supple, no carotid bruits are noted.  Respiratory: The respiratory examination is clear.  Cardiovascular: The cardiovascular examination reveals a  regular rate and rhythm, there is a valvular click that is maximal in the left lower sternal border.  Skin: Extremities are without significant edema.  Neurologic Exam  Mental status: The patient is alert and oriented x 3 at the time of the examination. The patient has apparent normal recent and remote memory, with an apparently normal attention span and concentration ability. Mini-Mental Status Examination done today shows a total 29/30.  Cranial nerves: Facial symmetry is present. There is good sensation of the face to pinprick and soft touch bilaterally. The strength of the facial muscles and the muscles to head turning and shoulder shrug are normal bilaterally. Speech is well enunciated, no aphasia or dysarthria is noted. Extraocular movements are full. Visual fields are full. The tongue is midline, and the patient has symmetric elevation of the soft palate. No obvious hearing deficits are noted.  Motor: The motor testing reveals 5 over 5 strength of all 4 extremities. Good symmetric motor tone  is noted throughout.  Sensory: Sensory testing is intact to pinprick, soft touch, vibration sensation, and position sense on all 4 extremities. No evidence of extinction is noted.  Coordination: Cerebellar testing reveals good finger-nose-finger and heel-to-shin bilaterally.  Gait and station: Gait is normal. Tandem gait is normal. Romberg is negative. No drift is seen.  Reflexes: Deep tendon reflexes are symmetric and normal bilaterally. Toes are downgoing bilaterally.   Assessment/Plan:  1. Subjective dizziness  2. History of migraine  3. Possible memory disturbance  The patient denies any issues with memory at this time. We will need to follow this over time. The patient reports a lightheaded sensation, not true vertigo at this time. The dizziness is associated with standing, goes away with sitting. The patient is to check her blood pressure sitting and standing during a symptomatic time.  The patient will undergo a CT scan of the brain, and a carotid Doppler study. Clinical examination today otherwise was unremarkable. She will follow-up in 4 or 5 months.  Jill Alexanders MD 01/18/2016 10:55 AM  [facility] [address] [place], [place] [address]  Phone [phone] Fax [phone]

## 2016-01-21 ENCOUNTER — Telehealth: Payer: Self-pay | Admitting: Neurology

## 2016-01-21 NOTE — Telephone Encounter (Signed)
Patient is ready to be scheduled for Doppler. No PA needed. Thanks Dana .  °

## 2016-01-25 LAB — HM DEXA SCAN

## 2016-01-30 ENCOUNTER — Ambulatory Visit
Admission: RE | Admit: 2016-01-30 | Discharge: 2016-01-30 | Disposition: A | Discharge status: Home / Self Care | Payer: Medicare Other | Source: Ambulatory Visit | Attending: Neurology | Admitting: Neurology

## 2016-01-30 ENCOUNTER — Telehealth: Payer: Self-pay | Admitting: Neurology

## 2016-01-30 DIAGNOSIS — R413 Other amnesia: Secondary | ICD-10-CM

## 2016-01-30 DIAGNOSIS — R42 Dizziness and giddiness: Secondary | ICD-10-CM | POA: Diagnosis not present

## 2016-01-30 NOTE — Telephone Encounter (Signed)
I called the patient. The carotid Doppler study was unremarkable. I discussed this with the patient. CT the brain has been done, the results are pending.

## 2016-01-31 ENCOUNTER — Telehealth: Payer: Self-pay | Admitting: Neurology

## 2016-01-31 DIAGNOSIS — Z7901 Long term (current) use of anticoagulants: Secondary | ICD-10-CM | POA: Diagnosis not present

## 2016-01-31 DIAGNOSIS — Z952 Presence of prosthetic heart valve: Secondary | ICD-10-CM | POA: Diagnosis not present

## 2016-01-31 NOTE — Telephone Encounter (Signed)
   I called patient. CT the head shows no changes from 2014. Carotid Doppler study done previously was unremarkable.  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 No acute findings. No change from CT on 10/29/12.

## 2016-02-14 DIAGNOSIS — Z23 Encounter for immunization: Secondary | ICD-10-CM | POA: Diagnosis not present

## 2016-02-14 DIAGNOSIS — R42 Dizziness and giddiness: Secondary | ICD-10-CM | POA: Diagnosis not present

## 2016-02-14 DIAGNOSIS — M810 Age-related osteoporosis without current pathological fracture: Secondary | ICD-10-CM | POA: Diagnosis not present

## 2016-02-14 DIAGNOSIS — I701 Atherosclerosis of renal artery: Secondary | ICD-10-CM | POA: Diagnosis not present

## 2016-02-14 DIAGNOSIS — I10 Essential (primary) hypertension: Secondary | ICD-10-CM | POA: Diagnosis not present

## 2016-02-20 ENCOUNTER — Ambulatory Visit (INDEPENDENT_AMBULATORY_CARE_PROVIDER_SITE_OTHER): Payer: Medicare Other

## 2016-02-20 ENCOUNTER — Encounter: Payer: Self-pay | Admitting: Podiatry

## 2016-02-20 ENCOUNTER — Ambulatory Visit (INDEPENDENT_AMBULATORY_CARE_PROVIDER_SITE_OTHER): Payer: Medicare Other | Admitting: Podiatry

## 2016-02-20 DIAGNOSIS — M8430XA Stress fracture, unspecified site, initial encounter for fracture: Secondary | ICD-10-CM | POA: Diagnosis not present

## 2016-02-20 DIAGNOSIS — M79672 Pain in left foot: Secondary | ICD-10-CM

## 2016-02-20 DIAGNOSIS — M779 Enthesopathy, unspecified: Secondary | ICD-10-CM | POA: Diagnosis not present

## 2016-02-20 MED ORDER — TRIAMCINOLONE ACETONIDE 10 MG/ML IJ SUSP
10.0000 mg | Freq: Once | INTRAMUSCULAR | Status: AC
Start: 2016-02-20 — End: 2016-02-20
  Administered 2016-02-20: 10 mg

## 2016-02-21 DIAGNOSIS — Z952 Presence of prosthetic heart valve: Secondary | ICD-10-CM | POA: Diagnosis not present

## 2016-02-21 DIAGNOSIS — Z7901 Long term (current) use of anticoagulants: Secondary | ICD-10-CM | POA: Diagnosis not present

## 2016-02-21 NOTE — Progress Notes (Signed)
Subjective:     Patient ID: Jodi Frank, female   DOB: 09-21-1940, 75 y.o.   MRN: [id number]  HPI patient states I'm still getting pain in the outside of the left foot but it does not feel like a fracture it just feels more like the tendon irritation   Review of Systems     Objective:   Physical Exam Neurovascular status intact with quite a bit of discomfort in the tendon complex left at the peroneal insertion into the base of the fifth metatarsal    Assessment:     Tendinitis left with inflammation fluid    Plan:     H&P condition reviewed careful sheath injection administered after reviewing x-ray. Explain ice therapy and gradual reduction of boot usage and increased usage of shoe  X-ray report indicated that there is no indications of acute fracture lateral side left foot with no ostial lysis or other bony condition that appears to be occurring

## 2016-03-06 DIAGNOSIS — Z7901 Long term (current) use of anticoagulants: Secondary | ICD-10-CM | POA: Diagnosis not present

## 2016-03-06 DIAGNOSIS — Z952 Presence of prosthetic heart valve: Secondary | ICD-10-CM | POA: Diagnosis not present

## 2016-03-14 DIAGNOSIS — Z0000 Encounter for general adult medical examination without abnormal findings: Secondary | ICD-10-CM | POA: Diagnosis not present

## 2016-03-14 DIAGNOSIS — E871 Hypo-osmolality and hyponatremia: Secondary | ICD-10-CM | POA: Diagnosis not present

## 2016-03-14 DIAGNOSIS — I10 Essential (primary) hypertension: Secondary | ICD-10-CM | POA: Diagnosis not present

## 2016-03-14 DIAGNOSIS — G3184 Mild cognitive impairment, so stated: Secondary | ICD-10-CM | POA: Diagnosis not present

## 2016-03-14 DIAGNOSIS — M810 Age-related osteoporosis without current pathological fracture: Secondary | ICD-10-CM | POA: Diagnosis not present

## 2016-03-20 DIAGNOSIS — Z952 Presence of prosthetic heart valve: Secondary | ICD-10-CM | POA: Diagnosis not present

## 2016-03-20 DIAGNOSIS — Z7901 Long term (current) use of anticoagulants: Secondary | ICD-10-CM | POA: Diagnosis not present

## 2016-04-02 DIAGNOSIS — Z1231 Encounter for screening mammogram for malignant neoplasm of breast: Secondary | ICD-10-CM | POA: Diagnosis not present

## 2016-04-03 DIAGNOSIS — Z952 Presence of prosthetic heart valve: Secondary | ICD-10-CM | POA: Diagnosis not present

## 2016-04-03 DIAGNOSIS — Z7901 Long term (current) use of anticoagulants: Secondary | ICD-10-CM | POA: Diagnosis not present

## 2016-04-23 DIAGNOSIS — Z952 Presence of prosthetic heart valve: Secondary | ICD-10-CM | POA: Diagnosis not present

## 2016-04-23 DIAGNOSIS — Z7901 Long term (current) use of anticoagulants: Secondary | ICD-10-CM | POA: Diagnosis not present

## 2016-04-24 DIAGNOSIS — E871 Hypo-osmolality and hyponatremia: Secondary | ICD-10-CM | POA: Diagnosis not present

## 2016-04-24 DIAGNOSIS — I10 Essential (primary) hypertension: Secondary | ICD-10-CM | POA: Diagnosis not present

## 2016-04-24 DIAGNOSIS — R42 Dizziness and giddiness: Secondary | ICD-10-CM | POA: Diagnosis not present

## 2016-04-30 DIAGNOSIS — E871 Hypo-osmolality and hyponatremia: Secondary | ICD-10-CM | POA: Diagnosis not present

## 2016-04-30 DIAGNOSIS — I10 Essential (primary) hypertension: Secondary | ICD-10-CM | POA: Diagnosis not present

## 2016-05-28 ENCOUNTER — Ambulatory Visit: Payer: Medicare Other | Admitting: Neurology

## 2016-05-29 DIAGNOSIS — I10 Essential (primary) hypertension: Secondary | ICD-10-CM | POA: Diagnosis not present

## 2016-05-29 DIAGNOSIS — E222 Syndrome of inappropriate secretion of antidiuretic hormone: Secondary | ICD-10-CM | POA: Diagnosis not present

## 2016-05-29 DIAGNOSIS — R42 Dizziness and giddiness: Secondary | ICD-10-CM | POA: Diagnosis not present

## 2016-05-29 DIAGNOSIS — Z952 Presence of prosthetic heart valve: Secondary | ICD-10-CM | POA: Diagnosis not present

## 2016-05-29 DIAGNOSIS — Z7901 Long term (current) use of anticoagulants: Secondary | ICD-10-CM | POA: Diagnosis not present

## 2016-06-05 DIAGNOSIS — Z952 Presence of prosthetic heart valve: Secondary | ICD-10-CM | POA: Diagnosis not present

## 2016-06-05 DIAGNOSIS — Z7901 Long term (current) use of anticoagulants: Secondary | ICD-10-CM | POA: Diagnosis not present

## 2016-06-19 DIAGNOSIS — Z952 Presence of prosthetic heart valve: Secondary | ICD-10-CM | POA: Diagnosis not present

## 2016-06-19 DIAGNOSIS — Z7901 Long term (current) use of anticoagulants: Secondary | ICD-10-CM | POA: Diagnosis not present

## 2016-06-25 DIAGNOSIS — E222 Syndrome of inappropriate secretion of antidiuretic hormone: Secondary | ICD-10-CM | POA: Diagnosis not present

## 2016-06-25 LAB — BASIC METABOLIC PANEL
BUN: 10 (ref 4–21)
CREATININE: 0.7 (ref 0.5–1.1)
Potassium: 5.2 (ref 3.4–5.3)
Sodium: 133 — AB (ref 137–147)

## 2016-07-03 DIAGNOSIS — Z789 Other specified health status: Secondary | ICD-10-CM | POA: Diagnosis not present

## 2016-07-03 DIAGNOSIS — I4891 Unspecified atrial fibrillation: Secondary | ICD-10-CM | POA: Diagnosis not present

## 2016-07-03 DIAGNOSIS — R42 Dizziness and giddiness: Secondary | ICD-10-CM | POA: Diagnosis not present

## 2016-07-03 DIAGNOSIS — G3184 Mild cognitive impairment, so stated: Secondary | ICD-10-CM | POA: Diagnosis not present

## 2016-07-03 DIAGNOSIS — Z952 Presence of prosthetic heart valve: Secondary | ICD-10-CM | POA: Diagnosis not present

## 2016-07-03 DIAGNOSIS — E222 Syndrome of inappropriate secretion of antidiuretic hormone: Secondary | ICD-10-CM | POA: Diagnosis not present

## 2016-07-03 DIAGNOSIS — I10 Essential (primary) hypertension: Secondary | ICD-10-CM | POA: Diagnosis not present

## 2016-07-17 DIAGNOSIS — Z952 Presence of prosthetic heart valve: Secondary | ICD-10-CM | POA: Diagnosis not present

## 2016-07-17 DIAGNOSIS — I10 Essential (primary) hypertension: Secondary | ICD-10-CM | POA: Diagnosis not present

## 2016-07-17 DIAGNOSIS — Z7901 Long term (current) use of anticoagulants: Secondary | ICD-10-CM | POA: Diagnosis not present

## 2016-07-29 DIAGNOSIS — I10 Essential (primary) hypertension: Secondary | ICD-10-CM | POA: Diagnosis not present

## 2016-07-29 DIAGNOSIS — Z952 Presence of prosthetic heart valve: Secondary | ICD-10-CM | POA: Diagnosis not present

## 2016-07-29 DIAGNOSIS — I4891 Unspecified atrial fibrillation: Secondary | ICD-10-CM | POA: Diagnosis not present

## 2016-08-14 DIAGNOSIS — Z7901 Long term (current) use of anticoagulants: Secondary | ICD-10-CM | POA: Diagnosis not present

## 2016-08-14 DIAGNOSIS — Z952 Presence of prosthetic heart valve: Secondary | ICD-10-CM | POA: Diagnosis not present

## 2016-08-21 DIAGNOSIS — E222 Syndrome of inappropriate secretion of antidiuretic hormone: Secondary | ICD-10-CM | POA: Diagnosis not present

## 2016-08-26 DIAGNOSIS — Z7901 Long term (current) use of anticoagulants: Secondary | ICD-10-CM | POA: Diagnosis not present

## 2016-08-26 DIAGNOSIS — Z952 Presence of prosthetic heart valve: Secondary | ICD-10-CM | POA: Diagnosis not present

## 2016-08-28 DIAGNOSIS — R42 Dizziness and giddiness: Secondary | ICD-10-CM | POA: Diagnosis not present

## 2016-08-28 DIAGNOSIS — I10 Essential (primary) hypertension: Secondary | ICD-10-CM | POA: Diagnosis not present

## 2016-08-28 DIAGNOSIS — E222 Syndrome of inappropriate secretion of antidiuretic hormone: Secondary | ICD-10-CM | POA: Diagnosis not present

## 2016-09-05 DIAGNOSIS — Z952 Presence of prosthetic heart valve: Secondary | ICD-10-CM | POA: Diagnosis not present

## 2016-09-05 DIAGNOSIS — I517 Cardiomegaly: Secondary | ICD-10-CM | POA: Diagnosis not present

## 2016-09-05 DIAGNOSIS — I088 Other rheumatic multiple valve diseases: Secondary | ICD-10-CM | POA: Diagnosis not present

## 2016-09-05 DIAGNOSIS — I700 Atherosclerosis of aorta: Secondary | ICD-10-CM | POA: Diagnosis not present

## 2016-09-16 DIAGNOSIS — E782 Mixed hyperlipidemia: Secondary | ICD-10-CM | POA: Diagnosis not present

## 2016-09-16 DIAGNOSIS — Z952 Presence of prosthetic heart valve: Secondary | ICD-10-CM | POA: Diagnosis not present

## 2016-09-16 DIAGNOSIS — I4891 Unspecified atrial fibrillation: Secondary | ICD-10-CM | POA: Diagnosis not present

## 2016-09-16 DIAGNOSIS — I10 Essential (primary) hypertension: Secondary | ICD-10-CM | POA: Diagnosis not present

## 2016-09-17 DIAGNOSIS — R42 Dizziness and giddiness: Secondary | ICD-10-CM | POA: Diagnosis not present

## 2016-09-17 DIAGNOSIS — G3184 Mild cognitive impairment, so stated: Secondary | ICD-10-CM | POA: Diagnosis not present

## 2016-09-17 DIAGNOSIS — Z952 Presence of prosthetic heart valve: Secondary | ICD-10-CM | POA: Diagnosis not present

## 2016-09-17 DIAGNOSIS — Z7901 Long term (current) use of anticoagulants: Secondary | ICD-10-CM | POA: Diagnosis not present

## 2016-09-17 DIAGNOSIS — E782 Mixed hyperlipidemia: Secondary | ICD-10-CM | POA: Diagnosis not present

## 2016-09-17 DIAGNOSIS — E222 Syndrome of inappropriate secretion of antidiuretic hormone: Secondary | ICD-10-CM | POA: Diagnosis not present

## 2016-09-17 DIAGNOSIS — I10 Essential (primary) hypertension: Secondary | ICD-10-CM | POA: Diagnosis not present

## 2016-09-17 DIAGNOSIS — I482 Chronic atrial fibrillation: Secondary | ICD-10-CM | POA: Diagnosis not present

## 2016-10-15 DIAGNOSIS — Z952 Presence of prosthetic heart valve: Secondary | ICD-10-CM | POA: Diagnosis not present

## 2016-10-15 DIAGNOSIS — Z7901 Long term (current) use of anticoagulants: Secondary | ICD-10-CM | POA: Diagnosis not present

## 2016-11-12 DIAGNOSIS — Z7901 Long term (current) use of anticoagulants: Secondary | ICD-10-CM | POA: Diagnosis not present

## 2016-11-12 DIAGNOSIS — Z952 Presence of prosthetic heart valve: Secondary | ICD-10-CM | POA: Diagnosis not present

## 2016-11-18 DIAGNOSIS — I482 Chronic atrial fibrillation: Secondary | ICD-10-CM | POA: Diagnosis not present

## 2016-11-18 DIAGNOSIS — I10 Essential (primary) hypertension: Secondary | ICD-10-CM | POA: Diagnosis not present

## 2016-11-18 DIAGNOSIS — Z952 Presence of prosthetic heart valve: Secondary | ICD-10-CM | POA: Diagnosis not present

## 2016-12-10 DIAGNOSIS — Z952 Presence of prosthetic heart valve: Secondary | ICD-10-CM | POA: Diagnosis not present

## 2016-12-10 DIAGNOSIS — Z7901 Long term (current) use of anticoagulants: Secondary | ICD-10-CM | POA: Diagnosis not present

## 2016-12-18 DIAGNOSIS — Z7901 Long term (current) use of anticoagulants: Secondary | ICD-10-CM | POA: Diagnosis not present

## 2016-12-18 DIAGNOSIS — Z952 Presence of prosthetic heart valve: Secondary | ICD-10-CM | POA: Diagnosis not present

## 2016-12-25 DIAGNOSIS — Z7901 Long term (current) use of anticoagulants: Secondary | ICD-10-CM | POA: Diagnosis not present

## 2016-12-25 DIAGNOSIS — Z952 Presence of prosthetic heart valve: Secondary | ICD-10-CM | POA: Diagnosis not present

## 2017-01-01 DIAGNOSIS — Z952 Presence of prosthetic heart valve: Secondary | ICD-10-CM | POA: Diagnosis not present

## 2017-01-01 DIAGNOSIS — Z7901 Long term (current) use of anticoagulants: Secondary | ICD-10-CM | POA: Diagnosis not present

## 2017-01-29 DIAGNOSIS — Z7901 Long term (current) use of anticoagulants: Secondary | ICD-10-CM | POA: Diagnosis not present

## 2017-01-29 DIAGNOSIS — Z952 Presence of prosthetic heart valve: Secondary | ICD-10-CM | POA: Diagnosis not present

## 2017-02-26 DIAGNOSIS — M810 Age-related osteoporosis without current pathological fracture: Secondary | ICD-10-CM | POA: Diagnosis not present

## 2017-02-26 DIAGNOSIS — E783 Hyperchylomicronemia: Secondary | ICD-10-CM | POA: Diagnosis not present

## 2017-02-26 DIAGNOSIS — I10 Essential (primary) hypertension: Secondary | ICD-10-CM | POA: Diagnosis not present

## 2017-02-26 DIAGNOSIS — Z23 Encounter for immunization: Secondary | ICD-10-CM | POA: Diagnosis not present

## 2017-02-26 DIAGNOSIS — Z7901 Long term (current) use of anticoagulants: Secondary | ICD-10-CM | POA: Diagnosis not present

## 2017-02-26 DIAGNOSIS — Z952 Presence of prosthetic heart valve: Secondary | ICD-10-CM | POA: Diagnosis not present

## 2017-02-27 LAB — HEPATIC FUNCTION PANEL
ALT: 26 (ref 7–35)
AST: 24 (ref 13–35)
Alkaline Phosphatase: 59 (ref 25–125)
BILIRUBIN, TOTAL: 1.3

## 2017-02-27 LAB — TSH: TSH: 0.62 (ref 0.41–5.90)

## 2017-02-27 LAB — BASIC METABOLIC PANEL
BUN: 12 (ref 4–21)
Creatinine: 0.7 (ref 0.5–1.1)
GLUCOSE: 103
Potassium: 4.8 (ref 3.4–5.3)
SODIUM: 130 — AB (ref 137–147)

## 2017-02-27 LAB — LIPID PANEL
Cholesterol: 192 (ref 0–200)
HDL: 84 — AB (ref 35–70)
LDL CALC: 96
Triglycerides: 62 (ref 40–160)

## 2017-02-27 LAB — POCT INR: INR: 2.3 — AB (ref 0.9–1.1)

## 2017-02-27 LAB — CBC AND DIFFERENTIAL
HCT: 44 (ref 36–46)
Hemoglobin: 14.3 (ref 12.0–16.0)
PLATELETS: 340 (ref 150–399)
WBC: 6.5

## 2017-02-27 LAB — PROTIME-INR: Protime: 24.3 — AB (ref 10.0–13.8)

## 2017-03-06 DIAGNOSIS — Z952 Presence of prosthetic heart valve: Secondary | ICD-10-CM | POA: Diagnosis not present

## 2017-03-06 DIAGNOSIS — M810 Age-related osteoporosis without current pathological fracture: Secondary | ICD-10-CM | POA: Diagnosis not present

## 2017-03-06 DIAGNOSIS — Z7901 Long term (current) use of anticoagulants: Secondary | ICD-10-CM | POA: Diagnosis not present

## 2017-03-20 DIAGNOSIS — Z952 Presence of prosthetic heart valve: Secondary | ICD-10-CM | POA: Diagnosis not present

## 2017-03-20 DIAGNOSIS — Z7901 Long term (current) use of anticoagulants: Secondary | ICD-10-CM | POA: Diagnosis not present

## 2017-04-14 DIAGNOSIS — Z7901 Long term (current) use of anticoagulants: Secondary | ICD-10-CM | POA: Diagnosis not present

## 2017-04-14 DIAGNOSIS — Z952 Presence of prosthetic heart valve: Secondary | ICD-10-CM | POA: Diagnosis not present

## 2017-04-14 LAB — POCT INR: INR: 4.4 — AB (ref 0.9–1.1)

## 2017-04-24 DIAGNOSIS — Z7901 Long term (current) use of anticoagulants: Secondary | ICD-10-CM | POA: Diagnosis not present

## 2017-04-24 DIAGNOSIS — Z952 Presence of prosthetic heart valve: Secondary | ICD-10-CM | POA: Diagnosis not present

## 2017-04-24 LAB — POCT INR: INR: 3.4 — AB (ref 0.9–1.1)

## 2017-05-04 ENCOUNTER — Encounter (HOSPITAL_COMMUNITY): Payer: Self-pay | Admitting: *Deleted

## 2017-05-04 ENCOUNTER — Emergency Department (HOSPITAL_COMMUNITY): Payer: Medicare Other

## 2017-05-04 ENCOUNTER — Inpatient Hospital Stay (HOSPITAL_COMMUNITY)
Admission: EM | Admit: 2017-05-04 | Discharge: 2017-05-09 | DRG: 092 | Disposition: A | Discharge status: Skilled Nursing Facility | Payer: Medicare Other | Attending: Internal Medicine | Admitting: Internal Medicine

## 2017-05-04 DIAGNOSIS — E871 Hypo-osmolality and hyponatremia: Secondary | ICD-10-CM | POA: Diagnosis not present

## 2017-05-04 DIAGNOSIS — D6832 Hemorrhagic disorder due to extrinsic circulating anticoagulants: Secondary | ICD-10-CM | POA: Diagnosis not present

## 2017-05-04 DIAGNOSIS — S0990XA Unspecified injury of head, initial encounter: Secondary | ICD-10-CM

## 2017-05-04 DIAGNOSIS — Z7983 Long term (current) use of bisphosphonates: Secondary | ICD-10-CM

## 2017-05-04 DIAGNOSIS — S0181XA Laceration without foreign body of other part of head, initial encounter: Secondary | ICD-10-CM

## 2017-05-04 DIAGNOSIS — Z800 Family history of malignant neoplasm of digestive organs: Secondary | ICD-10-CM

## 2017-05-04 DIAGNOSIS — D62 Acute posthemorrhagic anemia: Secondary | ICD-10-CM | POA: Diagnosis present

## 2017-05-04 DIAGNOSIS — G92 Toxic encephalopathy: Principal | ICD-10-CM | POA: Diagnosis present

## 2017-05-04 DIAGNOSIS — T45515A Adverse effect of anticoagulants, initial encounter: Secondary | ICD-10-CM | POA: Diagnosis present

## 2017-05-04 DIAGNOSIS — Z888 Allergy status to other drugs, medicaments and biological substances status: Secondary | ICD-10-CM

## 2017-05-04 DIAGNOSIS — L98429 Non-pressure chronic ulcer of back with unspecified severity: Secondary | ICD-10-CM | POA: Diagnosis present

## 2017-05-04 DIAGNOSIS — R7989 Other specified abnormal findings of blood chemistry: Secondary | ICD-10-CM

## 2017-05-04 DIAGNOSIS — S0101XA Laceration without foreign body of scalp, initial encounter: Secondary | ICD-10-CM | POA: Diagnosis present

## 2017-05-04 DIAGNOSIS — J209 Acute bronchitis, unspecified: Secondary | ICD-10-CM | POA: Diagnosis not present

## 2017-05-04 DIAGNOSIS — L89151 Pressure ulcer of sacral region, stage 1: Secondary | ICD-10-CM | POA: Diagnosis present

## 2017-05-04 DIAGNOSIS — S098XXA Other specified injuries of head, initial encounter: Secondary | ICD-10-CM | POA: Diagnosis not present

## 2017-05-04 DIAGNOSIS — S199XXA Unspecified injury of neck, initial encounter: Secondary | ICD-10-CM | POA: Diagnosis not present

## 2017-05-04 DIAGNOSIS — Z8249 Family history of ischemic heart disease and other diseases of the circulatory system: Secondary | ICD-10-CM

## 2017-05-04 DIAGNOSIS — R791 Abnormal coagulation profile: Secondary | ICD-10-CM

## 2017-05-04 DIAGNOSIS — I700 Atherosclerosis of aorta: Secondary | ICD-10-CM | POA: Diagnosis present

## 2017-05-04 DIAGNOSIS — E222 Syndrome of inappropriate secretion of antidiuretic hormone: Secondary | ICD-10-CM | POA: Diagnosis present

## 2017-05-04 DIAGNOSIS — S0180XA Unspecified open wound of other part of head, initial encounter: Secondary | ICD-10-CM | POA: Diagnosis not present

## 2017-05-04 DIAGNOSIS — E7800 Pure hypercholesterolemia, unspecified: Secondary | ICD-10-CM | POA: Diagnosis present

## 2017-05-04 DIAGNOSIS — Z823 Family history of stroke: Secondary | ICD-10-CM

## 2017-05-04 DIAGNOSIS — I10 Essential (primary) hypertension: Secondary | ICD-10-CM | POA: Diagnosis present

## 2017-05-04 DIAGNOSIS — W19XXXA Unspecified fall, initial encounter: Secondary | ICD-10-CM

## 2017-05-04 DIAGNOSIS — R6510 Systemic inflammatory response syndrome (SIRS) of non-infectious origin without acute organ dysfunction: Secondary | ICD-10-CM | POA: Diagnosis present

## 2017-05-04 DIAGNOSIS — Z820 Family history of epilepsy and other diseases of the nervous system: Secondary | ICD-10-CM

## 2017-05-04 DIAGNOSIS — Z952 Presence of prosthetic heart valve: Secondary | ICD-10-CM

## 2017-05-04 DIAGNOSIS — I482 Chronic atrial fibrillation, unspecified: Secondary | ICD-10-CM

## 2017-05-04 DIAGNOSIS — Z7901 Long term (current) use of anticoagulants: Secondary | ICD-10-CM

## 2017-05-04 DIAGNOSIS — T50905A Adverse effect of unspecified drugs, medicaments and biological substances, initial encounter: Secondary | ICD-10-CM | POA: Diagnosis not present

## 2017-05-04 DIAGNOSIS — I4891 Unspecified atrial fibrillation: Secondary | ICD-10-CM | POA: Diagnosis not present

## 2017-05-04 DIAGNOSIS — G9340 Encephalopathy, unspecified: Secondary | ICD-10-CM | POA: Diagnosis present

## 2017-05-04 DIAGNOSIS — G3184 Mild cognitive impairment, so stated: Secondary | ICD-10-CM | POA: Diagnosis present

## 2017-05-04 LAB — BASIC METABOLIC PANEL
ANION GAP: 11 (ref 5–15)
BUN: 18 mg/dL (ref 6–20)
CALCIUM: 7.8 mg/dL — AB (ref 8.9–10.3)
CO2: 18 mmol/L — AB (ref 22–32)
Chloride: 101 mmol/L (ref 101–111)
Creatinine, Ser: 1.37 mg/dL — ABNORMAL HIGH (ref 0.44–1.00)
GFR calc Af Amer: 43 mL/min — ABNORMAL LOW (ref >60)
GFR calc non Af Amer: 37 mL/min — ABNORMAL LOW (ref >60)
GLUCOSE: 131 mg/dL — AB (ref 65–99)
Potassium: 4.8 mmol/L (ref 3.5–5.1)
Sodium: 130 mmol/L — ABNORMAL LOW (ref 135–145)

## 2017-05-04 LAB — TROPONIN I
TROPONIN I: 0.08 ng/mL — AB (ref <0.03)
Troponin I: 0.17 ng/mL (ref <0.03)

## 2017-05-04 LAB — OSMOLALITY: Osmolality: 268 mOsm/kg — ABNORMAL LOW (ref 275–295)

## 2017-05-04 LAB — COMPREHENSIVE METABOLIC PANEL
ALBUMIN: 3.8 g/dL (ref 3.5–5.0)
ALK PHOS: 52 U/L (ref 38–126)
ALT: 24 U/L (ref 14–54)
ANION GAP: 15 (ref 5–15)
AST: 39 U/L (ref 15–41)
BILIRUBIN TOTAL: 1.5 mg/dL — AB (ref 0.3–1.2)
BUN: 16 mg/dL (ref 6–20)
CO2: 18 mmol/L — ABNORMAL LOW (ref 22–32)
Calcium: 8.6 mg/dL — ABNORMAL LOW (ref 8.9–10.3)
Chloride: 93 mmol/L — ABNORMAL LOW (ref 101–111)
Creatinine, Ser: 1.63 mg/dL — ABNORMAL HIGH (ref 0.44–1.00)
GFR calc Af Amer: 35 mL/min — ABNORMAL LOW (ref >60)
GFR, EST NON AFRICAN AMERICAN: 30 mL/min — AB (ref >60)
GLUCOSE: 244 mg/dL — AB (ref 65–99)
POTASSIUM: 5.6 mmol/L — AB (ref 3.5–5.1)
Sodium: 126 mmol/L — ABNORMAL LOW (ref 135–145)
TOTAL PROTEIN: 6.3 g/dL — AB (ref 6.5–8.1)

## 2017-05-04 LAB — URINALYSIS, ROUTINE W REFLEX MICROSCOPIC
Bilirubin Urine: NEGATIVE
Glucose, UA: NEGATIVE mg/dL
Hgb urine dipstick: NEGATIVE
KETONES UR: NEGATIVE mg/dL
Leukocytes, UA: NEGATIVE
Nitrite: NEGATIVE
PROTEIN: 30 mg/dL — AB
Specific Gravity, Urine: 1.011 (ref 1.005–1.030)
pH: 5 (ref 5.0–8.0)

## 2017-05-04 LAB — CBC WITH DIFFERENTIAL/PLATELET
BASOS PCT: 0 %
Basophils Absolute: 0 10*3/uL (ref 0.0–0.1)
EOS ABS: 0 10*3/uL (ref 0.0–0.7)
EOS PCT: 0 %
HCT: 31.7 % — ABNORMAL LOW (ref 36.0–46.0)
Hemoglobin: 10.8 g/dL — ABNORMAL LOW (ref 12.0–15.0)
LYMPHS ABS: 1.3 10*3/uL (ref 0.7–4.0)
Lymphocytes Relative: 7 %
MCH: 28.9 pg (ref 26.0–34.0)
MCHC: 34.1 g/dL (ref 30.0–36.0)
MCV: 84.8 fL (ref 78.0–100.0)
Monocytes Absolute: 0.8 10*3/uL (ref 0.1–1.0)
Monocytes Relative: 4 %
Neutro Abs: 16.7 10*3/uL — ABNORMAL HIGH (ref 1.7–7.7)
Neutrophils Relative %: 89 %
PLATELETS: 341 10*3/uL (ref 150–400)
RBC: 3.74 MIL/uL — AB (ref 3.87–5.11)
RDW: 14.2 % (ref 11.5–15.5)
WBC: 18.8 10*3/uL — AB (ref 4.0–10.5)

## 2017-05-04 LAB — LACTIC ACID, PLASMA
LACTIC ACID, VENOUS: 2.2 mmol/L — AB (ref 0.5–1.9)
Lactic Acid, Venous: 5.4 mmol/L (ref 0.5–1.9)
Lactic Acid, Venous: 3.8 mmol/L (ref 0.5–1.9)

## 2017-05-04 LAB — CBG MONITORING, ED: Glucose-Capillary: 226 mg/dL — ABNORMAL HIGH (ref 65–99)

## 2017-05-04 LAB — PROTIME-INR
INR: 4.26 — AB
PROTHROMBIN TIME: 40.6 s — AB (ref 11.4–15.2)

## 2017-05-04 LAB — CK TOTAL AND CKMB (NOT AT ARMC)
CK TOTAL: 128 U/L (ref 38–234)
CK, MB: 5.3 ng/mL — AB (ref 0.5–5.0)
RELATIVE INDEX: 4.1 — AB (ref 0.0–2.5)

## 2017-05-04 LAB — ETHANOL: Alcohol, Ethyl (B): <10 mg/dL (ref <10)

## 2017-05-04 MED ORDER — HYPROMELLOSE 0.3 % OP GEL: 1.0000 "application " | Freq: Every day | OPHTHALMIC | Status: DC

## 2017-05-04 MED ORDER — CALCIUM CARBONATE 1250 (500 CA) MG PO TABS
1.0000 | ORAL_TABLET | Freq: Every day | ORAL | Status: DC
  Administered 2017-05-05 – 2017-05-09 (×5): 500 mg via ORAL
  Filled 2017-05-04 (×6): qty 1

## 2017-05-04 MED ORDER — VANCOMYCIN HCL IN DEXTROSE 1-5 GM/200ML-% IV SOLN
1000.0000 mg | Freq: Once | INTRAVENOUS | Status: AC
  Administered 2017-05-04: 1000 mg via INTRAVENOUS
  Filled 2017-05-04: qty 200

## 2017-05-04 MED ORDER — VITAMIN D 1000 UNITS PO TABS
1000.0000 [IU] | ORAL_TABLET | Freq: Every day | ORAL | Status: DC
Start: 2017-05-05 — End: 2017-05-09
  Administered 2017-05-05 – 2017-05-09 (×5): 1000 [IU] via ORAL
  Filled 2017-05-04 (×6): qty 1

## 2017-05-04 MED ORDER — ONDANSETRON HCL 4 MG/2ML IJ SOLN
4.0000 mg | Freq: Once | INTRAMUSCULAR | Status: DC
  Filled 2017-05-04: qty 2

## 2017-05-04 MED ORDER — ONDANSETRON 4 MG PO TBDP: 4.0000 mg | ORAL_TABLET | Freq: Three times a day (TID) | ORAL | Status: DC | PRN

## 2017-05-04 MED ORDER — OMEGA-3 FISH OIL 1200 MG PO CAPS: 2400.0000 mg | ORAL_CAPSULE | Freq: Two times a day (BID) | ORAL | Status: DC

## 2017-05-04 MED ORDER — CALCIUM CARBONATE 600 MG PO TABS: 600.0000 mg | ORAL_TABLET | Freq: Every day | ORAL | Status: DC

## 2017-05-04 MED ORDER — SODIUM CHLORIDE 0.9 % IV BOLUS (SEPSIS)
1000.0000 mL | Freq: Once | INTRAVENOUS | Status: AC
  Administered 2017-05-04: 1000 mL via INTRAVENOUS

## 2017-05-04 MED ORDER — ACETAMINOPHEN 650 MG RE SUPP: 650.0000 mg | Freq: Four times a day (QID) | RECTAL | Status: DC | PRN

## 2017-05-04 MED ORDER — PIPERACILLIN-TAZOBACTAM IN DEX 2-0.25 GM/50ML IV SOLN
2.2500 g | Freq: Once | INTRAVENOUS | Status: AC
  Administered 2017-05-04: 2.25 g via INTRAVENOUS
  Filled 2017-05-04: qty 50

## 2017-05-04 MED ORDER — POLYVINYL ALCOHOL 1.4 % OP SOLN
1.0000 [drp] | Freq: Every day | OPHTHALMIC | Status: DC
  Administered 2017-05-07 – 2017-05-08 (×2): 1 [drp] via OPHTHALMIC
  Filled 2017-05-04 (×2): qty 15

## 2017-05-04 MED ORDER — SODIUM CHLORIDE 0.9 % IV SOLN
INTRAVENOUS | Status: DC
  Administered 2017-05-04: 20:00:00 via INTRAVENOUS

## 2017-05-04 MED ORDER — LIDOCAINE HCL (PF) 1 % IJ SOLN
INTRAMUSCULAR | Status: AC
  Filled 2017-05-04: qty 30

## 2017-05-04 MED ORDER — OMEGA-3-ACID ETHYL ESTERS 1 G PO CAPS
2.0000 g | ORAL_CAPSULE | Freq: Two times a day (BID) | ORAL | Status: DC
  Administered 2017-05-05 – 2017-05-09 (×8): 2 g via ORAL
  Filled 2017-05-04 (×9): qty 2

## 2017-05-04 MED ORDER — SODIUM CHLORIDE 0.45 % IV SOLN
INTRAVENOUS | Status: DC
  Administered 2017-05-04: 21:00:00 via INTRAVENOUS

## 2017-05-04 MED ORDER — ONDANSETRON HCL 4 MG/2ML IJ SOLN
4.0000 mg | Freq: Three times a day (TID) | INTRAMUSCULAR | Status: DC | PRN
  Administered 2017-05-04: 4 mg via INTRAVENOUS
  Filled 2017-05-04: qty 2

## 2017-05-04 MED ORDER — METOPROLOL SUCCINATE ER 50 MG PO TB24
50.0000 mg | ORAL_TABLET | Freq: Every day | ORAL | Status: DC
  Administered 2017-05-04 – 2017-05-09 (×6): 50 mg via ORAL
  Filled 2017-05-04 (×6): qty 1

## 2017-05-04 MED ORDER — POLYETHYLENE GLYCOL 3350 17 G PO PACK: 17.0000 g | PACK | Freq: Every day | ORAL | Status: DC | PRN

## 2017-05-04 MED ORDER — ACETAMINOPHEN 325 MG PO TABS
650.0000 mg | ORAL_TABLET | Freq: Four times a day (QID) | ORAL | Status: DC | PRN
  Filled 2017-05-04: qty 2

## 2017-05-04 MED ORDER — ATORVASTATIN CALCIUM 10 MG PO TABS
10.0000 mg | ORAL_TABLET | Freq: Every day | ORAL | Status: DC
  Administered 2017-05-07 – 2017-05-08 (×3): 10 mg via ORAL
  Filled 2017-05-04 (×3): qty 1

## 2017-05-04 MED ORDER — ONDANSETRON HCL 4 MG/2ML IJ SOLN
4.0000 mg | Freq: Once | INTRAMUSCULAR | Status: AC
  Administered 2017-05-04: 4 mg via INTRAVENOUS

## 2017-05-04 MED ORDER — LOSARTAN POTASSIUM 50 MG PO TABS
100.0000 mg | ORAL_TABLET | Freq: Every day | ORAL | Status: DC
  Administered 2017-05-04 – 2017-05-09 (×6): 100 mg via ORAL
  Filled 2017-05-04 (×6): qty 2

## 2017-05-04 NOTE — ED Notes (Signed)
Admitting at bedside 

## 2017-05-04 NOTE — ED Notes (Signed)
Paged IM to [place], Therapist, sports
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
## 2017-05-04 NOTE — ED Notes (Signed)
PT c/o nausea

## 2017-05-04 NOTE — ED Notes (Signed)
C-collar removed per PA

## 2017-05-04 NOTE — ED Triage Notes (Signed)
To ED for eval after waking this am with blood all over bed, headboard, and a little tracked thru home. Unknown injury. Copius amt of blood in the bed. Dried blood in hair and on face. Pt is alert and oriented but doesn't know what happened. EMS forced entry into the home.

## 2017-05-04 NOTE — ED Provider Notes (Signed)
[facility] EMERGENCY DEPARTMENT Provider Note   CSN: [id number] Arrival date & time: 05/04/17  1248     History   Chief Complaint Chief Complaint  Patient presents with  . Head Injury    HPI Jodi Frank is a 76 y.o. female.  Pt comes in with head injury. Ems reports that she was found in bed with large amounts of blood to the bed with small amount tracked thru the house. Pt states that she called ems because she felt weak and she states that she fell going to the door. Ems states that she doesn't remember falling.she states that she is on a thinner and for a heart valve. She states that she is having a small amount of pain in the forehead. Denies neck pain. She states that she did have some wine last night but she has wine every night. Pt states that she drinks wine every night and she drank wine last night      Past Medical History:  Diagnosis Date  . Broken wrist   . Depression   . Diarrhea   . High cholesterol   . Hypertension   . Memory difficulty 01/18/2016  . 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 Vertigo 01/18/2016    Patient Active Problem List   Diagnosis Date Noted  . Memory difficulty 01/18/2016  . Vertigo 01/18/2016  . Hip fracture, left S/P cannulated hip pinning 11/10/2012  . Insomnia 11/10/2012  . Muscle spasm 11/10/2012  . Essential hypertension, benign 11/10/2012  . Constipation 11/10/2012  . Distal radius fracture, left 11/10/2012  . Long term (current) use of anticoagulants 11/10/2012  . Acute blood loss anemia 11/05/2012  . DIARRHEA 01/30/2010  . DEPRESSION 12/08/2006  . ALLERGIC RHINITIS 12/08/2006    Past Surgical History:  Procedure Laterality Date  . HEMATOMA EVACUATION Left 11/25/2012   Procedure: EVACUATION HEMATOMA;  Surgeon: Linna Hoff, MD;  Location: [facility];  Service: Orthopedics;  Laterality: Left;  . HIP PINNING,CANNULATED Left 11/03/2012   Procedure: CANNULATED HIP PINNING;  Surgeon: Tobi Bastos,  MD;  Location: [facility];  Service: Orthopedics;  Laterality: Left;  . 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  . OPEN REDUCTION INTERNAL FIXATION (ORIF) WRIST WITH ILIAC CREST BONE GRAFT Left 11/24/2012   Procedure: OPEN REDUCTION INTERNAL FIXATION LEFT WRIST POSSIBLE BONE GRAFTING AND PINNING (ORIF) ;  Surgeon: Linna Hoff, MD;  Location: [facility];  Service: Orthopedics;  Laterality: Left;  . SHOULDER OPEN ROTATOR CUFF REPAIR      OB History    No data available       Home Medications    Prior to Admission medications   Medication Sig Start Date End Date Taking? Authorizing Provider  calcium carbonate (OS-CAL) 600 MG TABS tablet Take by mouth.    [provider]  Carboxymethylcell-Hypromellose (GENTEAL) 0.25-0.3 % GEL Apply 1 application to eye every evening.    [provider]  cholecalciferol (VITAMIN D) 1000 UNITS tablet Take 1,000 Units by mouth daily.    [provider]  cloNIDine (CATAPRES - DOSED IN MG/24 HR) 0.1 mg/24hr patch Place onto the skin. 01/17/16 01/16/17  [provider]  fish oil-omega-3 fatty acids 1000 MG capsule Take 2 g by mouth 2 (two) times daily.     [provider]  ibandronate (BONIVA) 150 MG tablet Take 150 once monthly in the morning with a full glass of water and do not take anything  else by mouth or lie down for the next 30 min. 01/27/13   [provider]  losartan (COZAAR) 100 MG tablet Take by mouth. 12/24/15 12/23/16  [provider]  meclizine (ANTIVERT) 25 MG tablet Take 25 mg by mouth as needed for dizziness.    [provider]  Melatonin 1 MG TABS Take by mouth. 01/17/16   [provider]  Multiple Vitamin (MULTIVITAMIN) capsule Take by mouth.    [provider]  rosuvastatin (CRESTOR) 5 MG tablet Take 5 mg by mouth daily.    [provider]    Family History Family History  Problem Relation Age of Onset  . Stroke Mother   . Hypertension Mother     . Stroke Father   . Colon cancer Paternal Grandmother   . Parkinsonism Brother     Social History Social History   Tobacco Use  . Smoking status: Never Smoker  . Smokeless tobacco: Never Used  Substance Use Topics  . Alcohol use: Yes    Alcohol/week: 2.4 oz    Types: 4 Glasses of wine per week  . Drug use: No     Allergies   Carvedilol; Ace inhibitors; Amlodipine; and Hydrochlorothiazide   Review of Systems Review of Systems  All other systems reviewed and are negative.    Physical Exam Updated Vital Signs Pulse (!) 117   Temp 99.4 F (37.4 C) (Rectal)   Resp 20   SpO2 99%   Physical Exam  Constitutional: She is oriented to person, place, and time. She appears well-developed and well-nourished.  HENT:  Head: Normocephalic and atraumatic.  Eyes: EOM are normal. Pupils are equal, round, and reactive to light.  Neck: Neck supple.  Pulmonary/Chest: Effort normal and breath sounds normal.  Abdominal: Soft. Bowel sounds are normal.  Musculoskeletal:       Cervical back: She exhibits normal range of motion.       Thoracic back: She exhibits normal range of motion.       Lumbar back: She exhibits normal range of motion.  Neurological: She is alert and oriented to person, place, and time.  Skin:  Large vertical laceration to forehead   Psychiatric: She has a normal mood and affect.  Vitals reviewed.    ED Treatments / Results  Labs (all labs ordered are listed, but only abnormal results are displayed) Labs Reviewed  COMPREHENSIVE METABOLIC PANEL - Abnormal; Notable for the following components:      Result Value   Sodium 126 (*)    Potassium 5.6 (*)    Chloride 93 (*)    CO2 18 (*)    Glucose, Bld 244 (*)    Creatinine, Ser 1.63 (*)    Calcium 8.6 (*)    Total Protein 6.3 (*)    Total Bilirubin 1.5 (*)    GFR calc non Af Amer 30 (*)    GFR calc Af Amer 35 (*)    All other components within normal limits  TROPONIN I - Abnormal; Notable for the  following components:   Troponin I 0.08 (*)    All other components within normal limits  LACTIC ACID, PLASMA - Abnormal; Notable for the following components:   Lactic Acid, Venous 5.4 (*)    All other components within normal limits  CBC WITH DIFFERENTIAL/PLATELET - Abnormal; Notable for the following components:   WBC 18.8 (*)    RBC 3.74 (*)    Hemoglobin 10.8 (*)    HCT 31.7 (*)  Neutro Abs 16.7 (*)    All other components within normal limits  PROTIME-INR - Abnormal; Notable for the following components:   Prothrombin Time 40.6 (*)    INR 4.26 (*)    All other components within normal limits  CBG MONITORING, ED - Abnormal; Notable for the following components:   Glucose-Capillary 226 (*)    All other components within normal limits  CULTURE, BLOOD (ROUTINE X 2)  CULTURE, BLOOD (ROUTINE X 2)  LACTIC ACID, PLASMA  URINALYSIS, ROUTINE W REFLEX MICROSCOPIC  CK TOTAL AND CKMB (NOT AT [facility])    EKG  EKG Interpretation  Date/Time:  Monday May 04 2017 12:55:05 EST Ventricular Rate:  119 PR Interval:    QRS Duration: 95 QT Interval:  346 QTC Calculation: 487 R Axis:   79 Text Interpretation:  Atrial fibrillation Anteroseptal infarct, old Nonspecific repol abnormality, inferior leads Confirmed by Pattricia Boss [phone]) on 05/04/2017 3:19:45 PM       Radiology No results found.  Procedures .Marland KitchenLaceration Repair Date/Time: 05/04/2017 3:22 PM Performed by: Glendell Docker, NP Authorized by: Glendell Docker, NP   Consent:    Consent obtained:  Verbal   Consent given by:  Patient   Risks discussed:  Infection and pain   Alternatives discussed:  No treatment Anesthesia (see MAR for exact dosages):    Anesthesia method:  Local infiltration   Local anesthetic:  Lidocaine 1% w/o epi Laceration details:    Length (cm):  7 Repair type:    Repair type:  Simple Treatment:    Area cleansed with:  Hibiclens and saline   Amount of cleaning:  Standard   Irrigation  solution:  Sterile saline   Irrigation method:  Pressure wash   Visualized foreign bodies/material removed: no   Skin repair:    Repair method:  Sutures   Suture size:  5-0   Suture material:  Prolene   Suture technique:  Simple interrupted Approximation:    Approximation:  Close   Vermilion border: well-aligned   Post-procedure details:    Patient tolerance of procedure:  Tolerated well, no immediate complications   (including critical care time)  Medications Ordered in ED Medications - No data to display   Initial Impression / Assessment and Plan / ED Course  I have reviewed the triage vital signs and the nursing notes.  Pertinent labs & imaging results that were available during my care of the patient were reviewed by me and considered in my medical decision making (see chart for details).     Unsure of the circumstances under which the pt fell.considered pt having to much wine and possible trazadone. Can't rule out dehydration or possible uti. Urine is not back at this time. Pt is getting hydrated with fluid. Pt is not having any cp. tropinin noted sent for ck total as not sure if pt spent any time on the floor.  Final Clinical Impressions(s) / ED Diagnoses   Final diagnoses:  Injury of head, initial encounter  Laceration of forehead, initial encounter  Hyponatremia  INR (international normal ratio) abnormal  Atrial fibrillation, chronic (HCC)  Lactic acid blood increased    ED Discharge Orders    None       Glendell Docker, NP 05/04/17 1633    Pattricia Boss, MD 05/05/17 [phone]

## 2017-05-04 NOTE — H&P (Signed)
Date: 05/04/2017               Patient Name:  Jodi Frank MRN: [id number]  DOB: 12-02-1940 Age / Sex: 76 y.o., female   PCP: Leamon Arnt, MD         Medical Service: Internal Medicine Teaching Service         Attending Physician: Dr. Lucious Groves, DO    First Contact: Dr. Lars Mage Pager: [phone]  Second Contact: Dr. Alphonzo Grieve Pager: [phone]       After Hours (After 5p/  First Contact Pager: [phone]  weekends / holidays): Second Contact Pager: [phone]   Chief Complaint: Bleeding from her head  History of Present Illness:   Jodi Frank is a 76 y.o f with a history of atrial fibrillation, mechanical mitral valve, and essential hypertension presented with a bleed to her head. The patient recalls that she drank several glasses of wine and then thinks that she may have fell. She woke up this morning and found large amount of blood in the bed and she called ems. EMS forced entry into the house and the patient was found to have blood all over her head and some dried blood in her hair and on her face. However, the patient reports that she walked to the door and she fell on her way there.   The patient states that she has occasionally had nasal bleeding when on warfarin, but never to this extent. She states that she is compliant with her warfarin 5mg  every day and all her other medications. She has been on the same dose for several months now. The patient's friend was at bedside stated that they feel that the patient might be having some dementia as she has been forgetful lately. The patient apparently reported to the pharmacist that she has occasionally missed doses of her beta blocker and therefore the patient's friend speculates that she maybe missing other doses of medication as well.   ED Course:  Tachycardic (117), Tachypnic (20), saturating well on room air 99%, and afebrile CT head ordered Urinalysis  Troponin CK Gave a dose of vancomycin and zosyn  Meds:    Current Meds  Medication Sig  . alendronate (FOSAMAX) 70 MG tablet Take 70 mg by mouth every Sunday. Take with a full glass of water on an empty stomach.  Marland Kitchen atorvastatin (LIPITOR) 10 MG tablet Take 10 mg by mouth at bedtime.  . calcium carbonate (OS-CAL) 600 MG TABS tablet Take 600 mg by mouth daily with breakfast.   . Cholecalciferol (VITAMIN D3) 1000 units CAPS Take 1,000 Units by mouth daily.  . hydrALAZINE (APRESOLINE) 25 MG tablet Take 25 mg by mouth every evening.  . hydrOXYzine (VISTARIL) 25 MG capsule Take 25 mg by mouth 3 (three) times daily as needed for itching.  . hypromellose (SYSTANE OVERNIGHT THERAPY) 0.3 % GEL ophthalmic ointment Place 1 application into both eyes at bedtime.  Marland Kitchen losartan (COZAAR) 100 MG tablet Take 100 mg by mouth daily.   . meclizine (ANTIVERT) 25 MG tablet Take 25 mg by mouth 3 (three) times daily as needed for dizziness.   . metoprolol succinate (TOPROL-XL) 50 MG 24 hr tablet Take 50 mg by mouth daily.  . Multiple Vitamin (MULTIVITAMIN) capsule Take 1 capsule by mouth daily.   . Omega-3 Fatty Acids (OMEGA-3 FISH OIL) 1200 MG CAPS Take 2,400 mg by mouth 2 (two) times daily.  . traZODone (DESYREL) 50 MG tablet Take 25-50 mg  by mouth at bedtime as needed for sleep.  Marland Kitchen warfarin (COUMADIN) 5 MG tablet Take 5 mg by mouth every evening.     Allergies: Allergies as of 05/04/2017 - Review Complete 05/04/2017  Allergen Reaction Noted  . Carvedilol Itching 05/04/2015  . Ace inhibitors Other (See Comments) 05/04/2015  . Amlodipine Swelling 12/06/2015  . Hydrochlorothiazide Other (See Comments) 05/04/2015   Past Medical History:  Diagnosis Date  . Broken wrist   . Depression   . Diarrhea   . High cholesterol   . Hypertension   . Memory difficulty 01/18/2016  . 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 Vertigo 01/18/2016    Family History:   Family History  Problem Relation Age of Onset  . Stroke Mother   . Hypertension Mother   . Stroke Father    . Colon cancer Paternal Grandmother   . Parkinsonism Brother    Social History:   Social History   Socioeconomic History  . Marital status: Divorced    Spouse name: None  . Number of children: 1  . Years of education: BS  . Highest education level: None  Social Needs  . Financial resource strain: None  . Food insecurity - worry: None  . Food insecurity - inability: None  . Transportation needs - medical: None  . Transportation needs - non-medical: None  Occupational History  . Occupation: Retired  Tobacco Use  . Smoking status: Never Smoker  . Smokeless tobacco: Never Used  Substance and Sexual Activity  . Alcohol use: Yes    Alcohol/week: 2.4 oz    Types: 4 Glasses of wine per week  . Drug use: No  . Sexual activity: None  Other Topics Concern  . None  Social History Narrative   Lives at home, alone   Right-handed   Caffeine: 2 cups per day  The patient lives alone and is able to complete all her adls alone. She does not use any assistive devices. She volunteers at [employer].   Review of Systems: A complete ROS was negative except as per HPI.   Physical Exam: Blood pressure (!) 130/58, pulse (!) 110, temperature 100 F (37.8 C), temperature source Rectal, resp. rate 18, SpO2 100 %.  Physical Exam  Constitutional: She is oriented to person, place, and time. She appears well-developed and well-nourished. No distress.  HENT:  Patient had 5-6cm laceration on forehead   Eyes: Conjunctivae are normal.  Cardiovascular: Normal rate, regular rhythm, normal heart sounds and intact distal pulses.  Respiratory: Effort normal and breath sounds normal. No respiratory distress. She has no wheezes.  GI: Soft. Bowel sounds are normal. She exhibits no distension. There is no tenderness.  Musculoskeletal:  5/5 bilateral upper and lower extremity muscle strength  Neurological: She is alert and oriented to person, place, and time. No cranial nerve deficit.  Skin: She is not  diaphoretic.  Psychiatric: She has a normal mood and affect. Her behavior is normal. Judgment and thought content normal.    EKG: personally reviewed my interpretation is atrial fibrillation.  CXR: personally reviewed my interpretation is no acute abnormalities and mild chronic bronchitic changes.  CT cervical spine wo contrast (05/04/17):  1. Chronic ischemic microangiopathy without acute intracranial abnormality. 2. Left frontal scalp laceration and small associated subgaleal hematoma. No skull fracture. 3. No acute fracture or static subluxation of the cervical spine. 4. Mild C4-5 spinal canal narrowing due to disc osteophyte complex. Neural foraminal narrowing at right C3-4, right C4-5 and  left C5-6 secondary to uncovertebral hypertrophy.  CT Head wo contrast (05/04/17): 1. Chronic ischemic microangiopathy without acute intracranial abnormality. 2. Left frontal scalp laceration and small associated subgaleal hematoma. No skull fracture. 3. No acute fracture or static subluxation of the cervical spine. 4. Mild C4-5 spinal canal narrowing due to disc osteophyte complex. Neural foraminal narrowing at right C3-4, right C4-5 and left C5-6 secondary to uncovertebral hypertrophy.  Assessment & Plan by Problem:  76 y.o f with pmh of prosthetic mitral valve, essential hypertension, and atrial fibrillation presented post head trauma causing bleed and acute encephalopathy.   Acute Encephalopathy causing subgaleal hematoma The patient woke up with excessive bleeding this morning. She does not recall any trauma. CT head showed a left frontal scalp laceration and associated subgaleal hematoma without any scull fractures. The patient does not note any new onset neurological deficits.  The patient states that she has a history of dizziness for which she uses meclizine. I suspect that the patient may have had trauma to her head and subsequent bleed to her head likely due to her supra therapeutic  INR. Her INR on admission was 4.26 and prothrombin time as 40.6.   The patient meets SIRS criteria on admission with an elevated lactic acid of 5.4, wbc=18.8, absolute neutrophil count of 16.7, tachypnea, and tachycardia. However, the patient did not have fever nor a clear source of infection. It is possible that the patient's trauma may cause acute laboratory abnormalities of this nature.    Checked the patient's medication list to ensure that she is not on any medications to interact with warfarin and make her INR supra therapeutic.   Ethanol<10  The patient's ck=128 Urinalysis was negative for any nitrites and leukocytes. She has  -trending troponin -PT=40.6, INR=4.26 -neuro checks  Hyponatremia The patient's sodium on admission was 126. 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   -urine osmolality and urine sodium pending -2L sodium chloride bolus given  Essential Hypertension: The patient's blood pressure since admission has ranged 100-166/82-88.  -continue metoprolol 50mg  qd and losartan 100mg  daily -holding home hydralzine  Dispo: Admit patient to Inpatient with expected length of stay greater than 2 midnights.  Signed: Lars Mage, MD Internal Medicine PGY1 Pager:[phone] 05/04/2017, 7:51 PM

## 2017-05-04 NOTE — ED Notes (Signed)
Unable to obtain 2nd set of cultures.  MD aware and states ok to start antibiotics.

## 2017-05-04 NOTE — ED Notes (Signed)
Spoke with admitting providers to request nausea medication. Per provider to put order in.

## 2017-05-05 ENCOUNTER — Observation Stay (HOSPITAL_COMMUNITY): Payer: Medicare Other

## 2017-05-05 ENCOUNTER — Other Ambulatory Visit: Payer: Self-pay

## 2017-05-05 DIAGNOSIS — S065X9A Traumatic subdural hemorrhage with loss of consciousness of unspecified duration, initial encounter: Secondary | ICD-10-CM | POA: Diagnosis not present

## 2017-05-05 DIAGNOSIS — I482 Chronic atrial fibrillation: Secondary | ICD-10-CM

## 2017-05-05 DIAGNOSIS — R41841 Cognitive communication deficit: Secondary | ICD-10-CM | POA: Diagnosis not present

## 2017-05-05 DIAGNOSIS — X58XXXA Exposure to other specified factors, initial encounter: Secondary | ICD-10-CM | POA: Diagnosis not present

## 2017-05-05 DIAGNOSIS — W19XXXA Unspecified fall, initial encounter: Secondary | ICD-10-CM

## 2017-05-05 DIAGNOSIS — G3184 Mild cognitive impairment, so stated: Secondary | ICD-10-CM | POA: Diagnosis not present

## 2017-05-05 DIAGNOSIS — Z7983 Long term (current) use of bisphosphonates: Secondary | ICD-10-CM | POA: Diagnosis not present

## 2017-05-05 DIAGNOSIS — T50905A Adverse effect of unspecified drugs, medicaments and biological substances, initial encounter: Secondary | ICD-10-CM | POA: Diagnosis not present

## 2017-05-05 DIAGNOSIS — S0181XA Laceration without foreign body of other part of head, initial encounter: Secondary | ICD-10-CM

## 2017-05-05 DIAGNOSIS — Z7289 Other problems related to lifestyle: Secondary | ICD-10-CM | POA: Diagnosis not present

## 2017-05-05 DIAGNOSIS — D6832 Hemorrhagic disorder due to extrinsic circulating anticoagulants: Secondary | ICD-10-CM | POA: Diagnosis present

## 2017-05-05 DIAGNOSIS — Z888 Allergy status to other drugs, medicaments and biological substances status: Secondary | ICD-10-CM

## 2017-05-05 DIAGNOSIS — G9340 Encephalopathy, unspecified: Secondary | ICD-10-CM | POA: Diagnosis not present

## 2017-05-05 DIAGNOSIS — Z8249 Family history of ischemic heart disease and other diseases of the circulatory system: Secondary | ICD-10-CM | POA: Diagnosis not present

## 2017-05-05 DIAGNOSIS — R791 Abnormal coagulation profile: Secondary | ICD-10-CM

## 2017-05-05 DIAGNOSIS — R42 Dizziness and giddiness: Secondary | ICD-10-CM

## 2017-05-05 DIAGNOSIS — Z823 Family history of stroke: Secondary | ICD-10-CM | POA: Diagnosis not present

## 2017-05-05 DIAGNOSIS — E222 Syndrome of inappropriate secretion of antidiuretic hormone: Secondary | ICD-10-CM | POA: Diagnosis not present

## 2017-05-05 DIAGNOSIS — Z800 Family history of malignant neoplasm of digestive organs: Secondary | ICD-10-CM | POA: Diagnosis not present

## 2017-05-05 DIAGNOSIS — Z9889 Other specified postprocedural states: Secondary | ICD-10-CM | POA: Diagnosis not present

## 2017-05-05 DIAGNOSIS — H5789 Other specified disorders of eye and adnexa: Secondary | ICD-10-CM | POA: Diagnosis not present

## 2017-05-05 DIAGNOSIS — Z79899 Other long term (current) drug therapy: Secondary | ICD-10-CM

## 2017-05-05 DIAGNOSIS — D62 Acute posthemorrhagic anemia: Secondary | ICD-10-CM | POA: Diagnosis not present

## 2017-05-05 DIAGNOSIS — R6510 Systemic inflammatory response syndrome (SIRS) of non-infectious origin without acute organ dysfunction: Secondary | ICD-10-CM | POA: Diagnosis not present

## 2017-05-05 DIAGNOSIS — R2689 Other abnormalities of gait and mobility: Secondary | ICD-10-CM | POA: Diagnosis not present

## 2017-05-05 DIAGNOSIS — Z952 Presence of prosthetic heart valve: Secondary | ICD-10-CM

## 2017-05-05 DIAGNOSIS — I10 Essential (primary) hypertension: Secondary | ICD-10-CM | POA: Diagnosis not present

## 2017-05-05 DIAGNOSIS — D500 Iron deficiency anemia secondary to blood loss (chronic): Secondary | ICD-10-CM | POA: Diagnosis not present

## 2017-05-05 DIAGNOSIS — I351 Nonrheumatic aortic (valve) insufficiency: Secondary | ICD-10-CM | POA: Diagnosis not present

## 2017-05-05 DIAGNOSIS — E7800 Pure hypercholesterolemia, unspecified: Secondary | ICD-10-CM | POA: Diagnosis present

## 2017-05-05 DIAGNOSIS — Z9181 History of falling: Secondary | ICD-10-CM | POA: Diagnosis not present

## 2017-05-05 DIAGNOSIS — S0101XA Laceration without foreign body of scalp, initial encounter: Secondary | ICD-10-CM | POA: Diagnosis present

## 2017-05-05 DIAGNOSIS — L89151 Pressure ulcer of sacral region, stage 1: Secondary | ICD-10-CM | POA: Diagnosis present

## 2017-05-05 DIAGNOSIS — S0990XA Unspecified injury of head, initial encounter: Secondary | ICD-10-CM | POA: Diagnosis not present

## 2017-05-05 DIAGNOSIS — N2889 Other specified disorders of kidney and ureter: Secondary | ICD-10-CM | POA: Diagnosis not present

## 2017-05-05 DIAGNOSIS — S0181XD Laceration without foreign body of other part of head, subsequent encounter: Secondary | ICD-10-CM | POA: Diagnosis not present

## 2017-05-05 DIAGNOSIS — T45515A Adverse effect of anticoagulants, initial encounter: Secondary | ICD-10-CM | POA: Diagnosis present

## 2017-05-05 DIAGNOSIS — Z7901 Long term (current) use of anticoagulants: Secondary | ICD-10-CM

## 2017-05-05 DIAGNOSIS — I700 Atherosclerosis of aorta: Secondary | ICD-10-CM | POA: Diagnosis not present

## 2017-05-05 DIAGNOSIS — Z820 Family history of epilepsy and other diseases of the nervous system: Secondary | ICD-10-CM | POA: Diagnosis not present

## 2017-05-05 DIAGNOSIS — M6281 Muscle weakness (generalized): Secondary | ICD-10-CM | POA: Diagnosis not present

## 2017-05-05 DIAGNOSIS — R7989 Other specified abnormal findings of blood chemistry: Secondary | ICD-10-CM | POA: Diagnosis not present

## 2017-05-05 DIAGNOSIS — L98429 Non-pressure chronic ulcer of back with unspecified severity: Secondary | ICD-10-CM | POA: Diagnosis present

## 2017-05-05 DIAGNOSIS — G92 Toxic encephalopathy: Secondary | ICD-10-CM | POA: Diagnosis not present

## 2017-05-05 DIAGNOSIS — R200 Anesthesia of skin: Secondary | ICD-10-CM | POA: Diagnosis not present

## 2017-05-05 DIAGNOSIS — E871 Hypo-osmolality and hyponatremia: Secondary | ICD-10-CM | POA: Diagnosis not present

## 2017-05-05 LAB — BASIC METABOLIC PANEL
ANION GAP: 10 (ref 5–15)
Anion gap: 7 (ref 5–15)
BUN: 12 mg/dL (ref 6–20)
BUN: 11 mg/dL (ref 6–20)
CHLORIDE: 99 mmol/L — AB (ref 101–111)
CO2: 21 mmol/L — ABNORMAL LOW (ref 22–32)
CO2: 20 mmol/L — AB (ref 22–32)
CREATININE: 0.83 mg/dL (ref 0.44–1.00)
Calcium: 7.5 mg/dL — ABNORMAL LOW (ref 8.9–10.3)
Calcium: 8.2 mg/dL — ABNORMAL LOW (ref 8.9–10.3)
Chloride: 98 mmol/L — ABNORMAL LOW (ref 101–111)
Creatinine, Ser: 0.78 mg/dL (ref 0.44–1.00)
GFR calc Af Amer: >60 mL/min (ref >60)
GFR calc Af Amer: >60 mL/min (ref >60)
GFR calc non Af Amer: >60 mL/min (ref >60)
GLUCOSE: 142 mg/dL — AB (ref 65–99)
Glucose, Bld: 130 mg/dL — ABNORMAL HIGH (ref 65–99)
POTASSIUM: 4.2 mmol/L (ref 3.5–5.1)
Potassium: 4 mmol/L (ref 3.5–5.1)
SODIUM: 127 mmol/L — AB (ref 135–145)
Sodium: 128 mmol/L — ABNORMAL LOW (ref 135–145)

## 2017-05-05 LAB — ECHOCARDIOGRAM COMPLETE
Height: 61 in
Weight: 2496 oz

## 2017-05-05 LAB — TROPONIN I
TROPONIN I: 0.32 ng/mL — AB (ref <0.03)
TROPONIN I: 0.24 ng/mL — AB (ref <0.03)

## 2017-05-05 LAB — PROTIME-INR
INR: 4.29
PROTHROMBIN TIME: 40.6 s — AB (ref 11.4–15.2)

## 2017-05-05 LAB — MRSA PCR SCREENING: MRSA BY PCR: NEGATIVE

## 2017-05-05 LAB — SODIUM, URINE, RANDOM: Sodium, Ur: 72 mmol/L

## 2017-05-05 LAB — OSMOLALITY, URINE: Osmolality, Ur: 382 mOsm/kg (ref 300–900)

## 2017-05-05 MED ORDER — RAMELTEON 8 MG PO TABS
8.0000 mg | ORAL_TABLET | Freq: Every day | ORAL | Status: DC
  Administered 2017-05-05 – 2017-05-08 (×4): 8 mg via ORAL
  Filled 2017-05-05 (×6): qty 1

## 2017-05-05 MED ORDER — BACITRACIN ZINC 500 UNIT/GM EX OINT
TOPICAL_OINTMENT | Freq: Two times a day (BID) | CUTANEOUS | Status: DC
  Administered 2017-05-06: 1 via TOPICAL
  Administered 2017-05-07 – 2017-05-09 (×5): via TOPICAL
  Filled 2017-05-05 (×2): qty 28.35

## 2017-05-05 NOTE — Progress Notes (Signed)
Late entry.   CRITICAL VALUE ALERT  Critical Value:  INR 4.29  Date & Time Notied:  12/11 0721  Provider Notified: IMTS intern  Orders Received/Actions taken: No new orders at the time.

## 2017-05-05 NOTE — Evaluation (Signed)
Occupational Therapy Evaluation Patient Details Name: Jodi Frank MRN: [id number] DOB: 10-Jul-1940 Today's Date: 05/05/2017    History of Present Illness Pt is a 76 y.o. female admitted with head trauma with significant bleeding and acute encephalopathy. CT revealed L frontal scalp laceration and subgaleal hematoma.  PMH significant for atrial fibrillation, mechanical mitral valve, and essential hypertension.    Clinical Impression   PTA, pt was independent with ADL and functional mobility. She lives alone, drives, and volunteers regularly. Pt currently presents with decreased awareness, decreased problem solving skills, and decreased short-term memory. She requires overall supervision for safety with ADL tasks due to significant cognitive deficits. Feel pt will need 24 hour supervision post-acute D/C and would benefit from initial home health OT services to maximize independence and safety with ADL tasks. OT will continue to follow while admitted.      Follow Up Recommendations  Home health OT;Supervision/Assistance - 24 hour    Equipment Recommendations  None recommended by OT    Recommendations for Other Services       Precautions / Restrictions Precautions Precautions: Fall Precaution Comments: Poor short term memory Restrictions Weight Bearing Restrictions: No      Mobility Bed Mobility Overal bed mobility: Needs Assistance Bed Mobility: Supine to Sit     Supine to sit: Supervision     General bed mobility comments: Supervision for safety.   Transfers Overall transfer level: Needs assistance Equipment used: None Transfers: Sit to/from Stand Sit to Stand: Supervision         General transfer comment: Supervision for safety on rise to standing.     Balance Overall balance assessment: Needs assistance Sitting-balance support: No upper extremity supported;Feet supported Sitting balance-Leahy Scale: Good     Standing balance support: No upper extremity  supported;During functional activity Standing balance-Leahy Scale: Fair Standing balance comment: Does reach for furniture when ambulating in hallways.                           ADL either performed or assessed with clinical judgement   ADL Overall ADL's : Needs assistance/impaired Eating/Feeding: Set up;Sitting   Grooming: Supervision/safety;Standing Grooming Details (indicate cue type and reason): VC's for sequencing Upper Body Bathing: Sitting;Set up   Lower Body Bathing: Supervison/ safety;Sit to/from stand   Upper Body Dressing : Set up;Sitting   Lower Body Dressing: Supervision/safety;Sit to/from stand   Toilet Transfer: Supervision/safety;Ambulation;Regular Toilet   Toileting- Water quality scientist and Hygiene: Supervision/safety;Sit to/from stand       Functional mobility during ADLs: Supervision/safety General ADL Comments: Pt requires supervision for safety with ADL and functional mobiltiy due to cognitive deficits. Requires cues for sequencing and progression of tasks.      Vision Baseline Vision/History: Wears glasses Wears Glasses: At all times Patient Visual Report: No change from baseline(does not have glasses present) Vision Assessment?: Vision impaired- to be further tested in functional context Additional Comments: Does not have glasses present. Reports this is her baseline vision when she does not have her glasses.      Perception     Praxis      Pertinent Vitals/Pain Pain Assessment: No/denies pain     Hand Dominance     Extremity/Trunk Assessment Upper Extremity Assessment Upper Extremity Assessment: Overall WFL for tasks assessed   Lower Extremity Assessment Lower Extremity Assessment: Overall WFL for tasks assessed       Communication Communication Communication: No difficulties   Cognition Arousal/Alertness: Awake/alert Behavior During Therapy: WFL for  tasks assessed/performed Overall Cognitive Status: Impaired/Different  from baseline Area of Impairment: Attention;Memory;Safety/judgement;Awareness;Problem solving;Orientation                 Orientation Level: Disoriented to;Time(knows the month but not date) Current Attention Level: Selective Memory: Decreased short-term memory   Safety/Judgement: Decreased awareness of safety Awareness: Emergent Problem Solving: Slow processing;Difficulty sequencing General Comments: Pt repeats herself frequently with little recall of education. Pt with decreased ability to problem solve and with decreased executive functioning noted.    General Comments       Exercises     Shoulder Instructions      Home Living Family/patient expects to be discharged to:: Private residence Living Arrangements: Alone Available Help at Discharge: Friend(s) Type of Home: House(townhome) Home Access: Stairs to enter [employer] of Steps: 1   Home Layout: One level     Bathroom Shower/Tub: Occupational psychologist: Standard Bathroom Accessibility: Yes   Home Equipment: Cane - single point          Prior Functioning/Environment Level of Independence: Independent                 OT Problem List: Decreased activity tolerance;Impaired balance (sitting and/or standing);Decreased strength;Decreased cognition;Decreased safety awareness;Decreased knowledge of precautions      OT Treatment/Interventions: Self-care/ADL training;Therapeutic exercise;Energy conservation;DME and/or AE instruction;Therapeutic activities;Patient/family education;Balance training    OT Goals(Current goals can be found in the care plan section) Acute Rehab OT Goals Patient Stated Goal: to get the dried blood off of her hair OT Goal Formulation: With patient Time For Goal Achievement: 05/19/17 Potential to Achieve Goals: Good ADL Goals Pt Will Perform Grooming: with modified independence;standing(3 consecutive tasks; no VCs for sequencing) Pt Will Transfer to Toilet:  Independently;ambulating;regular height toilet Pt Will Perform Toileting - Clothing Manipulation and hygiene: Independently;sit to/from stand Additional ADL Goal #1: Pt will demonstrate improved executive functioning skills to complete medication management task independently. Additional ADL Goal #2: Pt wil utilize 2 external memory aids to complete financial management tasks.  OT Frequency: Min 2X/week   Barriers to D/C:            Co-evaluation              AM-PAC PT "6 Clicks" Daily Activity     Outcome Measure Help from another person eating meals?: A Little Help from another person taking care of personal grooming?: A Little Help from another person toileting, which includes using toliet, bedpan, or urinal?: A Little Help from another person bathing (including washing, rinsing, drying)?: A Little Help from another person to put on and taking off regular upper body clothing?: A Little Help from another person to put on and taking off regular lower body clothing?: A Little 6 Click Score: 18   End of Session Equipment Utilized During Treatment: Gait belt Nurse Communication: Mobility status  Activity Tolerance: Patient tolerated treatment well Patient left: in chair;with call bell/phone within reach;with chair alarm set  OT Visit Diagnosis: Other symptoms and signs involving cognitive function;Unsteadiness on feet (R26.81)                Time: 9509-3267 OT Time Calculation (min): 25 min Charges:  OT General Charges $OT Visit: 1 Visit OT Evaluation $OT Eval Moderate Complexity: 1 Mod OT Treatments $Self Care/Home Management : 8-22 mins G-Codes: OT G-codes **NOT FOR INPATIENT CLASS** Functional Assessment Tool Used: Clinical judgement Functional Limitation: Self care Self Care Current Status (T2458): At least 1 percent but less than  20 percent impaired, limited or restricted Self Care Goal Status (F2902): 0 percent impaired, limited or restricted   Norman Herrlich,  MS OTR/L  Pager: [place] 05/05/2017, 1:55 PM

## 2017-05-05 NOTE — Care Management Obs Status (Signed)
[place] NOTIFICATION   Patient Details  Name: LATICA HOHMANN MRN: [id number] Date of Birth: 13-Jun-1940   Medicare Observation Status Notification Given:  Yes    Carles Collet, RN 05/05/2017, 3:13 PM

## 2017-05-05 NOTE — Progress Notes (Signed)
Pt continues to be agitated.  Rozerem PO given per orders for sleep.  Pt c/o being woken up by "that workman on his cell phone."  This RN outside patients room prior to her calling for assistance and no man was noted outside room.  Pt offered ear plugs to decrease noise.  Pt refusing ear plugs.  Pt states "I don't wear them well and as a patient I shouldn't have to wear them.  This is a [facility] hospital and there should not be any noise at night.  I keep getting alarms going off.  My priority is not being monitored, it is to get sleep."  This RN decreased volume on bedside monitor, pulled curtain to decrease light as pt complaining of "the light outside my door," and per MD request decreased telemetry alarms. Multiple attempts to decrease stimuli for patient sleeping.  Pt continues to be dissatisfied regarding staff attempts to decrease noise.  This RN reinforced education regarding need for monitor.  Pt. Still continues to state "I don't care, I only want to sleep and I need more sleeping pills."

## 2017-05-05 NOTE — Evaluation (Addendum)
Physical Therapy Evaluation Patient Details Name: Jodi Frank MRN: [id number] DOB: [birth year] Today's Date: 05/05/2017   History of Present Illness  Pt is a 76 y.o. female admitted with head trauma with significant bleeding and acute encephalopathy. CT revealed L frontal scalp laceration and subgaleal hematoma.  PMH significant for atrial fibrillation, mechanical mitral valve, and essential hypertension.     Clinical Impression  Pt presents with decreased memory, decreased problem solving, and an overall decrease in functional mobility secondary to above. PTA, pt lives alone and indep with mobility. Today, required supervision for mobility due to apparent cognitive deficits. DGI 19/24 indicates increased risk for falls. Feel pt will require 24/7 supervision for safety upon d/c home, in addition to HHPT services to maximize functional mobility and address higher level balance due to increased fall risk. Will continue to follow acutely to address established goals.    Follow Up Recommendations Home health PT;Supervision/Assistance - 24 hour    Equipment Recommendations  None recommended by PT    Recommendations for Other Services       Precautions / Restrictions Precautions Precautions: Fall Precaution Comments: Poor short term memory Restrictions Weight Bearing Restrictions: No      Mobility  Bed Mobility Overal bed mobility: Needs Assistance Bed Mobility: Supine to Sit     Supine to sit: Supervision     General bed mobility comments: Supervision for safety.   Transfers Overall transfer level: Needs assistance Equipment used: None Transfers: Sit to/from Stand Sit to Stand: Supervision         General transfer comment: Supervision for safety on rise to standing.   Ambulation/Gait Ambulation/Gait assistance: Supervision Ambulation Distance (Feet): 300 Feet Assistive device: None Gait Pattern/deviations: Step-through pattern;Drifts right/left Gait velocity:  Decreased Gait velocity interpretation: <1.8 ft/sec, indicative of risk for recurrent falls General Gait Details: Intermittent instability and 1x self-correct LOB, other than this, amb with no AD and supervision for safety. Pt reports she normally wears glasses which she does not have hospital.   Stairs            Wheelchair Mobility    Modified Rankin (Stroke Patients Only)       Balance Overall balance assessment: Needs assistance Sitting-balance support: No upper extremity supported;Feet supported Sitting balance-Leahy Scale: Good     Standing balance support: No upper extremity supported;During functional activity Standing balance-Leahy Scale: Fair Standing balance comment: Does reach for furniture when ambulating in hallways.                 Standardized Balance Assessment Standardized Balance Assessment : Dynamic Gait Index   Dynamic Gait Index Level Surface: Mild Impairment Change in Gait Speed: Mild Impairment Gait with Horizontal Head Turns: Normal Gait with Vertical Head Turns: Normal Gait and Pivot Turn: Normal Step Over Obstacle: Mild Impairment Step Around Obstacles: Mild Impairment Steps: Mild Impairment Total Score: 19       Pertinent Vitals/Pain Pain Assessment: No/denies pain    Home Living Family/patient expects to be discharged to:: Private residence Living Arrangements: Alone Available Help at Discharge: Friend(s) Type of Home: House Home Access: Stairs to enter   Technical brewer of Steps: 1 Home Layout: One level Home Equipment: Cane - single point      Prior Function Level of Independence: Independent               Hand Dominance        Extremity/Trunk Assessment   Upper Extremity Assessment Upper Extremity Assessment: Overall WFL for tasks assessed  Lower Extremity Assessment Lower Extremity Assessment: Overall WFL for tasks assessed       Communication   Communication: No difficulties   Cognition Arousal/Alertness: Awake/alert Behavior During Therapy: WFL for tasks assessed/performed Overall Cognitive Status: Impaired/Different from baseline Area of Impairment: Attention;Memory;Safety/judgement;Awareness;Problem solving;Orientation                 Orientation Level: Disoriented to;Time Current Attention Level: Selective Memory: Decreased short-term memory   Safety/Judgement: Decreased awareness of safety Awareness: Emergent Problem Solving: Slow processing;Difficulty sequencing General Comments: Pt repeats herself frequently with little recall of education. Pt with decreased ability to problem solve and with decreased executive functioning noted.       General Comments General comments (skin integrity, edema, etc.): VSS. Left pt with warm hand soak and washcloth in attempt to get dried blood off fingernails (RN aware)    Exercises     Assessment/Plan    PT Assessment Patient needs continued PT services  PT Problem List Decreased balance;Decreased mobility;Decreased safety awareness;Decreased cognition       PT Treatment Interventions DME instruction;Gait training;Stair training;Functional mobility training;Therapeutic activities;Therapeutic exercise;Patient/family education    PT Goals (Current goals can be found in the Care Plan section)  Acute Rehab PT Goals Patient Stated Goal: Get rest of blood out of hair and from beneath nails PT Goal Formulation: With patient Time For Goal Achievement: 05/19/17 Potential to Achieve Goals: Good    Frequency Min 3X/week   Barriers to discharge Decreased caregiver support      Co-evaluation               AM-PAC PT "6 Clicks" Daily Activity  Outcome Measure Difficulty turning over in bed (including adjusting bedclothes, sheets and blankets)?: None Difficulty moving from lying on back to sitting on the side of the bed? : None Difficulty sitting down on and standing up from a chair with arms (e.g.,  wheelchair, bedside commode, etc,.)?: A Little Help needed moving to and from a bed to chair (including a wheelchair)?: A Little Help needed walking in hospital room?: A Little Help needed climbing 3-5 steps with a railing? : A Little 6 Click Score: 20    End of Session Equipment Utilized During Treatment: Gait belt Activity Tolerance: Patient tolerated treatment well Patient left: in bed;with call bell/phone within reach Nurse Communication: Mobility status PT Visit Diagnosis: Other abnormalities of gait and mobility (R26.89)    Time: 5465-0354 PT Time Calculation (min) (ACUTE ONLY): 24 min   Charges:   PT Evaluation $PT Eval Moderate Complexity: 1 Mod PT Treatments $Gait Training: 8-22 mins   PT G Codes:   PT G-Codes **NOT FOR INPATIENT CLASS** Functional Assessment Tool Used: AM-PAC 6 Clicks Basic Mobility Functional Limitation: Mobility: Walking and moving around Mobility: Walking and Moving Around Current Status (S5681): At least 20 percent but less than 40 percent impaired, limited or restricted Mobility: Walking and Moving Around Goal Status [phone]): At least 1 percent but less than 20 percent impaired, limited or restricted   Mabeline Caras, PT, DPT Acute Rehab Services  Pager: [place] 05/05/2017, 3:47 PM

## 2017-05-05 NOTE — Progress Notes (Signed)
Pt agitated over care.  C/O difficulty sleeping, peripheral IV "in my way, while I'm trying to sleep" and agitated regarding cardiac monitor. Pt states "I need a sleeping pill."  Noted pt removed telemetry.  Upon entry to room pt cursing at staff and states "I got your attention now."  MD notified and to floor to speak with patient regarding sleeping pill and need for monitoring.

## 2017-05-05 NOTE — Progress Notes (Signed)
   Subjective: Jodi Frank was seen laying in her bed this morning. She stated that she does not have any dizziness, lightheadedness, sob, or chest pain. She expressed that she does no recall what happened to cause her to have a laceration on her head.   Objective:  Vital signs in last 24 hours: Vitals:   05/05/17 0417 05/05/17 0831 05/05/17 1027 05/05/17 1230  BP: (!) 127/57 125/61 125/61 123/69  Pulse: 89 86 78 74  Resp:  (!) 21  17  Temp: 98.7 F (37.1 C) 98.6 F (37 C)  98 F (36.7 C)  TempSrc: Oral Oral  Oral  SpO2: 99% 100%  100%  Weight:      Height:       Physical Exam  Constitutional: She is oriented to person, place, and time. She appears well-developed and well-nourished. No distress.  HENT:  Head: Normocephalic and atraumatic.  Eyes: Conjunctivae are normal. Pupils are equal, round, and reactive to light.  Cardiovascular: Normal rate, normal heart sounds and intact distal pulses.  Irregularly irregular rhythm  Respiratory: Effort normal and breath sounds normal. No respiratory distress. She has no wheezes.  GI: Soft. Bowel sounds are normal. She exhibits no distension. There is no tenderness.  Neurological: She is alert and oriented to person, place, and time.  Skin: She is not diaphoretic.  Psychiatric: She has a normal mood and affect. Her behavior is normal. Judgment and thought content normal.   Assessment/Plan:  Acute Encephalopathy   The patient is reported to have had some encephalopathy prior to admission to the hospital. The patient does not recall what caused her to fall and cause trauma to her head. The patient is on several mediations that are on the beer's list-meclizine and vistaril which should be avoided. Her excess alcohol is likely contributing to her encephalopathy.   -stop vistaril and limit use of meclizine -stop alcohol use- the patient is not open to decreasing the amount of alcohol she drinks. Will need to continue to counsel patient about  this during her hospital stay.  Forehead laceration/Subgaleal hematoma  The patient has a forehead laceration that bled excessively likely due to supra-therapeutic INR. Alcohol is a cyp450 inhibitor which is likely causing the patient's warfarin to not be metabolized and therefore buildup to cause an INR=4.2.   -hold warfarin -limit alcohol use -falls precautions recommended  -q4hr neuro checks -INR goal of 2.5-3.5 due to mechanical mitral valve  Hyponatremia The patient's sodium on admission was 126. 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 The patient has urine sodium of 128 today. Her baseline sodium around 131.   -serum osm=268, urine osmolality=382 and urine sodium=72 were all collected after the patient was given a 2L sodium chloride bolus so they are likely not accurate to diagnose etiology.   -follow up with endocrinology outpatient  Essential Hypertension: The patient's blood pressure over past 24 hrs has ranged 123-125/61-69 -continue metoprolol 50mg  qd and losartan 100mg  daily -holding home hydralzine  Atrial fib with RVR -contiuous cardiac monitoring -echo to evaluate elevated troponin   Dispo: Anticipated discharge in approximately 1-2 days.   Jodi Mage, MD Internal Medicine PGY1 [phone] 05/05/2017, 4:49 PM

## 2017-05-05 NOTE — Progress Notes (Signed)
Notified of patient agitation regarding several aspects of her care and comfort including not having her home vistaril ordered. On evaluation, the pt was resting comfortably in bed and expressed frustration regarding her peripheral IV, need cardiac monitoring, difficulty sleeping, uncomfortable bed, lack of explanations, etc. The reasons behind her cardiac monitoring (atrial fibrillation, elevated troponin, fall) and the reasons to avoid over-sedation (head injury in setting of supra-therapeutic INR) including several of her home medications and monitoring her neurologic status. On chart review, she is not currently receiving IV medications, nursing noted she is not a difficult to access. Will dc peripheral IV for now, as well as give dose of melatonin agonist. This will hopefully appeal to the pt with a lower risk of over-sedation that may interfere with accurate neurologic assessment. She agreed to continue necessary elements of her care such as cardiac monitoring following our discussion.

## 2017-05-06 ENCOUNTER — Inpatient Hospital Stay (HOSPITAL_COMMUNITY): Payer: Medicare Other

## 2017-05-06 DIAGNOSIS — I700 Atherosclerosis of aorta: Secondary | ICD-10-CM | POA: Diagnosis present

## 2017-05-06 DIAGNOSIS — Z952 Presence of prosthetic heart valve: Secondary | ICD-10-CM

## 2017-05-06 DIAGNOSIS — Z9889 Other specified postprocedural states: Secondary | ICD-10-CM

## 2017-05-06 DIAGNOSIS — E222 Syndrome of inappropriate secretion of antidiuretic hormone: Secondary | ICD-10-CM | POA: Diagnosis present

## 2017-05-06 DIAGNOSIS — G3184 Mild cognitive impairment, so stated: Secondary | ICD-10-CM | POA: Diagnosis present

## 2017-05-06 DIAGNOSIS — D62 Acute posthemorrhagic anemia: Secondary | ICD-10-CM

## 2017-05-06 DIAGNOSIS — Z7289 Other problems related to lifestyle: Secondary | ICD-10-CM

## 2017-05-06 LAB — PROTIME-INR
INR: 2.16
PROTHROMBIN TIME: 23.9 s — AB (ref 11.4–15.2)

## 2017-05-06 LAB — CBC WITH DIFFERENTIAL/PLATELET
BASOS ABS: 0 10*3/uL (ref 0.0–0.1)
BASOS PCT: 0 %
EOS ABS: 0.1 10*3/uL (ref 0.0–0.7)
Eosinophils Relative: 0 %
HEMATOCRIT: 19.9 % — AB (ref 36.0–46.0)
Hemoglobin: 6.9 g/dL — CL (ref 12.0–15.0)
Lymphocytes Relative: 19 %
Lymphs Abs: 2.2 10*3/uL (ref 0.7–4.0)
MCH: 29.7 pg (ref 26.0–34.0)
MCHC: 34.7 g/dL (ref 30.0–36.0)
MCV: 85.8 fL (ref 78.0–100.0)
MONO ABS: 1 10*3/uL (ref 0.1–1.0)
MONOS PCT: 9 %
NEUTROS ABS: 8.4 10*3/uL — AB (ref 1.7–7.7)
NEUTROS PCT: 72 %
Platelets: 240 10*3/uL (ref 150–400)
RBC: 2.32 MIL/uL — ABNORMAL LOW (ref 3.87–5.11)
RDW: 14.5 % (ref 11.5–15.5)
WBC: 11.7 10*3/uL — ABNORMAL HIGH (ref 4.0–10.5)

## 2017-05-06 LAB — BASIC METABOLIC PANEL
ANION GAP: 7 (ref 5–15)
BUN: 9 mg/dL (ref 6–20)
CALCIUM: 8.1 mg/dL — AB (ref 8.9–10.3)
CHLORIDE: 102 mmol/L (ref 101–111)
CO2: 22 mmol/L (ref 22–32)
Creatinine, Ser: 0.65 mg/dL (ref 0.44–1.00)
GFR calc non Af Amer: >60 mL/min (ref >60)
GLUCOSE: 106 mg/dL — AB (ref 65–99)
POTASSIUM: 3.9 mmol/L (ref 3.5–5.1)
Sodium: 131 mmol/L — ABNORMAL LOW (ref 135–145)

## 2017-05-06 LAB — PREPARE RBC (CROSSMATCH)

## 2017-05-06 MED ORDER — SODIUM CHLORIDE 0.9 % IV SOLN: Freq: Once | INTRAVENOUS | Status: DC

## 2017-05-06 MED ORDER — WARFARIN SODIUM 2.5 MG PO TABS
2.5000 mg | ORAL_TABLET | Freq: Once | ORAL | Status: AC
  Administered 2017-05-06: 2.5 mg via ORAL
  Filled 2017-05-06: qty 1

## 2017-05-06 MED ORDER — WARFARIN - PHARMACIST DOSING INPATIENT
Freq: Every day | Status: DC
  Administered 2017-05-07 – 2017-05-08 (×2)

## 2017-05-06 MED ORDER — SODIUM CHLORIDE 0.9 % IV SOLN
Freq: Once | INTRAVENOUS | Status: AC
  Administered 2017-05-06: 16:00:00 via INTRAVENOUS

## 2017-05-06 NOTE — Progress Notes (Addendum)
   Subjective: Jodi Frank was seen laying in her bed this morning and states that she is doing well. She does not have any lightheadedness, dizziness, sob, or chest pain. The patient's daughter was at bedside and the plan was discussed at length with her. The patient's daughter stated that she would like for the patient to go to a rehab facility at least for short duration.   Objective:  Vital signs in last 24 hours: Vitals:   05/05/17 1230 05/05/17 1656 05/05/17 2005 05/06/17 0015  BP: 123/69 (!) 135/54 (!) 142/52 136/61  Pulse: 74     Resp: 17 12 11  (!) 21  Temp: 98 F (36.7 C) 98.5 F (36.9 C) 98.6 F (37 C) 98.4 F (36.9 C)  TempSrc: Oral Oral Oral Oral  SpO2: 100%  96%   Weight:      Height:       Physical Exam  Constitutional: She appears well-developed and well-nourished. No distress.  HENT:  Head: Normocephalic and atraumatic.  Eyes: Conjunctivae are normal.  Cardiovascular: Normal rate, regular rhythm and normal heart sounds.  Respiratory: Effort normal and breath sounds normal. No respiratory distress. She has no wheezes.  GI: Soft. Bowel sounds are normal. She exhibits no distension. There is no tenderness.  Neurological: She is alert.  Skin: She is not diaphoretic.  Psychiatric: She has a normal mood and affect. Her behavior is normal. Judgment and thought content normal.     Assessment/Plan:  Principal Problem:   Fall Active Problems:   Long term (current) use of anticoagulants   Encephalopathy acute   Pressure injury of skin   Scalp laceration   Acute encephalopathy  Acute Encephalopathy  -Decrease use of alcohol or limit to maximum of 1 glass of wine daily -Stop hydroxyzine -Stop meclizine and re-evaluate dizziness as it occurs  -Considering rehab facility  Supratherapeutic INR A supra-therapeutic INR of 4.2 was present on admission. This is likley due to the patient drinking excessive amount of alcohol and changing her diet acutely to include  more green vegetables.  -hold warfarin -limit alcohol use due to it being a cyp450 inhibitor and therefore increasing warfarin buildup in the body -Instructed to maintain a regular diet without any acute changes -falls precautions recommended  -q4hr neuro checks -INR goal of 2.5-3.5 due to mechanical mitral valve  Acute blood loss anemia The patient's hb was 6.9 this morning.  -Transfused 1 unit -post transfusion hb pending  Hyponatremia Sodium this morning was 131 likely secondary to SIADH due to hypovolemic hyponatremia with urine osm of 382 and urine sodium=72.   -Continue fluid restriction -Follow up with endocrinology outpatient  Essential Hypertension: The patient's blood pressure over past 24 hrs has ranged 123-142/52-69  -continue metoprolol 50mg  qd and losartan 100mg  daily -holding home hydralzine  Atrial fib with RVR  -Echocardiogram showed 65-70% ef, no wall motion abnormalities, severely dilated left atrium, moderately dilated right atrium,  -continue metoprolol 50mg  qd    Dispo: Anticipated discharge in approximately 1 day.  Lars Mage, MD Internal Medicine PGY1 [phone] 05/06/2017, 8:40 AM
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
## 2017-05-06 NOTE — Progress Notes (Signed)
ANTICOAGULATION CONSULT NOTE - Initial Consult  Pharmacy Consult for Warfarin Indication: mechanical heart valve  Allergies  Allergen Reactions  . Carvedilol Itching  . Ace Inhibitors Other (See Comments)    Reaction (??)  . Amlodipine Swelling  . Hydrochlorothiazide Other (See Comments)    Hyponatremia     Patient Measurements: Height: 5\' 1"  (154.9 cm) Weight: 156 lb (70.8 kg) IBW/kg (Calculated) : 47.8  Vital Signs:    Labs: Recent Labs    05/04/17 1319 05/04/17 1656 05/04/17 1721 05/05/17 0037 05/05/17 0620 05/05/17 1211 05/06/17 0619 05/06/17 0852  HGB 10.8*  --   --   --   --   --   --  6.9*  HCT 31.7*  --   --   --   --   --   --  19.9*  PLT 341  --   --   --   --   --   --  240  LABPROT 40.6*  --   --   --  40.6*  --  23.9*  --   INR 4.26*  --   --   --  4.29*  --  2.16  --   CREATININE 1.63*  --  1.37*  --  0.83 0.78 0.65  --   CKTOTAL  --  128  --   --   --   --   --   --   CKMB  --  5.3*  --   --   --   --   --   --   TROPONINI 0.08*  --  0.17* 0.32* 0.24*  --   --   --     Estimated Creatinine Clearance: 55.5 mL/min (by C-G formula based on SCr of 0.65 mg/dL).   Medical History: Past Medical History:  Diagnosis Date  . Broken wrist   . Depression   . Diarrhea   . High cholesterol   . Hypertension   . Memory difficulty 01/18/2016  . 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 Vertigo 01/18/2016    Assessment: 76 year old female on warfarin prior to admission for mitral valve replacement INR goal of 2.5 to 3.5 Dose prior to admission is 5 mg po daily with admit INR = 4.29  Goal of Therapy:  INR = 2.5 to 3.5 Monitor platelets by anticoagulation protocol: Yes   Plan:  Warfarin 2.5 mg po x 1 dose at 1800 pm  Daily INR  Thank you Anette Guarneri, PharmD [phone]  05/06/2017,3:10 PM

## 2017-05-06 NOTE — Progress Notes (Signed)
Re-evaluated pt for concern of continued irritation and potential auditory hallucinations as pt is fixated on various noises she has heard, also continues to complain of bed comfort. She is neurologically appropriate and fully oriented making acute delirium less likely. She does have an alcohol history though objectively, her vital signs and exam are reassuring for alcohol withdrawal. Given the concern for sedating medications and reassuring clinical appearance, will hold off on further withdrawal tx. Nursing has implemented several measures to increase pt comfort and decrease stimulation, though effectiveness is limited by pt's room location on a busy unit. Will delay routine lab draws and further vitals/neuro checks until later in morning to promote period of sleep.

## 2017-05-06 NOTE — Progress Notes (Signed)
Started unit of blood on patient, see flowsheet. Pt report called to 6N RN, daughter is at bedside and will accompany at time of transfer. Transferring patient post 15 minute vitals of blood. No signs of blood reaction at this time. Will continue to monitor until transfer.

## 2017-05-06 NOTE — Progress Notes (Signed)
OT Cancellation Note  Patient Details Name: Jodi Frank MRN: [id number] DOB: February 22, 1941   Cancelled Treatment:    Reason Eval/Treat Not Completed: Patient declined, no reason specified. Pt declined to participate in OT session stating she is "trying to sleep." Will check back as able.  Norman Herrlich, MS OTR/L  Pager: Allyn A Cherylin Waguespack 05/06/2017, 11:58 AM

## 2017-05-06 NOTE — Progress Notes (Addendum)
CRITICAL VALUE ALERT  Critical Value:  6.9 Hgb (redraw from previous 6.5)  Date & Time Notied:  9:35am   Provider Notified: Provider at bedside  Orders Received/Actions taken: No orders at this time.

## 2017-05-07 DIAGNOSIS — X58XXXA Exposure to other specified factors, initial encounter: Secondary | ICD-10-CM

## 2017-05-07 DIAGNOSIS — H5789 Other specified disorders of eye and adnexa: Secondary | ICD-10-CM

## 2017-05-07 DIAGNOSIS — E871 Hypo-osmolality and hyponatremia: Secondary | ICD-10-CM

## 2017-05-07 LAB — BASIC METABOLIC PANEL
Anion gap: 8 (ref 5–15)
BUN: 9 mg/dL (ref 6–20)
CALCIUM: 8.3 mg/dL — AB (ref 8.9–10.3)
CO2: 21 mmol/L — AB (ref 22–32)
CREATININE: 0.65 mg/dL (ref 0.44–1.00)
Chloride: 102 mmol/L (ref 101–111)
GFR calc non Af Amer: >60 mL/min (ref >60)
Glucose, Bld: 111 mg/dL — ABNORMAL HIGH (ref 65–99)
Potassium: 3.5 mmol/L (ref 3.5–5.1)
SODIUM: 131 mmol/L — AB (ref 135–145)

## 2017-05-07 LAB — CBC
HEMATOCRIT: 24.2 % — AB (ref 36.0–46.0)
Hemoglobin: 8.3 g/dL — ABNORMAL LOW (ref 12.0–15.0)
MCH: 29.3 pg (ref 26.0–34.0)
MCHC: 34.3 g/dL (ref 30.0–36.0)
MCV: 85.5 fL (ref 78.0–100.0)
Platelets: 259 10*3/uL (ref 150–400)
RBC: 2.83 MIL/uL — ABNORMAL LOW (ref 3.87–5.11)
RDW: 13.7 % (ref 11.5–15.5)
WBC: 13.7 10*3/uL — ABNORMAL HIGH (ref 4.0–10.5)

## 2017-05-07 LAB — HEMOGLOBIN AND HEMATOCRIT, BLOOD
HCT: 24.2 % — ABNORMAL LOW (ref 36.0–46.0)
Hemoglobin: 8.2 g/dL — ABNORMAL LOW (ref 12.0–15.0)

## 2017-05-07 LAB — PROTIME-INR
INR: 1.67
Prothrombin Time: 19.5 seconds — ABNORMAL HIGH (ref 11.4–15.2)

## 2017-05-07 MED ORDER — HEPARIN (PORCINE) IN NACL 100-0.45 UNIT/ML-% IJ SOLN
1050.0000 [IU]/h | INTRAMUSCULAR | Status: DC
  Filled 2017-05-07: qty 250

## 2017-05-07 MED ORDER — WARFARIN SODIUM 5 MG PO TABS
5.0000 mg | ORAL_TABLET | Freq: Once | ORAL | Status: AC
  Administered 2017-05-07: 5 mg via ORAL
  Filled 2017-05-07: qty 1

## 2017-05-07 MED ORDER — ENOXAPARIN SODIUM 80 MG/0.8ML ~~LOC~~ SOLN
1.0000 mg/kg | Freq: Two times a day (BID) | SUBCUTANEOUS | Status: DC
  Administered 2017-05-07 – 2017-05-09 (×5): 70 mg via SUBCUTANEOUS
  Filled 2017-05-07 (×5): qty 0.8

## 2017-05-07 NOTE — Progress Notes (Addendum)
[place] for Warfarin/Lovenox Indication: mechanical heart valve, afib  Allergies  Allergen Reactions  . Carvedilol Itching  . Ace Inhibitors Other (See Comments)    Reaction (??)  . Amlodipine Swelling  . Hydrochlorothiazide Other (See Comments)    Hyponatremia     Patient Measurements: Height: 5\' 1"  (154.9 cm) Weight: 156 lb (70.8 kg) IBW/kg (Calculated) : 47.8  Heparin dosing wt: 63.1 kg  Vital Signs:    Labs: Recent Labs    05/04/17 1319 05/04/17 1656 05/04/17 1721 05/05/17 0037 05/05/17 0620 05/05/17 1211 05/06/17 0619 05/06/17 0852 05/07/17 0032 05/07/17 0645  HGB 10.8*  --   --   --   --   --   --  6.9* 8.2* 8.3*  HCT 31.7*  --   --   --   --   --   --  19.9* 24.2* 24.2*  PLT 341  --   --   --   --   --   --  240  --  259  LABPROT 40.6*  --   --   --  40.6*  --  23.9*  --   --  19.5*  INR 4.26*  --   --   --  4.29*  --  2.16  --   --  1.67  CREATININE 1.63*  --  1.37*  --  0.83 0.78 0.65  --   --  0.65  CKTOTAL  --  128  --   --   --   --   --   --   --   --   CKMB  --  5.3*  --   --   --   --   --   --   --   --   TROPONINI 0.08*  --  0.17* 0.32* 0.24*  --   --   --   --   --     Estimated Creatinine Clearance: 55.5 mL/min (by C-G formula based on SCr of 0.65 mg/dL).   Medical History: Past Medical History:  Diagnosis Date  . Broken wrist   . Depression   . Diarrhea   . High cholesterol   . Hypertension   . Memory difficulty 01/18/2016  . 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 Vertigo 01/18/2016    Assessment: 76 year old female on warfarin for prior to admission for afib, mechanical MVR. Pharmacy consulted to dose warfarin inpatient, initially held for INR 4.26 in setting of forehead laceration/subgaleal hematoma. No active bleeding, CT abd/pelvis neg for bleeding. S/p 1u PRBC on 12/12 - Hg improved, plt wnl.  Warfarin resumed 12/12. INR down to 1.67 Dose prior to admission is 5 mg po daily, last  dose 12/10 PTA  Goal of Therapy:  INR = 2.5 to 3.5 Monitor platelets by anticoagulation protocol: Yes   Plan:  Warfarin 5 mg PO x 1 Daily INR Monitor CBC, s/sx bleeding closely Need to bridge? MD to f/u with plans after rounds   Elicia Lamp, PharmD, BCPS Clinical Pharmacist Clinical phone for 05/07/2017 until 3:30pm: [phone] If after 3:30pm, please call main pharmacy at: [phone] 05/07/2017 8:19 AM   ADDENDUM:  Pharmacy consulted to add Lovenox bridge (heparin consult d/c'd prior to med being started) while INR subtherapeutic. SCr stable wnl. No active bleeding noted  Goal of Therapy INR 2.5-3.5 Anti-Xa level 0.6-1 units/ml 4hrs after LMWH dose given  Plan: Lovenox 70mg  (~1mg /kg) [place] q12h while subtherapeutic Daily INR, CBC  at least q72h while on Lovenox Monitor closely for s/sx bleeding   Elicia Lamp, PharmD, BCPS Clinical Pharmacist 05/07/2017 10:46 AM

## 2017-05-07 NOTE — Progress Notes (Signed)
Physical Therapy Treatment Patient Details Name: Jodi Frank MRN: [id number] DOB: [birth year] Today's Date: 05/07/2017    History of Present Illness Pt is a 76 y.o. female admitted with head trauma with significant bleeding and acute encephalopathy. CT revealed L frontal scalp laceration and subgaleal hematoma.  PMH significant for atrial fibrillation, mechanical mitral valve, and essential hypertension.     PT Comments    Pt improving, but still unsteady at times and slowed of processing/looking to daughter.  Pt would best be managed at SNF short-term.  Emphasis today on gait stability and stamina.    Follow Up Recommendations  SNF;Other (comment)(due to pt's instabilities and no one to stay with her.)     Equipment Recommendations  None recommended by PT    Recommendations for Other Services       Precautions / Restrictions Precautions Precautions: Fall Precaution Comments: Poor short term memory    Mobility  Bed Mobility               General bed mobility comments: OOB on arrival  Transfers Overall transfer level: Needs assistance Equipment used: None Transfers: Sit to/from Stand Sit to Stand: Supervision         General transfer comment: for safety  Ambulation/Gait Ambulation/Gait assistance: Supervision Ambulation Distance (Feet): 300 Feet Assistive device: None(occasional rail) Gait Pattern/deviations: Step-through pattern Gait velocity: pt able to increase speed significantly, but prefers slower. Gait velocity interpretation: at or above normal speed for age/gender General Gait Details: generally stable with occasional use of rail for mild intermittent instability when pt scans   Stairs Stairs: Yes       General stair comments: Once in the vicinity of the stairs, pt deferred due to it was too cold.  Wheelchair Mobility    Modified Rankin (Stroke Patients Only)       Balance Overall balance assessment: Needs assistance   Sitting  balance-Leahy Scale: Good     Standing balance support: No upper extremity supported;During functional activity Standing balance-Leahy Scale: Fair                              Cognition Arousal/Alertness: Awake/alert Behavior During Therapy: WFL for tasks assessed/performed Overall Cognitive Status: Impaired/Different from baseline Area of Impairment: Attention;Memory;Safety/judgement;Orientation                 Orientation Level: Disoriented to;Time Current Attention Level: Selective Memory: Decreased short-term memory   Safety/Judgement: Decreased awareness of safety;Decreased awareness of deficits            Exercises      General Comments General comments (skin integrity, edema, etc.): VSS      Pertinent Vitals/Pain Pain Assessment: No/denies pain    Home Living                      Prior Function            PT Goals (current goals can now be found in the care plan section) Acute Rehab PT Goals Patient Stated Goal: Get rest of blood out of hair and from beneath nails PT Goal Formulation: With patient Time For Goal Achievement: 05/19/17 Potential to Achieve Goals: Good Progress towards PT goals: Progressing toward goals    Frequency    Min 3X/week      PT Plan Current plan remains appropriate    Co-evaluation              AM-PAC PT "  6 Clicks" Daily Activity  Outcome Measure  Difficulty turning over in bed (including adjusting bedclothes, sheets and blankets)?: None Difficulty moving from lying on back to sitting on the side of the bed? : None Difficulty sitting down on and standing up from a chair with arms (e.g., wheelchair, bedside commode, etc,.)?: A Little Help needed moving to and from a bed to chair (including a wheelchair)?: A Little Help needed walking in hospital room?: A Little Help needed climbing 3-5 steps with a railing? : A Little 6 Click Score: 20    End of Session   Activity Tolerance: Patient  tolerated treatment well Patient left: in chair;with call bell/phone within reach Nurse Communication: Mobility status PT Visit Diagnosis: Other abnormalities of gait and mobility (R26.89)     Time: 8757-9728 PT Time Calculation (min) (ACUTE ONLY): 20 min  Charges:  $Gait Training: 8-22 mins                    G Codes:       05-21-2017  Donnella Sham, PT [phone]  (pager)   Tessie Fass Alexio Sroka 05-21-2017, 5:13 PM

## 2017-05-07 NOTE — Consult Note (Addendum)
   [facility] CM Inpatient Consult   05/07/2017  Jodi Frank 1940-08-10 [id number]     Received call from [facility] with [facility] First to request that writer cross check for [facility] eligibility in order to be eligible for the [place] program.  Made Tommi Rumps aware that patient is not eligible for [facility] Care Management Services or [place] at this time.  Reason:  Not a beneficiary currently attributed to one of the [place].  Membership roster (All Payers List) used to verify non- eligible status.   Cory to follow up with inpatient RNCM regarding patient not being eligible for [facility] Liaison [phone]

## 2017-05-07 NOTE — Progress Notes (Signed)
   Subjective: [name] was seen doing well this morning. She denies any dizziness or lightheadedness. The patient stated that she feels that she has some tightness in her upper abdomen.   Objective:  Vital signs in last 24 hours: Vitals:   05/06/17 1610 05/06/17 1630 05/06/17 1717 05/06/17 1950  BP:  121/72 (!) 129/49 (!) 154/52  Pulse:    73  Resp: 17 17  18   Temp: 97.6 F (36.4 C) 98.3 F (36.8 C) 98 F (36.7 C) 98.5 F (36.9 C)  TempSrc: Oral Oral Oral Oral  SpO2:    99%  Weight:      Height:       Physical Exam  Constitutional: She appears well-developed and well-nourished. No distress.  Eyes: Conjunctivae are normal.  Cardiovascular: Normal rate, regular rhythm, normal heart sounds and intact distal pulses.  Respiratory: Effort normal and breath sounds normal. No respiratory distress. She has no wheezes.  GI: Soft. Bowel sounds are normal. She exhibits no distension. There is no tenderness.  Neurological: She is alert.  Skin: She is not diaphoretic.  Racoon eyes, 6cm laceration on forehead, areas of purpura on bilateral upper extremities.  Psychiatric: She has a normal mood and affect. Her behavior is normal. Judgment and thought content normal.    Assessment/Plan:  Acute blood loss anemia The patient's hb was 6.9 yesterday 05/06/17 and the patient was transfused 1 prbc. The patient's post transfusion hb was 8.3. The patient's blood loss was likely due to excessive bleeding form the patient's INR being supratherapeutic.   Anticoagulation for mechanical mitral valve Goal INR= 1.5-2.5 The patient's INR today was 1.67.   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   -Continue fluid restriction  Essential Hypertension: The patient's blood pressureover past 24 hrshas ranged 121-154/52-72. At home,  the patient is on hydralazine 25mg  qd, losartan 100mg  daily, metoprolol 50mg  daily  -I believe that the patient maybe confusing her hydralazine with her hydroxyzine as she states that she takes her blood pressure medication 3 times daily  Which maybe contributing to the patient's dizziness at home. Losartan 100mg  daily, metoprolol 50mg  daily.  -orthostatic vital signs pending -continue metoprolol 50mg  qd  -Continue losartan 100mg  daily -holding home hydralzine  Dispo: Anticipated discharge in approximately 1-2 day(s).   Lars Mage, MD Internal Medicine PGY1 Pager:[phone] 05/07/2017, 8:53 AM

## 2017-05-07 NOTE — Progress Notes (Signed)
MD Johny Chess came and spoke with patient concerning lab draws. Patient agreed to have labs drawn. Notified phlebotomy via telephone to come and draw labs from patient. Phlebotomy acknowledged.

## 2017-05-07 NOTE — Care Management Note (Addendum)
Case Management Note  Patient Details  Name: Jodi Frank MRN: [id number] Date of Birth: 13-Sep-1940  Subjective/Objective:                    Action/Plan: Discussed discharge planning with patient and [name]  at bedside . Jodi Frank has information provided by SW regarding Assisted Living and will look into that .   Discussed home health list of agencies provided . Discussed [facility] program . If patient qualifies she would have RN,PT,OT,aide and SW at home and receive more visits and possibility aide 6 hours a day for first couple weeks.   Patient needs PCP. Daughter states patient has an appointment at [facility] with a MD but not until March. Daughter and patient knows a MD at  [place] they want. Daughter will call to arrange an appointment. They are aware home health will need PCP MD name.   Daughter stated patient does not have a bed at home . Offered to order hospital bed . Patient declined.  Await to hear back from [place] with [place].  Jodi Frank returned call patient is not eligible for Home First Program, patient and daughter aware .   Patient has appointment with Sloan Leiter at [facility] May 12, 2017 .   Patient and daughter will discuss options tonight and will call NCM in am.   Went to patient's room 20 minutes earlier. [name] was present but on phone, asked to come back. Returned patient stated daughter had to "step out" but will be back shortly. Patient has NCM name and direct cell number and will call when daughter returns.  Expected Discharge Date:  05/07/17               Expected Discharge Plan:  [place]  In-House Referral:     Discharge planning Services     Post Acute Care Choice:    Choice offered to:  Patient  DME Arranged:    DME Agency:     HH Arranged:    [place] Agency:     Status of Service:  In process, will continue to follow  If discussed at Long Length of Stay Meetings, dates discussed:    Additional  Comments:  Jodi Favre, RN 05/07/2017, 3:20 PM

## 2017-05-07 NOTE — Progress Notes (Signed)
Patient refused to have labs drawn. MD Johny Chess notified via telephone.

## 2017-05-07 NOTE — Clinical Social Work Note (Signed)
Clinical Social Work Assessment  Patient Details  Name: Jodi Frank MRN: [id number] Date of Birth: 08/29/1940  Date of referral:  05/07/17               Reason for consult:  Facility Placement, Discharge Planning                Permission sought to share information with:  Family Supports, Customer service manager Permission granted to share information::  Yes, Verbal Permission Granted  Name::     Jodi Frank::     Relationship::  daughter & ex-husband  Contact Information:  [phone]  Housing/Transportation Living arrangements for the past 2 months:  Single Family Home Source of Information:  Patient, Adult Children Patient Interpreter Needed:  None Criminal Activity/Legal Involvement Pertinent to Current Situation/Hospitalization:  No - Comment as needed Significant Relationships:  Adult Children, Community Support, Other Family Members, Friend Lives with:  Self Do you feel safe going back to the place where you live?  Yes Need for family participation in patient care:  Yes (Comment)(Pt needs assistance w/ med management and supervision)  Care giving concerns:  Pt lives alone and pt family & friends have seen further deficits in pt memory. Pt also consumes regular alcohol, which she had before the current hospitalization. Pt family would like pt to have in home support or perhaps transitions to ALF for more 24 hour support.    Social Worker assessment / plan:  CSW met with pt and pt daughter at bedside. Pt very distracted by her discomfort in the bed and blood still in her hair. Pt daughter was very pleasant, and CSW supported pt daughter as she spoke with CSW and pt about concerns regarding pt living alone, her fall, and memory deficits. Pt lives alone in a single family home in [place] and has friends that keep up with her, [name] lives in [place], and pt's ex husband lives in [place]. No family is able to provide 24/7 care. Pt is  ambulating more than 300 ft, and CSW let pt & pt daughter know that SNF was not recommended, but that 24 hr care supervision was suggested by care team.   Pt daughter is concerned that on the night that her mom fell that she does not remember how much she had to drink, what she hit her head on, and that she was alone.Pt daughter then mentioned that they needed to talk about the importance of moderating alcohol intake for safety reasons, although pt continues to state that she only has "a glass of wine a night."  Pt daughter continued to talk to mother with CSW present about different care options that pt daughter felt it important to look into. Pt daughter had to continually refocus pt during CSW assessment.   Pt and pt daughter are going to look into suggestions from MD care team regarding finding a geriatric focused medical practice that could be a central location for care. CSW then supported pt daughter as she spoke about pt's memory deficits and how that may affect her safety. CSW affirmed the importance of having a medical care team speak to pt & pt family about how best to assess any memory concerns, and having supportive care in the home or through an assisted living to ensure pt safety in the future. CSW will provide pt and pt family with information from [employer] [facility] [place], [place] for [employer]. CSW  signing off, please consult if any further needs arise.  Employment status:  Retired Insurance information:  Medicare PT Recommendations:  24 Hour Supervision, Home with Home Health Information / Referral to community resources:  [facility], [facility] list,[facility]   Patient/Family's Response to care:  Pt uncomfortable and easily distracted during care discussion, but centered and refocused by pt daughter. Pt and pt daughter believe some additional support in the home or at an assisted living facility is important and an assessment by  geriatric care team regarding memory loss may be important.   Patient/Family's Understanding of and Emotional Response to Diagnosis, Current Treatment, and Prognosis:  Pt daughter understands diagnosis, current treatment and prognosis, was teary at times, yet did a wonderful job framing her concerns about the future to her mother. Pt less oriented to prognosis, but does understand current diagnosis and treatment. Pt easily distractible, but is in agreement about finding additional support.   Emotional Assessment Appearance:  Appears stated age Attitude/Demeanor/Rapport:  Self-Absorbed(Distracted) Affect (typically observed):  Frustrated, Overwhelmed(Distracted) Orientation:  Oriented to Self, Oriented to Place Alcohol / Substance use:  Alcohol Use(Pt states she drinks "a glass every night") Psych involvement (Current and /or in the community):  No (Comment)  Discharge Needs  Concerns to be addressed:  Discharge Planning Concerns, Care Coordination, Decision making concerns Readmission within the last 30 days:  No Current discharge risk:  Lives alone, Cognitively Impaired Barriers to Discharge:  Continued Medical Work up    H , LCSWA 05/07/2017, 2:04 PM  

## 2017-05-07 NOTE — Progress Notes (Signed)
Occupational Therapy Treatment Patient Details Name: Jodi Frank MRN: [id number] DOB: 1941/02/28 Today's Date: 05/07/2017    History of present illness Pt is a 76 y.o. female admitted with head trauma with significant bleeding and acute encephalopathy. CT revealed L frontal scalp laceration and subgaleal hematoma.  PMH significant for atrial fibrillation, mechanical mitral valve, and essential hypertension.    OT comments  Pt with perseveration on getting blood from her hair, difficult to redirect. Continues to demonstrate decreased memory and awareness of deficits, although sequencing ADL is improved.  Requiring min guard assist due to mild unsteadiness with ambulation. Per chart, pt does not have 24 hour supervision at home. Updated recommendation for ST rehab in SNF. Question pt's ability to manage IADL or respond to emergency situations appropriately at home.   Follow Up Recommendations  SNF;Supervision/Assistance - 24 hour    Equipment Recommendations  None recommended by OT    Recommendations for Other Services      Precautions / Restrictions Precautions Precautions: Fall Precaution Comments: Poor short term memory       Mobility Bed Mobility Overal bed mobility: Modified Independent             General bed mobility comments: bed flat  Transfers Overall transfer level: Needs assistance Equipment used: None Transfers: Sit to/from Stand Sit to Stand: Supervision         General transfer comment: for safety    Balance     Sitting balance-Leahy Scale: Good       Standing balance-Leahy Scale: Fair                             ADL either performed or assessed with clinical judgement   ADL Overall ADL's : Needs assistance/impaired     Grooming: Oral care;Brushing hair;Standing;Supervision/safety Grooming Details (indicate cue type and reason): sequencing without cues             Lower Body Dressing: Set up;Sitting/lateral leans Lower  Body Dressing Details (indicate cue type and reason): crosses foot over opposite knee Toilet Transfer: Min guard;Ambulation   Toileting- Clothing Manipulation and Hygiene: Supervision/safety;Sit to/from stand       Functional mobility during ADLs: Min guard General ADL Comments: pt with some mild unsteadiness with ambulation     Vision       Perception     Praxis      Cognition Arousal/Alertness: Awake/alert Behavior During Therapy: WFL for tasks assessed/performed Overall Cognitive Status: Impaired/Different from baseline Area of Impairment: Attention;Memory;Safety/judgement;Orientation                 Orientation Level: Disoriented to;Time Current Attention Level: Selective Memory: Decreased short-term memory   Safety/Judgement: Decreased awareness of safety;Decreased awareness of deficits              Exercises     Shoulder Instructions       General Comments      Pertinent Vitals/ Pain       Pain Assessment: No/denies pain  Home Living                                          Prior Functioning/Environment              Frequency  Min 2X/week        Progress Toward Goals  OT Goals(current goals can now  be found in the care plan section)  Progress towards OT goals: Progressing toward goals  Acute Rehab OT Goals Patient Stated Goal: Get rest of blood out of hair and from beneath nails OT Goal Formulation: With patient Time For Goal Achievement: 05/19/17 Potential to Achieve Goals: Good  Plan Discharge plan needs to be updated    Co-evaluation                 AM-PAC PT "6 Clicks" Daily Activity     Outcome Measure   Help from another person eating meals?: None Help from another person taking care of personal grooming?: A Little Help from another person toileting, which includes using toliet, bedpan, or urinal?: A Little Help from another person bathing (including washing, rinsing, drying)?: A  Little Help from another person to put on and taking off regular upper body clothing?: None Help from another person to put on and taking off regular lower body clothing?: A Little 6 Click Score: 20    End of Session    OT Visit Diagnosis: Other symptoms and signs involving cognitive function;Unsteadiness on feet (R26.81)   Activity Tolerance Patient tolerated treatment well   Patient Left in bed;with call bell/phone within reach;with nursing/sitter in room   Nurse Communication Other (comment)(may pt take a shower?)        Time: 2947-6546 OT Time Calculation (min): 24 min  Charges: OT General Charges $OT Visit: 1 Visit OT Treatments $Self Care/Home Management : 23-37 mins  05/07/2017 Nestor Lewandowsky, OTR/L Pager: [phone]   Werner Lean Haze Boyden 05/07/2017, 9:51 AM

## 2017-05-07 NOTE — Progress Notes (Signed)
Notified MD Johny Chess via text page that H&H has resulted. Hgb 8.2 and Hct 24.2

## 2017-05-08 LAB — BASIC METABOLIC PANEL
ANION GAP: 7 (ref 5–15)
BUN: 7 mg/dL (ref 6–20)
CHLORIDE: 103 mmol/L (ref 101–111)
CO2: 24 mmol/L (ref 22–32)
Calcium: 8.6 mg/dL — ABNORMAL LOW (ref 8.9–10.3)
Creatinine, Ser: 0.76 mg/dL (ref 0.44–1.00)
GFR calc Af Amer: >60 mL/min (ref >60)
GFR calc non Af Amer: >60 mL/min (ref >60)
GLUCOSE: 100 mg/dL — AB (ref 65–99)
POTASSIUM: 3.4 mmol/L — AB (ref 3.5–5.1)
Sodium: 134 mmol/L — ABNORMAL LOW (ref 135–145)

## 2017-05-08 LAB — CBC
HEMATOCRIT: 24.7 % — AB (ref 36.0–46.0)
HEMOGLOBIN: 8.3 g/dL — AB (ref 12.0–15.0)
MCH: 29.3 pg (ref 26.0–34.0)
MCHC: 33.6 g/dL (ref 30.0–36.0)
MCV: 87.3 fL (ref 78.0–100.0)
Platelets: 270 10*3/uL (ref 150–400)
RBC: 2.83 MIL/uL — ABNORMAL LOW (ref 3.87–5.11)
RDW: 14.2 % (ref 11.5–15.5)
WBC: 9 10*3/uL (ref 4.0–10.5)

## 2017-05-08 LAB — PROTIME-INR
INR: 1.61
Prothrombin Time: 19 seconds — ABNORMAL HIGH (ref 11.4–15.2)

## 2017-05-08 MED ORDER — WARFARIN SODIUM 7.5 MG PO TABS
7.5000 mg | ORAL_TABLET | Freq: Once | ORAL | Status: AC
  Administered 2017-05-08: 7.5 mg via ORAL
  Filled 2017-05-08: qty 1

## 2017-05-08 MED ORDER — CAMPHOR-MENTHOL 0.5-0.5 % EX LOTN
TOPICAL_LOTION | CUTANEOUS | Status: DC | PRN
  Filled 2017-05-08: qty 222

## 2017-05-08 MED ORDER — POTASSIUM CHLORIDE CRYS ER 20 MEQ PO TBCR
40.0000 meq | EXTENDED_RELEASE_TABLET | Freq: Once | ORAL | Status: AC
  Administered 2017-05-08: 40 meq via ORAL
  Filled 2017-05-08: qty 2

## 2017-05-08 NOTE — Progress Notes (Signed)
ANTICOAGULATION CONSULT NOTE - Follow Up Consult  Pharmacy Consult for Lovenox/Coumadin Indication: MVR and afib  Allergies  Allergen Reactions  . Carvedilol Itching  . Ace Inhibitors Other (See Comments)    Reaction (??)  . Amlodipine Swelling  . Hydrochlorothiazide Other (See Comments)    Hyponatremia     Patient Measurements: Height: 5\' 1"  (154.9 cm) Weight: 156 lb 1.4 oz (70.8 kg) IBW/kg (Calculated) : 47.8  Vital Signs: Temp: 98.1 F (36.7 C) (12/13 2027) Temp Source: Oral (12/13 2027) BP: 152/73 (12/14 0500) Pulse Rate: 78 (12/14 0500)  Labs: Recent Labs    05/06/17 0619  05/06/17 0852 05/07/17 0032 05/07/17 0645 05/08/17 0540  HGB  --    < > 6.9* 8.2* 8.3* 8.3*  HCT  --    < > 19.9* 24.2* 24.2* 24.7*  PLT  --   --  240  --  259 270  LABPROT 23.9*  --   --   --  19.5* 19.0*  INR 2.16  --   --   --  1.67 1.61  CREATININE 0.65  --   --   --  0.65 0.76   < > = values in this interval not displayed.    Estimated Creatinine Clearance: 55.5 mL/min (by C-G formula based on SCr of 0.76 mg/dL).  Assessment:  Anticoag: Coumadin PTA for hx AFib + mech MVR >> held for INR 4.26 in setting of forehead laceration/subgaleal hematoma. Enox bridge started 12/13. S/p 1u PRBC 12/12.  Hgb 8.3 stable. Platelets stable. INR 1.61 no change overnight. Coumadin charted last PM.  *home dose: 5mg  daily, last dose 12/10  - MD attending note says "Appears to have plenty of confusion about meds" Patient will likely need discharge to ALF versus SNF. Resident note also says Coumadin goal 1.5-2.5 which I believe is in error 12/13.--Will attempt more Coumadin education with patient/daughter.   Goal of Therapy:  INR 2.5-3.5 Monitor platelets by anticoagulation protocol: Yes   Plan:  Lovenox 70mg  SQ q 12 hrs. Coumadin 7.5mg  po x 1 tonight Daily INR. CBC q 72h   Kenley Troop S. Alford Highland, PharmD, BCPS Clinical Staff Pharmacist Pager [phone]  Eilene Ghazi  Stillinger 05/08/2017,8:14 AM

## 2017-05-08 NOTE — Progress Notes (Addendum)
   Subjective: Jodi Frank was seen doing well this morning she states that she does not have any nausea, vomiting, dizziness, sob, or chest pain.   Objective:  Vital signs in last 24 hours: Vitals:   05/07/17 1557 05/07/17 2027 05/08/17 0500 05/08/17 0941  BP: (!) 139/54 (!) 148/48 (!) 152/73 (!) 159/57  Pulse: 61 72 78 (!) 102  Resp: 18 16 18    Temp: 98.8 F (37.1 C) 98.1 F (36.7 C)    TempSrc: Oral Oral    SpO2: 99% 99% 98%   Weight:      Height:       Physical Exam  Constitutional: She appears well-developed and well-nourished. No distress.  HENT:  Head: Normocephalic and atraumatic.  Eyes: Conjunctivae are normal.  Cardiovascular: Normal rate, regular rhythm, normal heart sounds and intact distal pulses.  Pulmonary/Chest: Effort normal and breath sounds normal. No stridor. No respiratory distress.  Abdominal: Soft. Bowel sounds are normal. She exhibits no distension. There is no tenderness.  Neurological: She is alert.  Skin: She is not diaphoretic.  Psychiatric: She has a normal mood and affect. Her behavior is normal. Judgment and thought content normal.     Assessment/Plan:  Acute blood loss anemia The patient's hb continues to be 8.3 and she is not symptomatic. The patient's acute drop in hb is likely due to her excessive blood lost post fall.  -Morning CBC 05/09/17  Anticoagulation for mechanical mitral valve Goal INR= 1.5-2.5 The patient's INR today was 1.67.   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   -Continue fluid restriction  Essential Hypertension: The patient's blood pressureover past 24 hrshas ranged 139-154/54-73. At home, the patient is on hydralazine 25mg  qd, losartan 100mg  daily, metoprolol 50mg  daily.  The patient's daughter brought the patient's home medication in today and we disposed off the hydralazine.   -orthostatic vital signs to be done today -continue metoprolol 50mg  qd  -Continue losartan 100mg  daily  Dispo: Anticipated discharge in approximately 0-1 day(s) pending daughter finding alf.   Lars Mage, MD Internal Medicine PGY1 Pager:[phone] 05/08/2017, 1:16 PM

## 2017-05-08 NOTE — NC FL2 (Signed)
[place] LEVEL OF CARE SCREENING TOOL     IDENTIFICATION  Patient Name: Jodi Frank Birthdate: [birth year] Sex: female Admission Date (Current Location): 05/04/2017  [facility] and [place] Number:  Herbalist and Address:  [facility], [place] [address], [place], [place] [address]      Provider Number: [id number]  Attending Physician Name and Address:  Lucious Groves, DO  Relative Name and Phone Number:       Current Level of Care: Hospital Recommended Level of Care: [place] Prior Approval Number:    Date Approved/Denied: 11/04/12 PASRR Number: [id number]   Discharge Plan: SNF    Current Diagnoses: Patient Active Problem List   Diagnosis Date Noted  . Abdominal aortic atherosclerosis ([place]) 05/06/2017  . 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 Mild cognitive impairment 05/06/2017  . H/O mitral valve replacement with mechanical valve 05/06/2017  . Stage 1 skin ulcer of sacral region ([place]) 05/05/2017  . Scalp laceration 05/05/2017  . Acute encephalopathy 05/05/2017  . Fall 05/04/2017  . Memory difficulty 01/18/2016  . Vertigo 01/18/2016  . Hip fracture, left S/P cannulated hip pinning 11/10/2012  . Insomnia 11/10/2012  . Muscle spasm 11/10/2012  . Essential hypertension, benign 11/10/2012  . Constipation 11/10/2012  . Distal radius fracture, left 11/10/2012  . Long term (current) use of anticoagulants 11/10/2012  . Acute blood loss anemia 11/05/2012  . DIARRHEA 01/30/2010  . DEPRESSION 12/08/2006  . ALLERGIC RHINITIS 12/08/2006    Orientation RESPIRATION BLADDER Height & Weight     Self, Place, Situation  Normal Continent Weight: 156 lb 1.4 oz (70.8 kg) Height:  5\' 1"  (154.9 cm)  BEHAVIORAL SYMPTOMS/MOOD NEUROLOGICAL BOWEL NUTRITION STATUS      Continent Diet  AMBULATORY STATUS COMMUNICATION OF NEEDS Skin   Supervision Verbally Surgical wounds(laceration on  forehead)                       Personal Care Assistance Level of Assistance  Bathing, Feeding, Dressing Bathing Assistance: Limited assistance Feeding assistance: Independent Dressing Assistance: Limited assistance     Functional Limitations Info  Sight, Hearing, Speech Sight Info: Adequate Hearing Info: Adequate Speech Info: Adequate    SPECIAL CARE FACTORS FREQUENCY  PT (By licensed PT), OT (By licensed OT)     PT Frequency: 5x week OT Frequency: 5x week            Contractures Contractures Info: Not present    Additional Factors Info  Code Status, Allergies Code Status Info: DNR Allergies Info: CARVEDILOL, ACE INHIBITORS, AMLODIPINE, HYDROCHLOROTHIAZIDE           Current Medications (05/08/2017):  This is the current hospital active medication list Current Facility-Administered Medications  Medication Dose Route Frequency Provider Last Rate Last Dose  . 0.9 %  sodium chloride infusion   Intravenous Once Chundi, Vahini, MD      . acetaminophen (TYLENOL) tablet 650 mg  650 mg Oral Q6H PRN Alphonzo Grieve, MD       Or  . acetaminophen (TYLENOL) suppository 650 mg  650 mg Rectal Q6H PRN Alphonzo Grieve, MD      . atorvastatin (LIPITOR) tablet 10 mg  10 mg Oral QHS Alphonzo Grieve, MD   10 mg at 05/07/17 2034  . bacitracin ointment   Topical BID Chundi, Vahini, MD      . calcium carbonate (OS-CAL - dosed in mg of elemental calcium) tablet 500 mg  of elemental calcium  1 tablet Oral Q breakfast Joni Reining C, DO   500 mg of elemental calcium at 05/08/17 0546  . camphor-menthol (SARNA) lotion   Topical PRN Chundi, Vahini, MD      . cholecalciferol (VITAMIN D) tablet 1,000 Units  1,000 Units Oral Daily Alphonzo Grieve, MD   1,000 Units at 05/08/17 [phone]  . enoxaparin (LOVENOX) injection 70 mg  1 mg/kg Subcutaneous Q12H Romona Curls, RPH   70 mg at 05/08/17 1141  . losartan (COZAAR) tablet 100 mg  100 mg Oral Daily Alphonzo Grieve, MD   100 mg at 05/08/17 0942  .  metoprolol succinate (TOPROL-XL) 24 hr tablet 50 mg  50 mg Oral Daily Alphonzo Grieve, MD   50 mg at 05/08/17 0941  . omega-3 acid ethyl esters (LOVAZA) capsule 2 g  2 g Oral BID Joni Reining C, DO   2 g at 05/08/17 0941  . ondansetron (ZOFRAN-ODT) disintegrating tablet 4 mg  4 mg Oral Q8H PRN Alphonzo Grieve, MD       Or  . ondansetron (ZOFRAN) injection 4 mg  4 mg Intravenous Q8H PRN Alphonzo Grieve, MD   4 mg at 05/04/17 2041  . polyethylene glycol (MIRALAX / GLYCOLAX) packet 17 g  17 g Oral Daily PRN Alphonzo Grieve, MD      . polyvinyl alcohol (LIQUIFILM TEARS) 1.4 % ophthalmic solution 1 drop  1 drop Both Eyes QHS Hoffman, Erik C, DO   1 drop at 05/07/17 2245  . ramelteon (ROZEREM) tablet 8 mg  8 mg Oral QHS Tawny Asal, MD   8 mg at 05/07/17 2035  . warfarin (COUMADIN) tablet 7.5 mg  7.5 mg Oral ONCE-1800 Karren Cobble, [place]      . Warfarin - Pharmacist Dosing Inpatient   Does not apply q1800 Lucious Groves, DO         Discharge Medications: Please see discharge summary for a list of discharge medications.  Relevant Imaging Results:  Relevant Lab Results:   Additional Information SS# [id number]  [place], [place]

## 2017-05-08 NOTE — Social Work (Addendum)
CSW spoke with pt daughter on the phone, [facility] has stated that they are willing to take pt.  CSW called admissions at [facility] to arrange any further information, waiting on call back.  CSW also paged teaching service to determine their timeline for discharge.  4:53pm- CSW spoke with [facility], they have accepted pt and will be ready for her to dc, facility has let family know that if Medicare doesn't cover that it will be private pay. Family aware and accepts.   4:56pm- Teaching service returned page, state they will see pt in the morning and then dc her to [facility].   CSW continuing to follow to provide support with discharge.  Alexander Mt, [place] Work [phone]

## 2017-05-08 NOTE — Progress Notes (Signed)
Internal Medicine Attending:   I saw and examined the patient. I reviewed the resident's note and I agree with the resident's findings and plan as documented in the resident's note with the following corrections/additions. INR goal is 2.5-3.5 given mechanical mitral valve,  Continue Lovenox bridging.  Medically her Hgb is stable. We discussed her medications with patient and her daughter [name] who also brought her medications from home.  In addition to her previously reconciled medications she had 0.1mg  clonidine to take at night. She also had bottles of both 4mg  and 6mg  warfarin however she was very clear about what dose of warfarin she is taking. We recommended d/c of hydralazine (was only taking QHS), clonidine. As her BP is well controlled on Metoprolol succinate 50mg  daily and losarten 100mg  daily. She has not had further puritis wihtout vistaril we have discontinued this She has not had further dizziness, we have recommended d/c of meclizine (I was somewhat concerned she may have been treating orthostatic hypotension) Her daughter has arranged follow up with Geriatrics on Tuesday- I think this is great. She is looking into SNF. Medically she is stable for discharge today.

## 2017-05-08 NOTE — Care Management Important Message (Signed)
Important Message  Patient Details  Name: Jodi Frank MRN: [id number] Date of Birth: 05-Jan-1941   Medicare Important Message Given:  Yes    Devlin Mcveigh Montine Circle 05/08/2017, 8:17 AM

## 2017-05-09 DIAGNOSIS — R200 Anesthesia of skin: Secondary | ICD-10-CM | POA: Diagnosis not present

## 2017-05-09 DIAGNOSIS — L98429 Non-pressure chronic ulcer of back with unspecified severity: Secondary | ICD-10-CM | POA: Diagnosis not present

## 2017-05-09 DIAGNOSIS — Z9889 Other specified postprocedural states: Secondary | ICD-10-CM | POA: Diagnosis not present

## 2017-05-09 DIAGNOSIS — I4891 Unspecified atrial fibrillation: Secondary | ICD-10-CM | POA: Diagnosis not present

## 2017-05-09 DIAGNOSIS — S0101XA Laceration without foreign body of scalp, initial encounter: Secondary | ICD-10-CM | POA: Diagnosis not present

## 2017-05-09 DIAGNOSIS — I10 Essential (primary) hypertension: Secondary | ICD-10-CM | POA: Diagnosis not present

## 2017-05-09 DIAGNOSIS — I700 Atherosclerosis of aorta: Secondary | ICD-10-CM | POA: Diagnosis not present

## 2017-05-09 DIAGNOSIS — G92 Toxic encephalopathy: Principal | ICD-10-CM

## 2017-05-09 DIAGNOSIS — I482 Chronic atrial fibrillation: Secondary | ICD-10-CM | POA: Diagnosis not present

## 2017-05-09 DIAGNOSIS — Z9181 History of falling: Secondary | ICD-10-CM | POA: Diagnosis not present

## 2017-05-09 DIAGNOSIS — G3184 Mild cognitive impairment, so stated: Secondary | ICD-10-CM | POA: Diagnosis not present

## 2017-05-09 DIAGNOSIS — R791 Abnormal coagulation profile: Secondary | ICD-10-CM | POA: Diagnosis not present

## 2017-05-09 DIAGNOSIS — S0181XA Laceration without foreign body of other part of head, initial encounter: Secondary | ICD-10-CM | POA: Diagnosis not present

## 2017-05-09 DIAGNOSIS — W19XXXA Unspecified fall, initial encounter: Secondary | ICD-10-CM | POA: Diagnosis not present

## 2017-05-09 DIAGNOSIS — D649 Anemia, unspecified: Secondary | ICD-10-CM | POA: Diagnosis not present

## 2017-05-09 DIAGNOSIS — Z952 Presence of prosthetic heart valve: Secondary | ICD-10-CM | POA: Diagnosis not present

## 2017-05-09 DIAGNOSIS — R41841 Cognitive communication deficit: Secondary | ICD-10-CM | POA: Diagnosis not present

## 2017-05-09 DIAGNOSIS — T50905A Adverse effect of unspecified drugs, medicaments and biological substances, initial encounter: Secondary | ICD-10-CM

## 2017-05-09 DIAGNOSIS — S0181XD Laceration without foreign body of other part of head, subsequent encounter: Secondary | ICD-10-CM | POA: Diagnosis not present

## 2017-05-09 DIAGNOSIS — R2689 Other abnormalities of gait and mobility: Secondary | ICD-10-CM | POA: Diagnosis not present

## 2017-05-09 DIAGNOSIS — I480 Paroxysmal atrial fibrillation: Secondary | ICD-10-CM | POA: Diagnosis not present

## 2017-05-09 DIAGNOSIS — R5381 Other malaise: Secondary | ICD-10-CM | POA: Diagnosis not present

## 2017-05-09 DIAGNOSIS — M6281 Muscle weakness (generalized): Secondary | ICD-10-CM | POA: Diagnosis not present

## 2017-05-09 DIAGNOSIS — E222 Syndrome of inappropriate secretion of antidiuretic hormone: Secondary | ICD-10-CM | POA: Diagnosis not present

## 2017-05-09 DIAGNOSIS — D62 Acute posthemorrhagic anemia: Secondary | ICD-10-CM | POA: Diagnosis not present

## 2017-05-09 DIAGNOSIS — Z7901 Long term (current) use of anticoagulants: Secondary | ICD-10-CM | POA: Diagnosis not present

## 2017-05-09 LAB — CBC
HCT: 26.9 % — ABNORMAL LOW (ref 36.0–46.0)
HEMOGLOBIN: 8.9 g/dL — AB (ref 12.0–15.0)
MCH: 29.7 pg (ref 26.0–34.0)
MCHC: 33.1 g/dL (ref 30.0–36.0)
MCV: 89.7 fL (ref 78.0–100.0)
Platelets: 330 10*3/uL (ref 150–400)
RBC: 3 MIL/uL — AB (ref 3.87–5.11)
RDW: 15.8 % — ABNORMAL HIGH (ref 11.5–15.5)
WBC: 8.4 10*3/uL (ref 4.0–10.5)

## 2017-05-09 LAB — PROTIME-INR
INR: 1.66
PROTHROMBIN TIME: 19.5 s — AB (ref 11.4–15.2)

## 2017-05-09 LAB — CULTURE, BLOOD (ROUTINE X 2)
CULTURE: NO GROWTH
Culture: NO GROWTH
SPECIAL REQUESTS: ADEQUATE
SPECIAL REQUESTS: ADEQUATE

## 2017-05-09 MED ORDER — ATORVASTATIN CALCIUM 10 MG PO TABS: 10.0000 mg | ORAL_TABLET | Freq: Every day | ORAL | Status: DC

## 2017-05-09 MED ORDER — WARFARIN SODIUM 7.5 MG PO TABS: 7.5000 mg | ORAL_TABLET | Freq: Once | ORAL | Status: DC

## 2017-05-09 MED ORDER — WARFARIN SODIUM 7.5 MG PO TABS
7.5000 mg | ORAL_TABLET | Freq: Once | ORAL | Status: DC
  Filled 2017-05-09: qty 1

## 2017-05-09 MED ORDER — ALENDRONATE SODIUM 70 MG PO TABS: 70.0000 mg | ORAL_TABLET | ORAL | Status: DC

## 2017-05-09 MED ORDER — CAMPHOR-MENTHOL 0.5-0.5 % EX LOTN: TOPICAL_LOTION | CUTANEOUS | 0 refills | Status: DC | PRN

## 2017-05-09 MED ORDER — VITAMIN D3 25 MCG (1000 UT) PO CAPS: 1000.0000 [IU] | ORAL_CAPSULE | Freq: Every day | ORAL | Status: DC

## 2017-05-09 MED ORDER — ENOXAPARIN SODIUM 80 MG/0.8ML ~~LOC~~ SOLN: 1.0000 mg/kg | Freq: Two times a day (BID) | SUBCUTANEOUS | Status: DC

## 2017-05-09 MED ORDER — MULTIVITAMINS PO CAPS
1.0000 | ORAL_CAPSULE | Freq: Every day | ORAL | Status: DC
Start: 2017-05-09 — End: 2017-05-12

## 2017-05-09 MED ORDER — METOPROLOL SUCCINATE ER 50 MG PO TB24: 50.0000 mg | ORAL_TABLET | Freq: Every day | ORAL | Status: DC

## 2017-05-09 MED ORDER — LOSARTAN POTASSIUM 100 MG PO TABS: 100.0000 mg | ORAL_TABLET | Freq: Every day | ORAL | 16 refills | Status: DC

## 2017-05-09 MED ORDER — CALCIUM CARBONATE 600 MG PO TABS: 600.0000 mg | ORAL_TABLET | Freq: Every day | ORAL | Status: DC

## 2017-05-09 MED ORDER — HYPROMELLOSE 0.3 % OP GEL: 1.0000 "application " | Freq: Every day | OPHTHALMIC | Status: DC

## 2017-05-09 MED ORDER — OMEGA-3 FISH OIL 1200 MG PO CAPS: 2400.0000 mg | ORAL_CAPSULE | Freq: Two times a day (BID) | ORAL | Status: DC

## 2017-05-09 MED ORDER — BACITRACIN ZINC 500 UNIT/GM EX OINT: TOPICAL_OINTMENT | Freq: Two times a day (BID) | CUTANEOUS | 0 refills | Status: DC

## 2017-05-09 NOTE — Progress Notes (Signed)
   Subjective:  Patient denies further dizziness; she denies chest pain, shortness of breath, focal weakness, headache. She did have ~15sec episode of numbness/tingling which started the left corner of her mouth and traveled down her left arm and leg; she did not have any corresponding symptoms.    Objective:  Vital signs in last 24 hours: Vitals:   05/08/17 0941 05/08/17 1405 05/08/17 2157 05/09/17 0600  BP: (!) 159/57 132/65 130/78 138/72  Pulse: (!) 102 82 83 78  Resp:  18 17 18   Temp:  98.2 F (36.8 C) 98.1 F (36.7 C) 98.6 F (37 C)  TempSrc:  Oral Oral Oral  SpO2:  99% 100% 100%  Weight:      Height:       Constitutional: NAD, pleasant HEENT: ~4cm sutured laceration on forehead - appears well healing. CV: RRR, no murmurs, rubs or gallops Resp: CTAB, no increased work of breathing Abd: soft, NDNT Neuro: A&Ox3, CN 2-12 intact, strength and sensation intact throughout. Reflexes intact  Assessment/Plan:  Acute blood loss anemia The patient's acute drop in hb is likely due to her excessive blood lost post fall. Hgb 8.9 this morning. No signs or symptoms of further bleeding. --f/u CBC in ~1wk  Anticoagulation for mechanical mitral valve and a fib. Goal INR= 2.5-3.5. The patient's INR today was 1.6.  -lovenox bridge to warfarin to continue at SNF  -continue to maintain steady diet without significant fluctuations  Atrial fib with RVR RVR resolved.  -Continue anticoagulation with lovenox bridge to warfarin -continue metoprolol 50mg  qd  Hyponatremia 2/2 SIADH; stable.   Essential Hypertension: Stopped hydralazine this admission. Orthostatic vital signs negative yesterday. -continue metoprolol 50mg  qd  -Continue losartan 100mg  daily  Transient numbness/tingling: Less that 15 sec in duration; does not correlate to any specific neurological process.  Dispo: Anticipated discharge today to SNF with close f/u with [facility].   Alphonzo Grieve, MD IMTS -  PGY2 Pager [phone] 05/09/2017, 2:07 PM

## 2017-05-09 NOTE — Care Management Note (Signed)
Case Management Note  Patient Details  Name: Jodi Frank MRN: [id number] Date of Birth: 1940-09-05  Subjective/Objective:                 DC to SNF, [facility]   Action/Plan:   Expected Discharge Date:  05/09/17               Expected Discharge Plan:  Skilled Nursing Facility  In-House Referral:  Clinical Social Work  Discharge planning Services  CM Consult  Post Acute Care Choice:    Choice offered to:     DME Arranged:    DME Agency:     HH Arranged:    [place] Agency:     Status of Service:  Completed, signed off  If discussed at [employer], dates discussed:    Additional Comments:  Carles Collet, RN 05/09/2017, 3:02 PM

## 2017-05-09 NOTE — Progress Notes (Signed)
  Date: 05/09/2017  Patient name: Jodi Frank  Medical record number: [id number]  Date of birth: 11/13/1940   I have seen and evaluated this patient and I have discussed the plan of care with the house staff. Please see their note for complete details. I concur with their findings with the following additions/corrections: stable for transfer to [facility].  [name], MD 05/09/2017, 3:14 PM

## 2017-05-09 NOTE — Progress Notes (Signed)
Patient reported a transient tingling of lips L, down to L[place] and numbness of L foot that lasted about 10-15 secs.  Daughter at bedside. Vital signs ok, no other complains and no other symptoms at this time.  Will let MD aware.

## 2017-05-09 NOTE — Discharge Summary (Signed)
Name: Jodi Frank MRN: [id number] DOB: [birth year] 76 y.o. PCP: Leamon Arnt, MD  Date of Admission: 05/04/2017 12:48 PM Date of Discharge: 05/09/2017 Attending Physician: Lucious Groves, DO  Discharge Diagnosis:  Principal Problem:  Fall causing scalp laceration subsequent to a supratherapeutic INR  Active Problems: Acute blood loss anemia Atrial Fibrillation Stage 1 skin ulcer of sacral region [facility]) Acute encephalopathy SIADH (Syndrome of inappropriate ADH production) (HCC)   Mild cognitive impairment   H/O mitral valve replacement with mechanical valve   Discharge Medications: Allergies as of 05/09/2017      Reactions   Carvedilol Itching   Ace Inhibitors Other (See Comments)   Reaction (??)   Amlodipine Swelling   Hydrochlorothiazide Other (See Comments)   Hyponatremia      Medication List    STOP taking these medications   hydrALAZINE 25 MG tablet Commonly known as:  APRESOLINE   hydrOXYzine 25 MG capsule Commonly known as:  VISTARIL   meclizine 25 MG tablet Commonly known as:  ANTIVERT   traZODone 50 MG tablet Commonly known as:  DESYREL     TAKE these medications   alendronate 70 MG tablet Commonly known as:  FOSAMAX Take 1 tablet (70 mg total) by mouth every Sunday. Take with a full glass of water on an empty stomach. Start taking on:  05/10/2017   atorvastatin 10 MG tablet Commonly known as:  LIPITOR Take 1 tablet (10 mg total) by mouth at bedtime.   bacitracin ointment Apply topically 2 (two) times daily.   calcium carbonate 600 MG Tabs tablet Commonly known as:  OS-CAL Take 1 tablet (600 mg total) by mouth daily with breakfast.   camphor-menthol lotion Commonly known as:  SARNA Apply topically as needed for itching.   enoxaparin 80 MG/0.8ML injection Commonly known as:  LOVENOX Inject 0.7 mLs (70 mg total) into the skin every 12 (twelve) hours. Until therapeutic INR (goal 2.5-3.5)   hypromellose 0.3 % Gel ophthalmic  ointment Commonly known as:  SYSTANE OVERNIGHT THERAPY Place 1 application into both eyes at bedtime.   losartan 100 MG tablet Commonly known as:  COZAAR Take 1 tablet (100 mg total) by mouth daily.   metoprolol succinate 50 MG 24 hr tablet Commonly known as:  TOPROL-XL Take 1 tablet (50 mg total) by mouth daily.   multivitamin capsule Take 1 capsule by mouth daily.   Omega-3 Fish Oil 1200 MG Caps Take 2 capsules (2,400 mg total) by mouth 2 (two) times daily.   Vitamin D3 1000 units Caps Take 1 capsule (1,000 Units total) by mouth daily.   warfarin 7.5 MG tablet Commonly known as:  COUMADIN Take 1 tablet (7.5 mg total) by mouth one time only at 6 PM. What changed:    medication strength  how much to take  when to take this       Disposition and follow-up:   [name] was discharged from [facility] in good condition.  At the hospital follow up visit please address:  1.   Mechanical valve need for anticoagulation: On warfarin, currently subtherapeutic in setting of holding due to trauma and acute blood loss anemia. She is being bridged with Lovenox 70mg  q12hrs. Please continue to adjsut warfarin dosing until reaches goal INR of 2.5-3.5.    Scalp laceration: please remove stiches within 3-5 days of discharge  Atrial fibrillation: please continue metoprolol and anticoagulation with lovenox bridge to warfarin  HTN: Metoprolol 50mg  daily and losartan 100mg  daily  Polypharmacy: please refrain for using any centrally acting agents as the patient is prone to falls.  2.  Labs / imaging needed at time of follow-up: pt/inr daily until therapeutic (goal 2.5-3.5), cbc in 1wk  3.  Pending labs/ test needing follow-up: blood cultures 05/04/17 - NGTDx4 days  Follow-up Appointments:  Contact information for follow-up providers    Gildardo Cranker, DO Follow up.   Specialty:  Internal Medicine Contact information: [place]  [id number] [phone]            Contact information for after-discharge care    Destination    [facility] Follow up.   Service:  Skilled Nursing Contact information: [place] [facility] Course by problem list:   Fall with head trauma Forehead laceration  Supratherapeutic INR [name] presented post a presumed fall resulting in a forehead laceration and excessive bleeding. The patient could not recall how she fell nor where she hit her head to cause the forehead laceration. CT head imaging did not show any intracranial abnormalities. She had a supratherapeutic INR of 4.3 on presentation which was thought to be due to excessive alcohol consumption (the patient regularly has 2-3 glasses of wine and it is presumed that she may have had more the night prior to her presentation to the hospital). The patient has a mechanical mitral valve so the INR goal in this patient is 2.5-3.5. The patient also stated that she has been eating a lot of green vegetables as of late and therefore it was thought that the patient's change in diet may have increased her INR. The patient's warfarin was held for 2 days. She was subsequently restarted with a lovenox bridge to warfarin.   Acute encephalopathy secondary to polypharmacy The patient presented post a fall and reports that she has been dizzy recently. The patient's medications were reviewed and hydralazine, hydroxyzine, and diphenhydramine was removed from the medication regimen (also disposed of old clonidine prescription).   Acute blood loss anemia: During the hospitalization the patient's hemoglobin dropped from 11 to 6.9. No source of blood loss was found (initial and f/u CT head without subdural hematoma or intracranial hemorrhage; CT abd/pel negative for retroperitoneal hemorrhage) and it was deemed that the patient's hemoglobin drop was following her trauma that  caused excessive bleeding with a supratherapeutic INR. She received 1 unit of pRBCs during hospitalization, following which her Hgb remained stable; at discharge Hgb was 8.9. She should have a repeat CBC in 1 week.  Mechanical mitral valve: Patient s/p mechanical mitral valve replacement in 1990s on warfarin, and was initially supratherapeutic. Warfarin was held for 2 days due to head trauma and acute blood loss anemia in setting of supratherapeutic INR. She was restarted on warfarin with lovenox bridging due to subtherapeutic INR; she should continue lovenox 70mg  BID in addition to warfarin nightly with careful titration until a goal INR of 2.5-3.5 is reached.   Atrial Fibrillation with RVR: The patient was in atrial fibrillation with RVR on admission which resolved with restarting her home metoprolol. The patient was continued on metoprolol 50mg  qd and although the patient's warfarin was held due to INR being supratherapeutic, it was quickly started on lovenox to warfarin bridge.  Discharge Vitals:   BP 138/72 (BP Location: Left Arm)   Pulse 78   Temp 98.6 F (37 C) (Oral)  Resp 18   Ht 5\' 1"  (1.549 m)   Wt 156 lb 1.4 oz (70.8 kg)   SpO2 100%   BMI 29.49 kg/m   Pertinent Labs, Studies, and Procedures:  CBC    Component Value Date/Time   WBC 8.4 05/09/2017 0949   RBC 3.00 (L) 05/09/2017 0949   HGB 8.9 (L) 05/09/2017 0949   HCT 26.9 (L) 05/09/2017 0949   PLT 330 05/09/2017 0949   MCV 89.7 05/09/2017 0949   MCH 29.7 05/09/2017 0949   MCHC 33.1 05/09/2017 0949   RDW 15.8 (H) 05/09/2017 0949   LYMPHSABS 2.2 05/06/2017 0852   MONOABS 1.0 05/06/2017 0852   EOSABS 0.1 05/06/2017 0852   BASOSABS 0.0 05/06/2017 0852   BMP Latest Ref Rng & Units 05/08/2017 05/07/2017 05/06/2017  Glucose 65 - 99 mg/dL 100(H) 111(H) 106(H)  BUN 6 - 20 mg/dL 7 9 9   Creatinine 0.44 - 1.00 mg/dL 0.76 0.65 0.65  Sodium 135 - 145 mmol/L 134(L) 131(L) 131(L)  Potassium 3.5 - 5.1 mmol/L 3.4(L) 3.5 3.9   Chloride 101 - 111 mmol/L 103 102 102  CO2 22 - 32 mmol/L 24 21(L) 22  Calcium 8.9 - 10.3 mg/dL 8.6(L) 8.3(L) 8.1(L)   Blood cultures 05/04/17: NGTD x 4 days. CT cervical spine wo contrast (05/04/17):  1. Chronic ischemic microangiopathy without acute intracranial abnormality. 2. Left frontal scalp laceration and small associated subgaleal hematoma. No skull fracture. 3. No acute fracture or static subluxation of the cervical spine. 4. Mild C4-5 spinal canal narrowing due to disc osteophyte complex. Neural foraminal narrowing at right C3-4, right C4-5 and left C5-6 secondary to uncovertebral hypertrophy.  CT Head wo contrast (05/04/17): 1. Chronic ischemic microangiopathy without acute intracranial abnormality. 2. Left frontal scalp laceration and small associated subgaleal hematoma. No skull fracture. 3. No acute fracture or static subluxation of the cervical spine. 4. Mild C4-5 spinal canal narrowing due to disc osteophyte complex. Neural foraminal narrowing at right C3-4, right C4-5 and left C5-6 secondary to uncovertebral hypertrophy. CT Abdomen Pelvis WO Contrast (05/06/17): No hematoma, no acute findings Aortic atherosclerosis  CT Head wo contrast (05/05/17): No acute intracranial abnormality. Stable left frontal scalp laceration and contusion. Stable chronic microvascular ischemic changes and parenchymal volume loss of the brain. Echo 05/05/17: - Left ventricle: The cavity size was normal. Wall thickness was   normal. Systolic function was vigorous. The estimated ejection   fraction was in the range of 65% to 70%. Wall motion was normal;   there were no regional wall motion abnormalities. - Aortic valve: There was mild regurgitation. - Mitral valve: A mechanical prosthesis was present. Valve area by   continuity equation (using LVOT flow): 1.57 cm^2. - Left atrium: The atrium was severely dilated. - Right atrium: The atrium was moderately dilated. - Pulmonary arteries:  Systolic pressure was moderately increased.   PA peak pressure: 56 mm Hg (S)  Discharge Instructions: Discharge Instructions    Call MD for:  difficulty breathing, headache or visual disturbances   Complete by:  As directed    Call MD for:  extreme fatigue   Complete by:  As directed    Call MD for:  hives   Complete by:  As directed    Call MD for:  persistant dizziness or light-headedness   Complete by:  As directed    Call MD for:  persistant nausea and vomiting   Complete by:  As directed    Call MD for:  redness, tenderness, or signs of  infection (pain, swelling, redness, odor or green/yellow discharge around incision site)   Complete by:  As directed    Call MD for:  severe uncontrolled pain   Complete by:  As directed    Call MD for:  temperature >100.4   Complete by:  As directed    Diet - low sodium heart healthy   Complete by:  As directed    Discharge instructions   Complete by:  As directed    For blood pressure, take: Metoprolol 50mg  daily Losartan 100mg  daily  For blood thinner: Currently subtherapeutic INR to 1.6 due to holding; Goal INR is 2.5-3.5. She is getting bridged with lovenox 70mg  q12hr and should receive warfarin 7.5mg  night of 12/15; further warfarin dosing needs to be adjusted based on daily INR.  Fall: Likely secondary to polypharmacy; avoid antihistamines and sedatives. Would encourage fall precautions while at SNF.  Patient will need daily INR to titrate warfarin dosing to 2.5-3.5 goal. She will need CBC in one week.   Increase activity slowly   Complete by:  As directed       Signed: Alphonzo Grieve, MD IMTS - PGY2 05/09/2017, 2:28 PM

## 2017-05-09 NOTE — Progress Notes (Signed)
Hand off report given to Solmon Ice RN at [facility]

## 2017-05-09 NOTE — Progress Notes (Signed)
ANTICOAGULATION CONSULT NOTE - Follow Up Consult  Pharmacy Consult for Lovenox/Coumadin Indication: MVR and afib  Allergies  Allergen Reactions  . Carvedilol Itching  . Ace Inhibitors Other (See Comments)    Reaction (??)  . Amlodipine Swelling  . Hydrochlorothiazide Other (See Comments)    Hyponatremia     Patient Measurements: Height: 5\' 1"  (154.9 cm) Weight: 156 lb 1.4 oz (70.8 kg) IBW/kg (Calculated) : 47.8  Vital Signs: Temp: 98.6 F (37 C) (12/15 0600) Temp Source: Oral (12/15 0600) BP: 138/72 (12/15 0600) Pulse Rate: 78 (12/15 0600)  Labs: Recent Labs    05/07/17 0645 05/08/17 0540 05/09/17 0949  HGB 8.3* 8.3* 8.9*  HCT 24.2* 24.7* 26.9*  PLT 259 270 330  LABPROT 19.5* 19.0* 19.5*  INR 1.67 1.61 1.66  CREATININE 0.65 0.76  --     Estimated Creatinine Clearance: 55.5 mL/min (by C-G formula based on SCr of 0.76 mg/dL).  Assessment:  Anticoag: Coumadin PTA for hx AFib + mech MVR >> held for INR 4.26 in setting of forehead laceration/subgaleal hematoma. Enox bridge started 12/13. S/p 1u PRBC 12/12.  Hgb 8.9 improved stable. Platelets 330 up. INR 1.61>1.66 no change overnight. *home dose: 5mg  daily, last dose 12/10  In conversing with pt and daughter about Coumadin, pt has been taking it > 20 years, is very knowledgeable about the drugs, generics, regimen, monitoring, diet, etc.   Goal of Therapy:  INR 2.5-3.5 Monitor platelets by anticoagulation protocol: Yes   Plan:  Lovenox 70mg  SQ q 12 hrs. Coumadin 7.5mg  po x 1 tonight Daily INR. CBC q 72h   Jodi Frank, PharmD, BCPS Clinical Staff Pharmacist Pager [phone]  Jodi Frank 05/09/2017,11:21 AM

## 2017-05-09 NOTE — Progress Notes (Addendum)
Clinical Social Worker facilitated patient discharge including contacting patient family and facility to confirm patient discharge plans.  Clinical information faxed to facility and family agreeable with plan. Patients daughter will transport patient to [facility] via personal vehicle .  RN to call [phone] for report prior to discharge.  Clinical Social Worker will sign off for now as social work intervention is no longer needed. Please consult [place] again if new need arises.  Rhea Pink, MSW, [place]

## 2017-05-09 NOTE — Discharge Instructions (Signed)

## 2017-05-10 LAB — TYPE AND SCREEN
ABO/RH(D): O POS
ANTIBODY SCREEN: NEGATIVE
UNIT DIVISION: 0
Unit division: 0
Unit division: 0

## 2017-05-10 LAB — BPAM RBC
BLOOD PRODUCT EXPIRATION DATE: 201901012359
BLOOD PRODUCT EXPIRATION DATE: 201901012359
Blood Product Expiration Date: 201901012359
ISSUE DATE / TIME: 201812121600
UNIT TYPE AND RH: 5100
UNIT TYPE AND RH: 5100
Unit Type and Rh: 5100

## 2017-05-11 DIAGNOSIS — Z952 Presence of prosthetic heart valve: Secondary | ICD-10-CM | POA: Diagnosis not present

## 2017-05-11 DIAGNOSIS — I4891 Unspecified atrial fibrillation: Secondary | ICD-10-CM | POA: Diagnosis not present

## 2017-05-11 DIAGNOSIS — Z9181 History of falling: Secondary | ICD-10-CM | POA: Diagnosis not present

## 2017-05-11 DIAGNOSIS — S0181XA Laceration without foreign body of other part of head, initial encounter: Secondary | ICD-10-CM | POA: Diagnosis not present

## 2017-05-12 ENCOUNTER — Encounter: Payer: Self-pay | Admitting: Internal Medicine

## 2017-05-12 ENCOUNTER — Ambulatory Visit (INDEPENDENT_AMBULATORY_CARE_PROVIDER_SITE_OTHER): Payer: Medicare Other | Admitting: Internal Medicine

## 2017-05-12 VITALS — BP 164/82 | HR 91 | Temp 98.0°F | Ht 63.0 in | Wt 157.2 lb

## 2017-05-12 DIAGNOSIS — I480 Paroxysmal atrial fibrillation: Secondary | ICD-10-CM | POA: Diagnosis not present

## 2017-05-12 DIAGNOSIS — D649 Anemia, unspecified: Secondary | ICD-10-CM

## 2017-05-12 DIAGNOSIS — G3184 Mild cognitive impairment, so stated: Secondary | ICD-10-CM

## 2017-05-12 DIAGNOSIS — R5381 Other malaise: Secondary | ICD-10-CM

## 2017-05-12 DIAGNOSIS — Z7901 Long term (current) use of anticoagulants: Secondary | ICD-10-CM

## 2017-05-12 DIAGNOSIS — S0101XA Laceration without foreign body of scalp, initial encounter: Secondary | ICD-10-CM | POA: Diagnosis not present

## 2017-05-12 DIAGNOSIS — I10 Essential (primary) hypertension: Secondary | ICD-10-CM

## 2017-05-12 DIAGNOSIS — L98429 Non-pressure chronic ulcer of back with unspecified severity: Secondary | ICD-10-CM

## 2017-05-12 DIAGNOSIS — Z952 Presence of prosthetic heart valve: Secondary | ICD-10-CM | POA: Diagnosis not present

## 2017-05-12 DIAGNOSIS — W19XXXA Unspecified fall, initial encounter: Secondary | ICD-10-CM

## 2017-05-12 DIAGNOSIS — I700 Atherosclerosis of aorta: Secondary | ICD-10-CM

## 2017-05-12 LAB — CBC
HEMATOCRIT: 29.1 % — AB (ref 35.0–45.0)
Hemoglobin: 9.6 g/dL — ABNORMAL LOW (ref 11.7–15.5)
MCH: 29.5 pg (ref 27.0–33.0)
MCHC: 33 g/dL (ref 32.0–36.0)
MCV: 89.5 fL (ref 80.0–100.0)
MPV: 9.8 fL (ref 7.5–12.5)
Platelets: 377 10*3/uL (ref 140–400)
RBC: 3.25 10*6/uL — AB (ref 3.80–5.10)
RDW: 13.5 % (ref 11.0–15.0)
WBC: 7.6 10*3/uL (ref 3.8–10.8)

## 2017-05-12 NOTE — Patient Instructions (Addendum)
Follow up with [facility] for home health.   Will call with cardiology referral  Follow up after you complete short term rehab at [facility] scalp laceration with saline and apply small amount of topical antibiotic then cover with telfa and 4x4. May remove sutures on December 20th.  Will call with lab results  Will need to make an appointment with coumadin Nurse, Jodi Frank, once discharged from [facility].

## 2017-05-12 NOTE — Progress Notes (Signed)
Patient ID: Jodi Frank, female   DOB: July 14, 1940, 76 y.o.   MRN: [id number]   Location:  [facility] OFFICE  Provider: DR Arletha Grippe  Code Status:  Goals of Care:  Advanced Directives 05/04/2017  Does Patient Have a Medical Advance Directive? Yes  Type of Paramedic of [place];Living will  Does patient want to make changes to medical advance directive? No - Patient declined  Copy of [place] in Chart? No - copy requested  Would patient like information on creating a medical advance directive? No - Patient declined  Pre-existing out of facility DNR order (yellow form or pink MOST form) -     Chief Complaint  Patient presents with  . Establish Care    New Patient to Establish care    HPI: Patient is a 76 y.o. female seen today as a new pt. She was recently hospitalized for fall --> scalp laceration, acute blood loss anemia, afib w RVR, stage 1 sacral ulcer, acute encephalopathy,  SIADH. Laceration sutured and was told to have sutures removed in 3-5 days. INR 4.3 on admission. Hgb dropped to 6.9 and she rec'd 1 unit PRBCs. Hgb 8.9 at d/c. Several meds adjusted/stopped. She was started on lovenox bridge for INR 1.6. Coumadin resumed. INR 3.6 today. She is currently at [facility] for short term rehab. Sacral sore has healed. She is followed by wound care at [facility].   Her previous PCP left the practice at [facility]. INR monitored by PCP.   Daughter c/a falls - she falls at least 1 time per yr. She drinks  Several glasses of wine at night which contributes to falls.  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 Takes coumadin. INR GOAL 2.5-3.5 due to mechanical valve. Rate controlled with BB. 2D echo revealed EF 65-70% in Dec 2018.   Hx  anemia - stable  Osteoporosis - stable on fosamax. No recent DXA  Hyperlipidemia - stakes lipitor and omega 3 fish oil. No recent lipid panel  Hx abdominal atherosclerosis - on lovenox.   Past Medical History:  Diagnosis Date  . Broken wrist   . Depression   . Diarrhea   . High cholesterol   . Hypertension   . Memory difficulty 01/18/2016  . 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 Vertigo 01/18/2016    Past Surgical History:  Procedure Laterality Date  . HEMATOMA EVACUATION Left 11/25/2012   Procedure: EVACUATION HEMATOMA;  Surgeon: Linna Hoff, MD;  Location: [place];  Service: Orthopedics;  Laterality: Left;  . HIP PINNING,CANNULATED Left 11/03/2012   Procedure: CANNULATED HIP PINNING;  Surgeon: Tobi Bastos, MD;  Location: [facility];  Service: Orthopedics;  Laterality: Left;  . 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  . OPEN REDUCTION INTERNAL FIXATION (ORIF) WRIST WITH ILIAC CREST BONE GRAFT Left 11/24/2012   Procedure: OPEN REDUCTION INTERNAL FIXATION LEFT WRIST POSSIBLE BONE GRAFTING AND PINNING (ORIF) ;  Surgeon: Linna Hoff, MD;  Location: [place];  Service: Orthopedics;  Laterality: Left;  . SHOULDER OPEN ROTATOR CUFF REPAIR       reports that  has never smoked. she has never used smokeless tobacco. She reports that she drinks about 2.4 oz of alcohol per week. She  reports that she does not use drugs. Social History   Socioeconomic History  . Marital status: Divorced    Spouse name: Not on file  . Number of children: 1  . Years of education: BS  . Highest education level: Not on file  Social Needs  . Financial resource strain: Not on file  . Food insecurity - worry: Not on file  . Food insecurity - inability: Not on file  . Transportation needs - medical: Not on file  . Transportation needs - non-medical: Not on file  Occupational History  . Occupation: Retired  Tobacco Use  . Smoking status: Never Smoker  . Smokeless tobacco: Never Used    Substance and Sexual Activity  . Alcohol use: Yes    Alcohol/week: 2.4 oz    Types: 4 Glasses of wine per week  . Drug use: No  . Sexual activity: Not Currently  Other Topics Concern  . Not on file  Social History Narrative   Lives at home, alone   Right-handed   Caffeine: 2 cups per day    Family History  Problem Relation Age of Onset  . Stroke Mother   . Hypertension Mother   . Stroke Father   . Colon cancer Paternal Grandmother   . Parkinsonism Brother     Allergies  Allergen Reactions  . Carvedilol Itching  . Ace Inhibitors Other (See Comments)    Reaction (??)  . Amlodipine Swelling  . Hydrochlorothiazide Other (See Comments)    Hyponatremia     Outpatient Encounter Medications as of 05/12/2017  Medication Sig  . alendronate (FOSAMAX) 70 MG tablet Take 1 tablet (70 mg total) by mouth every Sunday. Take with a full glass of water on an empty stomach.  Marland Kitchen atorvastatin (LIPITOR) 10 MG tablet Take 1 tablet (10 mg total) by mouth at bedtime.  . camphor-menthol (SARNA) lotion Apply topically as needed for itching.  . Cholecalciferol (VITAMIN D3) 1000 units CAPS Take 1 capsule (1,000 Units total) by mouth daily.  Marland Kitchen enoxaparin (LOVENOX) 80 MG/0.8ML injection Inject 0.7 mLs (70 mg total) into the skin every 12 (twelve) hours. Until therapeutic INR (goal 2.5-3.5)  . hypromellose (SYSTANE OVERNIGHT THERAPY) 0.3 % GEL ophthalmic ointment Place 1 application into both eyes at bedtime.  Marland Kitchen losartan (COZAAR) 100 MG tablet Take 1 tablet (100 mg total) by mouth daily.  . metoprolol succinate (TOPROL-XL) 50 MG 24 hr tablet Take 1 tablet (50 mg total) by mouth daily.  . Omega-3 Fatty Acids (OMEGA-3 FISH OIL) 1200 MG CAPS Take 2 capsules (2,400 mg total) by mouth 2 (two) times daily.  . traZODone (DESYREL) 50 MG tablet Take 50 mg by mouth as needed for sleep (1/2 to 1 tablet PRN).  . [DISCONTINUED] bacitracin ointment Apply topically 2 (two) times daily. (Patient not taking: Reported  on 05/12/2017)  . [DISCONTINUED] calcium carbonate (OS-CAL) 600 MG TABS tablet Take 1 tablet (600 mg total) by mouth daily with breakfast. (Patient not taking: Reported on 05/12/2017)  . [DISCONTINUED] Multiple Vitamin (MULTIVITAMIN) capsule Take 1 capsule by mouth daily. (Patient not taking: Reported on 05/12/2017)  . [DISCONTINUED] warfarin (COUMADIN) 7.5 MG tablet Take 1 tablet (7.5 mg total) by mouth one time only at 6 PM.   No facility-administered encounter medications on file as of 05/12/2017.     Review of Systems:  Review of Systems  Eyes:       Dry eye  Skin:       itchy  Neurological: Positive for dizziness.  Hematological: Bruises/bleeds easily.  All other systems reviewed and are negative.   Health Maintenance  Topic Date Due  . TETANUS/TDAP  06/02/1961  . MAMMOGRAM  06/02/1992  . DEXA SCAN  06/03/2007  . PNA vac Low Risk Adult (1 of 2 - PCV13) 06/03/2007  . COLONOSCOPY  09/20/2015  . INFLUENZA VACCINE  12/24/2016    Physical Exam: Vitals:   05/12/17 1135  BP: (!) 164/82  Pulse: 91  Temp: 98 F (36.7 C)  TempSrc: Oral  SpO2: 97%  Weight: 157 lb 3.2 oz (71.3 kg)  Height: 5\' 3"  (1.6 m)   Body mass index is 27.85 kg/m. Physical Exam  Constitutional: She appears well-developed and well-nourished.  HENT:  Head: Head is with laceration (bloody bandage).    Mouth/Throat: Oropharynx is clear and moist. No oropharyngeal exudate.  MMM; no oral thrush  Eyes: Pupils are equal, round, and reactive to light. No scleral icterus.  Neck: Neck supple. Muscular tenderness present. Carotid bruit is not present. Decreased range of motion present. No tracheal deviation present. No thyromegaly present.  Cardiovascular: Normal rate, regular rhythm and intact distal pulses. Exam reveals no gallop and no friction rub.  Murmur (1/6 SEM) heard. No LE edema b/l. no calf TTP.   Pulmonary/Chest: Effort normal and breath sounds normal. No stridor. No respiratory distress. She has  no wheezes. She has no rales.  Abdominal: Soft. Normal appearance and bowel sounds are normal. She exhibits no distension and no mass. There is no hepatomegaly. There is no tenderness. There is no rigidity, no rebound and no guarding. No hernia.  Musculoskeletal: She exhibits edema.  Lymphadenopathy:    She has no cervical adenopathy.  Neurological: She is alert.  Skin: Skin is warm and dry. No rash noted.  Psychiatric: She has a normal mood and affect. Her behavior is normal. Judgment and thought content normal.    Labs reviewed: Basic Metabolic Panel: Recent Labs    05/06/17 0619 05/07/17 0645 05/08/17 0540  NA 131* 131* 134*  K 3.9 3.5 3.4*  CL 102 102 103  CO2 22 21* 24  GLUCOSE 106* 111* 100*  BUN 9 9 7   CREATININE 0.65 0.65 0.76  CALCIUM 8.1* 8.3* 8.6*   Liver Function Tests: Recent Labs    05/04/17 1319  AST 39  ALT 24  ALKPHOS 52  BILITOT 1.5*  PROT 6.3*  ALBUMIN 3.8   No results for input(s): LIPASE, AMYLASE in the last 8760 hours. No results for input(s): AMMONIA in the last 8760 hours. CBC: Recent Labs    05/04/17 1319 05/06/17 0852  05/07/17 0645 05/08/17 0540 05/09/17 0949  WBC 18.8* 11.7*  --  13.7* 9.0 8.4  NEUTROABS 16.7* 8.4*  --   --   --   --   HGB 10.8* 6.9*   < > 8.3* 8.3* 8.9*  HCT 31.7* 19.9*   < > 24.2* 24.7* 26.9*  MCV 84.8 85.8  --  85.5 87.3 89.7  PLT 341 240  --  259 270 330   < > = values in this interval not displayed.   Lipid Panel: No results for input(s): CHOL, HDL, LDLCALC, TRIG, CHOLHDL, LDLDIRECT in the last 8760 hours. No results found for: HGBA1C  Procedures since last visit: Ct Abdomen Pelvis Wo Contrast  Result Date: 05/06/2017 CLINICAL DATA:  Acute post hemorrhagic anemia. EXAM: CT ABDOMEN AND PELVIS WITHOUT CONTRAST TECHNIQUE: Multidetector CT imaging of the abdomen and pelvis was performed following the standard protocol without IV contrast. COMPARISON:  None. FINDINGS: Lower chest: Borderline septal thickening  symmetrically at the bases. Hepatobiliary: No focal liver abnormality.No evidence of biliary obstruction or stone. Pancreas: Unremarkable. Spleen: Unremarkable. Adrenals/Urinary Tract: Negative adrenals. No hydronephrosis or stone. Small low-density in the interpolar left kidney is too small for densitometry. Unremarkable bladder. Stomach/Bowel:  No obstruction. No appendicitis. Vascular/Lymphatic: No acute vascular abnormality. Atherosclerotic calcification of the aorta. Left IVC. No mass or adenopathy. Reproductive:3.2 cm mass with internal calcification projecting posteriorly from the lower uterine segment, most consistent with fibroid. Other: No ascites or pneumoperitoneum. Musculoskeletal: Remote left femoral neck fracture repair. Disc degeneration in the lumbar spine with mild scoliosis. IMPRESSION: 1. No acute finding or hematoma to explain anemia. 2. Question early interstitial pulmonary edema, please correlate with volume status. 3.  Aortic Atherosclerosis (ICD10-I70.0). Electronically Signed   By: Monte Fantasia M.D.   On: 05/06/2017 11:23   Dg Chest 1 View  Result Date: 05/04/2017 CLINICAL DATA:  Woke up this morning with blood over her bed and on the head board, unknown injury, question fall EXAM: CHEST 1 VIEW COMPARISON:  11/24/2012 FINDINGS: Normal heart size post median sternotomy. Mediastinal contours and pulmonary vascularity normal. Mild chronic bronchitic changes. No acute infiltrate, pleural effusion or pneumothorax. Diffuse osseous demineralization. IMPRESSION: Mild chronic bronchitic changes without acute abnormalities. Electronically Signed   By: Lavonia Dana M.D.   On: 05/04/2017 14:11   Ct Head Wo Contrast  Result Date: 05/05/2017 CLINICAL DATA:  76 y/o  F; head trauma. EXAM: CT HEAD WITHOUT CONTRAST TECHNIQUE: Contiguous axial images were obtained from the base of the skull through the vertex without intravenous contrast. COMPARISON:  05/04/2017 CT head FINDINGS: Brain: No  evidence of acute infarction, hemorrhage, hydrocephalus, extra-axial collection or mass lesion/mass effect. Stable chronic microvascular ischemic changes and parenchymal volume loss of the brain. Vascular: Calcific atherosclerosis of carotid siphons. No hyperdense vessel Skull: Left frontal scalp laceration and contusion is stable. No calvarial fracture. Sinuses/Orbits: No acute finding. Other: None. IMPRESSION: 1. Stable left frontal scalp laceration and contusion. No calvarial fracture. 2. No acute intracranial abnormality. 3. Stable chronic microvascular ischemic changes and parenchymal volume loss of the brain. Electronically Signed   By: Kristine Garbe M.D.   On: 05/05/2017 02:35   Ct Head Wo Contrast  Result Date: 05/04/2017 CLINICAL DATA:  Ataxia and head trauma. Altered mental status with extensive bleeding. Unknown injury. EXAM: CT HEAD WITHOUT CONTRAST CT CERVICAL SPINE WITHOUT CONTRAST TECHNIQUE: Multidetector CT imaging of the head and cervical spine was performed following the standard protocol without intravenous contrast. Multiplanar CT image reconstructions of the cervical spine were also generated. COMPARISON:  Head CT 01/30/2016 FINDINGS: CT HEAD FINDINGS Brain: No mass lesion, intraparenchymal hemorrhage or extra-axial collection. No evidence of acute cortical infarct. There is periventricular hypoattenuation compatible with chronic microvascular disease. Vascular: No hyperdense vessel or unexpected calcification. Skull: No skull fracture. Normal skullbase. Left frontal scalp laceration and small subgaleal hematoma. Sinuses/Orbits: No sinus fluid levels or advanced mucosal thickening. No mastoid effusion. Normal orbits. CT CERVICAL SPINE FINDINGS Alignment: No static subluxation. Facets are aligned. Occipital condyles are normally positioned. Skull base and vertebrae: No acute fracture. Soft tissues and spinal canal: No prevertebral fluid or swelling. No visible canal hematoma. Disc  levels: C4-5 disc osteophyte complex causes mild spinal canal narrowing. Uncovertebral hypertrophy on the right at C3-4 and C4-5 and on the left at C5-6 narrows the neural exit foramina. Upper chest: No pneumothorax, pulmonary nodule or pleural effusion. Other: Normal visualized paraspinal cervical soft tissues.  IMPRESSION: 1. Chronic ischemic microangiopathy without acute intracranial abnormality. 2. Left frontal scalp laceration and small associated subgaleal hematoma. No skull fracture. 3. No acute fracture or static subluxation of the cervical spine. 4. Mild C4-5 spinal canal narrowing due to disc osteophyte complex. Neural foraminal narrowing at right C3-4, right C4-5 and left C5-6 secondary to uncovertebral hypertrophy. Electronically Signed   By: Ulyses Jarred M.D.   On: 05/04/2017 14:15   Ct Cervical Spine Wo Contrast  Result Date: 05/04/2017 CLINICAL DATA:  Ataxia and head trauma. Altered mental status with extensive bleeding. Unknown injury. EXAM: CT HEAD WITHOUT CONTRAST CT CERVICAL SPINE WITHOUT CONTRAST TECHNIQUE: Multidetector CT imaging of the head and cervical spine was performed following the standard protocol without intravenous contrast. Multiplanar CT image reconstructions of the cervical spine were also generated. COMPARISON:  Head CT 01/30/2016 FINDINGS: CT HEAD FINDINGS Brain: No mass lesion, intraparenchymal hemorrhage or extra-axial collection. No evidence of acute cortical infarct. There is periventricular hypoattenuation compatible with chronic microvascular disease. Vascular: No hyperdense vessel or unexpected calcification. Skull: No skull fracture. Normal skullbase. Left frontal scalp laceration and small subgaleal hematoma. Sinuses/Orbits: No sinus fluid levels or advanced mucosal thickening. No mastoid effusion. Normal orbits. CT CERVICAL SPINE FINDINGS Alignment: No static subluxation. Facets are aligned. Occipital condyles are normally positioned. Skull base and vertebrae: No  acute fracture. Soft tissues and spinal canal: No prevertebral fluid or swelling. No visible canal hematoma. Disc levels: C4-5 disc osteophyte complex causes mild spinal canal narrowing. Uncovertebral hypertrophy on the right at C3-4 and C4-5 and on the left at C5-6 narrows the neural exit foramina. Upper chest: No pneumothorax, pulmonary nodule or pleural effusion. Other: Normal visualized paraspinal cervical soft tissues. IMPRESSION: 1. Chronic ischemic microangiopathy without acute intracranial abnormality. 2. Left frontal scalp laceration and small associated subgaleal hematoma. No skull fracture. 3. No acute fracture or static subluxation of the cervical spine. 4. Mild C4-5 spinal canal narrowing due to disc osteophyte complex. Neural foraminal narrowing at right C3-4, right C4-5 and left C5-6 secondary to uncovertebral hypertrophy. Electronically Signed   By: Ulyses Jarred M.D.   On: 05/04/2017 14:15    Assessment/Plan   ICD-10-CM   1. Physical deconditioning R53.81   2. Stage 1 skin ulcer of sacral region ([place]) L98.429    resolved  3. Fall, initial encounter W19.XXXA   4. Laceration of scalp, initial encounter S01.01XA   5. Essential hypertension, benign I10 Ambulatory referral to Cardiology  6. 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 H/O mitral valve replacement with mechanical valve Z95.2 Ambulatory referral to Cardiology  8. Long term (current) use of anticoagulants Z79.01   9. Abdominal aortic atherosclerosis (HCC) I70.0   10. Anemia, unspecified type D64.9 CBC (no diff)  11. Paroxysmal atrial fibrillation (HCC) I48.0 Ambulatory referral to Cardiology    [facility] up with [facility] for [facility] health.   Will call with cardiology referral  Follow up after you complete short term rehab at [place] [facility] scalp laceration with saline and apply small amount of topical antibiotic then cover with telfa and 4x4. May remove sutures on December 20th.  Will call with lab results  Will need to  make an appointment with coumadin Nurse, Tivis Ringer, once discharged from [facility].   Hara Milholland S. Perlie Gold  [facility] [address] [place],  [address] [phone] Cell (Monday-Friday 8 AM - 5 PM) [phone] After 5 PM and follow prompts

## 2017-05-13 DIAGNOSIS — Z952 Presence of prosthetic heart valve: Secondary | ICD-10-CM | POA: Diagnosis not present

## 2017-05-13 DIAGNOSIS — S0101XA Laceration without foreign body of scalp, initial encounter: Secondary | ICD-10-CM | POA: Diagnosis not present

## 2017-05-13 DIAGNOSIS — Z7901 Long term (current) use of anticoagulants: Secondary | ICD-10-CM | POA: Diagnosis not present

## 2017-05-13 DIAGNOSIS — Z9181 History of falling: Secondary | ICD-10-CM | POA: Diagnosis not present

## 2017-05-15 DIAGNOSIS — Z952 Presence of prosthetic heart valve: Secondary | ICD-10-CM | POA: Diagnosis not present

## 2017-05-15 DIAGNOSIS — Z9181 History of falling: Secondary | ICD-10-CM | POA: Diagnosis not present

## 2017-05-15 DIAGNOSIS — D649 Anemia, unspecified: Secondary | ICD-10-CM | POA: Insufficient documentation

## 2017-05-15 DIAGNOSIS — I480 Paroxysmal atrial fibrillation: Secondary | ICD-10-CM | POA: Insufficient documentation

## 2017-05-21 ENCOUNTER — Ambulatory Visit (INDEPENDENT_AMBULATORY_CARE_PROVIDER_SITE_OTHER): Payer: Medicare Other | Admitting: Nurse Practitioner

## 2017-05-21 ENCOUNTER — Encounter: Payer: Self-pay | Admitting: Nurse Practitioner

## 2017-05-21 ENCOUNTER — Ambulatory Visit: Payer: Medicare Other

## 2017-05-21 VITALS — BP 136/84 | HR 71 | Temp 97.9°F | Resp 17 | Ht 63.0 in | Wt 158.0 lb

## 2017-05-21 DIAGNOSIS — I480 Paroxysmal atrial fibrillation: Secondary | ICD-10-CM

## 2017-05-21 DIAGNOSIS — Z7901 Long term (current) use of anticoagulants: Secondary | ICD-10-CM | POA: Diagnosis not present

## 2017-05-21 LAB — POCT INR: INR: 4.1

## 2017-05-21 MED ORDER — WARFARIN SODIUM 6 MG PO TABS: ORAL_TABLET | ORAL | Status: DC

## 2017-05-21 NOTE — Progress Notes (Signed)
Careteam: Patient Care Team: Jodi Cranker, DO as PCP - General (Internal Medicine)   Allergies  Allergen Reactions  . Carvedilol Itching  . Ace Inhibitors Other (See Comments)    Reaction (??)  . Amlodipine Swelling  . Hydrochlorothiazide Other (See Comments)    Hyponatremia     Chief Complaint  Patient presents with  . Follow-up    Pt is being seen for an INR check. Current INR is 4.1- pt currently taking 6mg  of coumadin daily.      HPI: Patient is a 76 y.o. female seen in the office today for INR check. Pt with hx of afib s/p MVR with mechanical value.  INR goal is 2.5-3.5 Pt recently hospitalized and INR was 4.2 with acute anemia. She was seen in office on 12/18 with INR of 3.6.  Currently taking 6 mg of coumadin daily which she states she has been taking since her last INR- unsure if this was on 12/18 or not but states it has been at least at week No bruising or bleeding. No changes in diet.  Complaint with medications- does pill box  No recent changes in medication   Review of Systems:  Review of Systems  Constitutional: Negative for chills and fever.  HENT: Negative for nosebleeds.   Respiratory: Negative for hemoptysis and shortness of breath.   Cardiovascular: Negative for chest pain.  Gastrointestinal: Negative for blood in stool.  Genitourinary: Negative for hematuria.  Neurological: Negative for weakness.    Past Medical History:  Diagnosis Date  . Broken wrist   . Depression   . Diarrhea   . High cholesterol   . Hypertension   . Memory difficulty 01/18/2016  . 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 Vertigo 01/18/2016   Past Surgical History:  Procedure Laterality Date  . HEMATOMA EVACUATION Left 11/25/2012   Procedure: EVACUATION HEMATOMA;  Surgeon: Linna Hoff, MD;  Location: [place];  Service: Orthopedics;  Laterality: Left;  . HIP PINNING,CANNULATED Left 11/03/2012   Procedure: CANNULATED HIP PINNING;  Surgeon: Tobi Bastos, MD;   Location[facility];  Service: Orthopedics;  Laterality: Left;  . 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  . OPEN REDUCTION INTERNAL FIXATION (ORIF) WRIST WITH ILIAC CREST BONE GRAFT Left 11/24/2012   Procedure: OPEN REDUCTION INTERNAL FIXATION LEFT WRIST POSSIBLE BONE GRAFTING AND PINNING (ORIF) ;  Surgeon: Linna Hoff, MD;  Location: [place];  Service: Orthopedics;  Laterality: Left;  . SHOULDER OPEN ROTATOR CUFF REPAIR     Social History:   reports that  has never smoked. she has never used smokeless tobacco. She reports that she drinks about 2.4 oz of alcohol per week. She reports that she does not use drugs.  Family History  Problem Relation Age of Onset  . Stroke Mother   . Hypertension Mother   . Stroke Father   . Colon cancer Paternal Grandmother   . Parkinsonism Brother     Medications:   Medication List        Accurate as of 05/21/17 11:00 AM. Always use your most recent med list.          alendronate 70 MG tablet Commonly known as:  FOSAMAX Take 1 tablet (70 mg total) by mouth every Sunday. Take with a full glass of water on an empty stomach.   atorvastatin 10 MG tablet Commonly known as:  LIPITOR Take 1 tablet (10 mg total) by mouth at bedtime.  camphor-menthol lotion Commonly known as:  SARNA Apply topically as needed for itching.   hypromellose 0.3 % Gel ophthalmic ointment Commonly known as:  SYSTANE OVERNIGHT THERAPY Place 1 application into both eyes at bedtime.   losartan 100 MG tablet Commonly known as:  COZAAR Take 1 tablet (100 mg total) by mouth daily.   metoprolol succinate 50 MG 24 hr tablet Commonly known as:  TOPROL-XL Take 1 tablet (50 mg total) by mouth daily.   Omega-3 Fish Oil 1200 MG Caps Take 2 capsules (2,400 mg total) by mouth 2 (two) times daily.   traZODone 50 MG tablet Commonly known as:  DESYREL   Vitamin D3 1000 units Caps Take 1 capsule (1,000 Units total) by mouth daily.   warfarin 6 MG tablet Commonly known  as:  COUMADIN        Physical Exam:  Vitals:   05/21/17 1054  BP: 136/84  Pulse: 71  Resp: 17  Temp: 97.9 F (36.6 C)  TempSrc: Oral  SpO2: 98%  Weight: 158 lb (71.7 kg)  Height: 5\' 3"  (1.6 m)   Body mass index is 27.99 kg/m.  Physical Exam  Constitutional: She is oriented to person, place, and time. She appears well-developed and well-nourished.  HENT:  Head: Normocephalic and atraumatic.  Eyes: Pupils are equal, round, and reactive to light.  Cardiovascular: Normal rate and regular rhythm.  Murmur heard. Pulmonary/Chest: Effort normal and breath sounds normal.  Neurological: She is alert and oriented to person, place, and time.  Skin: Skin is warm and dry.  Psychiatric: She has a normal mood and affect.    Labs reviewed: Basic Metabolic Panel: Recent Labs    05/06/17 0619 05/07/17 0645 05/08/17 0540  NA 131* 131* 134*  K 3.9 3.5 3.4*  CL 102 102 103  CO2 22 21* 24  GLUCOSE 106* 111* 100*  BUN 9 9 7   CREATININE 0.65 0.65 0.76  CALCIUM 8.1* 8.3* 8.6*   Liver Function Tests: Recent Labs    05/04/17 1319  AST 39  ALT 24  ALKPHOS 52  BILITOT 1.5*  PROT 6.3*  ALBUMIN 3.8   No results for input(s): LIPASE, AMYLASE in the last 8760 hours. No results for input(s): AMMONIA in the last 8760 hours. CBC: Recent Labs    05/04/17 1319 05/06/17 0852  05/08/17 0540 05/09/17 0949 05/12/17 1341  WBC 18.8* 11.7*   < > 9.0 8.4 7.6  NEUTROABS 16.7* 8.4*  --   --   --   --   HGB 10.8* 6.9*   < > 8.3* 8.9* 9.6*  HCT 31.7* 19.9*   < > 24.7* 26.9* 29.1*  MCV 84.8 85.8   < > 87.3 89.7 89.5  PLT 341 240   < > 270 330 377   < > = values in this interval not displayed.   Lipid Panel: No results for input(s): CHOL, HDL, LDLCALC, TRIG, CHOLHDL, LDLDIRECT in the last 8760 hours. TSH: No results for input(s): TSH in the last 8760 hours. A1C: No results found for: HGBA1C   Assessment/Plan 1. Long term (current) use of anticoagulants - POC INR- 4.1- above goal  2.5-3.5 -to start warfarin (COUMADIN) 6 MG tablet; To take 1/2 tablet 3 mg on 12/27 and 12/28, then to start 5 mg coumadin daily (pt already has tablets)  2. Paroxysmal atrial fibrillation (HCC) Rate controlled, currently maintained on coumain   Next appt: on 12/31 with Tivis Ringer for INR check [place] Norberto K. Harle Battiest  [facility] &  Adult Medicine [phone] 8 am - 5 pm) [phone] (after hours)

## 2017-05-21 NOTE — Patient Instructions (Signed)
To take coumadin 3 mg today (12/27) and tomorrow (12/28) Then start coumadin 5 mg daily (12/29)  To see Tivis Ringer, Pharm D on Monday  12/31 for recheck of INR and further instructions

## 2017-05-25 ENCOUNTER — Encounter: Payer: Self-pay | Admitting: Pharmacotherapy

## 2017-05-25 ENCOUNTER — Ambulatory Visit (INDEPENDENT_AMBULATORY_CARE_PROVIDER_SITE_OTHER): Payer: Medicare Other | Admitting: Pharmacotherapy

## 2017-05-25 VITALS — BP 132/78 | HR 78 | Resp 12 | Ht 63.0 in | Wt 160.0 lb

## 2017-05-25 DIAGNOSIS — Z952 Presence of prosthetic heart valve: Secondary | ICD-10-CM | POA: Diagnosis not present

## 2017-05-25 DIAGNOSIS — Z7901 Long term (current) use of anticoagulants: Secondary | ICD-10-CM

## 2017-05-25 DIAGNOSIS — I480 Paroxysmal atrial fibrillation: Secondary | ICD-10-CM | POA: Diagnosis not present

## 2017-05-25 LAB — POCT INR: INR: 2.6

## 2017-05-25 NOTE — Progress Notes (Signed)
   Subjective:    Patient ID: Jodi Frank, female    DOB: 02-21-1941, 76 y.o.   MRN: [id number]  HPI  Last INR was 4.1 on 05/21/17. She was told to take Coumadin 3mg  x 2 doses, then start 5mg  daily. Denies missed does. Denies unusual bleeding or bruising. Denies CP, palpitations,falls Consistent with vitamin K intake.  Review of Systems  HENT: Negative for nosebleeds.   Cardiovascular: Negative for chest pain and palpitations.  Gastrointestinal: Negative for anal bleeding and blood in stool.  Genitourinary: Negative for hematuria.  Hematological: Does not bruise/bleed easily.       Objective:   Physical Exam  Constitutional: She is oriented to person, place, and time. She appears well-developed and well-nourished.  HENT:  Right Ear: External ear normal.  Left Ear: External ear normal.  Cardiovascular: Normal rate, regular rhythm and normal heart sounds.  Pulmonary/Chest: Effort normal and breath sounds normal.  Neurological: She is alert and oriented to person, place, and time.  Skin: Skin is warm and dry.  Psychiatric: She has a normal mood and affect. Her behavior is normal. Judgment and thought content normal.  Vitals reviewed.    BP:  132/78  HR: 78  Wt: 160lb INR 2.6     Assessment & Plan:  1.   INR at goal 2-3 2.   Continue Coumadin 5mg  daily 3.   RTC 1 week

## 2017-05-27 ENCOUNTER — Telehealth: Payer: Self-pay

## 2017-05-27 NOTE — Telephone Encounter (Signed)
-----   Message from [place], [place] sent at 05/25/2017  1:40 PM EST ----- According to my last note, she is to f/u with me once she completes her short term rehab at [facility]. If she is done with rehab, she may make an appt for f/u  ----- Message ----- From: Logan Bores, CMA Sent: 05/25/2017  12:30 PM To: Gildardo Cranker, DO, Meshell Felicity Coyer, RMA  This patient was here to see Tye Maryland today and inquired about when should she follow-up with you.  Please advise

## 2017-05-27 NOTE — Telephone Encounter (Signed)
Left detailed message on voicemail with Dr.Carter's response.  Patient to return call

## 2017-05-29 ENCOUNTER — Ambulatory Visit: Payer: Medicare Other | Admitting: Internal Medicine

## 2017-06-01 ENCOUNTER — Ambulatory Visit (INDEPENDENT_AMBULATORY_CARE_PROVIDER_SITE_OTHER): Payer: Medicare Other | Admitting: Pharmacotherapy

## 2017-06-01 ENCOUNTER — Telehealth: Payer: Self-pay

## 2017-06-01 DIAGNOSIS — Z7901 Long term (current) use of anticoagulants: Secondary | ICD-10-CM | POA: Diagnosis not present

## 2017-06-01 DIAGNOSIS — I480 Paroxysmal atrial fibrillation: Secondary | ICD-10-CM

## 2017-06-01 DIAGNOSIS — Z952 Presence of prosthetic heart valve: Secondary | ICD-10-CM

## 2017-06-01 LAB — POCT INR: INR: 2.5

## 2017-06-01 NOTE — Progress Notes (Signed)
   Subjective:    Patient ID: Jodi Frank, female    DOB: 02/22/1941, 77 y.o.   MRN: [id number]  HPI Last INR on 05/25/17 was OK at 2.6 Current Coumadin dose is 5mg  QD Denies missed doses Denies unusual bleeding or bruising Denies CP, falls Consistent with vitamin K intake  Review of Systems  HENT: Negative for nosebleeds.   Cardiovascular: Negative for chest pain and palpitations.  Gastrointestinal: Negative for anal bleeding and blood in stool.  Genitourinary: Negative for hematuria.  Hematological: Does not bruise/bleed easily.       Objective:   Physical Exam  Constitutional: She is oriented to person, place, and time. She appears well-developed and well-nourished.  HENT:  Right Ear: External ear normal.  Left Ear: External ear normal.  Cardiovascular: Normal rate, regular rhythm and normal heart sounds.  Pulmonary/Chest: Effort normal and breath sounds normal.  Neurological: She is alert and oriented to person, place, and time.  Skin: Skin is warm and dry.  Psychiatric: She has a normal mood and affect. Her behavior is normal. Judgment and thought content normal.  Vitals reviewed.    BP:  136/78  HR: 64  Wt: 160lb INR 2.5     Assessment & Plan:  1.  INR at goal 2.5-3.5 2.  Continue Coumadin 5mg  QD 3.  RTC 1 month

## 2017-06-01 NOTE — Telephone Encounter (Signed)
Patient c/o constant dizziness while taking metoprolol. Patient takes medication in the morning about 9:30-10:00 am   Patient would like medication changed or something prescribed for dizziness.  Pharmacy on file confirmed   Patient aware Dr.Carter is out of office, ok for message to wait until tomorrow.    Marland Kitchen

## 2017-06-03 NOTE — Telephone Encounter (Signed)
50 mg once daily

## 2017-06-03 NOTE — Telephone Encounter (Signed)
What dose of metoprolol is she taking?

## 2017-06-04 NOTE — Telephone Encounter (Signed)
Ok for meclizine 25mg  #30 take 1 tab po daily for dizziness with 3 RF

## 2017-06-04 NOTE — Telephone Encounter (Signed)
Left message on voicemail for patient to return call when available   

## 2017-06-10 ENCOUNTER — Ambulatory Visit (INDEPENDENT_AMBULATORY_CARE_PROVIDER_SITE_OTHER): Payer: Medicare Other | Admitting: Internal Medicine

## 2017-06-10 ENCOUNTER — Encounter: Payer: Self-pay | Admitting: Internal Medicine

## 2017-06-10 VITALS — BP 148/92 | HR 75 | Temp 98.2°F | Ht 63.0 in | Wt 159.2 lb

## 2017-06-10 DIAGNOSIS — E782 Mixed hyperlipidemia: Secondary | ICD-10-CM | POA: Diagnosis not present

## 2017-06-10 DIAGNOSIS — I10 Essential (primary) hypertension: Secondary | ICD-10-CM | POA: Diagnosis not present

## 2017-06-10 DIAGNOSIS — I700 Atherosclerosis of aorta: Secondary | ICD-10-CM | POA: Diagnosis not present

## 2017-06-10 DIAGNOSIS — S0101XD Laceration without foreign body of scalp, subsequent encounter: Secondary | ICD-10-CM | POA: Diagnosis not present

## 2017-06-10 DIAGNOSIS — R42 Dizziness and giddiness: Secondary | ICD-10-CM

## 2017-06-10 DIAGNOSIS — G3184 Mild cognitive impairment, so stated: Secondary | ICD-10-CM | POA: Diagnosis not present

## 2017-06-10 DIAGNOSIS — Z7901 Long term (current) use of anticoagulants: Secondary | ICD-10-CM

## 2017-06-10 DIAGNOSIS — Z952 Presence of prosthetic heart valve: Secondary | ICD-10-CM | POA: Diagnosis not present

## 2017-06-10 DIAGNOSIS — I480 Paroxysmal atrial fibrillation: Secondary | ICD-10-CM

## 2017-06-10 DIAGNOSIS — D649 Anemia, unspecified: Secondary | ICD-10-CM | POA: Diagnosis not present

## 2017-06-10 MED ORDER — WARFARIN SODIUM 5 MG PO TABS: 5.0000 mg | ORAL_TABLET | Freq: Every day | ORAL | 0 refills | Status: DC

## 2017-06-10 MED ORDER — MECLIZINE HCL 25 MG PO TABS: 25.0000 mg | ORAL_TABLET | Freq: Every day | ORAL | 3 refills | Status: DC | PRN

## 2017-06-10 NOTE — Progress Notes (Signed)
Patient ID: Jodi Frank, female   DOB: February 04, 1941, 77 y.o.   MRN: [id number]   Location:  [facility] OFFICE  Provider: DR Arletha Grippe  Code Status:  Goals of Care:  Advanced Directives 05/04/2017  Does Patient Have a Medical Advance Directive? Yes  Type of Paramedic of [employer];Living will  Does patient want to make changes to medical advance directive? No - Patient declined  Copy of [place] in Chart? No - copy requested  Would patient like information on creating a medical advance directive? No - Patient declined  Pre-existing out of facility DNR order (yellow form or pink MOST form) -     Chief Complaint  Patient presents with  . Follow-up    Follow up from [facility], released about 2 weeks ago   . Medication Refill    Discuss medications disconituned in hospital- medication for itching, patient would like to restart this medication   . Medication Management    Metoprolol causing dizziness, patient questions if she needs this medication. Patient also questions if coffee is related to dizziness (on average patient drinks 2 cups daily)   . INR Concerns    Patient questions if an eletric blanket has any effect on PT/INR    HPI: Patient is a 77 y.o. female seen today for medical management of chronic diseases.  She reports feeling well since short term rehab discharge from [facility] 2 weeks ago. She is back to her nml activity level - volunteering, playing canasta and reading. No concerns today.  She has intermittent dizziness throughout the day but worse after taking metoprolol 25m daily. Meclizine helps. She does have hx dizziness unrelated to positional changes  She is a poor historian due to memory loss. Hx obtained from chart  She applies mederma on forehead laceration scar. It is healing well and scar is softening.   [facility] monitored by [facility]CP.   Fall hx - she falls at least 1 time per  yr. She drinks several glasses of wine at night which contributes to falls.  HTN - BP stable on losartan and metoprolol xl  Cognitive impairment -unchanged. 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 Takes coumadin. INR GOAL 2.5-3.5 due to mechanical valve. Rate controlled with metoprolol. 2D echo revealed EF 65-70% in Dec 2018. INR 2.5 on  06/01/17. [facility] She does not have a cardiologist at this time  Hx anemia - stable. Hgb 9.6  Osteoporosis - stable on fosamax. Last DXA 01/2016 revealed T score -2.9.  Hyperlipidemia - stakes lipitor and omega 3 fish oil. No recent lipid panel  Hx abdominal atherosclerosis - on coumadin. INR at goal  HM - Her last Td 06/2005; Zostavax 07/2013   Past Medical History:  Diagnosis Date  . Broken wrist   . Depression   . Diarrhea   . H/O long-term (current) use of anticoagulants   . High cholesterol   . Hypertension   . Memory difficulty 01/18/2016  . 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 Osteoporosis   . Vertigo 01/18/2016    Past Surgical History:  Procedure Laterality Date  . HEMATOMA EVACUATION Left 11/25/2012   Procedure: EVACUATION HEMATOMA;  Surgeon: FLinna Hoff MD;  Location: [facility]  Service: Orthopedics;  Laterality: Left;  . HIP PINNING,CANNULATED Left 11/03/2012   Procedure: CANNULATED HIP PINNING;  Surgeon: Tobi Bastos, MD;  Location: [facility];  Service: Orthopedics;  Laterality: Left;  . 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  . OPEN REDUCTION INTERNAL FIXATION (ORIF) WRIST WITH ILIAC CREST BONE GRAFT Left 11/24/2012   Procedure: OPEN REDUCTION INTERNAL FIXATION LEFT WRIST POSSIBLE BONE GRAFTING AND PINNING (ORIF) ;  Surgeon: Linna Hoff, MD;  Location: [facility];  Service: Orthopedics;  Laterality: Left;  . SHOULDER OPEN  ROTATOR CUFF REPAIR       reports that  has never smoked. she has never used smokeless tobacco. She reports that she drinks about 2.4 oz of alcohol per week. She reports that she does not use drugs. Social History   Socioeconomic History  . Marital status: Divorced    Spouse name: Not on file  . Number of children: 1  . Years of education: BS  . Highest education level: Not on file  Social Needs  . Financial resource strain: Not on file  . Food insecurity - worry: Not on file  . Food insecurity - inability: Not on file  . Transportation needs - medical: Not on file  . Transportation needs - non-medical: Not on file  Occupational History  . Occupation: Retired  Tobacco Use  . Smoking status: Never Smoker  . Smokeless tobacco: Never Used  Substance and Sexual Activity  . Alcohol use: Yes    Alcohol/week: 2.4 oz    Types: 4 Glasses of wine per week  . Drug use: No  . Sexual activity: Not Currently  Other Topics Concern  . Not on file  Social History Narrative   Lives at home, alone   Right-handed   Caffeine: 2 cups per day    Family History  Problem Relation Age of Onset  . Stroke Mother   . Hypertension Mother   . Stroke Father   . Colon cancer Paternal Grandmother   . Parkinsonism Brother     Allergies  Allergen Reactions  . Carvedilol Itching  . Ace Inhibitors Other (See Comments)    Reaction (??)  . Amlodipine Swelling  . Hydrochlorothiazide Other (See Comments)    Hyponatremia     Outpatient Encounter Medications as of 06/10/2017  Medication Sig  . alendronate (FOSAMAX) 70 MG tablet Take 1 tablet (70 mg total) by mouth every Sunday. Take with a full glass of water on an empty stomach.  Marland Kitchen atorvastatin (LIPITOR) 10 MG tablet Take 1 tablet (10 mg total) by mouth at bedtime.  . Cholecalciferol (VITAMIN D3) 1000 units CAPS Take 1 capsule (1,000 Units total) by mouth daily.  Marland Kitchen losartan (COZAAR) 100 MG tablet Take 1 tablet (100 mg total) by mouth daily.  .  meclizine (ANTIVERT) 25 MG tablet Take 1 tablet (25 mg total) by mouth daily as needed for dizziness.  . metoprolol succinate (TOPROL-XL) 50 MG 24 hr tablet Take 1 tablet (50 mg total) by mouth daily.  . Omega-3 Fatty Acids (OMEGA-3 FISH OIL) 1200 MG CAPS Take 2 capsules (2,400 mg total) by mouth 2 (two) times daily.  . traZODone (DESYREL) 50 MG tablet Take 50 mg by mouth as needed for sleep (1/2 to 1 tablet PRN).  Marland Kitchen warfarin (COUMADIN) 5 MG tablet Take 1 tablet (5 mg total) by mouth daily.  . [DISCONTINUED] warfarin (COUMADIN) 5 MG tablet Take 5 mg by mouth daily.  . [DISCONTINUED] meclizine (ANTIVERT) 25 MG  tablet Take 25 mg by mouth daily as needed for dizziness.   . [DISCONTINUED] warfarin (COUMADIN) 6 MG tablet To take 1/2 tablet 3 mg on 12/27 and 12/28, then to start 5 mg coumadin daily (pt already has tablets) (Patient not taking: Reported on 06/10/2017)   No facility-administered encounter medications on file as of 06/10/2017.     Review of Systems:  Review of Systems  Unable to perform ROS: Other (memory loss)    Health Maintenance  Topic Date Due  . DEXA SCAN  06/03/2007  . TETANUS/TDAP  07/10/2015  . COLONOSCOPY  09/20/2015  . INFLUENZA VACCINE  Completed  . PNA vac Low Risk Adult  Completed    Physical Exam: Vitals:   06/10/17 1119  BP: (!) 148/92  Pulse: 75  Temp: 98.2 F (36.8 C)  TempSrc: Oral  SpO2: 96%  Weight: 159 lb 3.2 oz (72.2 kg)  Height: 5' 3"  (1.6 m)   Body mass index is 28.2 kg/m. Physical Exam  Constitutional: She is oriented to person, place, and time. She appears well-developed and well-nourished.  HENT:  Mouth/Throat: Oropharynx is clear and moist. No oropharyngeal exudate.  MMM; no oral thrush  Eyes: Pupils are equal, round, and reactive to light. No scleral icterus.  Neck: Neck supple. Carotid bruit is not present. No tracheal deviation present. No thyromegaly present.  Cardiovascular: Normal rate and intact distal pulses. An irregularly  irregular rhythm present. Exam reveals no gallop and no friction rub.  Murmur (2/6 SEM) heard. Mitral valve click noted; no LE edema b/l. No calf TTP  Pulmonary/Chest: Effort normal and breath sounds normal. No stridor. No respiratory distress. She has no wheezes. She has no rales.  Abdominal: Soft. Normal appearance and bowel sounds are normal. She exhibits no distension and no mass. There is no hepatomegaly. There is no tenderness. There is no rigidity, no rebound and no guarding. No hernia.  Lymphadenopathy:    She has no cervical adenopathy.  Neurological: She is alert and oriented to person, place, and time. She has normal reflexes.  Skin: Skin is warm and dry. No rash noted.  Psychiatric: She has a normal mood and affect. Her behavior is normal. Judgment and thought content normal.    Labs reviewed: Basic Metabolic Panel: Recent Labs    02/27/17  05/06/17 0619 05/07/17 0645 05/08/17 0540  NA 130*   < > 131* 131* 134*  K 4.8   < > 3.9 3.5 3.4*  CL  --    < > 102 102 103  CO2  --    < > 22 21* 24  GLUCOSE  --    < > 106* 111* 100*  BUN 12   < > 9 9 7   CREATININE 0.7   < > 0.65 0.65 0.76  CALCIUM  --    < > 8.1* 8.3* 8.6*  TSH 0.62  --   --   --   --    < > = values in this interval not displayed.   Liver Function Tests: Recent Labs    02/27/17 05/04/17 1319  AST 24 39  ALT 26 24  ALKPHOS 59 52  BILITOT  --  1.5*  PROT  --  6.3*  ALBUMIN  --  3.8   No results for input(s): LIPASE, AMYLASE in the last 8760 hours. No results for input(s): AMMONIA in the last 8760 hours. CBC: Recent Labs    05/04/17 1319 05/06/17 0852  05/08/17 0540 05/09/17 0949 05/12/17 1341  WBC  18.8* 11.7*   < > 9.0 8.4 7.6  NEUTROABS 16.7* 8.4*  --   --   --   --   HGB 10.8* 6.9*   < > 8.3* 8.9* 9.6*  HCT 31.7* 19.9*   < > 24.7* 26.9* 29.1*  MCV 84.8 85.8   < > 87.3 89.7 89.5  PLT 341 240   < > 270 330 377   < > = values in this interval not displayed.   Lipid Panel: Recent Labs     02/27/17  CHOL 192  HDL 84*  LDLCALC 96  TRIG 62   No results found for: HGBA1C  Procedures since last visit: No results found.  Assessment/Plan   ICD-10-CM   1. Dizzinesses R42   2. H/O mitral valve replacement with mechanical valve Z95.2 CMP with eGFR([facility])  3. Paroxysmal atrial fibrillation (HCC) I48.0   4. Essential hypertension, benign I10 CMP with eGFR([facility])  5. Abdominal aortic atherosclerosis (HCC) I70.0   6. 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 Long term (current) use of anticoagulants Z79.01 CBC with Differential/Platelets  8. Laceration of scalp, subsequent encounter S01.01XD    healed; scar present  9. Mixed hyperlipidemia E78.2 Lipid Panel    TSH  10. Anemia, unspecified type D64.9 CBC with Differential/Platelets    Will call with referral to cardiology  Bone density due in 01/2018  CHANGE METOPROLOL TO NIGHTTIME DOSING  Continue other medications as ordered  Keep appt with Tivis Ringer for coumadin mx  Follow up in 4 mos cognitive impairment, afib, HTN, dizziness, falls. Fasting labs prior to appt    [place] S. Perlie Gold  [facility] [address] [place], [place] [address] [phone] Cell (Monday-Friday 8 AM - 5 PM) [phone] After 5 PM and follow prompts

## 2017-06-10 NOTE — Patient Instructions (Addendum)
Will call with referral to cardiology  Bone density due in 01/2018  CHANGE METOPROLOL TO NIGHTTIME DOSING  Continue other medications as ordered  Keep appt with Tivis Ringer for coumadin mx  Follow up in 4 mos cognitive impairment, afib, HTN, dizziness, falls. Fasting labs prior to appt

## 2017-06-12 DIAGNOSIS — H2513 Age-related nuclear cataract, bilateral: Secondary | ICD-10-CM | POA: Diagnosis not present

## 2017-06-12 DIAGNOSIS — H25013 Cortical age-related cataract, bilateral: Secondary | ICD-10-CM | POA: Diagnosis not present

## 2017-06-12 DIAGNOSIS — H04123 Dry eye syndrome of bilateral lacrimal glands: Secondary | ICD-10-CM | POA: Diagnosis not present

## 2017-06-12 DIAGNOSIS — H40013 Open angle with borderline findings, low risk, bilateral: Secondary | ICD-10-CM | POA: Diagnosis not present

## 2017-06-18 ENCOUNTER — Ambulatory Visit: Payer: Medicare Other | Admitting: Cardiology

## 2017-06-18 ENCOUNTER — Encounter: Payer: Self-pay | Admitting: Internal Medicine

## 2017-06-29 ENCOUNTER — Ambulatory Visit (INDEPENDENT_AMBULATORY_CARE_PROVIDER_SITE_OTHER): Payer: Medicare Other | Admitting: Pharmacotherapy

## 2017-06-29 ENCOUNTER — Encounter: Payer: Self-pay | Admitting: Pharmacotherapy

## 2017-06-29 DIAGNOSIS — Z7901 Long term (current) use of anticoagulants: Secondary | ICD-10-CM

## 2017-06-29 DIAGNOSIS — Z952 Presence of prosthetic heart valve: Secondary | ICD-10-CM

## 2017-06-29 DIAGNOSIS — I480 Paroxysmal atrial fibrillation: Secondary | ICD-10-CM | POA: Diagnosis not present

## 2017-06-29 LAB — POCT INR: INR: 6.9

## 2017-06-29 NOTE — Progress Notes (Signed)
   Subjective:    Patient ID: Jodi Frank, female    DOB: 04-07-1941, 77 y.o.   MRN: [id number]  HPI Last INR on 06/01/17 was OK at 2.5 Current Coumadin dose is 5mg  QD. Denies unusual bleeding or bruising Denies CP, falls Consistent with vitamin K intake. Did have wine last night - this is unusual for her.   Review of Systems  HENT: Negative for nosebleeds.   Cardiovascular: Negative for chest pain.  Gastrointestinal: Negative for anal bleeding and blood in stool.  Genitourinary: Negative for hematuria.  Hematological: Does not bruise/bleed easily.       Objective:   Physical Exam  Constitutional: She is oriented to person, place, and time. She appears well-developed and well-nourished.  HENT:  Right Ear: External ear normal.  Left Ear: External ear normal.  Cardiovascular: Normal rate.  Pulmonary/Chest: Effort normal and breath sounds normal.  Neurological: She is alert and oriented to person, place, and time.  Skin: Skin is warm and dry.  Psychiatric: She has a normal mood and affect. Her behavior is normal. Judgment and thought content normal.  Vitals reviewed.   BP: 124/80  HR: 66  Wt: 164lb INR 6.9      Assessment & Plan:  1.  INR above goal 2.5-3.5 due to wine / warfarin interaction. 2.  Hold warfarin x 2 doses, then resume 5mg  daily 3.  Repeat INR in 2 weeks

## 2017-06-30 ENCOUNTER — Ambulatory Visit: Payer: Medicare Other | Admitting: Cardiovascular Disease

## 2017-07-08 ENCOUNTER — Ambulatory Visit (INDEPENDENT_AMBULATORY_CARE_PROVIDER_SITE_OTHER): Payer: Medicare Other | Admitting: Internal Medicine

## 2017-07-08 ENCOUNTER — Encounter: Payer: Self-pay | Admitting: Internal Medicine

## 2017-07-08 VITALS — BP 146/90 | HR 81 | Temp 97.8°F | Ht 63.0 in | Wt 163.0 lb

## 2017-07-08 DIAGNOSIS — M79661 Pain in right lower leg: Secondary | ICD-10-CM | POA: Diagnosis not present

## 2017-07-08 DIAGNOSIS — M5431 Sciatica, right side: Secondary | ICD-10-CM

## 2017-07-08 DIAGNOSIS — L03031 Cellulitis of right toe: Secondary | ICD-10-CM

## 2017-07-08 MED ORDER — TIZANIDINE HCL 4 MG PO TABS: 4.0000 mg | ORAL_TABLET | Freq: Three times a day (TID) | ORAL | 0 refills | Status: DC | PRN

## 2017-07-08 MED ORDER — HYDROCODONE-ACETAMINOPHEN 5-325 MG PO TABS: 1.0000 | ORAL_TABLET | Freq: Four times a day (QID) | ORAL | 0 refills | Status: DC | PRN

## 2017-07-08 NOTE — Patient Instructions (Signed)
Will call with [place] appt for leg pain and swelling to check for blood clot  START ZANAFLEX 2MG  3 TIMES DAILY AS NEEDED FOR MUSCLE SPASM  START NORCO 5/325 TAKE 1 TAB EVERY 6 HRS AS NEEDED FOR PAIN. MAY TAKE 1/2 TABLET IF WHOLE TOO STRONG  EPSOM SALT WARM SOAKS AT LEAST 3 TIMES DAILY X 1 WEEK. If no better or if swelling and pain worsens, call office. May need podiatry evaluation  Follow up as scheduled or sooner if need be   Sciatica Sciatica is pain, numbness, weakness, or tingling along your sciatic nerve. The sciatic nerve starts in the lower back and goes down the back of each leg. Sciatica happens when this nerve is pinched or has pressure put on it. Sciatica usually goes away on its own or with treatment. Sometimes, sciatica may keep coming back (recur). Follow these instructions at home: Medicines  Take over-the-counter and prescription medicines only as told by your doctor.  Do not drive or use heavy machinery while taking prescription pain medicine. Managing pain  If directed, put ice on the affected area. ? Put ice in a plastic bag. ? Place a towel between your skin and the bag. ? Leave the ice on for 20 minutes, 2-3 times a day.  After icing, apply heat to the affected area before you exercise or as often as told by your doctor. Use the heat source that your doctor tells you to use, such as a moist heat pack or a heating pad. ? Place a towel between your skin and the heat source. ? Leave the heat on for 20-30 minutes. ? Remove the heat if your skin turns bright red. This is especially important if you are unable to feel pain, heat, or cold. You may have a greater risk of getting burned. Activity  Return to your normal activities as told by your doctor. Ask your doctor what activities are safe for you. ? Avoid activities that make your sciatica worse.  Take short rests during the day. Rest in a lying or standing position. This is usually better than sitting to rest. ? When  you rest for a long time, do some physical activity or stretching between periods of rest. ? Avoid sitting for a long time without moving. Get up and move around at least one time each hour.  Exercise and stretch regularly, as told by your doctor.  Do not lift anything that is heavier than 10 lb (4.5 kg) while you have symptoms of sciatica. ? Avoid lifting heavy things even when you do not have symptoms. ? Avoid lifting heavy things over and over.  When you lift objects, always lift in a way that is safe for your body. To do this, you should: ? Bend your knees. ? Keep the object close to your body. ? Avoid twisting. General instructions  Use good posture. ? Avoid leaning forward when you are sitting. ? Avoid hunching over when you are standing.  Stay at a healthy weight.  Wear comfortable shoes that support your feet. Avoid wearing high heels.  Avoid sleeping on a mattress that is too soft or too hard. You might have less pain if you sleep on a mattress that is firm enough to support your back.  Keep all follow-up visits as told by your doctor. This is important. Contact a doctor if:  You have pain that: ? Wakes you up when you are sleeping. ? Gets worse when you lie down. ? Is worse than the  pain you have had in the past. ? Lasts longer than 4 weeks.  You lose weight for without trying. Get help right away if:  You cannot control when you pee (urinate) or poop (have a bowel movement).  You have weakness in any of these areas and it gets worse. ? Lower back. ? Lower belly (pelvis). ? Butt (buttocks). ? Legs.  You have redness or swelling of your back.  You have a burning feeling when you pee. This information is not intended to replace advice given to you by your health care provider. Make sure you discuss any questions you have with your health care provider. Document Released: 02/19/2008 Document Revised: 10/18/2015 Document Reviewed: 01/19/2015 Elsevier  Interactive Patient Education  2018 [place] An ingrown toenail occurs when the corner or sides of your toenail grow into the surrounding skin. The big toe is most commonly affected, but it can happen to any of your toes. If your ingrown toenail is not treated, you will be at risk for infection. What are the causes? This condition may be caused by:  Wearing shoes that are too small or tight.  Injury or trauma, such as stubbing your toe or having your toe stepped on.  Improper cutting or care of your toenails.  Being born with (congenital) nail or foot abnormalities, such as having a nail that is too big for your toe.  What increases the risk? Risk factors for an ingrown toenail include:  Age. Your nails tend to thicken as you get older, so ingrown nails are more common in older people.  Diabetes.  Cutting your toenails incorrectly.  Blood circulation problems.  What are the signs or symptoms? Symptoms may include:  Pain, soreness, or tenderness.  Redness.  Swelling.  Hardening of the skin surrounding the toe.  Your ingrown toenail may be infected if there is fluid, pus, or drainage. How is this diagnosed? An ingrown toenail may be diagnosed by medical history and physical exam. If your toenail is infected, your health care provider may test a sample of the drainage. How is this treated? Treatment depends on the severity of your ingrown toenail. Some ingrown toenails may be treated at home. More severe or infected ingrown toenails may require surgery to remove all or part of the nail. Infected ingrown toenails may also be treated with antibiotic medicines. Follow these instructions at home:  If you were prescribed an antibiotic medicine, finish all of it even if you start to feel better.  Soak your foot in warm soapy water for 20 minutes, 3 times per day or as directed by your health care provider.  Carefully lift the edge of the nail away from the  sore skin by wedging a small piece of cotton under the corner of the nail. This may help with the pain. Be careful not to cause more injury to the area.  Wear shoes that fit well. If your ingrown toenail is causing you pain, try wearing sandals, if possible.  Trim your toenails regularly and carefully. Do not cut them in a curved shape. Cut your toenails straight across. This prevents injury to the skin at the corners of the toenail.  Keep your feet clean and dry.  If you are having trouble walking and are given crutches by your health care provider, use them as directed.  Do not pick at your toenail or try to remove it yourself.  Take medicines only as directed by your health care provider.  Keep all  follow-up visits as directed by your health care provider. This is important. Contact a health care provider if:  Your symptoms do not improve with treatment. Get help right away if:  You have red streaks that start at your foot and go up your leg.  You have a fever.  You have increased redness, swelling, or pain.  You have fluid, blood, or pus coming from your toenail. This information is not intended to replace advice given to you by your health care provider. Make sure you discuss any questions you have with your health care provider. Document Released: 05/09/2000 Document Revised: 10/12/2015 Document Reviewed: 04/05/2014 Elsevier Interactive Patient Education  [employer].

## 2017-07-08 NOTE — Progress Notes (Signed)
Patient ID: Jodi Frank, female   DOB: 04-Nov-1940, 77 y.o.   MRN: [id number]   [facility] OFFICE  Provider: DR Arletha Grippe  Code Status:  Goals of Care:  Advanced Directives 05/04/2017  Does Patient Have a Medical Advance Directive? Yes  Type of Paramedic of [place];Living will  Does patient want to make changes to medical advance directive? No - Patient declined  Copy of [place] in Chart? No - copy requested  Would patient like information on creating a medical advance directive? No - Patient declined  Pre-existing out of facility DNR order (yellow form or pink MOST form) -     Chief Complaint  Patient presents with  . Acute Visit    Muscle aches in right leg, twitching keeps patient up at night. Patient tried old rx (Hydrocodone 5-325mg ) that expired in 2015. Patient thinks pain medication is helping a little although expired. Patient also c/o ingrown toenail on right foot.   . Medication Refill    No refills needed     HPI: Patient is a 77 y.o. female seen today for an acute visit for right 1st toenail ingrown and right back pain --> right leg. Pain in calf and posterior thigh achy x 5 days. No known trauma. No change in activity level. No numbness or tingling. No falls. Tried expired Rx of hydrocodone which helped a little. She is a poor historian due to memory loss. Hx obtained from chart. She denies f/c.  Past Medical History:  Diagnosis Date  . Broken wrist   . Depression   . Diarrhea   . H/O long-term (current) use of anticoagulants   . High cholesterol   . Hypertension   . Memory difficulty 01/18/2016  . 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 Osteoporosis   . Vertigo 01/18/2016    Past Surgical History:  Procedure Laterality Date  . HEMATOMA EVACUATION Left 11/25/2012   Procedure: EVACUATION HEMATOMA;  Surgeon: Linna Hoff, MD;  Location: [facility];  Service: Orthopedics;  Laterality: Left;  . HIP PINNING,CANNULATED  Left 11/03/2012   Procedure: CANNULATED HIP PINNING;  Surgeon: Tobi Bastos, MD;  Location: [facility];  Service: Orthopedics;  Laterality: Left;  . 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  . OPEN REDUCTION INTERNAL FIXATION (ORIF) WRIST WITH ILIAC CREST BONE GRAFT Left 11/24/2012   Procedure: OPEN REDUCTION INTERNAL FIXATION LEFT WRIST POSSIBLE BONE GRAFTING AND PINNING (ORIF) ;  Surgeon: Linna Hoff, MD;  Location: [facility];  Service: Orthopedics;  Laterality: Left;  . SHOULDER OPEN ROTATOR CUFF REPAIR       reports that  has never smoked. she has never used smokeless tobacco. She reports that she does not drink alcohol or use drugs. Social History   Socioeconomic History  . Marital status: Divorced    Spouse name: Not on file  . Number of children: 1  . Years of education: BS  . Highest education level: Not on file  Social Needs  . Financial resource strain: Not on file  . Food insecurity - worry: Not on file  . Food insecurity - inability: Not on file  . Transportation needs - medical: Not on file  . Transportation needs - non-medical: Not on file  Occupational History  . Occupation: Retired  Tobacco Use  . Smoking status: Never Smoker  . Smokeless tobacco: Never Used  Substance and Sexual Activity  . Alcohol use: No  Alcohol/week: 2.4 oz    Types: 4 Glasses of wine per week    Frequency: Never  . Drug use: No  . Sexual activity: Not Currently  Other Topics Concern  . Not on file  Social History Narrative   Lives at home, alone   Right-handed   Caffeine: 2 cups per day    Family History  Problem Relation Age of Onset  . Stroke Mother   . Hypertension Mother   . Stroke Father   . Colon cancer Paternal Grandmother   . Parkinsonism Brother     Allergies  Allergen Reactions  . Carvedilol Itching  . Ace Inhibitors Other (See Comments)    Reaction (??)  . Amlodipine Swelling  . Hydrochlorothiazide Other (See Comments)    Hyponatremia      Outpatient Encounter Medications as of 07/08/2017  Medication Sig  . alendronate (FOSAMAX) 70 MG tablet Take 1 tablet (70 mg total) by mouth every Sunday. Take with a full glass of water on an empty stomach.  Marland Kitchen atorvastatin (LIPITOR) 10 MG tablet Take 1 tablet (10 mg total) by mouth at bedtime.  . Cholecalciferol (VITAMIN D3) 1000 units CAPS Take 1 capsule (1,000 Units total) by mouth daily.  Marland Kitchen losartan (COZAAR) 100 MG tablet Take 1 tablet (100 mg total) by mouth daily.  . meclizine (ANTIVERT) 25 MG tablet Take 1 tablet (25 mg total) by mouth daily as needed for dizziness.  . metoprolol succinate (TOPROL-XL) 50 MG 24 hr tablet Take 1 tablet (50 mg total) by mouth daily.  . Omega-3 Fatty Acids (OMEGA-3 FISH OIL) 1200 MG CAPS Take 2 capsules (2,400 mg total) by mouth 2 (two) times daily.  . traZODone (DESYREL) 50 MG tablet Take 50 mg by mouth as needed for sleep (1/2 to 1 tablet PRN).  Marland Kitchen warfarin (COUMADIN) 5 MG tablet Take 1 tablet (5 mg total) by mouth daily.   No facility-administered encounter medications on file as of 07/08/2017.     Review of Systems:  Review of Systems  Unable to perform ROS: Other (memory loss)    Health Maintenance  Topic Date Due  . DEXA SCAN  06/03/2007  . TETANUS/TDAP  07/10/2015  . COLONOSCOPY  09/20/2015  . INFLUENZA VACCINE  Completed  . PNA vac Low Risk Adult  Completed    Physical Exam: Vitals:   07/08/17 0946  BP: (!) 146/90  Pulse: 81  Temp: 97.8 F (36.6 C)  TempSrc: Oral  SpO2: 96%  Weight: 163 lb (73.9 kg)  Height: 5\' 3"  (1.6 m)   Body mass index is 28.87 kg/m. Physical Exam  Constitutional: She appears well-developed and well-nourished.  Cardiovascular:  Neg Homan's sign; right medial leg TTP with palpable nodule; varicose veins present, soft.  Musculoskeletal: She exhibits edema and tenderness.       Lumbar back: She exhibits decreased range of motion, tenderness and spasm. She exhibits no bony tenderness.        Back:  Right piriformis TTP; right PSIS TTP  Neurological: She is alert. Gait (antalgic) abnormal.  Skin: Skin is warm and dry. No rash noted.  Right 1st toenail medial swelling and TTP with dried blood noted at insertion site. No d/c or increased redness. Toenail intact.  Psychiatric: She has a normal mood and affect. Her behavior is normal. Judgment and thought content normal.    Labs reviewed: Basic Metabolic Panel: Recent Labs    02/27/17  05/06/17 0619 05/07/17 0645 05/08/17 0540  NA 130*   < > 131*  131* 134*  K 4.8   < > 3.9 3.5 3.4*  CL  --    < > 102 102 103  CO2  --    < > 22 21* 24  GLUCOSE  --    < > 106* 111* 100*  BUN 12   < > 9 9 7   CREATININE 0.7   < > 0.65 0.65 0.76  CALCIUM  --    < > 8.1* 8.3* 8.6*  TSH 0.62  --   --   --   --    < > = values in this interval not displayed.   Liver Function Tests: Recent Labs    02/27/17 05/04/17 1319  AST 24 39  ALT 26 24  ALKPHOS 59 52  BILITOT  --  1.5*  PROT  --  6.3*  ALBUMIN  --  3.8   No results for input(s): LIPASE, AMYLASE in the last 8760 hours. No results for input(s): AMMONIA in the last 8760 hours. CBC: Recent Labs    05/04/17 1319 05/06/17 0852  05/08/17 0540 05/09/17 0949 05/12/17 1341  WBC 18.8* 11.7*   < > 9.0 8.4 7.6  NEUTROABS 16.7* 8.4*  --   --   --   --   HGB 10.8* 6.9*   < > 8.3* 8.9* 9.6*  HCT 31.7* 19.9*   < > 24.7* 26.9* 29.1*  MCV 84.8 85.8   < > 87.3 89.7 89.5  PLT 341 240   < > 270 330 377   < > = values in this interval not displayed.   Lipid Panel: Recent Labs    02/27/17  CHOL 192  HDL 84*  LDLCALC 96  TRIG 62   No results found for: HGBA1C  Procedures since last visit: No results found.  Assessment/Plan   ICD-10-CM   1. Paronychia of great toe of right foot L03.031   2. Right sided sciatica M54.31 HYDROcodone-acetaminophen (NORCO) 5-325 MG tablet    tiZANidine (ZANAFLEX) 4 MG tablet  3. Right calf pain M79.661 VAS [place] LOWER EXTREMITY VENOUS (DVT)   Will call  with [place] appt for leg pain and swelling to check for blood clot  START ZANAFLEX 2MG  3 TIMES DAILY AS NEEDED FOR MUSCLE SPASM  START NORCO 5/325 TAKE 1 TAB EVERY 6 HRS AS NEEDED FOR PAIN. MAY TAKE 1/2 TABLET IF WHOLE TOO STRONG   May need PT if sciatica not better with medical treatment  EPSOM SALT WARM SOAKS AT LEAST 3 TIMES DAILY X 1 WEEK. If no better or if swelling and pain worsens, call office. May need podiatry evaluation if no better  Follow up as scheduled or sooner if need be  Handout given on sciatica and ingrown toenail   Daesean Lazarz S. Perlie Gold  [facility] [address] [place],  [address] [phone] Cell (Monday-Friday 8 AM - 5 PM) [phone] After 5 PM and follow prompts

## 2017-07-09 ENCOUNTER — Encounter (HOSPITAL_COMMUNITY): Payer: Self-pay | Admitting: Emergency Medicine

## 2017-07-09 ENCOUNTER — Emergency Department (HOSPITAL_COMMUNITY)
Admission: EM | Admit: 2017-07-09 | Discharge: 2017-07-09 | Disposition: A | Discharge status: Home / Self Care | Payer: Medicare Other | Source: Home / Self Care | Attending: Emergency Medicine | Admitting: Emergency Medicine

## 2017-07-09 ENCOUNTER — Encounter: Payer: Self-pay | Admitting: Internal Medicine

## 2017-07-09 DIAGNOSIS — Z7901 Long term (current) use of anticoagulants: Secondary | ICD-10-CM

## 2017-07-09 DIAGNOSIS — Z79899 Other long term (current) drug therapy: Secondary | ICD-10-CM

## 2017-07-09 DIAGNOSIS — Z66 Do not resuscitate: Secondary | ICD-10-CM | POA: Diagnosis not present

## 2017-07-09 DIAGNOSIS — I480 Paroxysmal atrial fibrillation: Secondary | ICD-10-CM | POA: Insufficient documentation

## 2017-07-09 DIAGNOSIS — M5441 Lumbago with sciatica, right side: Secondary | ICD-10-CM | POA: Diagnosis not present

## 2017-07-09 DIAGNOSIS — M5431 Sciatica, right side: Secondary | ICD-10-CM

## 2017-07-09 DIAGNOSIS — I10 Essential (primary) hypertension: Secondary | ICD-10-CM

## 2017-07-09 DIAGNOSIS — M79604 Pain in right leg: Secondary | ICD-10-CM | POA: Diagnosis not present

## 2017-07-09 DIAGNOSIS — E222 Syndrome of inappropriate secretion of antidiuretic hormone: Secondary | ICD-10-CM | POA: Diagnosis not present

## 2017-07-09 DIAGNOSIS — N390 Urinary tract infection, site not specified: Secondary | ICD-10-CM | POA: Diagnosis not present

## 2017-07-09 DIAGNOSIS — D62 Acute posthemorrhagic anemia: Secondary | ICD-10-CM | POA: Diagnosis not present

## 2017-07-09 DIAGNOSIS — K828 Other specified diseases of gallbladder: Secondary | ICD-10-CM | POA: Diagnosis not present

## 2017-07-09 DIAGNOSIS — G9341 Metabolic encephalopathy: Secondary | ICD-10-CM | POA: Diagnosis not present

## 2017-07-09 DIAGNOSIS — M48061 Spinal stenosis, lumbar region without neurogenic claudication: Secondary | ICD-10-CM | POA: Diagnosis not present

## 2017-07-09 MED ORDER — DEXAMETHASONE 4 MG PO TABS
8.0000 mg | ORAL_TABLET | Freq: Once | ORAL | Status: AC
  Administered 2017-07-09: 8 mg via ORAL
  Filled 2017-07-09: qty 2

## 2017-07-09 MED ORDER — OXYCODONE-ACETAMINOPHEN 5-325 MG PO TABS
1.0000 | ORAL_TABLET | Freq: Once | ORAL | Status: DC
  Filled 2017-07-09: qty 1

## 2017-07-09 NOTE — Discharge Instructions (Signed)
Please read instructions below.  Talk with your PCP about any new medications, and follow up on your visit today. You can take 1-2 tabs of your hydrocodone every 4-6 hours as needed for pain.  Continue taking zanaflex as prescribed. Apply heat to your buttock for relief of pain. Return to ER if new numbness or tingling in your arms or legs, inability to urinate, inability to hold your bowels, or weakness in your extremities.

## 2017-07-09 NOTE — ED Triage Notes (Signed)
Patient c/o sciatic pain that gotten worse over the past couple days and PCP told to go to ED.

## 2017-07-09 NOTE — Telephone Encounter (Signed)
Message can be sent directly back to the patient from the provider. I copied Dr.Carter's response and sent it back to the patient to answer  Awaiting response

## 2017-07-09 NOTE — Telephone Encounter (Signed)
Dr.Carter please advise, patient also called to stress the urgency of message. Patient states she is in so much pain and she needs something sent to pharmacy today.

## 2017-07-09 NOTE — ED Provider Notes (Signed)
[facility] DEPT Provider Note   CSN: [id number] Arrival date & time: 07/09/17  1724     History   Chief Complaint Chief Complaint  Patient presents with  . Sciatica    HPI Jodi Frank is a 77 y.o. female past medical history of A. fib on Coumadin, hypertension, presenting to the ED for right-sided sciatica pain.  Patient was seen by her PCP yesterday, and diagnosed with sciatica, prescribed Norco and Zanaflex.  Patient states symptoms have not been improved with these medications.  Denies previous history of sciatica, however states symptoms started on Monday with sharp/aching pain in the right buttock radiating down the posterior leg.  She states she is unable to get comfortable sitting or lying down, due to pain in the right buttock.  She has been applying a heating pad that provides relief of symptoms, however it is temporary.  Denies numbness or tingling down legs, bowel or bladder incontinence, saddle paresthesia, abdominal pain, history of cancer or IV drug use, fever.  The history is provided by the patient.    Past Medical History:  Diagnosis Date  . Broken hip ([place]) 10/2011   left  . Broken wrist   . Depression   . Diarrhea   . H/O long-term (current) use of anticoagulants   . High cholesterol   . Hypertension   . Memory difficulty 01/18/2016  . 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 Osteoporosis   . SIADH (syndrome of inappropriate ADH production) ([place])   . Vertigo 01/18/2016    Patient Active Problem List   Diagnosis Date Noted  . Anemia 05/15/2017  . Paroxysmal atrial fibrillation ([place]) 05/15/2017  . Abdominal aortic atherosclerosis ([place]) 05/06/2017  . 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 Mild cognitive impairment 05/06/2017  . H/O mitral valve replacement with mechanical valve 05/06/2017  . Stage 1 skin ulcer of sacral region ([place]) 05/05/2017  . Scalp laceration 05/05/2017  . Acute  encephalopathy 05/05/2017  . Fall 05/04/2017  . Intolerance of drug 07/03/2016  . Memory difficulty 01/18/2016  . Vertigo 01/18/2016  . Mixed hyperlipidemia 07/12/2014  . Thiazide diuretic adverse reaction 01/03/2014  . Osteoporosis 01/14/2013  . External hemorrhoids 12/24/2012  . Hip fracture, left S/P cannulated hip pinning 11/10/2012  . Insomnia 11/10/2012  . Muscle spasm 11/10/2012  . Essential hypertension, benign 11/10/2012  . Constipation 11/10/2012  . Distal radius fracture, left 11/10/2012  . Long term (current) use of anticoagulants 11/10/2012  . Femoral neck fracture ([place]) 11/10/2012  . Acute blood loss anemia 11/05/2012  . Overactive bladder 09/05/2011  . Rosacea 09/04/2011  . 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 DIARRHEA 01/30/2010  . DEPRESSION 12/08/2006  . ALLERGIC RHINITIS 12/08/2006    Past Surgical History:  Procedure Laterality Date  . HEMATOMA EVACUATION Left 11/25/2012   Procedure: EVACUATION HEMATOMA;  Surgeon: Linna Hoff, MD;  Location: [facility];  Service: Orthopedics;  Laterality: Left;  . HIP PINNING,CANNULATED Left 11/03/2012   Procedure: CANNULATED HIP PINNING;  Surgeon: Tobi Bastos, MD;  Location: [facility];  Service: Orthopedics;  Laterality: Left;  . 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  . OPEN REDUCTION INTERNAL FIXATION (ORIF) WRIST WITH ILIAC CREST BONE GRAFT Left 11/24/2012   Procedure: OPEN REDUCTION INTERNAL FIXATION LEFT WRIST POSSIBLE BONE GRAFTING AND PINNING (ORIF) ;  Surgeon: Linna Hoff, MD;  Location: [facility];  Service: Orthopedics;  Laterality: Left;  . SHOULDER  OPEN ROTATOR CUFF REPAIR      OB History    No data available       Home Medications    Prior to Admission medications   Medication Sig Start Date End Date Taking? Authorizing Provider  alendronate (FOSAMAX) 70 MG tablet Take 1 tablet (70 mg total) by mouth every Sunday. Take with a full glass of water on an empty stomach. 05/10/17   Alphonzo Grieve,  MD  atorvastatin (LIPITOR) 10 MG tablet Take 1 tablet (10 mg total) by mouth at bedtime. 05/09/17   Alphonzo Grieve, MD  Cholecalciferol (VITAMIN D3) 1000 units CAPS Take 1 capsule (1,000 Units total) by mouth daily. 05/09/17   Alphonzo Grieve, MD  HYDROcodone-acetaminophen (NORCO) 5-325 MG tablet Take 1 tablet by mouth every 6 (six) hours as needed for moderate pain or severe pain. May cut tablet in half 07/08/17   Gildardo Cranker, DO  losartan (COZAAR) 100 MG tablet Take 1 tablet (100 mg total) by mouth daily. 05/09/17 09/18/18  Alphonzo Grieve, MD  meclizine (ANTIVERT) 25 MG tablet Take 1 tablet (25 mg total) by mouth daily as needed for dizziness. 06/10/17   Gildardo Cranker, DO  metoprolol succinate (TOPROL-XL) 50 MG 24 hr tablet Take 1 tablet (50 mg total) by mouth daily. 05/09/17   Alphonzo Grieve, MD  Omega-3 Fatty Acids (OMEGA-3 FISH OIL) 1200 MG CAPS Take 2 capsules (2,400 mg total) by mouth 2 (two) times daily. 05/09/17   Alphonzo Grieve, MD  tiZANidine (ZANAFLEX) 4 MG tablet Take 1 tablet (4 mg total) by mouth 3 (three) times daily as needed for muscle spasms. May cut tablet in half. 07/08/17   Gildardo Cranker, DO  traZODone (DESYREL) 50 MG tablet Take 50 mg by mouth as needed for sleep (1/2 to 1 tablet PRN).    [provider]  warfarin (COUMADIN) 5 MG tablet Take 1 tablet (5 mg total) by mouth daily. 06/10/17   Gildardo Cranker, DO    Family History Family History  Problem Relation Age of Onset  . Stroke Mother   . Hypertension Mother   . Stroke Father   . Colon cancer Paternal Grandmother   . Parkinsonism Brother     Social History Social History   Tobacco Use  . Smoking status: Never Smoker  . Smokeless tobacco: Never Used  Substance Use Topics  . Alcohol use: No    Alcohol/week: 2.4 oz    Types: 4 Glasses of wine per week    Frequency: Never  . Drug use: No     Allergies   Carvedilol; Ace inhibitors; Amlodipine; and Hydrochlorothiazide   Review of  Systems Review of Systems  Constitutional: Negative for fever.  Gastrointestinal: Negative for abdominal pain.       No bowel incontinence  Genitourinary: Negative for difficulty urinating.  Musculoskeletal: Positive for back pain and myalgias.  Neurological: Negative for weakness and numbness.  Hematological: Bruises/bleeds easily (Coumadin).  All other systems reviewed and are negative.    Physical Exam Updated Vital Signs BP (!) 174/88 (BP Location: Left Arm)   Pulse 74   Temp 98.4 F (36.9 C) (Oral)   Resp 18   Ht 5\' 3"  (1.6 m)   Wt 73.9 kg (163 lb)   SpO2 95%   BMI 28.87 kg/m   Physical Exam  Constitutional: She appears well-developed and well-nourished.  Patient appears uncomfortable, repeat of the going from sitting to standing position, and putting her weight on her left buttock.  HENT:  Head: Normocephalic and  atraumatic.  Eyes: Conjunctivae are normal.  Cardiovascular: Normal rate and intact distal pulses.  Pulmonary/Chest: Effort normal.  Abdominal: Soft. Bowel sounds are normal. She exhibits no distension. There is no tenderness.  Musculoskeletal:  No spinal or paraspinal tenderness, no bony step-offs, no gross deformities.  Right gluteal region with tenderness, extending down posterior thigh, posterior calf and lateral calf.  Neurological: She is alert.  Normal tone. 5/5 in lower extremities bilaterally including strong and equal dorsiflexion/plantar flexion Sensory: Pinprick and light touch normal in all extremities.  Deep Tendon Reflexes: 2+ and symmetric in the biceps and patella Gait: antalgic CV: distal pulses palpable throughout    Skin: Skin is warm.  Psychiatric: She has a normal mood and affect. Her behavior is normal.  Nursing note and vitals reviewed.    ED Treatments / Results  Labs (all labs ordered are listed, but only abnormal results are displayed) Labs Reviewed - No data to display  EKG  EKG Interpretation None        Radiology No results found.  Procedures Procedures (including critical care time)  Medications Ordered in ED Medications  dexamethasone (DECADRON) tablet 8 mg (not administered)  oxyCODONE-acetaminophen (PERCOCET/ROXICET) 5-325 MG per tablet 1 tablet (not administered)     Initial Impression / Assessment and Plan / ED Course  I have reviewed the triage vital signs and the nursing notes.  Pertinent labs & imaging results that were available during my care of the patient were reviewed by me and considered in my medical decision making (see chart for details).  Clinical Course as of Jul 09 2054  Thu Jul 09, 2017  2041 Spoke with Dorian Pod, pharmacist, who states a 1 time dose of decadron should not affect coumadin/INR level.  [JR]  301 77 year old female with radicular right-sided pain from her buttock down to her foot.  It is not associated with any trauma.  She saw her PCP for this and they put her on hydrocodone and a muscle relaxant.  She states she has had no relief with this.  She is got normal strength and sensation.  She is on Coumadin for A. fib and an artificial heart valve.  We are going to try dose of some steroids impressed upon the patient that she  needs to follow-up with her primary care doctor and you may end up requiring some MRI imaging.  She understands and will return if any worsening symptoms  [MB]    Clinical Course User Index [JR] Mikah Rottinghaus, [place] N, PA-C [MB] Hayden Rasmussen, MD    Pt presenting to ED with complaint of sciatica, not improved w medications prescribed by PCP. Normal neurological exam, no evidence of urinary incontinence or retention, pain is consistently reproducible. There is no evidence of AAA or concern for dissection at this time. Patient can walk but states is painful.  No loss of bowel or bladder control.  No concern for cauda equina.  No fever, night sweats, weight loss, h/o cancer, IVDU.  Pain treated here in the department. PO dose of  decadron also administered. Pt discussed with and evaluated by Dr. Melina Copa, who agrees with care plan. Discussed dosing or hydrocodone, and instructed pt is was safe to take 1-2 tabs as needed for added pain relief. Symptomatic management discussed with patient. I have also discussed reasons to return immediately to the ER.  Patient expresses understanding and agrees with plan.  Discussed results, findings, treatment and follow up. Patient advised of return precautions. Patient verbalized understanding and agreed with  plan.  Final Clinical Impressions(s) / ED Diagnoses   Final diagnoses:  Right sided sciatica    ED Discharge Orders    None       Mariena Meares, [place] N, PA-C 07/09/17 2056    Hayden Rasmussen, MD 07/11/17 1242

## 2017-07-09 NOTE — Telephone Encounter (Signed)
U/S pending for 07/21/17.  I forwarded response directly to patient about going to the ER.

## 2017-07-09 NOTE — Telephone Encounter (Signed)
I called patient to confirm that message was received. Patient in agreement to go to ER

## 2017-07-10 ENCOUNTER — Encounter: Payer: Self-pay | Admitting: Internal Medicine

## 2017-07-11 ENCOUNTER — Emergency Department (HOSPITAL_COMMUNITY): Payer: Medicare Other

## 2017-07-11 ENCOUNTER — Inpatient Hospital Stay (HOSPITAL_COMMUNITY)
Admission: EM | Admit: 2017-07-11 | Discharge: 2017-07-22 | DRG: 551 | Disposition: A | Discharge status: Skilled Nursing Facility | Payer: Medicare Other | Attending: Internal Medicine | Admitting: Internal Medicine

## 2017-07-11 ENCOUNTER — Encounter (HOSPITAL_COMMUNITY): Payer: Self-pay | Admitting: Emergency Medicine

## 2017-07-11 DIAGNOSIS — R338 Other retention of urine: Secondary | ICD-10-CM | POA: Diagnosis not present

## 2017-07-11 DIAGNOSIS — M48061 Spinal stenosis, lumbar region without neurogenic claudication: Secondary | ICD-10-CM | POA: Diagnosis present

## 2017-07-11 DIAGNOSIS — M47816 Spondylosis without myelopathy or radiculopathy, lumbar region: Secondary | ICD-10-CM | POA: Diagnosis present

## 2017-07-11 DIAGNOSIS — Z79899 Other long term (current) drug therapy: Secondary | ICD-10-CM

## 2017-07-11 DIAGNOSIS — Z66 Do not resuscitate: Secondary | ICD-10-CM | POA: Diagnosis present

## 2017-07-11 DIAGNOSIS — N390 Urinary tract infection, site not specified: Secondary | ICD-10-CM | POA: Diagnosis present

## 2017-07-11 DIAGNOSIS — F329 Major depressive disorder, single episode, unspecified: Secondary | ICD-10-CM | POA: Diagnosis present

## 2017-07-11 DIAGNOSIS — R52 Pain, unspecified: Secondary | ICD-10-CM | POA: Diagnosis not present

## 2017-07-11 DIAGNOSIS — I10 Essential (primary) hypertension: Secondary | ICD-10-CM | POA: Diagnosis present

## 2017-07-11 DIAGNOSIS — S7011XA Contusion of right thigh, initial encounter: Secondary | ICD-10-CM | POA: Diagnosis present

## 2017-07-11 DIAGNOSIS — B9620 Unspecified Escherichia coli [E. coli] as the cause of diseases classified elsewhere: Secondary | ICD-10-CM | POA: Diagnosis present

## 2017-07-11 DIAGNOSIS — M549 Dorsalgia, unspecified: Secondary | ICD-10-CM

## 2017-07-11 DIAGNOSIS — G4700 Insomnia, unspecified: Secondary | ICD-10-CM | POA: Diagnosis present

## 2017-07-11 DIAGNOSIS — I739 Peripheral vascular disease, unspecified: Secondary | ICD-10-CM | POA: Diagnosis present

## 2017-07-11 DIAGNOSIS — Z8249 Family history of ischemic heart disease and other diseases of the circulatory system: Secondary | ICD-10-CM

## 2017-07-11 DIAGNOSIS — Z8673 Personal history of transient ischemic attack (TIA), and cerebral infarction without residual deficits: Secondary | ICD-10-CM

## 2017-07-11 DIAGNOSIS — K828 Other specified diseases of gallbladder: Secondary | ICD-10-CM | POA: Diagnosis not present

## 2017-07-11 DIAGNOSIS — R791 Abnormal coagulation profile: Secondary | ICD-10-CM | POA: Diagnosis present

## 2017-07-11 DIAGNOSIS — T426X5A Adverse effect of other antiepileptic and sedative-hypnotic drugs, initial encounter: Secondary | ICD-10-CM | POA: Diagnosis not present

## 2017-07-11 DIAGNOSIS — Z952 Presence of prosthetic heart valve: Secondary | ICD-10-CM

## 2017-07-11 DIAGNOSIS — G3184 Mild cognitive impairment, so stated: Secondary | ICD-10-CM | POA: Diagnosis present

## 2017-07-11 DIAGNOSIS — M5431 Sciatica, right side: Principal | ICD-10-CM | POA: Diagnosis present

## 2017-07-11 DIAGNOSIS — R451 Restlessness and agitation: Secondary | ICD-10-CM | POA: Diagnosis present

## 2017-07-11 DIAGNOSIS — M5416 Radiculopathy, lumbar region: Secondary | ICD-10-CM | POA: Diagnosis present

## 2017-07-11 DIAGNOSIS — D72829 Elevated white blood cell count, unspecified: Secondary | ICD-10-CM | POA: Diagnosis not present

## 2017-07-11 DIAGNOSIS — E222 Syndrome of inappropriate secretion of antidiuretic hormone: Secondary | ICD-10-CM | POA: Diagnosis present

## 2017-07-11 DIAGNOSIS — R339 Retention of urine, unspecified: Secondary | ICD-10-CM | POA: Diagnosis present

## 2017-07-11 DIAGNOSIS — E7800 Pure hypercholesterolemia, unspecified: Secondary | ICD-10-CM | POA: Diagnosis present

## 2017-07-11 DIAGNOSIS — I480 Paroxysmal atrial fibrillation: Secondary | ICD-10-CM | POA: Diagnosis present

## 2017-07-11 DIAGNOSIS — Z888 Allergy status to other drugs, medicaments and biological substances status: Secondary | ICD-10-CM

## 2017-07-11 DIAGNOSIS — M79604 Pain in right leg: Secondary | ICD-10-CM | POA: Diagnosis not present

## 2017-07-11 DIAGNOSIS — I700 Atherosclerosis of aorta: Secondary | ICD-10-CM | POA: Diagnosis present

## 2017-07-11 DIAGNOSIS — Z7901 Long term (current) use of anticoagulants: Secondary | ICD-10-CM

## 2017-07-11 DIAGNOSIS — F0150 Vascular dementia without behavioral disturbance: Secondary | ICD-10-CM | POA: Diagnosis present

## 2017-07-11 DIAGNOSIS — M5430 Sciatica, unspecified side: Secondary | ICD-10-CM | POA: Diagnosis present

## 2017-07-11 DIAGNOSIS — J309 Allergic rhinitis, unspecified: Secondary | ICD-10-CM | POA: Diagnosis present

## 2017-07-11 DIAGNOSIS — G9341 Metabolic encephalopathy: Secondary | ICD-10-CM | POA: Diagnosis not present

## 2017-07-11 DIAGNOSIS — D649 Anemia, unspecified: Secondary | ICD-10-CM | POA: Diagnosis present

## 2017-07-11 DIAGNOSIS — M810 Age-related osteoporosis without current pathological fracture: Secondary | ICD-10-CM | POA: Diagnosis present

## 2017-07-11 DIAGNOSIS — D62 Acute posthemorrhagic anemia: Secondary | ICD-10-CM | POA: Diagnosis present

## 2017-07-11 DIAGNOSIS — S70311A Abrasion, right thigh, initial encounter: Secondary | ICD-10-CM | POA: Diagnosis present

## 2017-07-11 LAB — BASIC METABOLIC PANEL
ANION GAP: 12 (ref 5–15)
BUN: 23 mg/dL — ABNORMAL HIGH (ref 6–20)
CALCIUM: 8.7 mg/dL — AB (ref 8.9–10.3)
CO2: 20 mmol/L — ABNORMAL LOW (ref 22–32)
Chloride: 98 mmol/L — ABNORMAL LOW (ref 101–111)
Creatinine, Ser: 0.74 mg/dL (ref 0.44–1.00)
Glucose, Bld: 124 mg/dL — ABNORMAL HIGH (ref 65–99)
Potassium: 4.3 mmol/L (ref 3.5–5.1)
Sodium: 130 mmol/L — ABNORMAL LOW (ref 135–145)

## 2017-07-11 LAB — URINALYSIS, ROUTINE W REFLEX MICROSCOPIC
BACTERIA UA: NONE SEEN
Bilirubin Urine: NEGATIVE
GLUCOSE, UA: NEGATIVE mg/dL
KETONES UR: 20 mg/dL — AB
NITRITE: NEGATIVE
PROTEIN: 30 mg/dL — AB
Specific Gravity, Urine: 1.02 (ref 1.005–1.030)
pH: 5 (ref 5.0–8.0)

## 2017-07-11 LAB — PROTIME-INR

## 2017-07-11 LAB — CBC WITH DIFFERENTIAL/PLATELET
BASOS PCT: 0 %
Basophils Absolute: 0 10*3/uL (ref 0.0–0.1)
Eosinophils Absolute: 0 10*3/uL (ref 0.0–0.7)
Eosinophils Relative: 0 %
HEMATOCRIT: 32.9 % — AB (ref 36.0–46.0)
Hemoglobin: 11 g/dL — ABNORMAL LOW (ref 12.0–15.0)
Lymphocytes Relative: 4 %
Lymphs Abs: 0.7 10*3/uL (ref 0.7–4.0)
MCH: 26.6 pg (ref 26.0–34.0)
MCHC: 33.4 g/dL (ref 30.0–36.0)
MCV: 79.7 fL (ref 78.0–100.0)
MONO ABS: 1.3 10*3/uL — AB (ref 0.1–1.0)
Monocytes Relative: 8 %
NEUTROS ABS: 14.4 10*3/uL — AB (ref 1.7–7.7)
Neutrophils Relative %: 88 %
PLATELETS: 414 10*3/uL — AB (ref 150–400)
RBC: 4.13 MIL/uL (ref 3.87–5.11)
RDW: 14.8 % (ref 11.5–15.5)
WBC: 16.4 10*3/uL — ABNORMAL HIGH (ref 4.0–10.5)

## 2017-07-11 MED ORDER — IOPAMIDOL (ISOVUE-300) INJECTION 61%
INTRAVENOUS | Status: AC
  Administered 2017-07-11: 100 mL
  Filled 2017-07-11: qty 100

## 2017-07-11 MED ORDER — HALOPERIDOL LACTATE 5 MG/ML IJ SOLN
1.0000 mg | Freq: Once | INTRAMUSCULAR | Status: AC
  Administered 2017-07-11: 1 mg via INTRAVENOUS
  Filled 2017-07-11: qty 1

## 2017-07-11 MED ORDER — PHYTONADIONE 5 MG PO TABS
10.0000 mg | ORAL_TABLET | Freq: Once | ORAL | Status: AC
  Administered 2017-07-11: 10 mg via ORAL
  Filled 2017-07-11: qty 2

## 2017-07-11 MED ORDER — ONDANSETRON HCL 4 MG/2ML IJ SOLN
4.0000 mg | Freq: Once | INTRAMUSCULAR | Status: AC
  Administered 2017-07-11: 4 mg via INTRAVENOUS
  Filled 2017-07-11: qty 2

## 2017-07-11 MED ORDER — KETOROLAC TROMETHAMINE 15 MG/ML IJ SOLN: 15.0000 mg | Freq: Once | INTRAMUSCULAR | Status: DC

## 2017-07-11 MED ORDER — SODIUM CHLORIDE 0.9 % IV SOLN
INTRAVENOUS | Status: DC
  Administered 2017-07-12 – 2017-07-15 (×6): via INTRAVENOUS

## 2017-07-11 MED ORDER — FENTANYL CITRATE (PF) 100 MCG/2ML IJ SOLN
50.0000 ug | Freq: Once | INTRAMUSCULAR | Status: AC
  Administered 2017-07-11: 50 ug via INTRAVENOUS
  Filled 2017-07-11: qty 2

## 2017-07-11 MED ORDER — LORAZEPAM 2 MG/ML IJ SOLN
1.0000 mg | Freq: Once | INTRAMUSCULAR | Status: AC
  Administered 2017-07-11: 1 mg via INTRAVENOUS
  Filled 2017-07-11: qty 1

## 2017-07-11 MED ORDER — SODIUM CHLORIDE 0.9 % IV BOLUS (SEPSIS)
500.0000 mL | Freq: Once | INTRAVENOUS | Status: AC
  Administered 2017-07-11: 500 mL via INTRAVENOUS

## 2017-07-11 MED ORDER — HYDROMORPHONE HCL 1 MG/ML IJ SOLN
0.5000 mg | INTRAMUSCULAR | Status: AC
  Administered 2017-07-12: 0.5 mg via INTRAVENOUS
  Filled 2017-07-11: qty 1

## 2017-07-11 MED ORDER — HYDROMORPHONE HCL 1 MG/ML IJ SOLN
0.5000 mg | Freq: Once | INTRAMUSCULAR | Status: AC
  Administered 2017-07-11: 0.5 mg via INTRAVENOUS
  Filled 2017-07-11: qty 1

## 2017-07-11 MED ORDER — HYDROMORPHONE HCL 1 MG/ML IJ SOLN: 0.5000 mg | Freq: Once | INTRAMUSCULAR | Status: DC

## 2017-07-11 MED ORDER — LORAZEPAM 2 MG/ML IJ SOLN: 0.5000 mg | Freq: Once | INTRAMUSCULAR | Status: DC

## 2017-07-11 MED ORDER — SODIUM CHLORIDE 0.9 % IV SOLN
INTRAVENOUS | Status: DC
Start: 2017-07-11 — End: 2017-07-11
  Administered 2017-07-11: 18:00:00 via INTRAVENOUS

## 2017-07-11 NOTE — ED Triage Notes (Signed)
Patient here via EMS with complaints of right hip pain radiating down right leg. Takes Vicodin for pain. Pain 10/10. Hx of sciatica.

## 2017-07-11 NOTE — ED Notes (Signed)
I have assisted patient to the bedside commode so she could use the bathroom all she has done is complain about her leg hurting and saying she can not use the bedside commode because of the leg pain, patient was standing up and down when on the bedside commode, patient also stated since she can not use the bedside commode she will just pee in the bed because her hip is now hurting. Patient almost fell when being assisted to the bedside commode because she is still drowsy from the pain meds and was not listening too staff who were advising her she needed not to be getting up out the bed.

## 2017-07-11 NOTE — ED Notes (Signed)
Safety measures in place, sr xz 2 up, cont POX and ETCO2 initiated, RN remaining at bedside, pt very sedated

## 2017-07-11 NOTE — ED Notes (Addendum)
Patient continuously stating she has too use the bathroom but will not go with any of the interventions we have offered her and keeps complaining about her leg hurting

## 2017-07-11 NOTE — ED Provider Notes (Signed)
[facility] DEPT Provider Note   CSN: [id number] Arrival date & time: 07/11/17  1103     History   Chief Complaint Chief Complaint  Patient presents with  . Leg Pain  . Hip Pain    HPI Jodi Frank is a 77 y.o. female.  She presents for evaluation of right leg pain, with bleeding from a right leg wound.  Right leg pain started 4 days ago without trauma.  She saw her PCP 3 days ago and was prescribed hydrocodone and Zanaflex, for treatment of sciatica.  She came to the emergency department the following day and was given an injection of Decadron to treat the discomfort.  She has had absolutely no relief of her pain.  She states that she cannot find a comfortable position.  She came here ambulatory, by private vehicle, with a friend for help with her discomfort.  No prior similar problem.  She denies fever, chills, bowel or urine incontinence.  There are no other known modifying factors.      HPI  Past Medical History:  Diagnosis Date  . Broken hip ([place]) 10/2011   left  . Broken wrist   . Depression   . Diarrhea   . H/O long-term (current) use of anticoagulants   . High cholesterol   . Hypertension   . Memory difficulty 01/18/2016  . 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 Osteoporosis   . SIADH (syndrome of inappropriate ADH production) ([place])   . Vertigo 01/18/2016    Patient Active Problem List   Diagnosis Date Noted  . Anemia 05/15/2017  . Paroxysmal atrial fibrillation ([place]) 05/15/2017  . Abdominal aortic atherosclerosis ([place]) 05/06/2017  . 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 Mild cognitive impairment 05/06/2017  . H/O mitral valve replacement with mechanical valve 05/06/2017  . Stage 1 skin ulcer of sacral region ([place]) 05/05/2017  . Scalp laceration 05/05/2017  . Acute encephalopathy 05/05/2017  . Fall 05/04/2017  . Intolerance of drug 07/03/2016  . Memory difficulty 01/18/2016  . Vertigo  01/18/2016  . Mixed hyperlipidemia 07/12/2014  . Thiazide diuretic adverse reaction 01/03/2014  . Osteoporosis 01/14/2013  . External hemorrhoids 12/24/2012  . Hip fracture, left S/P cannulated hip pinning 11/10/2012  . Insomnia 11/10/2012  . Muscle spasm 11/10/2012  . Essential hypertension, benign 11/10/2012  . Constipation 11/10/2012  . Distal radius fracture, left 11/10/2012  . Long term (current) use of anticoagulants 11/10/2012  . Femoral neck fracture ([place]) 11/10/2012  . Acute blood loss anemia 11/05/2012  . Overactive bladder 09/05/2011  . Rosacea 09/04/2011  . 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 DIARRHEA 01/30/2010  . DEPRESSION 12/08/2006  . ALLERGIC RHINITIS 12/08/2006    Past Surgical History:  Procedure Laterality Date  . HEMATOMA EVACUATION Left 11/25/2012   Procedure: EVACUATION HEMATOMA;  Surgeon: Linna Hoff, MD;  Location: [facility];  Service: Orthopedics;  Laterality: Left;  . HIP PINNING,CANNULATED Left 11/03/2012   Procedure: CANNULATED HIP PINNING;  Surgeon: Tobi Bastos, MD;  Location: [facility];  Service: Orthopedics;  Laterality: Left;  . 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  . OPEN REDUCTION INTERNAL FIXATION (ORIF) WRIST WITH ILIAC CREST BONE GRAFT Left 11/24/2012   Procedure: OPEN REDUCTION INTERNAL FIXATION LEFT WRIST POSSIBLE BONE GRAFTING AND PINNING (ORIF) ;  Surgeon: Linna Hoff, MD;  Location: [facility];  Service: Orthopedics;  Laterality: Left;  . SHOULDER OPEN ROTATOR CUFF REPAIR  OB History    No data available       Home Medications    Prior to Admission medications   Medication Sig Start Date End Date Taking? Authorizing Provider  acetaminophen (TYLENOL) 500 MG tablet Take 500-1,000 mg by mouth every 6 (six) hours as needed.   Yes [provider]  alendronate (FOSAMAX) 70 MG tablet Take 1 tablet (70 mg total) by mouth every Sunday. Take with a full glass of water on an empty stomach. 05/10/17  Yes Alphonzo Grieve, MD  atorvastatin (LIPITOR) 10 MG tablet Take 1 tablet (10 mg total) by mouth at bedtime. 05/09/17  Yes Alphonzo Grieve, MD  Cholecalciferol (VITAMIN D3) 1000 units CAPS Take 1 capsule (1,000 Units total) by mouth daily. 05/09/17  Yes Alphonzo Grieve, MD  HYDROcodone-acetaminophen (NORCO) 5-325 MG tablet Take 1 tablet by mouth every 6 (six) hours as needed for moderate pain or severe pain. May cut tablet in half 07/08/17  Yes Eulas Post, Monica, DO  losartan (COZAAR) 100 MG tablet Take 1 tablet (100 mg total) by mouth daily. 05/09/17 09/18/18 Yes Alphonzo Grieve, MD  meclizine (ANTIVERT) 25 MG tablet Take 1 tablet (25 mg total) by mouth daily as needed for dizziness. 06/10/17  Yes Gildardo Cranker, DO  Menthol, Topical Analgesic, (ICY HOT EX) Apply 1 application topically 3 (three) times daily as needed (for pain).   Yes [provider]  metoprolol succinate (TOPROL-XL) 50 MG 24 hr tablet Take 1 tablet (50 mg total) by mouth daily. 05/09/17  Yes Alphonzo Grieve, MD  Omega-3 Fatty Acids (OMEGA-3 FISH OIL) 1200 MG CAPS Take 2 capsules (2,400 mg total) by mouth 2 (two) times daily. 05/09/17  Yes Alphonzo Grieve, MD  tiZANidine (ZANAFLEX) 4 MG tablet Take 1 tablet (4 mg total) by mouth 3 (three) times daily as needed for muscle spasms. May cut tablet in half. 07/08/17  Yes Gildardo Cranker, DO  traZODone (DESYREL) 50 MG tablet Take 25-50 mg by mouth at bedtime as needed for sleep (1/2 to 1 tablet PRN).    Yes [provider]  warfarin (COUMADIN) 5 MG tablet Take 1 tablet (5 mg total) by mouth daily. 06/10/17  Yes Gildardo Cranker, DO    Family History Family History  Problem Relation Age of Onset  . Stroke Mother   . Hypertension Mother   . Stroke Father   . Colon cancer Paternal Grandmother   . Parkinsonism Brother     Social History Social History   Tobacco Use  . Smoking status: Never Smoker  . Smokeless tobacco: Never Used  Substance Use Topics  . Alcohol use: No     Alcohol/week: 2.4 oz    Types: 4 Glasses of wine per week    Frequency: Never  . Drug use: No     Allergies   Carvedilol; Ace inhibitors; Amlodipine; and Hydrochlorothiazide   Review of Systems Review of Systems  All other systems reviewed and are negative.    Physical Exam Updated Vital Signs BP (!) 194/89 (BP Location: Right Arm)   Pulse (!) 102   Temp 97.8 F (36.6 C) (Oral)   Resp 15   SpO2 98%   Physical Exam  Constitutional: She is oriented to person, place, and time. She appears well-developed. She appears distressed (Uncomfortable, restless, moving around in the stretcher sitting, standing, repeatedly asking the same questions.).  Elderly, frail  HENT:  Head: Normocephalic and atraumatic.  Eyes: Conjunctivae and EOM are normal. Pupils are equal, round, and reactive to light.  Neck: Normal range of motion and phonation normal. Neck supple.  Cardiovascular: Normal rate.  Pulmonary/Chest: Effort normal. No respiratory distress.  Musculoskeletal: Normal range of motion. She exhibits no edema or deformity.  No tenderness of the lumbar or thoracic spines.  Neurological: She is alert and oriented to person, place, and time. No cranial nerve deficit. She exhibits normal muscle tone. Coordination normal.  No dysarthria or aphasia  Skin: Skin is warm and dry.  Superficial bleeding wound of the right anterior thigh, which appears to be an abrasion.  Scattered ecchymotic areas of the right medial, which is do not appear to be contusion or fall related.  Psychiatric:  Agitated and anxious.  Repeatedly asks the same question despite acknowledging the answers each time.  Nursing note and vitals reviewed.    ED Treatments / Results  Labs (all labs ordered are listed, but only abnormal results are displayed) Labs Reviewed  URINALYSIS, ROUTINE W REFLEX MICROSCOPIC  CBC WITH DIFFERENTIAL/PLATELET  BASIC METABOLIC PANEL  PROTIME-INR    EKG  EKG Interpretation None         Radiology No results found.  Procedures Procedures (including critical care time)  Medications Ordered in ED Medications  sodium chloride 0.9 % bolus 500 mL (not administered)  0.9 %  sodium chloride infusion (not administered)  fentaNYL (SUBLIMAZE) injection 50 mcg (50 mcg Intravenous Given 07/11/17 1637)  ondansetron (ZOFRAN) injection 4 mg (4 mg Intravenous Given 07/11/17 1643)  LORazepam (ATIVAN) injection 1 mg (1 mg Intravenous Given 07/11/17 1640)  haloperidol lactate (HALDOL) injection 1 mg (1 mg Intravenous Given 07/11/17 1639)     Initial Impression / Assessment and Plan / ED Course  I have reviewed the triage vital signs and the nursing notes.  Pertinent labs & imaging results that were available during my care of the patient were reviewed by me and considered in my medical decision making (see chart for details).      Patient Vitals for the past 24 hrs:  BP Temp Temp src Pulse Resp SpO2  07/11/17 1653 (!) 194/89 - - (!) 102 15 98 %  07/11/17 1651 (!) 179/84 - - (!) 108 14 (!) 89 %  07/11/17 1117 (!) 125/105 97.8 F (36.6 C) Oral 73 18 95 %    4:56 PM Reevaluation with update and discussion. After initial assessment and treatment, an updated evaluation reveals somewhat more comfortable but still too agitated to undergo MRI imaging.  Will allow medications to work a bit then reassess for possible additional medication or alternate approach to evaluation.  At this time care transfer to oncoming provider team. Daleen Bo      Final Clinical Impressions(s) / ED Diagnoses   Final diagnoses:  Sciatica of right side    Right leg pain, without trauma, and anticoagulated status.  Concern for lumbar myelopathy causing the discomfort and lack of improvement with usual care approach.  Patient is elderly, and may have underlying dementia and anxiety contributing to difficulty of treatment.  Labs ordered to assess for Coumadin level, signs of infection, and preparation  for possible admission.  MRI imaging ordered to assess for potential lumbar myelopathy syndrome.  Nursing Notes Reviewed/ Care Coordinated Applicable Imaging Reviewed Interpretation of Laboratory Data incorporated into ED treatment  Plan: [place]  ED Discharge Orders    None       Daleen Bo, MD 07/11/17 1700

## 2017-07-11 NOTE — ED Notes (Signed)
Pt to MRI with RN, sr x 2 up on stretcher, with oxygen at 4lpm per [place]

## 2017-07-11 NOTE — ED Notes (Signed)
Patient transported to CT 

## 2017-07-11 NOTE — H&P (Signed)
History and Physical    Jodi Frank LKG:[id number] DOB: 01/05/1941 DOA: 07/11/2017  Referring MD/NP/PA: Dr. Virgel Manifold PCP: Gildardo Cranker, DO   Patient coming from:  home  Chief Complaint: Right leg pain  I have personally briefly reviewed patient's old medical records in [place]   HPI: Jodi Frank is a 77 y.o. female with medical history significant of  A. 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  Patient notes symptoms were initially mild, but over the course of the week became severe.  Describes it as a sharp pain radiating down her right leg.  Denies any recent falls, heavy lifting, or traumas.  Seen by her PCP 4 days ago, and started on Norco and Zanaflex.  She was seen in the emergency department the following day reporting no relief of symptoms.  Patient was given 8 mg of Decadron and discharged home.  At baseline patient lives alone, and has not been able to care for herself adequately due to pain symptoms and difficulty ambulating.  Despite even doubling up doses of hydrocodone she reported no relief in pain symptoms.   ED Course: Upon admission into the emergency department patient was seen to be afebrile, heart rates 73-121, blood pressure 125/105-209/97, O2 saturations 89-99% on room air.  Labs revealed WBC 16.6, hemoglobin 11, platelets 414, sodium 130, BUN 23, creatinine 0.74, and  INR >10.  Urinalysis was negative for signs of infection.  Incidentally on MRI of the lumbar spine patient was noted to be retaining urine and multi level degenerative changes of the lumbar spine with possible irritation seen of the right L5 nerve root.  Foley catheter was placed with return of over 1 L of urine.  Patient was given 10 mg of vitamin K.  Despite multiple doses of pain medication patient still  complaining of severe pain.  TRH called to admit  Review of Systems  Constitutional: Negative for chills, fever and weight loss.  HENT: Negative for congestion and nosebleeds.   Eyes: Negative for double vision and photophobia.  Respiratory: Negative for cough and sputum production.   Cardiovascular: Negative for chest pain and leg swelling.  Gastrointestinal: Negative for abdominal pain, nausea and vomiting.  Genitourinary: Positive for urgency.  Musculoskeletal: Positive for myalgias.  Neurological: Positive for sensory change.  Endo/Heme/Allergies: Bruises/bleeds easily.  Psychiatric/Behavioral: Negative for substance abuse.  All other systems reviewed and are negative.   Past Medical History:  Diagnosis Date  . Broken hip ([place]) 10/2011   left  . Broken wrist   . Depression   . Diarrhea   . H/O long-term (current) use of anticoagulants   . High cholesterol   . Hypertension   . Memory difficulty 01/18/2016  . 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 Osteoporosis   . SIADH (syndrome of inappropriate ADH production) ([place])   . Vertigo 01/18/2016    Past Surgical History:  Procedure Laterality Date  . HEMATOMA EVACUATION Left 11/25/2012   Procedure: EVACUATION HEMATOMA;  Surgeon: Linna Hoff, MD;  Location: [facility];  Service: Orthopedics;  Laterality: Left;  . HIP PINNING,CANNULATED Left 11/03/2012   Procedure: CANNULATED HIP PINNING;  Surgeon: Tobi Bastos, MD;  Location: [facility];  Service: Orthopedics;  Laterality: Left;  . 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  . OPEN REDUCTION INTERNAL FIXATION (  ORIF) WRIST WITH ILIAC CREST BONE GRAFT Left 11/24/2012   Procedure: OPEN REDUCTION INTERNAL FIXATION LEFT WRIST POSSIBLE BONE GRAFTING AND PINNING (ORIF) ;  Surgeon: Linna Hoff, MD;  Location: [facility];  Service: Orthopedics;  Laterality: Left;  . SHOULDER OPEN ROTATOR CUFF REPAIR       reports that  has never smoked. she has never used smokeless tobacco. She  reports that she does not drink alcohol or use drugs.  Allergies  Allergen Reactions  . Carvedilol Itching  . Ace Inhibitors Other (See Comments)    Reaction (??)  . Amlodipine Swelling  . Hydrochlorothiazide Other (See Comments)    Hyponatremia     Family History  Problem Relation Age of Onset  . Stroke Mother   . Hypertension Mother   . Stroke Father   . Colon cancer Paternal Grandmother   . Parkinsonism Brother     Prior to Admission medications   Medication Sig Start Date End Date Taking? Authorizing Provider  acetaminophen (TYLENOL) 500 MG tablet Take 500-1,000 mg by mouth every 6 (six) hours as needed.   Yes [provider]  alendronate (FOSAMAX) 70 MG tablet Take 1 tablet (70 mg total) by mouth every Sunday. Take with a full glass of water on an empty stomach. 05/10/17  Yes Alphonzo Grieve, MD  atorvastatin (LIPITOR) 10 MG tablet Take 1 tablet (10 mg total) by mouth at bedtime. 05/09/17  Yes Alphonzo Grieve, MD  Cholecalciferol (VITAMIN D3) 1000 units CAPS Take 1 capsule (1,000 Units total) by mouth daily. 05/09/17  Yes Alphonzo Grieve, MD  HYDROcodone-acetaminophen (NORCO) 5-325 MG tablet Take 1 tablet by mouth every 6 (six) hours as needed for moderate pain or severe pain. May cut tablet in half 07/08/17  Yes Eulas Post, Monica, DO  losartan (COZAAR) 100 MG tablet Take 1 tablet (100 mg total) by mouth daily. 05/09/17 09/18/18 Yes Alphonzo Grieve, MD  meclizine (ANTIVERT) 25 MG tablet Take 1 tablet (25 mg total) by mouth daily as needed for dizziness. 06/10/17  Yes Gildardo Cranker, DO  Menthol, Topical Analgesic, (ICY HOT EX) Apply 1 application topically 3 (three) times daily as needed (for pain).   Yes [provider]  metoprolol succinate (TOPROL-XL) 50 MG 24 hr tablet Take 1 tablet (50 mg total) by mouth daily. 05/09/17  Yes Alphonzo Grieve, MD  Omega-3 Fatty Acids (OMEGA-3 FISH OIL) 1200 MG CAPS Take 2 capsules (2,400 mg total) by mouth 2 (two) times daily.  05/09/17  Yes Alphonzo Grieve, MD  tiZANidine (ZANAFLEX) 4 MG tablet Take 1 tablet (4 mg total) by mouth 3 (three) times daily as needed for muscle spasms. May cut tablet in half. 07/08/17  Yes Gildardo Cranker, DO  traZODone (DESYREL) 50 MG tablet Take 25-50 mg by mouth at bedtime as needed for sleep (1/2 to 1 tablet PRN).    Yes [provider]  warfarin (COUMADIN) 5 MG tablet Take 1 tablet (5 mg total) by mouth daily. 06/10/17  Yes Gildardo Cranker, DO    Physical Exam:  Constitutional: Female who was unable to get comfortable hospital gurney Vitals:   07/11/17 1651 07/11/17 1653 07/11/17 1734 07/11/17 2202  BP: (!) 179/84 (!) 194/89 (!) 209/97 136/70  Pulse: (!) 108 (!) 102 (!) 121 (!) 115  Resp: 14 15  18   Temp:      TempSrc:      SpO2: (!) 89% 98% 99% 91%   Eyes: PERRL, lids and conjunctivae normal ENMT: Mucous membranes are moist. Posterior pharynx clear of  any exudate or lesions.  Neck: normal, supple, no masses, no thyromegaly Respiratory: clear to auscultation bilaterally, no wheezing, no crackles. Normal respiratory effort. No accessory muscle use.  Cardiovascular: Tachycardic with mechanical click appreciated.  No significant leg swelling Abdomen: no tenderness, no masses palpated. No hepatosplenomegaly. Bowel sounds positive.  Musculoskeletal: no clubbing / cyanosis. Skin: Bruising present upper and lower extremities  Neurologic: CN 2-12 grossly intact. Sensation intact, DTR normal. Strength 5/5 in all 4.  Psychiatric: Normal judgment and insight. Alert and oriented x 3.  Anxious mood.     Labs on Admission: I have personally reviewed following labs and imaging studies  CBC: Recent Labs  Lab 07/11/17 1745  WBC 16.4*  NEUTROABS 14.4*  HGB 11.0*  HCT 32.9*  MCV 79.7  PLT 734*   Basic Metabolic Panel: Recent Labs  Lab 07/11/17 1745  NA 130*  K 4.3  CL 98*  CO2 20*  GLUCOSE 124*  BUN 23*  CREATININE 0.74  CALCIUM 8.7*   GFR: Estimated Creatinine  Clearance: 58.5 mL/min (by C-G formula based on SCr of 0.74 mg/dL). Liver Function Tests: No results for input(s): AST, ALT, ALKPHOS, BILITOT, PROT, ALBUMIN in the last 168 hours. No results for input(s): LIPASE, AMYLASE in the last 168 hours. No results for input(s): AMMONIA in the last 168 hours. Coagulation Profile: Recent Labs  Lab 07/11/17 1745  INR >10.00*   Cardiac Enzymes: No results for input(s): CKTOTAL, CKMB, CKMBINDEX, TROPONINI in the last 168 hours. BNP (last 3 results) No results for input(s): PROBNP in the last 8760 hours. HbA1C: No results for input(s): HGBA1C in the last 72 hours. CBG: No results for input(s): GLUCAP in the last 168 hours. Lipid Profile: No results for input(s): CHOL, HDL, LDLCALC, TRIG, CHOLHDL, LDLDIRECT in the last 72 hours. Thyroid Function Tests: No results for input(s): TSH, T4TOTAL, FREET4, T3FREE, THYROIDAB in the last 72 hours. Anemia Panel: No results for input(s): VITAMINB12, FOLATE, FERRITIN, TIBC, IRON, RETICCTPCT in the last 72 hours. Urine analysis:    Component Value Date/Time   COLORURINE YELLOW 07/11/2017 [place] 07/11/2017 1745   LABSPEC 1.020 07/11/2017 1745   PHURINE 5.0 07/11/2017 1745   GLUCOSEU NEGATIVE 07/11/2017 1745   HGBUR MODERATE (A) 07/11/2017 1745   BILIRUBINUR NEGATIVE 07/11/2017 1745   KETONESUR 20 (A) 07/11/2017 1745   PROTEINUR 30 (A) 07/11/2017 1745   UROBILINOGEN 1.0 11/02/2012 1951   NITRITE NEGATIVE 07/11/2017 1745   LEUKOCYTESUR TRACE (A) 07/11/2017 1745   Sepsis Labs: No results found for this or any previous visit (from the past 240 hour(s)).   Radiological Exams on Admission: Mr Lumbar Spine Wo Contrast  Result Date: 07/11/2017 CLINICAL DATA:  Back pain. EXAM: MRI LUMBAR SPINE WITHOUT CONTRAST TECHNIQUE: Multiplanar, multisequence MR imaging of the lumbar spine was performed. No intravenous contrast was administered. COMPARISON:  CT abdomen pelvis dated May 06, 2017.  FINDINGS: Segmentation:  Standard. Alignment:  Trace anterolisthesis at L4-L5. Vertebrae: No fracture, evidence of discitis, or bone lesion. Degenerative type 2 Modic endplate changes at L9-F7 and L3-L4. Type 1 Modic changes at L1-L2. Conus medullaris and cauda equina: Conus extends to the L1-L2 level. Conus and cauda equina appear normal. Paraspinal and other soft tissues: Negative.  Distended bladder. Disc levels: T10-T11: Only seen on the sagittal images.  Negative. T11-T12: Only seen on the sagittal images.  Negative. T12-L1:  Tiny disc bulge.  No stenosis. L1-L2: Small diffuse disc bulge and mild bilateral facet arthropathy. No stenosis. L2-L3: Small diffuse  disc bulge and mild bilateral facet arthropathy. No stenosis. L3-L4: Small diffuse disc bulge and mild bilateral facet arthropathy. No stenosis. L4-L5: Diffuse disc bulge and moderate bilateral facet arthropathy. Prominent posterior epidural fat. Mild central spinal canal stenosis. Mild right lateral recess narrowing with possible irritation of the descending right L5 nerve root by the disc bulge. No neuroforaminal stenosis. L5-S1: Small diffuse disc bulge and mild bilateral facet arthropathy. No stenosis. IMPRESSION: 1. Mild multilevel degenerative changes throughout the lumbar spine as described above, worst at L4-L5 where there is mild central spinal canal stenosis and possible irritation of the descending right L5 nerve root. Electronically Signed   By: Titus Dubin M.D.   On: 07/11/2017 17:30   Ct Abdomen Pelvis W Contrast  Result Date: 07/11/2017 CLINICAL DATA:  Severe back pain with sciatica.  Difficulty voiding. EXAM: CT ABDOMEN AND PELVIS WITH CONTRAST TECHNIQUE: Multidetector CT imaging of the abdomen and pelvis was performed using the standard protocol following bolus administration of intravenous contrast. CONTRAST:  162mL ISOVUE-300 IOPAMIDOL (ISOVUE-300) INJECTION 61% COMPARISON:  None. 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 Cardiomegaly without significant pericardial effusion or thickening. Aortic atherosclerosis of the included thoracic aorta. Mitral annular calcifications are present. Dependent atelectasis is seen at each lung base. Subsegmental atelectasis and/or scarring is also noted. Small hiatal hernia with minimal retained fluid in the distal esophagus. Hepatobiliary: Fatty infiltration about the falciform. Distended gallbladder without stones. No wall thickening. No enhancing mass lesions of the liver. No biliary dilatation is identified. Pancreas: Normal Spleen: Normal Adrenals/Urinary Tract: Bilateral small hypodensities within both kidneys compatible with cysts but too small to further characterize largest is posterior off the left kidney measuring 7 mm. No nephrolithiasis nor obstructive uropathy. Normal bilateral adrenal glands. Distended urinary bladder measuring 10.8 x 20 x 14 cm (volume = 1600 cm^3) without focal mural thickening or calculus. Stomach/Bowel: Stomach is within normal limits. Appendix appears normal. No evidence of bowel wall thickening, distention, or inflammatory changes. Vascular/Lymphatic: Aortic atherosclerosis. No enlarged abdominal or pelvic lymph nodes. Reproductive: Uterus and bilateral adnexa are unremarkable. Other: No abdominal wall hernia or abnormality. No abdominopelvic ascites. Musculoskeletal: Moderate to marked disc space narrowing T12 through L4 with severely affecting L3-4. Minimal grade 1 anterolisthesis of L4 on L5 likely on the basis of degenerative disc and facet arthropathy. Broad-based disc bulge L4-5 and L5-S1. No acute osseous abnormality. IMPRESSION: 1. Marked distention of the urinary bladder with volume estimated at 1.6 L question neurogenic bladder. 2. 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 3. 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 Disc  bulges at L4-5 and L5-S1. Electronically Signed   By: Ashley Royalty M.D.   On: 07/11/2017 21:49     Assessment/Plan Right-sided sciatica: Acute.  Patient presents with right-sided leg pain over the last 1 week.  MRI showing signs suggestive of irritation of L5 nerve root that appears consistent with patient's complaints.  Patient was given 8 mg of Decadron orally 3-4 days ago.  Reports no relief with up to 10 mg of hydrocodone.   - Admit to a telemetry bed - Add-on acetaminophen level - Continuous pulse oximetry overnight, with nasal cannula oxygen as needed - Trial of prednisone 60 mg daily x 5 days - Oxycodone 5 mg q 6hr prn pain - physical therapy to eval and treat - Care management consult for possible need of home health may also need social work consult - If  symptoms persist or worsen may warrant neurosurgery evaluation  Supratherapeutic INR: On admission patient's INR noted to be greater than 10, but no acute bleed noted at this time.  Patient was given 10 mg of oral vitamin K.  Suspect recent medications as likely cause. - recheck PT/INR in a.m. -Type and screen for possible need of FFP if seen to be acutely bleeding  Acute urinary retention: Patient noted to complaints of feeling the urge to urinate.  Found to be be retaining 1.6 L of urine and bladder on admission.  Question medication of tizanidine more so than related to lumbar disc disease seen on CT.  - Continue Foley catheter - Held tizanidine - Bladder training   Anemia: hemoglobin 11 on admission, but baseline hemoglobin appears to be around 8 -9 back in December 2018. - Recheck CBC in a.m.  Leukocytosis: Acute.  WBC elevated at 16.4 on admission.  Urinalysis was negative for any signs of infection.  Could be reactive in nature given recent urinary retention and/or steroids given.  Paroxysmal atrial fibrillation - Hold Coumadin  Essential hypertension - Continue losartan and metoprolol  SIADH: Chronic.  Initially  130 - Continue to monitor    DVT prophylaxis: INR therapeutic at this time holding Coumadin Code Status: DNR Family Communication: Discussed plan of care with the patient and ex-husband who is present at bedside Disposition Plan: To be determined Consults called: none Admission status: observation  Norval Morton MD [facility] Pager [phone]   If 7PM-7AM, please contact night-coverage [email or web address] Password [email or web address]  07/11/2017, 11:14 PM

## 2017-07-11 NOTE — ED Notes (Signed)
Attempted to put patient on bedpan to void and obtain urine sample patient stated she could not go cause she needed to sit up but her leg is bothering her and she can not sit still, attempted to put patient on purewick to void, patient stated she could not go because her leg was still bothering her. I asked patient is she could walk to bathroom, she stated with help but when I assisted patient to stand she almost fell so I helped her back onto bed. Patient stated she would need to be carried and lifted into the bathroom, I stated to her that is not a safe option for neither her nor myself, patient then asked if family could assist with helping her too the bathroom. I informed her and them that was not a safe option either. Patient was just rushed back from MRI with staff stating she needs a bedpan super badly, when I went too assist her again she could not go and insisted again on walking to the bathroom, Nurse let her know she could not get up due to the narcotic pain med she was given and patient started back complaining about her leg bothering her so much.

## 2017-07-11 NOTE — ED Provider Notes (Signed)
77 year old female with right lower extremity pain.  MRI noted.  Not sure this explains her degree of pain.  I question whether she may potentially have a psoas hematoma?  Her INR is greater than 10. Will CT. Vitamin K. Her leg itself does have some scattered ecchymosis posteriolry but no obvious large hematoma in the thigh.  Compartments are soft.  She is neurovascularly intact.   CT ok aside from massive urinary retention. Foley placed. Draining well but didn't seem to change her symptoms much.  She continues to say she feels no better even after meds. Will discuss with medicine for admit for symptoms control, urinary retention, supratherapeutic INR.    Virgel Manifold, MD 07/11/17 [phone]

## 2017-07-12 ENCOUNTER — Other Ambulatory Visit: Payer: Self-pay

## 2017-07-12 DIAGNOSIS — E569 Vitamin deficiency, unspecified: Secondary | ICD-10-CM | POA: Diagnosis not present

## 2017-07-12 DIAGNOSIS — R278 Other lack of coordination: Secondary | ICD-10-CM | POA: Diagnosis not present

## 2017-07-12 DIAGNOSIS — S70311A Abrasion, right thigh, initial encounter: Secondary | ICD-10-CM | POA: Diagnosis present

## 2017-07-12 DIAGNOSIS — M549 Dorsalgia, unspecified: Secondary | ICD-10-CM | POA: Diagnosis not present

## 2017-07-12 DIAGNOSIS — M79604 Pain in right leg: Secondary | ICD-10-CM | POA: Diagnosis not present

## 2017-07-12 DIAGNOSIS — R338 Other retention of urine: Secondary | ICD-10-CM | POA: Diagnosis not present

## 2017-07-12 DIAGNOSIS — B9620 Unspecified Escherichia coli [E. coli] as the cause of diseases classified elsewhere: Secondary | ICD-10-CM | POA: Diagnosis present

## 2017-07-12 DIAGNOSIS — I4891 Unspecified atrial fibrillation: Secondary | ICD-10-CM | POA: Diagnosis not present

## 2017-07-12 DIAGNOSIS — R791 Abnormal coagulation profile: Secondary | ICD-10-CM | POA: Diagnosis present

## 2017-07-12 DIAGNOSIS — M5431 Sciatica, right side: Secondary | ICD-10-CM | POA: Diagnosis present

## 2017-07-12 DIAGNOSIS — Z952 Presence of prosthetic heart valve: Secondary | ICD-10-CM | POA: Diagnosis not present

## 2017-07-12 DIAGNOSIS — I700 Atherosclerosis of aorta: Secondary | ICD-10-CM | POA: Diagnosis present

## 2017-07-12 DIAGNOSIS — D62 Acute posthemorrhagic anemia: Secondary | ICD-10-CM | POA: Diagnosis not present

## 2017-07-12 DIAGNOSIS — D6832 Hemorrhagic disorder due to extrinsic circulating anticoagulants: Secondary | ICD-10-CM | POA: Diagnosis not present

## 2017-07-12 DIAGNOSIS — M6281 Muscle weakness (generalized): Secondary | ICD-10-CM | POA: Diagnosis not present

## 2017-07-12 DIAGNOSIS — R413 Other amnesia: Secondary | ICD-10-CM | POA: Diagnosis not present

## 2017-07-12 DIAGNOSIS — Z66 Do not resuscitate: Secondary | ICD-10-CM | POA: Diagnosis present

## 2017-07-12 DIAGNOSIS — G8911 Acute pain due to trauma: Secondary | ICD-10-CM | POA: Diagnosis not present

## 2017-07-12 DIAGNOSIS — G9341 Metabolic encephalopathy: Secondary | ICD-10-CM | POA: Diagnosis not present

## 2017-07-12 DIAGNOSIS — I739 Peripheral vascular disease, unspecified: Secondary | ICD-10-CM | POA: Diagnosis present

## 2017-07-12 DIAGNOSIS — M48061 Spinal stenosis, lumbar region without neurogenic claudication: Secondary | ICD-10-CM | POA: Diagnosis present

## 2017-07-12 DIAGNOSIS — G9340 Encephalopathy, unspecified: Secondary | ICD-10-CM | POA: Diagnosis not present

## 2017-07-12 DIAGNOSIS — N139 Obstructive and reflux uropathy, unspecified: Secondary | ICD-10-CM | POA: Diagnosis not present

## 2017-07-12 DIAGNOSIS — M47816 Spondylosis without myelopathy or radiculopathy, lumbar region: Secondary | ICD-10-CM | POA: Diagnosis present

## 2017-07-12 DIAGNOSIS — N390 Urinary tract infection, site not specified: Secondary | ICD-10-CM | POA: Diagnosis present

## 2017-07-12 DIAGNOSIS — I480 Paroxysmal atrial fibrillation: Secondary | ICD-10-CM | POA: Diagnosis not present

## 2017-07-12 DIAGNOSIS — M4800 Spinal stenosis, site unspecified: Secondary | ICD-10-CM | POA: Diagnosis not present

## 2017-07-12 DIAGNOSIS — D72829 Elevated white blood cell count, unspecified: Secondary | ICD-10-CM | POA: Diagnosis present

## 2017-07-12 DIAGNOSIS — R52 Pain, unspecified: Secondary | ICD-10-CM | POA: Diagnosis not present

## 2017-07-12 DIAGNOSIS — M5416 Radiculopathy, lumbar region: Secondary | ICD-10-CM | POA: Diagnosis present

## 2017-07-12 DIAGNOSIS — S7011XA Contusion of right thigh, initial encounter: Secondary | ICD-10-CM | POA: Diagnosis present

## 2017-07-12 DIAGNOSIS — E7800 Pure hypercholesterolemia, unspecified: Secondary | ICD-10-CM | POA: Diagnosis present

## 2017-07-12 DIAGNOSIS — R451 Restlessness and agitation: Secondary | ICD-10-CM | POA: Diagnosis present

## 2017-07-12 DIAGNOSIS — E222 Syndrome of inappropriate secretion of antidiuretic hormone: Secondary | ICD-10-CM | POA: Diagnosis present

## 2017-07-12 DIAGNOSIS — R4020 Unspecified coma: Secondary | ICD-10-CM | POA: Diagnosis not present

## 2017-07-12 DIAGNOSIS — F0150 Vascular dementia without behavioral disturbance: Secondary | ICD-10-CM | POA: Diagnosis present

## 2017-07-12 DIAGNOSIS — F329 Major depressive disorder, single episode, unspecified: Secondary | ICD-10-CM | POA: Diagnosis present

## 2017-07-12 DIAGNOSIS — R41841 Cognitive communication deficit: Secondary | ICD-10-CM | POA: Diagnosis not present

## 2017-07-12 DIAGNOSIS — M810 Age-related osteoporosis without current pathological fracture: Secondary | ICD-10-CM | POA: Diagnosis present

## 2017-07-12 DIAGNOSIS — I10 Essential (primary) hypertension: Secondary | ICD-10-CM | POA: Diagnosis present

## 2017-07-12 DIAGNOSIS — R339 Retention of urine, unspecified: Secondary | ICD-10-CM | POA: Diagnosis present

## 2017-07-12 DIAGNOSIS — Z111 Encounter for screening for respiratory tuberculosis: Secondary | ICD-10-CM | POA: Diagnosis not present

## 2017-07-12 DIAGNOSIS — G4700 Insomnia, unspecified: Secondary | ICD-10-CM | POA: Diagnosis not present

## 2017-07-12 DIAGNOSIS — R2689 Other abnormalities of gait and mobility: Secondary | ICD-10-CM | POA: Diagnosis not present

## 2017-07-12 DIAGNOSIS — M5430 Sciatica, unspecified side: Secondary | ICD-10-CM | POA: Diagnosis present

## 2017-07-12 DIAGNOSIS — M4727 Other spondylosis with radiculopathy, lumbosacral region: Secondary | ICD-10-CM | POA: Diagnosis not present

## 2017-07-12 DIAGNOSIS — D649 Anemia, unspecified: Secondary | ICD-10-CM | POA: Diagnosis present

## 2017-07-12 DIAGNOSIS — K5900 Constipation, unspecified: Secondary | ICD-10-CM | POA: Diagnosis not present

## 2017-07-12 LAB — BASIC METABOLIC PANEL
ANION GAP: 10 (ref 5–15)
BUN: 21 mg/dL — ABNORMAL HIGH (ref 6–20)
CALCIUM: 8.2 mg/dL — AB (ref 8.9–10.3)
CHLORIDE: 97 mmol/L — AB (ref 101–111)
CO2: 19 mmol/L — ABNORMAL LOW (ref 22–32)
CREATININE: 0.78 mg/dL (ref 0.44–1.00)
GFR calc non Af Amer: >60 mL/min (ref >60)
Glucose, Bld: 139 mg/dL — ABNORMAL HIGH (ref 65–99)
Potassium: 4.5 mmol/L (ref 3.5–5.1)
Sodium: 126 mmol/L — ABNORMAL LOW (ref 135–145)

## 2017-07-12 LAB — CBC
HEMATOCRIT: 26.6 % — AB (ref 36.0–46.0)
Hemoglobin: 9 g/dL — ABNORMAL LOW (ref 12.0–15.0)
MCH: 26.9 pg (ref 26.0–34.0)
MCHC: 33.8 g/dL (ref 30.0–36.0)
MCV: 79.4 fL (ref 78.0–100.0)
Platelets: 358 10*3/uL (ref 150–400)
RBC: 3.35 MIL/uL — ABNORMAL LOW (ref 3.87–5.11)
RDW: 15.1 % (ref 11.5–15.5)
WBC: 16.7 10*3/uL — AB (ref 4.0–10.5)

## 2017-07-12 LAB — PROTIME-INR

## 2017-07-12 LAB — ACETAMINOPHEN LEVEL

## 2017-07-12 MED ORDER — OXYCODONE-ACETAMINOPHEN 5-325 MG PO TABS
1.0000 | ORAL_TABLET | ORAL | Status: DC | PRN
  Administered 2017-07-12 – 2017-07-19 (×18): 1 via ORAL
  Filled 2017-07-12 (×20): qty 1

## 2017-07-12 MED ORDER — ATORVASTATIN CALCIUM 10 MG PO TABS
10.0000 mg | ORAL_TABLET | Freq: Every day | ORAL | Status: DC
  Administered 2017-07-12 – 2017-07-21 (×11): 10 mg via ORAL
  Filled 2017-07-12 (×13): qty 1

## 2017-07-12 MED ORDER — GABAPENTIN 100 MG PO CAPS
200.0000 mg | ORAL_CAPSULE | Freq: Three times a day (TID) | ORAL | Status: DC
  Filled 2017-07-12: qty 2

## 2017-07-12 MED ORDER — KETOROLAC TROMETHAMINE 15 MG/ML IJ SOLN
15.0000 mg | Freq: Once | INTRAMUSCULAR | Status: AC
Start: 2017-07-12 — End: 2017-07-12
  Administered 2017-07-12: 15 mg via INTRAVENOUS
  Filled 2017-07-12: qty 1

## 2017-07-12 MED ORDER — CYCLOBENZAPRINE HCL 5 MG PO TABS
5.0000 mg | ORAL_TABLET | Freq: Once | ORAL | Status: AC
  Administered 2017-07-12: 5 mg via ORAL
  Filled 2017-07-12: qty 1

## 2017-07-12 MED ORDER — SODIUM CHLORIDE 0.9 % IV SOLN
Freq: Once | INTRAVENOUS | Status: AC
  Administered 2017-07-12: 09:00:00 via INTRAVENOUS

## 2017-07-12 MED ORDER — GABAPENTIN 100 MG PO CAPS
100.0000 mg | ORAL_CAPSULE | Freq: Three times a day (TID) | ORAL | Status: DC
Start: 2017-07-12 — End: 2017-07-12
  Administered 2017-07-12: 100 mg via ORAL
  Filled 2017-07-12: qty 1

## 2017-07-12 MED ORDER — ACETAMINOPHEN 325 MG PO TABS
650.0000 mg | ORAL_TABLET | Freq: Four times a day (QID) | ORAL | Status: DC | PRN
  Administered 2017-07-12 – 2017-07-21 (×5): 650 mg via ORAL
  Filled 2017-07-12 (×5): qty 2

## 2017-07-12 MED ORDER — ONDANSETRON HCL 4 MG/2ML IJ SOLN
4.0000 mg | Freq: Four times a day (QID) | INTRAMUSCULAR | Status: DC | PRN
  Administered 2017-07-15: 4 mg via INTRAVENOUS
  Filled 2017-07-12: qty 2

## 2017-07-12 MED ORDER — OXYCODONE-ACETAMINOPHEN 5-325 MG PO TABS
1.0000 | ORAL_TABLET | Freq: Four times a day (QID) | ORAL | Status: DC | PRN
  Administered 2017-07-12: 1 via ORAL
  Filled 2017-07-12: qty 1

## 2017-07-12 MED ORDER — ACETAMINOPHEN 650 MG RE SUPP: 650.0000 mg | Freq: Four times a day (QID) | RECTAL | Status: DC | PRN

## 2017-07-12 MED ORDER — METOPROLOL SUCCINATE ER 50 MG PO TB24
50.0000 mg | ORAL_TABLET | Freq: Every day | ORAL | Status: DC
  Administered 2017-07-12 – 2017-07-22 (×11): 50 mg via ORAL
  Filled 2017-07-12 (×12): qty 1

## 2017-07-12 MED ORDER — NALOXONE HCL 0.4 MG/ML IJ SOLN
0.4000 mg | INTRAMUSCULAR | Status: DC | PRN
  Administered 2017-07-13: 0.4 mg via INTRAVENOUS
  Filled 2017-07-12: qty 1

## 2017-07-12 MED ORDER — ONDANSETRON HCL 4 MG PO TABS
4.0000 mg | ORAL_TABLET | Freq: Four times a day (QID) | ORAL | Status: DC | PRN
  Filled 2017-07-12: qty 1

## 2017-07-12 MED ORDER — ALBUTEROL SULFATE (2.5 MG/3ML) 0.083% IN NEBU: 2.5000 mg | INHALATION_SOLUTION | RESPIRATORY_TRACT | Status: DC | PRN

## 2017-07-12 MED ORDER — PREDNISONE 20 MG PO TABS
60.0000 mg | ORAL_TABLET | Freq: Every day | ORAL | Status: DC
  Administered 2017-07-12 – 2017-07-19 (×8): 60 mg via ORAL
  Filled 2017-07-12 (×8): qty 3

## 2017-07-12 MED ORDER — LOSARTAN POTASSIUM 50 MG PO TABS
100.0000 mg | ORAL_TABLET | Freq: Every day | ORAL | Status: DC
  Administered 2017-07-13 – 2017-07-22 (×10): 100 mg via ORAL
  Filled 2017-07-12 (×12): qty 2

## 2017-07-12 MED ORDER — ALENDRONATE SODIUM 70 MG PO TABS: 70.0000 mg | ORAL_TABLET | ORAL | Status: DC

## 2017-07-12 MED ORDER — WARFARIN - PHARMACIST DOSING INPATIENT: Freq: Every day | Status: DC

## 2017-07-12 NOTE — ED Notes (Signed)
Bed: SF68 Expected date:  Expected time:  Means of arrival:  Comments: Room 2

## 2017-07-12 NOTE — Progress Notes (Addendum)
Patient admitted to the unit. Pt is a&ox4. pale, bruising all over body > posterior BLE.Jodi Frank Pt c/o pain 10/10 in neck and RLE with increased HR sustaining 118-128 Afib.Jodi Frank Hospitalist contacted. Order received to give gabapentin and metoprolol 50 mg, per mar.

## 2017-07-12 NOTE — Progress Notes (Addendum)
ANTICOAGULATION CONSULT NOTE - Initial Consult  Pharmacy Consult for warfarin Indication: atrial fibrillation, mitral valve replacement  Allergies  Allergen Reactions  . Carvedilol Itching  . Ace Inhibitors Other (See Comments)    Reaction (??)  . Amlodipine Swelling  . Hydrochlorothiazide Other (See Comments)    Hyponatremia     Patient Measurements:   Heparin Dosing Weight:   Vital Signs: Temp: 98.9 F (37.2 C) (02/17 0548) Temp Source: Oral (02/17 0548) BP: 164/75 (02/17 0700) Pulse Rate: 124 (02/17 0700)  Labs: Recent Labs    07/11/17 1745 07/12/17 0515  HGB 11.0* 9.0*  HCT 32.9* 26.6*  PLT 414* 358  LABPROT >90.0* >90.0*  INR >10.00* >10.00*  CREATININE 0.74 0.78    Estimated Creatinine Clearance: 58.5 mL/min (by C-G formula based on SCr of 0.78 mg/dL).   Medical History: Past Medical History:  Diagnosis Date  . Broken hip ([place]) 10/2011   left  . Broken wrist   . Depression   . Diarrhea   . H/O long-term (current) use of anticoagulants   . High cholesterol   . Hypertension   . Memory difficulty 01/18/2016  . 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 Osteoporosis   . SIADH (syndrome of inappropriate ADH production) ([place])   . Vertigo 01/18/2016   Home warfarin: 5mg  daily, last dose 2/15  Assessment: [name] presents with R-sided sciatica.  She is on chronic warfarin therapy for atrial fibrillation and mitral valve replacement.  Her INR is >10 at time of presentation to ED.  Vit K 10mg  PO given.    Her recent (2/4) warfarin f/u revealed INR = 6.9.  She was instructed to hold x 2 days and resume at 5mg  daily.    Today, 07/12/2017  INR remains > 10 this morning following Vit K PO 2/16  CBC: Hgb decreased but consistent with values in December (range 8-9 at that time).  Platelets WNL  Corticosteroids can potentiate warfarin's effects (as well as increase risk of bleeding)  Goal of Therapy:  INR 2.5 - 3.5 (per warfarin clinic notes)   Plan:    No warfarin for INR > 10  Daily INR  Monitor for bleeding  Doreene Eland, PharmD, BCPS.   Pager: [phone] 07/12/2017 8:29 AM

## 2017-07-12 NOTE — Progress Notes (Signed)
PROGRESS NOTE    Jodi Frank  ZOX:[id number] DOB: 1941/04/09 DOA: 07/11/2017 PCP: Gildardo Cranker, DO    Brief Narrative:  77 y.o. female with medical history significant of A. 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   Assessment & Plan:   Active Problems:    Right sided sciatica -  Pt on prednisone - will add gabapentin 100 mg po tid - if worsening in symptoms and or weakness will consult neurosurgeon    Supratherapeutic INR - Pt given Vitamin K, will reassess INR levels next am.  PAF - supratherapeutic INR  Currently being held - rate controlled on B blocker.     Acute urinary retention - Pt had foley in place but requested its removal on my exam.    Leukocytosis - etiology uncertain suspect it is stress related from principle problem.   DVT prophylaxis: warfarin with INR supratherapeutic Code Status: dnr Family Communication: None at bedside.  Disposition Plan: pending improvement in condition   Consultants:   none   Procedures: none   Antimicrobials: none   Subjective: Pt requested removal of foley catheter due to discomfort. Leg discomfort is about the same and not worse.   Objective: Vitals:   07/12/17 1200 07/12/17 1300 07/12/17 1414 07/12/17 1615  BP: (!) 149/59 132/87 (!) 145/76 (!) 144/76  Pulse: (!) 107 (!) 114 (!) 124 (!) 122  Resp: (!) 22 (!) 23 20 20   Temp:      TempSrc:      SpO2: 97% 94% 94% 97%    Intake/Output Summary (Last 24 hours) at 07/12/2017 1727 Last data filed at 07/12/2017 1656 Gross per 24 hour  Intake 3000 ml  Output 3400 ml  Net -400 ml    Examination:  General exam: Appears calm and comfortable, in nad. Respiratory system: Clear to auscultation. Respiratory effort normal. Equal chest rise.  Cardiovascular system: S1 & S2 heard,  RRR. No JVD, murmurs, rubs, gallops  Gastrointestinal system: Abdomen is nondistended, soft and nontender. No organomegaly or masses felt. Normal bowel sounds heard. Central nervous system: Alert and oriented. No focal neurological deficits. Extremities: Symmetric 5 x 5 power. Skin: No rashes, lesions or ulcers on limited exam. Psychiatry:  Mood & affect appropriate.     Data Reviewed: I have personally reviewed following labs and imaging studies  CBC: Recent Labs  Lab 07/11/17 1745 07/12/17 0515  WBC 16.4* 16.7*  NEUTROABS 14.4*  --   HGB 11.0* 9.0*  HCT 32.9* 26.6*  MCV 79.7 79.4  PLT 414* 811   Basic Metabolic Panel: Recent Labs  Lab 07/11/17 1745 07/12/17 0515  NA 130* 126*  K 4.3 4.5  CL 98* 97*  CO2 20* 19*  GLUCOSE 124* 139*  BUN 23* 21*  CREATININE 0.74 0.78  CALCIUM 8.7* 8.2*   GFR: Estimated Creatinine Clearance: 58.5 mL/min (by C-G formula based on SCr of 0.78 mg/dL). Liver Function Tests: No results for input(s): AST, ALT, ALKPHOS, BILITOT, PROT, ALBUMIN in the last 168 hours. No results for input(s): LIPASE, AMYLASE in the last 168 hours. No results for input(s): AMMONIA in the last 168 hours. Coagulation Profile: Recent Labs  Lab 07/11/17 1745 07/12/17 0515  INR >10.00* >10.00*   Cardiac Enzymes: No results for input(s): CKTOTAL, CKMB, CKMBINDEX, TROPONINI in the last 168 hours. BNP (last 3 results) No results for input(s): PROBNP in the last  8760 hours. HbA1C: No results for input(s): HGBA1C in the last 72 hours. CBG: No results for input(s): GLUCAP in the last 168 hours. Lipid Profile: No results for input(s): CHOL, HDL, LDLCALC, TRIG, CHOLHDL, LDLDIRECT in the last 72 hours. Thyroid Function Tests: No results for input(s): TSH, T4TOTAL, FREET4, T3FREE, THYROIDAB in the last 72 hours. Anemia Panel: No results for input(s): VITAMINB12, FOLATE, FERRITIN, TIBC, IRON, RETICCTPCT in the last 72 hours. Sepsis Labs: No results for input(s):  PROCALCITON, LATICACIDVEN in the last 168 hours.  No results found for this or any previous visit (from the past 240 hour(s)).   Radiology Studies: Mr Lumbar Spine Wo Contrast  Result Date: 07/11/2017 CLINICAL DATA:  Back pain. EXAM: MRI LUMBAR SPINE WITHOUT CONTRAST TECHNIQUE: Multiplanar, multisequence MR imaging of the lumbar spine was performed. No intravenous contrast was administered. COMPARISON:  CT abdomen pelvis dated May 06, 2017. FINDINGS: Segmentation:  Standard. Alignment:  Trace anterolisthesis at L4-L5. Vertebrae: No fracture, evidence of discitis, or bone lesion. Degenerative type 2 Modic endplate changes at U4-Q0 and L3-L4. Type 1 Modic changes at L1-L2. Conus medullaris and cauda equina: Conus extends to the L1-L2 level. Conus and cauda equina appear normal. Paraspinal and other soft tissues: Negative.  Distended bladder. Disc levels: T10-T11: Only seen on the sagittal images.  Negative. T11-T12: Only seen on the sagittal images.  Negative. T12-L1:  Tiny disc bulge.  No stenosis. L1-L2: Small diffuse disc bulge and mild bilateral facet arthropathy. No stenosis. L2-L3: Small diffuse disc bulge and mild bilateral facet arthropathy. No stenosis. L3-L4: Small diffuse disc bulge and mild bilateral facet arthropathy. No stenosis. L4-L5: Diffuse disc bulge and moderate bilateral facet arthropathy. Prominent posterior epidural fat. Mild central spinal canal stenosis. Mild right lateral recess narrowing with possible irritation of the descending right L5 nerve root by the disc bulge. No neuroforaminal stenosis. L5-S1: Small diffuse disc bulge and mild bilateral facet arthropathy. No stenosis. IMPRESSION: 1. Mild multilevel degenerative changes throughout the lumbar spine as described above, worst at L4-L5 where there is mild central spinal canal stenosis and possible irritation of the descending right L5 nerve root. Electronically Signed   By: Titus Dubin M.D.   On: 07/11/2017 17:30   Ct  Abdomen Pelvis W Contrast  Result Date: 07/11/2017 CLINICAL DATA:  Severe back pain with sciatica.  Difficulty voiding. EXAM: CT ABDOMEN AND PELVIS WITH CONTRAST TECHNIQUE: Multidetector CT imaging of the abdomen and pelvis was performed using the standard protocol following bolus administration of intravenous contrast. CONTRAST:  120mL ISOVUE-300 IOPAMIDOL (ISOVUE-300) INJECTION 61% COMPARISON:  None. 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 Cardiomegaly without significant pericardial effusion or thickening. Aortic atherosclerosis of the included thoracic aorta. Mitral annular calcifications are present. Dependent atelectasis is seen at each lung base. Subsegmental atelectasis and/or scarring is also noted. Small hiatal hernia with minimal retained fluid in the distal esophagus. Hepatobiliary: Fatty infiltration about the falciform. Distended gallbladder without stones. No wall thickening. No enhancing mass lesions of the liver. No biliary dilatation is identified. Pancreas: Normal Spleen: Normal Adrenals/Urinary Tract: Bilateral small hypodensities within both kidneys compatible with cysts but too small to further characterize largest is posterior off the left kidney measuring 7 mm. No nephrolithiasis nor obstructive uropathy. Normal bilateral adrenal glands. Distended urinary bladder measuring 10.8 x 20 x 14 cm (volume = 1600 cm^3) without focal mural thickening or calculus. Stomach/Bowel: Stomach is within normal limits. Appendix appears normal. No evidence of bowel wall thickening, distention, or inflammatory changes.  Vascular/Lymphatic: Aortic atherosclerosis. No enlarged abdominal or pelvic lymph nodes. Reproductive: Uterus and bilateral adnexa are unremarkable. Other: No abdominal wall hernia or abnormality. No abdominopelvic ascites. Musculoskeletal: Moderate to marked disc space narrowing T12 through L4 with severely affecting L3-4. Minimal grade 1  anterolisthesis of L4 on L5 likely on the basis of degenerative disc and facet arthropathy. Broad-based disc bulge L4-5 and L5-S1. No acute osseous abnormality. IMPRESSION: 1. Marked distention of the urinary bladder with volume estimated at 1.6 L question neurogenic bladder. 2. 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 3. 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 Disc bulges at L4-5 and L5-S1. Electronically Signed   By: Ashley Royalty M.D.   On: 07/11/2017 21:49    Scheduled Meds: . atorvastatin  10 mg Oral QHS  . LORazepam  0.5 mg Intravenous Once  . losartan  100 mg Oral Daily  . metoprolol succinate  50 mg Oral Daily  . predniSONE  60 mg Oral Q breakfast  . Warfarin - Pharmacist Dosing Inpatient   Does not apply q1800   Continuous Infusions: . sodium chloride Stopped (07/12/17 1656)     LOS: 0 days    Time spent: > 35 minutes  Velvet Bathe, MD [facility] Pager [phone]  If 7PM-7AM, please contact night-coverage [email or web address] Password [facility] 07/12/2017, 5:27 PM

## 2017-07-12 NOTE — Progress Notes (Signed)
PT Cancellation Note  Patient Details Name: Jodi Frank MRN: [id number] DOB: 09-13-1940   Cancelled Treatment:    Reason Eval/Treat Not Completed: Medical issues which prohibited therapy. INR remains > 10 this AM will proceed with PT Eval when stable.    Claretha Cooper 07/12/2017, 8:28 AM [name] PT [phone]

## 2017-07-13 ENCOUNTER — Ambulatory Visit: Payer: Medicare Other | Admitting: Pharmacotherapy

## 2017-07-13 ENCOUNTER — Telehealth: Payer: Self-pay

## 2017-07-13 LAB — PROTIME-INR
INR: >10
Prothrombin Time: >90 seconds — ABNORMAL HIGH (ref 11.4–15.2)

## 2017-07-13 LAB — GLUCOSE, CAPILLARY: Glucose-Capillary: 129 mg/dL — ABNORMAL HIGH (ref 65–99)

## 2017-07-13 MED ORDER — PHYTONADIONE 5 MG PO TABS
10.0000 mg | ORAL_TABLET | Freq: Once | ORAL | Status: AC
  Administered 2017-07-13: 10 mg via ORAL
  Filled 2017-07-13 (×2): qty 2

## 2017-07-13 NOTE — Progress Notes (Signed)
PT Cancellation Note  Patient Details Name: TREVIA NOP MRN: [id number] DOB: 17-Sep-1940   Cancelled Treatment:    Reason Eval/Treat Not Completed: Medical issues which prohibited therapy. INR remains elevated-per chart >10. Will continue to hold PT for now.    Weston Anna, MPT Pager: [phone]

## 2017-07-13 NOTE — Telephone Encounter (Signed)
Pam, patient's daughter called requesting to speak directly with Dr.Carter. Pam is concerned about her mother's health issues. Patient is currently hospitalized and the daughter is concerned about her mothers memory and other medical issues. PT/INR greater than 10, patient is refusing to talk to staff at the hospital.   Pam lives in [place] and trying to assist from a distance and needs the help of Dr.Carter.    Pam aware Dr.Carter is out of office today.

## 2017-07-13 NOTE — Progress Notes (Signed)
Pt would not wake up to NT taking vitals. Pt unresponsive to sternal rub and pain. VSS: BP 118/39, temp 97.9, HR 90, O2 97%. CBG 129. Pupils equal, brisk, and reactive. Blount, on call notified. Narcan 0.4 mg given per on call instruction.  Pt began opening eyes, turning self in bed and pulling covers over her head, but would not verbally respond to staff. Will continue to monitor pt.

## 2017-07-13 NOTE — Progress Notes (Signed)
Cherry for warfarin Indication: atrial fibrillation, mitral valve replacement  Allergies  Allergen Reactions  . Carvedilol Itching  . Ace Inhibitors Other (See Comments)    Reaction (??)  . Amlodipine Swelling  . Hydrochlorothiazide Other (See Comments)    Hyponatremia     Patient Measurements: Height: 5\' 3"  (160 cm) Weight: 161 lb 13.1 oz (73.4 kg) IBW/kg (Calculated) : 52.4  Vital Signs: Temp: 97.9 F (36.6 C) (02/18 0448) Temp Source: Axillary (02/18 0448) BP: 118/39 (02/18 0448) Pulse Rate: 90 (02/18 0448)  Labs: Recent Labs    07/11/17 1745 07/12/17 0515 07/13/17 0543  HGB 11.0* 9.0*  --   HCT 32.9* 26.6*  --   PLT 414* 358  --   LABPROT >90.0* >90.0* >90.0*  INR >10.00* >10.00* >10.00*  CREATININE 0.74 0.78  --     Estimated Creatinine Clearance: 58.3 mL/min (by C-G formula based on SCr of 0.78 mg/dL).   Medical History: Past Medical History:  Diagnosis Date  . Broken hip ([place]ross) 10/2011   left  . Broken wrist   . Depression   . Diarrhea   . H/O long-term (current) use of anticoagulants   . High cholesterol   . Hypertension   . Memory difficulty 01/18/2016  . 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 Osteoporosis   . SIADH (syndrome of inappropriate ADH production) ([place])   . Vertigo 01/18/2016   Home warfarin: 5mg  daily, last dose 2/15  Assessment: [name] presents with R-sided sciatica.  She is on chronic warfarin therapy for atrial fibrillation and mitral valve replacement.  Her INR is >10 at time of presentation to ED.  Vit K 10mg  PO given at that time (2/16).  Her recent (2/4) warfarin f/u revealed INR = 6.9.  She was instructed to hold x 2 days and resume at 5mg  daily.    Today, 07/13/2017  INR remains > 10 this morning following Vit K PO 2/16  CBC: Hgb decreased but consistent with values in December (range 8-9 at that time).  Platelets WNL  Corticosteroids can potentiate warfarin's  effects (as well as increase risk of bleeding)  Goal of Therapy:  INR 2.5 - 3.5 (per warfarin clinic notes)   Plan:   No warfarin for INR > 10  Daily INR  Monitor for bleeding  Would consider a second dose of vitamin K since INR has not improved. Would consider Vitamin 5 mg PO today.  Hershal Coria, PharmD, BCPS Pager: [phone] 07/13/2017 8:16 AM

## 2017-07-13 NOTE — Care Management Note (Signed)
Case Management Note  Patient Details  Name: Jodi Frank MRN: [id number] Date of Birth: [birth year]  Subjective/Objective:77 y/o f admitted w/R side sciata. From home. PT cons-await recc.                    Action/Plan:d/c plan home.   Expected Discharge Date:  07/14/17               Expected Discharge Plan:  [place]  In-House Referral:     Discharge planning Services  CM Consult  Post Acute Care Choice:    Choice offered to:     DME Arranged:    DME Agency:     HH Arranged:    HH Agency:     Status of Service:  In process, will continue to follow  If discussed at Long Length of Stay Meetings, dates discussed:    Additional Comments:  Dessa Phi, RN 07/13/2017, 9:14 AM

## 2017-07-13 NOTE — Telephone Encounter (Signed)
Recommend that she speaks with pt's doctors in the hospital as they are seeing her every day and can answer her questions more thoroughly than me. She can easily have the pt's nurse contact the providers and request that they call her directly to give her an update on her mom's condition

## 2017-07-13 NOTE — Progress Notes (Signed)
CRITICAL VALUE ALERT  Critical Value:  INR>10  Date & Time Notied:  0725 07/13/2017  Provider Notified: Wendee Beavers, MD  Orders Received/Actions taken: Administer oral Vitamin K

## 2017-07-13 NOTE — Progress Notes (Signed)
PROGRESS NOTE    SIBLE STRALEY  INO:[id number] DOB: 03-15-1941 DOA: 07/11/2017 PCP: Gildardo Cranker, DO    Brief Narrative:  77 y.o. female with medical history significant of A. 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   Assessment & Plan:   Active Problems:    Right sided sciatica - Pt on prednisone - Added gabapentin 100 mg po tid but patient somnolent today as such will hold - pain seems well controlled currently  Metabolic Encephalopathy - Most likely secondary to medications (flexeril, tramadol, percocet, gabapentin) Will d/c flexeril, gabapentin. And monitor as well as be careful about adding further medications which may make patient somnolent. - due to hypersomnolence patient not eating or drinking fluids well as such will continue IVF's    Supratherapeutic INR - Will readminister Vitamin K, will reassess INR levels daily  PAF - supratherapeutic INR  Currently being held - rate controlled on B blocker. Pt was refusing to take medication but after redirecting we were able to get patient to take this medication.     Acute urinary retention - Pt had foley in place but requested its removal on my exam. Producing urine    Leukocytosis - etiology uncertain suspect it is stress related from principle problem.   DVT prophylaxis: warfarin with INR supratherapeutic Code Status: dnr Family Communication: None at bedside.  Disposition Plan: pending improvement in condition   Consultants:   none   Procedures: none   Antimicrobials: none   Subjective: Pt is somnolent today.  Objective: Vitals:   07/12/17 1835 07/12/17 2127 07/13/17 0448 07/13/17 0623  BP: (!) 143/74 123/77 (!) 118/39   Pulse: (!) 130 96 90   Resp: 20 20 20    Temp: 98.3 F (36.8 C) 97.6 F (36.4 C) 97.9 F  (36.6 C)   TempSrc: Oral Oral Axillary   SpO2: 100% 95% 97%   Weight:    73.4 kg (161 lb 13.1 oz)  Height: 5\' 3"  (1.6 m)       Intake/Output Summary (Last 24 hours) at 07/13/2017 1348 Last data filed at 07/13/2017 1044 Gross per 24 hour  Intake 1000 ml  Output 400 ml  Net 600 ml    Examination:  General exam: Pt is somnolent but arousable  Respiratory system: Clear to auscultation. Respiratory effort normal. Equal chest rise.  Cardiovascular system: S1 & S2 heard, No JVD, murmurs, rubs, gallops  Gastrointestinal system: Abdomen is nondistended, soft and nontender. No organomegaly or masses felt. Normal bowel sounds heard. Central nervous system: Alert and oriented. No focal neurological deficits. Extremities: Symmetric 5 x 5 power. Skin: No rashes, lesions or ulcers on limited exam. Psychiatry:  Mood & affect appropriate.     Data Reviewed: I have personally reviewed following labs and imaging studies  CBC: Recent Labs  Lab 07/11/17 1745 07/12/17 0515  WBC 16.4* 16.7*  NEUTROABS 14.4*  --   HGB 11.0* 9.0*  HCT 32.9* 26.6*  MCV 79.7 79.4  PLT 414* 096   Basic Metabolic Panel: Recent Labs  Lab 07/11/17 1745 07/12/17 0515  NA 130* 126*  K 4.3 4.5  CL 98* 97*  CO2 20* 19*  GLUCOSE 124* 139*  BUN 23* 21*  CREATININE 0.74 0.78  CALCIUM 8.7* 8.2*   GFR: Estimated Creatinine Clearance: 58.3 mL/min (by C-G formula based on SCr of 0.78 mg/dL). Liver Function Tests: No results  for input(s): AST, ALT, ALKPHOS, BILITOT, PROT, ALBUMIN in the last 168 hours. No results for input(s): LIPASE, AMYLASE in the last 168 hours. No results for input(s): AMMONIA in the last 168 hours. Coagulation Profile: Recent Labs  Lab 07/11/17 1745 07/12/17 0515 07/13/17 0543  INR >10.00* >10.00* >10.00*   Cardiac Enzymes: No results for input(s): CKTOTAL, CKMB, CKMBINDEX, TROPONINI in the last 168 hours. BNP (last 3 results) No results for input(s): PROBNP in the last 8760  hours. HbA1C: No results for input(s): HGBA1C in the last 72 hours. CBG: Recent Labs  Lab 07/13/17 0511  GLUCAP 129*   Lipid Profile: No results for input(s): CHOL, HDL, LDLCALC, TRIG, CHOLHDL, LDLDIRECT in the last 72 hours. Thyroid Function Tests: No results for input(s): TSH, T4TOTAL, FREET4, T3FREE, THYROIDAB in the last 72 hours. Anemia Panel: No results for input(s): VITAMINB12, FOLATE, FERRITIN, TIBC, IRON, RETICCTPCT in the last 72 hours. Sepsis Labs: No results for input(s): PROCALCITON, LATICACIDVEN in the last 168 hours.  No results found for this or any previous visit (from the past 240 hour(s)).   Radiology Studies: Mr Lumbar Spine Wo Contrast  Result Date: 07/11/2017 CLINICAL DATA:  Back pain. EXAM: MRI LUMBAR SPINE WITHOUT CONTRAST TECHNIQUE: Multiplanar, multisequence MR imaging of the lumbar spine was performed. No intravenous contrast was administered. COMPARISON:  CT abdomen pelvis dated May 06, 2017. FINDINGS: Segmentation:  Standard. Alignment:  Trace anterolisthesis at L4-L5. Vertebrae: No fracture, evidence of discitis, or bone lesion. Degenerative type 2 Modic endplate changes at W7-P7 and L3-L4. Type 1 Modic changes at L1-L2. Conus medullaris and cauda equina: Conus extends to the L1-L2 level. Conus and cauda equina appear normal. Paraspinal and other soft tissues: Negative.  Distended bladder. Disc levels: T10-T11: Only seen on the sagittal images.  Negative. T11-T12: Only seen on the sagittal images.  Negative. T12-L1:  Tiny disc bulge.  No stenosis. L1-L2: Small diffuse disc bulge and mild bilateral facet arthropathy. No stenosis. L2-L3: Small diffuse disc bulge and mild bilateral facet arthropathy. No stenosis. L3-L4: Small diffuse disc bulge and mild bilateral facet arthropathy. No stenosis. L4-L5: Diffuse disc bulge and moderate bilateral facet arthropathy. Prominent posterior epidural fat. Mild central spinal canal stenosis. Mild right lateral recess  narrowing with possible irritation of the descending right L5 nerve root by the disc bulge. No neuroforaminal stenosis. L5-S1: Small diffuse disc bulge and mild bilateral facet arthropathy. No stenosis. IMPRESSION: 1. Mild multilevel degenerative changes throughout the lumbar spine as described above, worst at L4-L5 where there is mild central spinal canal stenosis and possible irritation of the descending right L5 nerve root. Electronically Signed   By: Titus Dubin M.D.   On: 07/11/2017 17:30   Ct Abdomen Pelvis W Contrast  Result Date: 07/11/2017 CLINICAL DATA:  Severe back pain with sciatica.  Difficulty voiding. EXAM: CT ABDOMEN AND PELVIS WITH CONTRAST TECHNIQUE: Multidetector CT imaging of the abdomen and pelvis was performed using the standard protocol following bolus administration of intravenous contrast. CONTRAST:  171mL ISOVUE-300 IOPAMIDOL (ISOVUE-300) INJECTION 61% COMPARISON:  None. 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 Cardiomegaly without significant pericardial effusion or thickening. Aortic atherosclerosis of the included thoracic aorta. Mitral annular calcifications are present. Dependent atelectasis is seen at each lung base. Subsegmental atelectasis and/or scarring is also noted. Small hiatal hernia with minimal retained fluid in the distal esophagus. Hepatobiliary: Fatty infiltration about the falciform. Distended gallbladder without stones. No wall thickening. No enhancing mass lesions of the liver. No biliary dilatation  is identified. Pancreas: Normal Spleen: Normal Adrenals/Urinary Tract: Bilateral small hypodensities within both kidneys compatible with cysts but too small to further characterize largest is posterior off the left kidney measuring 7 mm. No nephrolithiasis nor obstructive uropathy. Normal bilateral adrenal glands. Distended urinary bladder measuring 10.8 x 20 x 14 cm (volume = 1600 cm^3) without focal mural thickening or  calculus. Stomach/Bowel: Stomach is within normal limits. Appendix appears normal. No evidence of bowel wall thickening, distention, or inflammatory changes. Vascular/Lymphatic: Aortic atherosclerosis. No enlarged abdominal or pelvic lymph nodes. Reproductive: Uterus and bilateral adnexa are unremarkable. Other: No abdominal wall hernia or abnormality. No abdominopelvic ascites. Musculoskeletal: Moderate to marked disc space narrowing T12 through L4 with severely affecting L3-4. Minimal grade 1 anterolisthesis of L4 on L5 likely on the basis of degenerative disc and facet arthropathy. Broad-based disc bulge L4-5 and L5-S1. No acute osseous abnormality. IMPRESSION: 1. Marked distention of the urinary bladder with volume estimated at 1.6 L question neurogenic bladder. 2. 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 3. 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 Disc bulges at L4-5 and L5-S1. Electronically Signed   By: Ashley Royalty M.D.   On: 07/11/2017 21:49    Scheduled Meds: . atorvastatin  10 mg Oral QHS  . LORazepam  0.5 mg Intravenous Once  . losartan  100 mg Oral Daily  . metoprolol succinate  50 mg Oral Daily  . phytonadione  10 mg Oral Once  . predniSONE  60 mg Oral Q breakfast  . Warfarin - Pharmacist Dosing Inpatient   Does not apply q1800   Continuous Infusions: . sodium chloride 75 mL/hr at 07/13/17 1039     LOS: 1 day    Time spent: > 35 minutes  Velvet Bathe, MD [facility] Pager [phone]  If 7PM-7AM, please contact night-coverage [email or web address] Password [email or web address] 07/13/2017, 1:48 PM

## 2017-07-13 NOTE — Plan of Care (Signed)
  Progressing Education: Knowledge of General Education information will improve 07/13/2017 2317 - Progressing by Talbert Forest, RN Pain Managment: General experience of comfort will improve 07/13/2017 2317 - Progressing by Talbert Forest, RN Safety: Ability to remain free from injury will improve 07/13/2017 2317 - Progressing by Talbert Forest, RN

## 2017-07-14 LAB — PROTIME-INR
INR: 6.77
Prothrombin Time: 58.3 seconds — ABNORMAL HIGH (ref 11.4–15.2)

## 2017-07-14 NOTE — Plan of Care (Signed)
  Progressing Clinical Measurements: Ability to maintain clinical measurements within normal limits will improve 07/14/2017 2206 - Progressing by Talbert Forest, RN Will remain free from infection 07/14/2017 2206 - Progressing by Talbert Forest, RN Diagnostic test results will improve 07/14/2017 2206 - Progressing by Talbert Forest, RN Respiratory complications will improve 07/14/2017 2206 - Progressing by Talbert Forest, RN Cardiovascular complication will be avoided 07/14/2017 2206 - Progressing by Talbert Forest, RN Nutrition: Adequate nutrition will be maintained 07/14/2017 2206 - Progressing by Talbert Forest, RN Safety: Ability to remain free from injury will improve 07/14/2017 2206 - Progressing by Talbert Forest, RN

## 2017-07-14 NOTE — Evaluation (Addendum)
Physical Therapy Evaluation Patient Details Name: Jodi Frank MRN: [id number] DOB: Sep 05, 1940 Today's Date: 07/14/2017   History of Present Illness  77 yo female admitted with R side sciatica. MRI (+) disc bulge L4-L5, L5-S1, spinal canal stenosis, possible L5 nerve root. Hx of fall, A fib, subgaleal hematoma    Clinical Impression  On eval, pt required Mod-Max assist +2 for bed mobility. She was able to sit EOB for ~4-5 minutes with Min assist. LOB x 1 when pt abruptly went into supine due to pain. Mobility is significantly limited by R LE pain. Pt c/o R LE numbness while sitting EOB. Max encouragement for pt to participate and progress activity. Also noted significant bruising all over pt s body, especially posterior legs. Will follow and progress activity as pt is able to tolerate. Recommend SNF.     Follow Up Recommendations SNF    Equipment Recommendations  None recommended by PT    Recommendations for Other Services       Precautions / Restrictions Precautions Precautions: Fall Restrictions Weight Bearing Restrictions: No      Mobility  Bed Mobility Overal bed mobility: Needs Assistance Bed Mobility: Sidelying to Sit;Sit to Supine   Sidelying to sit: Mod assist;+2 for physical assistance;+2 for safety/equipment   Sit to supine: Max assist;+2 for physical assistance;+2 for safety/equipment   General bed mobility comments: Assist for trunk and bil LEs. Increased time. Max encouragement for participation.   Transfers Overall transfer level: Needs assistance Equipment used: Rolling walker (2 wheeled) Transfers: Sit to/from Stand Sit to Stand: Max assist;+2 physical assistance;+2 safety/equipment         General transfer comment: Attempted x 2. Pt only able to tolerate clearing bottom from bed. She repeatedly stated "I can't.Marland KitchenMarland KitchenI can't. My leg hurts." Pt c/o R LE feeling numb once sitting EOB  Ambulation/Gait                Stairs            Wheelchair  Mobility    Modified Rankin (Stroke Patients Only)       Balance Overall balance assessment: Needs assistance   Sitting balance-Leahy Scale: Fair Sitting balance - Comments: Pt able to sit EOB with Min guard but she will suddenly allow herself to fall backwards when pain hits                                     Pertinent Vitals/Pain Pain Assessment: Faces Pain Score: 10-Worst pain ever Pain Location: R leg Pain Descriptors / Indicators: Moaning;Grimacing;Guarding;Crying;Numbness Pain Intervention(s): Limited activity within patient's tolerance;Repositioned    Home Living Family/patient expects to be discharged to:: Skilled nursing facility Living Arrangements: Alone Available Help at Discharge: Friend(s) Type of Home: House Home Access: Stairs to enter   Technical brewer of Steps: 1 Home Layout: One level Home Equipment: Cane - single point      Prior Function Level of Independence: Independent               Hand Dominance        Extremity/Trunk Assessment   Upper Extremity Assessment Upper Extremity Assessment: Generalized weakness    Lower Extremity Assessment Lower Extremity Assessment: (unable to assess due to pain and pt's unwillingness to put forth full effort)  Bruising all along posterior of legs.   Cervical / Trunk Assessment Cervical / Trunk Assessment: Normal  Communication   Communication: No difficulties  Cognition Arousal/Alertness: Awake/alert(drowsy) Behavior During Therapy: WFL for tasks assessed/performed Overall Cognitive Status: No family/caregiver present to determine baseline cognitive functioning                                 General Comments: appears WFL-difficult to completely assess due to drowsiness and pt's limited participation with history taking      General Comments      Exercises     Assessment/Plan    PT Assessment Patient needs continued PT services  PT Problem List  Decreased strength;Decreased balance;Decreased mobility;Decreased activity tolerance;Pain;Decreased knowledge of use of DME       PT Treatment Interventions DME instruction;Gait training;Functional mobility training;Therapeutic activities;Balance training;Patient/family education;Therapeutic exercise    PT Goals (Current goals can be found in the Care Plan section)  Acute Rehab PT Goals Patient Stated Goal: less pain. "make me stop hurting" PT Goal Formulation: With patient Time For Goal Achievement: 07/28/17 Potential to Achieve Goals: Fair    Frequency Min 2X/week   Barriers to discharge        Co-evaluation               AM-PAC PT "6 Clicks" Daily Activity  Outcome Measure Difficulty turning over in bed (including adjusting bedclothes, sheets and blankets)?: Unable Difficulty moving from lying on back to sitting on the side of the bed? : Unable Difficulty sitting down on and standing up from a chair with arms (e.g., wheelchair, bedside commode, etc,.)?: Unable Help needed moving to and from a bed to chair (including a wheelchair)?: Total Help needed walking in hospital room?: Total Help needed climbing 3-5 steps with a railing? : Total 6 Click Score: 6    End of Session   Activity Tolerance: Patient limited by pain Patient left: in bed;with call bell/phone within reach;with bed alarm set   PT Visit Diagnosis: Pain;Other abnormalities of gait and mobility (R26.89);Difficulty in walking, not elsewhere classified (R26.2)    Time: 6734-1937 PT Time Calculation (min) (ACUTE ONLY): 14 min   Charges:   PT Evaluation $PT Eval Moderate Complexity: 1 Mod     PT G Codes:         Weston Anna, MPT Pager: [phone]

## 2017-07-14 NOTE — Progress Notes (Signed)
Foley cath d/c'd 8 hours ago.  Pt unable to void, bladder scan showed 500-600cc of urine.  Performed in and out cath with 600 cc of urine returned.

## 2017-07-14 NOTE — Progress Notes (Signed)
Jodi Frank for warfarin Indication: atrial fibrillation, mitral valve replacement  Allergies  Allergen Reactions  . Carvedilol Itching  . Ace Inhibitors Other (See Comments)    Reaction (??)  . Amlodipine Swelling  . Hydrochlorothiazide Other (See Comments)    Hyponatremia     Patient Measurements: Height: 5\' 3"  (160 cm) Weight: 161 lb 13.1 oz (73.4 kg) IBW/kg (Calculated) : 52.4  Vital Signs: Temp: 97.8 F (36.6 C) (02/19 0520) Temp Source: Oral (02/19 0520) BP: 130/42 (02/19 0520) Pulse Rate: 70 (02/19 0520)  Labs: Recent Labs    07/11/17 1745 07/12/17 0515 07/13/17 0543 07/14/17 0520  HGB 11.0* 9.0*  --   --   HCT 32.9* 26.6*  --   --   PLT 414* 358  --   --   LABPROT >90.0* >90.0* >90.0* 58.3*  INR >10.00* >10.00* >10.00* 6.77*  CREATININE 0.74 0.78  --   --     Estimated Creatinine Clearance: 58.3 mL/min (by C-G formula based on SCr of 0.78 mg/dL).   Medical History: Past Medical History:  Diagnosis Date  . Broken hip ([place]) 10/2011   left  . Broken wrist   . Depression   . Diarrhea   . H/O long-term (current) use of anticoagulants   . High cholesterol   . Hypertension   . Memory difficulty 01/18/2016  . 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 Osteoporosis   . SIADH (syndrome of inappropriate ADH production) ([place])   . Vertigo 01/18/2016   Home warfarin: 5mg  daily, last dose 2/15  Assessment: Jodi Frank presents with R-sided sciatica.  She is on chronic warfarin therapy for atrial fibrillation and mitral valve replacement.  Her INR is >10 at time of presentation to ED.  Vit K 10mg  PO given at that time (2/16).  Her recent (2/4) warfarin f/u revealed INR = 6.9.  She was instructed to hold x 2 days and resume at 5mg  daily.    Today, 07/14/2017  INR supratherapeutic but starting to decrease following another dose of Vitamin K.  CBC 2/17: Hgb decreased but consistent with values in December (range  8-9 at that time).  Platelets WNL  Corticosteroids can potentiate warfarin's effects (as well as increase risk of bleeding)  Goal of Therapy:  INR 2.5 - 3.5 (per warfarin clinic notes)   Plan:   No warfarin for elevated INR  Daily INR  Monitor for bleeding, f/u CBC in AM  Jodi Frank, PharmD, BCPS Pager: [phone] 07/14/2017 8:03 AM

## 2017-07-14 NOTE — Progress Notes (Signed)
PROGRESS NOTE    Jodi Frank  BDZ:[id number] DOB: November 11, 1940 DOA: 07/11/2017 PCP: Gildardo Cranker, DO    Brief Narrative:  77 y.o. female with medical history significant of A. 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   Complicated hospital course because of hypersomnolence due to polypharmacy. Medications adjusted and patient is more alert currently. Also there is concern about underlying undiagnosed dementia.   Assessment & Plan:   Active Problems:    Right sided sciatica - Pt on prednisone - Added gabapentin but patient became hypersomnolent. We can try this again once patient is more alert and off other causative agents listed below.  Metabolic Encephalopathy - Most likely secondary to medications (flexeril, tramadol, percocet, gabapentin) Will d/c flexeril, gabapentin and tramadol. And monitor as well as be careful about adding further medications which may make patient somnolent. - While patient is not eating well will hydrate with IVF's. This was discussed with nursing today.     Supratherapeutic INR - INR trending down. Given history of mechanical heart valve will not administer more Vitamin K and monitor. (INR 2.5-3.5 range)  PAF - Supratherapeutic INR  Currently being held. S/p given Vitamin K x 2 (on different days) - Rate controlled on B blocker. Pt was refusing to take medication but after redirecting we were able to get patient to take this medication.     Acute urinary retention -  Producing urine    Leukocytosis - etiology uncertain suspect it is stress related from principle problem. - will reassess next am, order placed.   DVT prophylaxis: warfarin with INR supratherapeutic Code Status: dnr Family Communication: None at bedside.  Disposition Plan: pending improvement in  condition   Consultants:   none   Procedures: none   Antimicrobials: none   Subjective: Pt is more alert today. No new complaints currently, just that she is sleepy.   Objective: Vitals:   07/13/17 2023 07/14/17 0520 07/14/17 1017 07/14/17 1255  BP: (!) 106/33 (!) 130/42 (!) 136/52 (!) 121/54  Pulse: 80 70 72 83  Resp: 16 16  16   Temp: 97.8 F (36.6 C) 97.8 F (36.6 C)  98.4 F (36.9 C)  TempSrc: Oral Oral  Axillary  SpO2: 100% 98%  99%  Weight:      Height:        Intake/Output Summary (Last 24 hours) at 07/14/2017 1444 Last data filed at 07/14/2017 1033 Gross per 24 hour  Intake 1680 ml  Output 1180 ml  Net 500 ml    Examination:  General exam: Pt alert and awake, in nad. Respiratory system: Clear to auscultation. Respiratory effort normal. Equal chest rise.  Cardiovascular system: S1 & S2 heard, No JVD, murmurs, rubs, gallops  Gastrointestinal system: Abdomen is nondistended, soft and nontender. No organomegaly or masses felt. Normal bowel sounds heard. Central nervous system: Alert and oriented. No focal neurological deficits. Extremities: Warm, + pulses Skin: No rashes, lesions, or ulcers on limited exam. Psychiatry:  Mood & affect appropriate.   Data Reviewed: I have personally reviewed following labs and imaging studies  CBC: Recent Labs  Lab 07/11/17 1745 07/12/17 0515  WBC 16.4* 16.7*  NEUTROABS 14.4*  --   HGB 11.0* 9.0*  HCT 32.9* 26.6*  MCV 79.7 79.4  PLT 414* 341   Basic Metabolic Panel: Recent Labs  Lab 07/11/17 1745 07/12/17 0515  NA 130* 126*  K 4.3 4.5  CL 98* 97*  CO2 20* 19*  GLUCOSE 124* 139*  BUN 23* 21*  CREATININE 0.74 0.78  CALCIUM 8.7* 8.2*   GFR: Estimated Creatinine Clearance: 58.3 mL/min (by C-G formula based on SCr of 0.78 mg/dL). Liver Function Tests: No results for input(s): AST, ALT, ALKPHOS, BILITOT, PROT, ALBUMIN in the last 168 hours. No results for input(s): LIPASE, AMYLASE in the last 168 hours. No  results for input(s): AMMONIA in the last 168 hours. Coagulation Profile: Recent Labs  Lab 07/11/17 1745 07/12/17 0515 07/13/17 0543 07/14/17 0520  INR >10.00* >10.00* >10.00* 6.77*   Cardiac Enzymes: No results for input(s): CKTOTAL, CKMB, CKMBINDEX, TROPONINI in the last 168 hours. BNP (last 3 results) No results for input(s): PROBNP in the last 8760 hours. HbA1C: No results for input(s): HGBA1C in the last 72 hours. CBG: Recent Labs  Lab 07/13/17 0511  GLUCAP 129*   Lipid Profile: No results for input(s): CHOL, HDL, LDLCALC, TRIG, CHOLHDL, LDLDIRECT in the last 72 hours. Thyroid Function Tests: No results for input(s): TSH, T4TOTAL, FREET4, T3FREE, THYROIDAB in the last 72 hours. Anemia Panel: No results for input(s): VITAMINB12, FOLATE, FERRITIN, TIBC, IRON, RETICCTPCT in the last 72 hours. Sepsis Labs: No results for input(s): PROCALCITON, LATICACIDVEN in the last 168 hours.  No results found for this or any previous visit (from the past 240 hour(s)).   Radiology Studies: No results found.  Scheduled Meds: . atorvastatin  10 mg Oral QHS  . LORazepam  0.5 mg Intravenous Once  . losartan  100 mg Oral Daily  . metoprolol succinate  50 mg Oral Daily  . predniSONE  60 mg Oral Q breakfast  . Warfarin - Pharmacist Dosing Inpatient   Does not apply q1800   Continuous Infusions: . sodium chloride 75 mL/hr at 07/14/17 1337     LOS: 2 days    Time spent: > 35 minutes  Velvet Bathe, MD [facility] Pager [phone]  If 7PM-7AM, please contact night-coverage [email or web address] Password [facility] 07/14/2017, 2:44 PM

## 2017-07-15 ENCOUNTER — Ambulatory Visit: Payer: Medicare Other | Admitting: Internal Medicine

## 2017-07-15 ENCOUNTER — Encounter (HOSPITAL_COMMUNITY): Payer: Self-pay | Admitting: Radiology

## 2017-07-15 ENCOUNTER — Inpatient Hospital Stay (HOSPITAL_COMMUNITY): Payer: Medicare Other

## 2017-07-15 DIAGNOSIS — Z952 Presence of prosthetic heart valve: Secondary | ICD-10-CM

## 2017-07-15 DIAGNOSIS — G9340 Encephalopathy, unspecified: Secondary | ICD-10-CM

## 2017-07-15 DIAGNOSIS — I480 Paroxysmal atrial fibrillation: Secondary | ICD-10-CM

## 2017-07-15 DIAGNOSIS — D62 Acute posthemorrhagic anemia: Secondary | ICD-10-CM

## 2017-07-15 LAB — HEPATIC FUNCTION PANEL
ALT: 53 U/L (ref 14–54)
AST: 44 U/L — ABNORMAL HIGH (ref 15–41)
Albumin: 3.3 g/dL — ABNORMAL LOW (ref 3.5–5.0)
Alkaline Phosphatase: 49 U/L (ref 38–126)
BILIRUBIN DIRECT: 0.8 mg/dL — AB (ref 0.1–0.5)
BILIRUBIN INDIRECT: 1.7 mg/dL — AB (ref 0.3–0.9)
TOTAL PROTEIN: 5.7 g/dL — AB (ref 6.5–8.1)
Total Bilirubin: 2.5 mg/dL — ABNORMAL HIGH (ref 0.3–1.2)

## 2017-07-15 LAB — PREPARE RBC (CROSSMATCH)

## 2017-07-15 LAB — BASIC METABOLIC PANEL
ANION GAP: 8 (ref 5–15)
BUN: 38 mg/dL — ABNORMAL HIGH (ref 6–20)
CALCIUM: 7.8 mg/dL — AB (ref 8.9–10.3)
CO2: 21 mmol/L — ABNORMAL LOW (ref 22–32)
Chloride: 103 mmol/L (ref 101–111)
Creatinine, Ser: 0.8 mg/dL (ref 0.44–1.00)
GFR calc Af Amer: >60 mL/min (ref >60)
GLUCOSE: 129 mg/dL — AB (ref 65–99)
POTASSIUM: 4.2 mmol/L (ref 3.5–5.1)
SODIUM: 132 mmol/L — AB (ref 135–145)

## 2017-07-15 LAB — CBC
HCT: 14 % — ABNORMAL LOW (ref 36.0–46.0)
Hemoglobin: 4.7 g/dL — CL (ref 12.0–15.0)
MCH: 27.8 pg (ref 26.0–34.0)
MCHC: 33.6 g/dL (ref 30.0–36.0)
MCV: 82.8 fL (ref 78.0–100.0)
PLATELETS: 362 10*3/uL (ref 150–400)
RBC: 1.69 MIL/uL — ABNORMAL LOW (ref 3.87–5.11)
RDW: 17 % — AB (ref 11.5–15.5)
WBC: 22.8 10*3/uL — AB (ref 4.0–10.5)

## 2017-07-15 LAB — HEMOGLOBIN AND HEMATOCRIT, BLOOD
HEMATOCRIT: 24.6 % — AB (ref 36.0–46.0)
HEMOGLOBIN: 8.2 g/dL — AB (ref 12.0–15.0)

## 2017-07-15 LAB — PROTIME-INR
INR: 2.93
PROTHROMBIN TIME: 30.4 s — AB (ref 11.4–15.2)

## 2017-07-15 MED ORDER — SODIUM CHLORIDE 0.9 % IV SOLN
Freq: Once | INTRAVENOUS | Status: AC
  Administered 2017-07-15: 11:00:00 via INTRAVENOUS

## 2017-07-15 MED ORDER — WARFARIN SODIUM 1 MG PO TABS
1.0000 mg | ORAL_TABLET | Freq: Once | ORAL | Status: AC
  Administered 2017-07-15: 1 mg via ORAL
  Filled 2017-07-15: qty 1

## 2017-07-15 MED ORDER — MORPHINE SULFATE (PF) 4 MG/ML IV SOLN
2.0000 mg | INTRAVENOUS | Status: DC | PRN
  Administered 2017-07-15 – 2017-07-17 (×2): 2 mg via INTRAVENOUS
  Filled 2017-07-15 (×3): qty 1

## 2017-07-15 MED ORDER — POLYETHYLENE GLYCOL 3350 17 G PO PACK
17.0000 g | PACK | Freq: Every day | ORAL | Status: DC
  Administered 2017-07-16 – 2017-07-17 (×2): 17 g via ORAL
  Filled 2017-07-15 (×3): qty 1

## 2017-07-15 MED ORDER — SENNOSIDES-DOCUSATE SODIUM 8.6-50 MG PO TABS
1.0000 | ORAL_TABLET | Freq: Two times a day (BID) | ORAL | Status: DC
  Administered 2017-07-15 – 2017-07-22 (×11): 1 via ORAL
  Filled 2017-07-15 (×14): qty 1

## 2017-07-15 MED ORDER — WARFARIN - PHARMACIST DOSING INPATIENT: Freq: Every day | Status: DC

## 2017-07-15 MED ORDER — TAMSULOSIN HCL 0.4 MG PO CAPS
0.4000 mg | ORAL_CAPSULE | Freq: Every day | ORAL | Status: DC
  Administered 2017-07-15 – 2017-07-22 (×8): 0.4 mg via ORAL
  Filled 2017-07-15 (×8): qty 1

## 2017-07-15 MED ORDER — GABAPENTIN 100 MG PO CAPS
100.0000 mg | ORAL_CAPSULE | Freq: Every day | ORAL | Status: DC
  Administered 2017-07-15: 100 mg via ORAL
  Filled 2017-07-15: qty 1

## 2017-07-15 MED ORDER — HALOPERIDOL LACTATE 5 MG/ML IJ SOLN
1.0000 mg | Freq: Once | INTRAMUSCULAR | Status: AC
  Administered 2017-07-16: 1 mg via INTRAVENOUS
  Filled 2017-07-15: qty 1

## 2017-07-15 NOTE — Progress Notes (Signed)
CRITICAL VALUE ALERT  Critical Value:  Hgb 4.7  Date & Time Notied:  07/15/17 1735  Provider Notified: Dr. Florencia Reasons  Orders Received/Actions taken: awaiting page back

## 2017-07-15 NOTE — Progress Notes (Signed)
In and out cath performed 8 hours ago. Pt unable to void. Bladder scan showed 457 mL of urine. In and out cath performed with 700 mL of urine returned.

## 2017-07-15 NOTE — Progress Notes (Signed)
PROGRESS NOTE  Jodi Frank [id number]:[id number] DOB: 1940/07/13 DOA: 07/11/2017 PCP: Gildardo Cranker, DO  HPI/Recap of past 24 hours:   Assessment/Plan: Active Problems:   Right sided sciatica   Supratherapeutic INR   Acute urinary retention   Leukocytosis   Sciatica  Acute blood loss anemia: -hgb 4.7 dropped from 9 from three days ago -No blood in the urine, no bm, no hemoptysis or hemetemesis -Ct ab /pel from 2/16 no intraabdomen bleed -She Does has large bruised in right thigh, right arm, will get ct right thigh -coumadin dose reduced -prbc transfusionx2  Right sided sciatica/right leg pain - Pt on prednisone - Added gabapentin but patient became hypersomnolent. She is more alert today, will add back small dose of gabapentin -continue prn percocet -ct right thigh no acute findings.  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 Disc bulges at L4-5 and L5-S1." on ct ab/pel  Leukocytosis, from steroids? No fever. Continue to monitor  Metabolic Encephalopathy - Most likely secondary to medications (flexeril, tramadol, percocet, gabapentin)  -d/c flexeril and tramadol.  Adequate pain control, Avoid constipation, foley placed for urinary retention -more alert    Supratherapeutic INR - INR trending down. Given history of mechanical heart valve will not administer more Vitamin K and monitor. (INR 2.5-3.5 range)  PAF - Supratherapeutic INR  Currently being held. S/p given Vitamin K x 2 (on different days) - she is in afib in the hospital, Rate controlled on B blocker.  -Pt was refusing to take medication but after redirecting we were able to get patient to take this medication.     Acute urinary retention -  start flomax, insert foley    Code Status: DNR  Family Communication: patient   Disposition Plan: SNF once medically stable   Consultants:  none  Procedures:  Foley  placement  Antibiotics:  none   Objective: BP (!) 144/99 (BP Location: Right Arm)   Pulse 80   Temp 97.8 F (36.6 C) (Oral)   Resp 18   Ht 5\' 3"  (1.6 m)   Wt 73.4 kg (161 lb 13.1 oz)   SpO2 97%   BMI 28.66 kg/m   Intake/Output Summary (Last 24 hours) at 07/15/2017 3810 Last data filed at 07/15/2017 0600 Gross per 24 hour  Intake 2040 ml  Output 1880 ml  Net 160 ml   Filed Weights   07/13/17 1751  Weight: 73.4 kg (161 lb 13.1 oz)    Exam: Patient is examined daily including today on 07/15/2017, exams remain the same as of yesterday except that has changed    General:  Pale, demented elderly, know she is at [facility] long hospital, not oriented to time,   Cardiovascular: RRR  Respiratory: CTABL  Abdomen: Soft/ND/NT, positive BS  Musculoskeletal: No Edema  Neuro: lethargic, demented elderly, know she is at [facility] long hospital, not oriented to time,  Skin: large bruises right thigh, right arm  Data Reviewed: Basic Metabolic Panel: Recent Labs  Lab 07/11/17 1745 07/12/17 0515  NA 130* 126*  K 4.3 4.5  CL 98* 97*  CO2 20* 19*  GLUCOSE 124* 139*  BUN 23* 21*  CREATININE 0.74 0.78  CALCIUM 8.7* 8.2*   Liver Function Tests: No results for input(s): AST, ALT, ALKPHOS, BILITOT, PROT, ALBUMIN in the last 168 hours. No results for input(s): LIPASE, AMYLASE in the last 168 hours. No results for input(s): AMMONIA in the last 168 hours. CBC: Recent Labs  Lab 07/11/17 1745  07/12/17 0515 07/15/17 0652  WBC 16.4* 16.7* 22.8*  NEUTROABS 14.4*  --   --   HGB 11.0* 9.0* 4.7*  HCT 32.9* 26.6* 14.0*  MCV 79.7 79.4 82.8  PLT 414* 358 362   Cardiac Enzymes:   No results for input(s): CKTOTAL, CKMB, CKMBINDEX, TROPONINI in the last 168 hours. BNP (last 3 results) No results for input(s): BNP in the last 8760 hours.  ProBNP (last 3 results) No results for input(s): PROBNP in the last 8760 hours.  CBG: Recent Labs  Lab 07/13/17 0511  GLUCAP 129*    No  results found for this or any previous visit (from the past 240 hour(s)).   Studies: No results found.  Scheduled Meds: . atorvastatin  10 mg Oral QHS  . LORazepam  0.5 mg Intravenous Once  . losartan  100 mg Oral Daily  . metoprolol succinate  50 mg Oral Daily  . predniSONE  60 mg Oral Q breakfast    Continuous Infusions: . sodium chloride 75 mL/hr at 07/15/17 0701  . sodium chloride       Time spent: 35 mins I have personally reviewed and interpreted on  07/15/2017 daily labs, tele strips, imagings as discussed above under date review session and assessment and plans.  I reviewed all nursing notes, pharmacy notes, consultant notes,  vitals, pertinent old records  I have discussed plan of care as described above with RN , patient  on 07/15/2017   Florencia Reasons MD, PhD  [facility] Pager [phone]. If 7PM-7AM, please contact night-coverage at [email or web address], password [facility] 07/15/2017, 7:37 AM  LOS: 3 days

## 2017-07-15 NOTE — Progress Notes (Signed)
[place] for warfarin Indication: atrial fibrillation, mitral valve replacement  Allergies  Allergen Reactions  . Carvedilol Itching  . Ace Inhibitors Other (See Comments)    Reaction (??)  . Amlodipine Swelling  . Hydrochlorothiazide Other (See Comments)    Hyponatremia     Patient Measurements: Height: 5\' 3"  (160 cm) Weight: 161 lb 13.1 oz (73.4 kg) IBW/kg (Calculated) : 52.4  Vital Signs: Temp: 97.9 F (36.6 C) (02/20 1057) Temp Source: Oral (02/20 1057) BP: 136/66 (02/20 1057) Pulse Rate: 87 (02/20 1057)  Labs: Recent Labs    07/13/17 0543 07/14/17 0520 07/15/17 0652 07/15/17 1007  HGB  --   --  4.7*  --   HCT  --   --  14.0*  --   PLT  --   --  362  --   LABPROT >90.0* 58.3* 30.4*  --   INR >10.00* 6.77* 2.93  --   CREATININE  --   --   --  0.80    Estimated Creatinine Clearance: 58.3 mL/min (by C-G formula based on SCr of 0.8 mg/dL).   Medical History: Past Medical History:  Diagnosis Date  . Broken hip ([place]) 10/2011   left  . Broken wrist   . Depression   . Diarrhea   . H/O long-term (current) use of anticoagulants   . High cholesterol   . Hypertension   . Memory difficulty 01/18/2016  . 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 Osteoporosis   . SIADH (syndrome of inappropriate ADH production) ([address])   . Vertigo 01/18/2016   Home warfarin: 5mg  daily, last dose 2/15  Assessment: [name] present on 2/16 with R-sided sciatica.  She is on chronic warfarin therapy for atrial fibrillation and mitral valve replacement.  Her INR was >10 at time of presentation to ED. Pharmacy consulted to manage warfarin Significant events: 1/7: INR was therapeutic (2.5) on 5 mg daily 2/4: INR was supratherapeutic (6.9). Was instructed to hold x2 days and resume 5 mg daily 2/14: Presented to the ED for back pain; given 8 mg dexamethasone which could have contributed to her high INR at this admission 2/16: Admitted  with INR >10; given 10 mg vit K PO 2/18: INR remained elevated; given another 10 mg dose of vit K PO  Today, 07/15/2017  INR currently therapeutic (2.93)  CBC: Hgb dropped to 4.7, no hematuria, BM or hematemesis; has a large bruise on right thigh and arm, CT scan showed no significant abnormality -- 2 units of RBC ordered. Platelets WNL  Drug interactions: Corticosteroids can potentiate warfarin's effects (as well as increase risk of bleeding); PO vitamin K may suppress INR for several days  Eating about 25% of meals  Goal of Therapy:  INR 2.5 - 3.5 (per warfarin clinic notes)   Plan:   Warfarin 1 mg PO x1 tonight at 1800 (low dose to prevent subtherapeutic INR)  Daily INR  Monitor for bleeding, f/u CBC in AM  Cianni Manny 07/15/2017 11:09 AM

## 2017-07-15 NOTE — Plan of Care (Signed)
  Health Behavior/Discharge Planning: Ability to manage health-related needs will improve 07/15/2017 2053 - Progressing by Ashley Murrain, RN   Clinical Measurements: Ability to maintain clinical measurements within normal limits will improve 07/15/2017 2053 - Progressing by Ashley Murrain, RN   Clinical Measurements: Diagnostic test results will improve 07/15/2017 2053 - Progressing by Ashley Murrain, RN

## 2017-07-16 LAB — COMPREHENSIVE METABOLIC PANEL
ALT: 51 U/L (ref 14–54)
ANION GAP: 8 (ref 5–15)
AST: 38 U/L (ref 15–41)
Albumin: 3.3 g/dL — ABNORMAL LOW (ref 3.5–5.0)
Alkaline Phosphatase: 50 U/L (ref 38–126)
BILIRUBIN TOTAL: 2.9 mg/dL — AB (ref 0.3–1.2)
BUN: 22 mg/dL — AB (ref 6–20)
CHLORIDE: 103 mmol/L (ref 101–111)
CO2: 22 mmol/L (ref 22–32)
Calcium: 8.2 mg/dL — ABNORMAL LOW (ref 8.9–10.3)
Creatinine, Ser: 0.68 mg/dL (ref 0.44–1.00)
GFR calc Af Amer: >60 mL/min (ref >60)
GFR calc non Af Amer: >60 mL/min (ref >60)
Glucose, Bld: 111 mg/dL — ABNORMAL HIGH (ref 65–99)
POTASSIUM: 4.5 mmol/L (ref 3.5–5.1)
Sodium: 133 mmol/L — ABNORMAL LOW (ref 135–145)
TOTAL PROTEIN: 5.8 g/dL — AB (ref 6.5–8.1)

## 2017-07-16 LAB — CBC WITH DIFFERENTIAL/PLATELET
BASOS PCT: 0 %
Basophils Absolute: 0 10*3/uL (ref 0.0–0.1)
EOS ABS: 0 10*3/uL (ref 0.0–0.7)
Eosinophils Relative: 0 %
HEMATOCRIT: 24.9 % — AB (ref 36.0–46.0)
Hemoglobin: 8.4 g/dL — ABNORMAL LOW (ref 12.0–15.0)
LYMPHS ABS: 2.6 10*3/uL (ref 0.7–4.0)
Lymphocytes Relative: 12 %
MCH: 28.9 pg (ref 26.0–34.0)
MCHC: 33.7 g/dL (ref 30.0–36.0)
MCV: 85.6 fL (ref 78.0–100.0)
MONO ABS: 2.1 10*3/uL — AB (ref 0.1–1.0)
Monocytes Relative: 10 %
NRBC: 6 /100{WBCs} — AB
Neutro Abs: 16.6 10*3/uL — ABNORMAL HIGH (ref 1.7–7.7)
Neutrophils Relative %: 78 %
PLATELETS: 324 10*3/uL (ref 150–400)
RBC: 2.91 MIL/uL — ABNORMAL LOW (ref 3.87–5.11)
RDW: 16.2 % — AB (ref 11.5–15.5)
WBC: 21.3 10*3/uL — ABNORMAL HIGH (ref 4.0–10.5)

## 2017-07-16 LAB — TYPE AND SCREEN
ABO/RH(D): O POS
Antibody Screen: NEGATIVE
UNIT DIVISION: 0
Unit division: 0

## 2017-07-16 LAB — BPAM RBC
BLOOD PRODUCT EXPIRATION DATE: 201903212359
Blood Product Expiration Date: 201903212359
ISSUE DATE / TIME: 201902201314
ISSUE DATE / TIME: 201902201026
UNIT TYPE AND RH: 5100
Unit Type and Rh: 5100

## 2017-07-16 LAB — AMMONIA: AMMONIA: 31 umol/L (ref 9–35)

## 2017-07-16 LAB — PROTIME-INR
INR: 2.63
PROTHROMBIN TIME: 27.9 s — AB (ref 11.4–15.2)

## 2017-07-16 LAB — LACTIC ACID, PLASMA: Lactic Acid, Venous: 1.1 mmol/L (ref 0.5–1.9)

## 2017-07-16 MED ORDER — HEPARIN (PORCINE) IN NACL 100-0.45 UNIT/ML-% IJ SOLN
950.0000 [IU]/h | INTRAMUSCULAR | Status: DC
  Administered 2017-07-16: 950 [IU]/h via INTRAVENOUS
  Filled 2017-07-16: qty 250

## 2017-07-16 MED ORDER — GABAPENTIN 100 MG PO CAPS
100.0000 mg | ORAL_CAPSULE | Freq: Two times a day (BID) | ORAL | Status: DC
  Administered 2017-07-16 – 2017-07-18 (×4): 100 mg via ORAL
  Filled 2017-07-16 (×4): qty 1

## 2017-07-16 MED ORDER — WARFARIN SODIUM 4 MG PO TABS
4.0000 mg | ORAL_TABLET | Freq: Once | ORAL | Status: DC
  Filled 2017-07-16: qty 1

## 2017-07-16 NOTE — Progress Notes (Signed)
Pt daughter, [name], called. Would like MD to call and update. Updated MD

## 2017-07-16 NOTE — Progress Notes (Signed)
[place] for warfarin Indication: atrial fibrillation, mitral valve replacement  Allergies  Allergen Reactions  . Carvedilol Itching  . Ace Inhibitors Other (See Comments)    Reaction (??)  . Amlodipine Swelling  . Hydrochlorothiazide Other (See Comments)    Hyponatremia     Patient Measurements: Height: 5\' 3"  (160 cm) Weight: 161 lb 13.1 oz (73.4 kg) IBW/kg (Calculated) : 52.4  Vital Signs: Temp: 98.3 F (36.8 C) (02/21 0635) Temp Source: Oral (02/21 0635) BP: 176/69 (02/21 0635) Pulse Rate: 75 (02/21 0635)  Labs: Recent Labs    07/14/17 0520  07/15/17 4854 07/15/17 1007 07/15/17 1726 07/16/17 0520  HGB  --    < > 4.7*  --  8.2* 8.4*  HCT  --   --  14.0*  --  24.6* 24.9*  PLT  --   --  362  --   --  324  LABPROT 58.3*  --  30.4*  --   --  27.9*  INR 6.77*  --  2.93  --   --  2.63  CREATININE  --   --   --  0.80  --  0.68   < > = values in this interval not displayed.    Estimated Creatinine Clearance: 58.3 mL/min (by C-G formula based on SCr of 0.68 mg/dL).   Medical History: Past Medical History:  Diagnosis Date  . Broken hip ([place]) 10/2011   left  . Broken wrist   . Depression   . Diarrhea   . H/O long-term (current) use of anticoagulants   . High cholesterol   . Hypertension   . Memory difficulty 01/18/2016  . 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 Osteoporosis   . SIADH (syndrome of inappropriate ADH production) ([place])   . Vertigo 01/18/2016   Home warfarin: 5mg  daily, last dose 2/15  Assessment: [name] present on 2/16 with R-sided sciatica.  She is on chronic warfarin therapy for atrial fibrillation and mechanical mitral valve replacement.  Her INR was >10 at time of presentation to ED. Pharmacy consulted to manage warfarin Significant events: 1/7: INR was therapeutic (2.5) on 5 mg daily 2/4: INR was supratherapeutic (6.9). Was instructed to hold x2 days and resume 5 mg daily 2/14:  Presented to the ED for back pain; given 8 mg dexamethasone which could have contributed to her high INR at this admission 2/16: Admitted with INR >10; given 10 mg vit K PO 2/18: INR remained elevated; given another 10 mg dose of vit K PO  Today, 07/16/2017  INR currently therapeutic (2.63)  CBC: Hgb 8.4 from 4.7 yesterday following 2 units of PRBC. No hematuria, BM or hematemesis; has a large bruise on right thigh and arm, CT scan showed no significant abnormality. Platelets WNL  Drug interactions: Corticosteroids can potentiate warfarin's effects (as well as increase risk of bleeding); PO vitamin K may suppress INR for several days  Beginning to eat meals - 60-85% of meals yesterday.  Goal of Therapy:  INR 2.5 - 3.5 (per warfarin clinic notes)   Plan:   Warfarin 4 mg today. INR trending down following Vitamin K administrations.   Daily INR  Monitor for bleeding, f/u CBC in AM  Hershal Coria, PharmD, BCPS Pager: [phone] 07/16/2017 8:19 AM

## 2017-07-16 NOTE — Progress Notes (Signed)
IR/Radiology team aware of request for [facility] for back pain/sciatica. Imaging reviewed with Dr. Barbie Banner. Amenable to L4-L5 ESI  Tentatively can be done in [place] on Monday 2/25 D/w attending. Plan to hold Coumadin and bridge with heparin drip for mechanical heart valve. If INR 1.5 or less on Mon morning, radiology team will coordinate timing to hold heparin gtt prior to procedure.   Ascencion Dike PA-C Interventional Radiology 07/16/2017 4:23 PM

## 2017-07-16 NOTE — Progress Notes (Signed)
Physical Therapy Treatment Patient Details Name: Jodi Frank MRN: [id number] DOB: 1941/01/26 Today's Date: 07/16/2017    History of Present Illness 77 yo female admitted with R side sciatica. MRI (+) disc bulge L4-L5, L5-S1, spinal canal stenosis, possible L5 nerve root. Hx of fall, A fib, subgaleal hematoma    PT Comments    Pt's mobility remains significantly limited by pain. She also c/o sensation deficits in R LE. She was able to stand for very brief periods. She is still unable to take any pivotal or ambulatory steps. Continue to recommend SNF.     Follow Up Recommendations  SNF     Equipment Recommendations  None recommended by PT    Recommendations for Other Services       Precautions / Restrictions Precautions Precautions: Fall Restrictions Weight Bearing Restrictions: No    Mobility  Bed Mobility Overal bed mobility: Needs Assistance Bed Mobility: Supine to Sit;Sit to Sidelying     Supine to sit: Mod assist;+2 for physical assistance;+2 for safety/equipment;HOB elevated   Sit to sidelying: Mod assist;+2 for physical assistance;+2 for safety/equipment General bed mobility comments: Assist for trunk and bil LEs. Increased time. Max encouragement for participation.   Transfers Overall transfer level: Needs assistance Equipment used: Rolling walker (2 wheeled) Transfers: Sit to/from Stand Sit to Stand: Mod assist;+2 physical assistance;+2 safety/equipment;From elevated surface         General transfer comment: x 2. Pt was able to fully stand on today however she could only tolerate standing for ~5-10 seconds. Significantly limited by pain. Pt also c/o R LE feeling numb  Ambulation/Gait             General Gait Details: Attempted side step x 2. Pt was unable to tolerate enough WBing on R LE to attempt any steps   Stairs            Wheelchair Mobility    Modified Rankin (Stroke Patients Only)       Balance Overall balance assessment:  Needs assistance   Sitting balance-Leahy Scale: Fair                                      Cognition Arousal/Alertness: Awake/alert Behavior During Therapy: WFL for tasks assessed/performed Overall Cognitive Status: No family/caregiver present to determine baseline cognitive functioning                                 General Comments: some memory issues      Exercises      General Comments        Pertinent Vitals/Pain Pain Assessment: Faces Faces Pain Scale: Hurts worst Pain Location: R leg, back Pain Descriptors / Indicators: Moaning;Grimacing;Guarding;Crying;Numbness Pain Intervention(s): Limited activity within patient's tolerance;Repositioned;Premedicated before session    Home Living                      Prior Function            PT Goals (current goals can now be found in the care plan section) Progress towards PT goals: Progressing toward goals(very slowly. Limited by pain)    Frequency    Min 2X/week      PT Plan Current plan remains appropriate    Co-evaluation              AM-PAC PT "6 Clicks"  Daily Activity  Outcome Measure  Difficulty turning over in bed (including adjusting bedclothes, sheets and blankets)?: Unable Difficulty moving from lying on back to sitting on the side of the bed? : Unable Difficulty sitting down on and standing up from a chair with arms (e.g., wheelchair, bedside commode, etc,.)?: Unable Help needed moving to and from a bed to chair (including a wheelchair)?: Total Help needed walking in hospital room?: Total Help needed climbing 3-5 steps with a railing? : Total 6 Click Score: 6    End of Session   Activity Tolerance: Patient limited by pain Patient left: in bed;with call bell/phone within reach;with bed alarm set   PT Visit Diagnosis: Pain;Other abnormalities of gait and mobility (R26.89);Difficulty in walking, not elsewhere classified (R26.2) Pain - Right/Left:  Right Pain - part of body: Leg     Time: 1610-9604 PT Time Calculation (min) (ACUTE ONLY): 12 min  Charges:  $Therapeutic Activity: 8-22 mins                    G Codes:          Weston Anna, MPT Pager: [phone]

## 2017-07-16 NOTE — Progress Notes (Signed)
PROGRESS NOTE  EVI MCCOMB LZJ:[id number] DOB: 10-30-1940 DOA: 07/11/2017 PCP: Gildardo Cranker, DO  HPI/Recap of past 24 hours:  Continue to complain about back pain , pain radiation down to right leg, report right leg feeling numb  Assessment/Plan: Active Problems:   Right sided sciatica   Supratherapeutic INR   Acute urinary retention   Leukocytosis   Sciatica  Acute blood loss anemia: -hgb 4.7 dropped from 9 from three days ago -No blood in the urine, no bm, no hemoptysis or hemetemesis -Ct ab /pel from 2/16 no intraabdomen bleed -She Does has large bruised in right thigh, right arm, will get ct right thigh -coumadin dose reduced -prbc transfusionx2, hgb has been stable post transfusion  Right sided sciatica/right leg pain --"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 Disc bulges at L4-5 and L5-S1." on ct ab/pel -Mri spine : "Mild multilevel degenerative changes throughout the lumbar spine as described above, worst at L4-L5 where there is mild central spinal canal stenosis and possible irritation of the descending right L5 nerve root. -she is stared on on prednisone on admission - patient became hypersomnolent though due to adding neurontin, neurontin held by my colleague,  She is more alert today, start to add  Back  Gabapentin due to continue to c/o right leg numbness -continue prn percocet -ct right thigh no acute findings.  -Due to persistent symptom with back pain and right lower extremity pain and numbness, case discussed with interventional radiology who plan to proceed ESI on Monday once INR less than 1.5 Hold Coumadin, bridged with heparin drip -Plan discussed with patient in room and daughter all over the phone as well  Leukocytosis, from steroids? No fever. Continue to monitor  Metabolic Encephalopathy - Most likely secondary to medications (flexeril, tramadol, percocet, gabapentin)  -d/c  flexeril and tramadol.  - pain control, Avoid constipation, foley placed for urinary retention -more alert    Supratherapeutic INR -INR 10 on presentation, suspect patient cannot manage her own medications at home due to progressive memory issues, I have discussed with daughter over the phone who agrees nursing home placement - INR trending down. Given history of mechanical heart valve will not administer more Vitamin K and monitor. (INR 2.5-3.5 range)  PAF - Supratherapeutic INR  Currently being held. S/p given Vitamin K x 2 (on different days) - she is in afib in the hospital, Rate controlled on B blocker.  -Pt was refusing to take medication but after redirecting we were able to get patient to take this medication.     Acute urinary retention -  start flomax, insert foley, avoid constipation  Hypertension/upper lipidemia : Continue metoprolol, Cozaar, statin  History of CVA, concern about vascular dementia, case discussed with daughter recommend outpatient neurology evaluation once acute issue resolves  Code Status: DNR  Family Communication: patient in room, daughter over the phone on 2/21  Disposition Plan: SNF once medically stable   Consultants:  IR  Procedures:  Foley placement  ESI planned on Monday  Antibiotics:  none   Objective: BP 131/68 (BP Location: Right Arm)   Pulse 94   Temp 98.2 F (36.8 C) (Oral)   Resp 18   Ht 5\' 3"  (1.6 m)   Wt 73.4 kg (161 lb 13.1 oz)   SpO2 94%   BMI 28.66 kg/m   Intake/Output Summary (Last 24 hours) at 07/16/2017 1830 Last data filed at 07/16/2017 1300 Gross per 24 hour  Intake 678.75 ml  Output 1400 ml  Net -721.25 ml   Filed Weights   07/13/17 9833  Weight: 73.4 kg (161 lb 13.1 oz)    Exam: Patient is examined daily including today on 07/16/2017, exams remain the same as of yesterday except that has changed    General:  Pale, demented elderly, know she is at [facility] long hospital, not oriented to time,  she appears drowsy in the morning, more alert as day goes on, she states she is not a morning person  Cardiovascular: RRR  Respiratory: CTABL  Abdomen: Soft/ND/NT, positive BS  Musculoskeletal: No Edema  Neuro: lethargic, demented elderly, know she is at [facility] long hospital, not oriented to time,  Skin: large bruises right thigh, right arm  Data Reviewed: Basic Metabolic Panel: Recent Labs  Lab 07/11/17 1745 07/12/17 0515 07/15/17 1007 07/16/17 0520  NA 130* 126* 132* 133*  K 4.3 4.5 4.2 4.5  CL 98* 97* 103 103  CO2 20* 19* 21* 22  GLUCOSE 124* 139* 129* 111*  BUN 23* 21* 38* 22*  CREATININE 0.74 0.78 0.80 0.68  CALCIUM 8.7* 8.2* 7.8* 8.2*   Liver Function Tests: Recent Labs  Lab 07/15/17 1007 07/16/17 0520  AST 44* 38  ALT 53 51  ALKPHOS 49 50  BILITOT 2.5* 2.9*  PROT 5.7* 5.8*  ALBUMIN 3.3* 3.3*   No results for input(s): LIPASE, AMYLASE in the last 168 hours. Recent Labs  Lab 07/16/17 0519  AMMONIA 31   CBC: Recent Labs  Lab 07/11/17 1745 07/12/17 0515 07/15/17 0652 07/15/17 1726 07/16/17 0520  WBC 16.4* 16.7* 22.8*  --  21.3*  NEUTROABS 14.4*  --   --   --  16.6*  HGB 11.0* 9.0* 4.7* 8.2* 8.4*  HCT 32.9* 26.6* 14.0* 24.6* 24.9*  MCV 79.7 79.4 82.8  --  85.6  PLT 414* 358 362  --  324   Cardiac Enzymes:   No results for input(s): CKTOTAL, CKMB, CKMBINDEX, TROPONINI in the last 168 hours. BNP (last 3 results) No results for input(s): BNP in the last 8760 hours.  ProBNP (last 3 results) No results for input(s): PROBNP in the last 8760 hours.  CBG: Recent Labs  Lab 07/13/17 0511  GLUCAP 129*    No results found for this or any previous visit (from the past 240 hour(s)).   Studies: No results found.  Scheduled Meds: . atorvastatin  10 mg Oral QHS  . gabapentin  100 mg Oral BID  . LORazepam  0.5 mg Intravenous Once  . losartan  100 mg Oral Daily  . metoprolol succinate  50 mg Oral Daily  . polyethylene glycol  17 g Oral Daily    . predniSONE  60 mg Oral Q breakfast  . senna-docusate  1 tablet Oral BID  . tamsulosin  0.4 mg Oral Daily    Continuous Infusions: . heparin 950 Units/hr (07/16/17 1820)     Time spent: 35 mins, case discussed with interventional radiology I have personally reviewed and interpreted on  07/16/2017 daily labs, tele strips, imagings as discussed above under date review session and assessment and plans.  I reviewed all nursing notes, pharmacy notes, consultant notes,  vitals, pertinent old records  I have discussed plan of care as described above with RN , patient,  daughter over the phone on 07/16/2017   Florencia Reasons MD, PhD  [facility] Pager [phone]. If 7PM-7AM, please contact night-coverage at [email or web address], password [facility] 07/16/2017, 6:30 PM  LOS: 4 days

## 2017-07-16 NOTE — Plan of Care (Signed)
Will cont to mon pain

## 2017-07-16 NOTE — Progress Notes (Signed)
Started heparin. Verified drip rate with second [place]

## 2017-07-16 NOTE — Progress Notes (Signed)
ANTICOAGULATION CONSULT NOTE - Follow Up Consult  Pharmacy Consult for Heparin Indication: atrial fibrillation, mechanical mitral valve replacement  Allergies  Allergen Reactions  . Carvedilol Itching  . Ace Inhibitors Other (See Comments)    Reaction (??)  . Amlodipine Swelling  . Hydrochlorothiazide Other (See Comments)    Hyponatremia     Patient Measurements: Height: 5\' 3"  (160 cm) Weight: 161 lb 13.1 oz (73.4 kg) IBW/kg (Calculated) : 52.4  Heparin dosing weight: 68 kg  Vital Signs: Temp: 98.2 F (36.8 C) (02/21 1452) Temp Source: Oral (02/21 1452) BP: 131/68 (02/21 1452) Pulse Rate: 94 (02/21 1452)  Labs: Recent Labs    07/14/17 0520  07/15/17 0652 07/15/17 1007 07/15/17 1726 07/16/17 0520  HGB  --    < > 4.7*  --  8.2* 8.4*  HCT  --   --  14.0*  --  24.6* 24.9*  PLT  --   --  362  --   --  324  LABPROT 58.3*  --  30.4*  --   --  27.9*  INR 6.77*  --  2.93  --   --  2.63  CREATININE  --   --   --  0.80  --  0.68   < > = values in this interval not displayed.    Estimated Creatinine Clearance: 58.3 mL/min (by C-G formula based on SCr of 0.68 mg/dL).   Medications:  Infusions:    Assessment: [name] present on 2/16 with R-sided sciatica.  She is on chronic warfarin therapy for atrial fibrillation and mitral valve replacement.  Her INR was >10 at time of presentation to ED. Pharmacy was consulted to manage warfarin.  Significant events: 1/7: INR was therapeutic (2.5) on 5 mg daily 2/4: INR was supratherapeutic (6.9). Was instructed to hold x2 days and resume 5 mg daily 2/14: Presented to the ED for back pain; given 8 mg dexamethasone which could have contributed to her high INR at this admission 2/16: Admitted with INR >10; given 10 mg vit K PO 2/18: INR remained elevated; given another 10 mg dose of vit K PO 2/21 Warfarin canceled d/t IR procedure plans on Monday 2/25.  Goal INR < 1.5 for procedure.  Pharmacy is consulted to dose Heparin for bridging.   Will begin heparin infusion tonight despite INR > 2.5; expect that INR will decrease to subtherapeutic after 2 recent vitamin K doses and holding warfarin tonight.    Today, 07/16/2017: INR 2.63 (Goal INR 2.5 - 3.5) CBC: Hgb improved and stable at 8.4 s/p transfusion on 2/20.  Plt remains WNL. No bleeding or complications reported.  Pt has a large bruise on right thigh and arm, CT scan 2/20 showed no significant abnormality.  No hematuria, BM or hematemesis.  Goal of Therapy:  Heparin level 0.3-0.7 units/ml Monitor platelets by anticoagulation protocol: Yes   Plan:   No heparin bolus  Start heparin IV infusion at 950 units/hr  Heparin level 8 hours after starting  Daily heparin level and CBC  Monitor for s/s bleeding, thrombosis.   Gretta Arab PharmD, BCPS Pager [phone] 07/16/2017 4:47 PM

## 2017-07-17 DIAGNOSIS — R413 Other amnesia: Secondary | ICD-10-CM

## 2017-07-17 LAB — COMPREHENSIVE METABOLIC PANEL
ALT: 47 U/L (ref 14–54)
AST: 38 U/L (ref 15–41)
Albumin: 3.2 g/dL — ABNORMAL LOW (ref 3.5–5.0)
Alkaline Phosphatase: 57 U/L (ref 38–126)
Anion gap: 9 (ref 5–15)
BUN: 15 mg/dL (ref 6–20)
CO2: 23 mmol/L (ref 22–32)
Calcium: 8.2 mg/dL — ABNORMAL LOW (ref 8.9–10.3)
Chloride: 98 mmol/L — ABNORMAL LOW (ref 101–111)
Creatinine, Ser: 0.59 mg/dL (ref 0.44–1.00)
GFR calc Af Amer: >60 mL/min (ref >60)
GFR calc non Af Amer: >60 mL/min (ref >60)
Glucose, Bld: 110 mg/dL — ABNORMAL HIGH (ref 65–99)
Potassium: 4.4 mmol/L (ref 3.5–5.1)
Sodium: 130 mmol/L — ABNORMAL LOW (ref 135–145)
Total Bilirubin: 3 mg/dL — ABNORMAL HIGH (ref 0.3–1.2)
Total Protein: 5.8 g/dL — ABNORMAL LOW (ref 6.5–8.1)

## 2017-07-17 LAB — CBC
HEMATOCRIT: 25.4 % — AB (ref 36.0–46.0)
HEMOGLOBIN: 8.5 g/dL — AB (ref 12.0–15.0)
MCH: 29.1 pg (ref 26.0–34.0)
MCHC: 33.5 g/dL (ref 30.0–36.0)
MCV: 87 fL (ref 78.0–100.0)
Platelets: 420 10*3/uL — ABNORMAL HIGH (ref 150–400)
RBC: 2.92 MIL/uL — AB (ref 3.87–5.11)
RDW: 16.6 % — ABNORMAL HIGH (ref 11.5–15.5)
WBC: 23.4 10*3/uL — AB (ref 4.0–10.5)

## 2017-07-17 LAB — PROTIME-INR
INR: 3.01
Prothrombin Time: 31 seconds — ABNORMAL HIGH (ref 11.4–15.2)

## 2017-07-17 LAB — MAGNESIUM: Magnesium: 2.1 mg/dL (ref 1.7–2.4)

## 2017-07-17 LAB — HEPARIN LEVEL (UNFRACTIONATED)
Heparin Unfractionated: 0.28 IU/mL — ABNORMAL LOW (ref 0.30–0.70)
Heparin Unfractionated: 0.56 IU/mL (ref 0.30–0.70)

## 2017-07-17 MED ORDER — BISACODYL 10 MG RE SUPP
10.0000 mg | Freq: Every day | RECTAL | Status: AC
  Administered 2017-07-17: 10 mg via RECTAL
  Filled 2017-07-17 (×2): qty 1

## 2017-07-17 MED ORDER — POLYETHYLENE GLYCOL 3350 17 G PO PACK
17.0000 g | PACK | Freq: Two times a day (BID) | ORAL | Status: DC
  Administered 2017-07-17 – 2017-07-21 (×4): 17 g via ORAL
  Filled 2017-07-17 (×9): qty 1

## 2017-07-17 MED ORDER — HEPARIN (PORCINE) IN NACL 100-0.45 UNIT/ML-% IJ SOLN
1050.0000 [IU]/h | INTRAMUSCULAR | Status: DC
  Administered 2017-07-17 – 2017-07-19 (×3): 1050 [IU]/h via INTRAVENOUS
  Filled 2017-07-17 (×3): qty 250

## 2017-07-17 NOTE — Progress Notes (Signed)
ANTICOAGULATION CONSULT NOTE - Follow Up Consult  Pharmacy Consult for Heparin Indication: atrial fibrillation, mechanical mitral valve replacement  See previous progress note from Leodis Sias, PharmD for full details.  In brief, Pharmacy consulted to dose IV heparin while coumadin on hold for procedure on 2/25.  Heparin level therapeutic (0.56) on 1050 units/hr Per discussion with RN, previously reported bruising has not worsened  Plan: - Continue heparin at current rate - Recheck heparin level in 8hrs to verify therapeutic  Peggyann Juba, PharmD, BCPS Pager: [phone] 07/17/2017 4:55 PM

## 2017-07-17 NOTE — Progress Notes (Signed)
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 Disc bulges at L4-5 and L5-S1." on ct ab/pel -Mri spine : "Mild multilevel degenerative changes throughout the lumbar spine as described above, worst at L4-L5 where there is mild central spinal canal stenosis and possible irritation of the descending right L5 nerve root. -she is stared on on prednisone on admission - patient became hypersomnolent though due to adding neurontin, neurontin held by my colleague,  She is more alert today, start to add  Back  Gabapentin due to continue to c/o right leg numbness -continue prn percocet -ct right thigh no acute findings.  -Due to persistent symptom with back pain and right lower extremity pain and numbness, case discussed with interventional radiology who plan to proceed ESI on Monday once INR less than 1.5 Hold Coumadin, bridged with heparin drip -Plan discussed with patient in room and daughter all over the phone  On 2/21  Leukocytosis, from steroids? No fever. Continue to monitor  Acute blood loss anemia: -hgb 4.7 dropped on 2/20 from 9 from three days prior -No blood in the urine, no bm, no hemoptysis or hemetemesis -Ct ab /pel from 2/16 no intraabdomen bleed -She Does has large bruised in right thigh, right arm, will get ct right thigh -coumadin dose reduced -prbc transfusionx2, hgb has been stable post transfusion  Metabolic Encephalopathy -  Most likely secondary to medications (flexeril, tramadol, percocet, gabapentin)  -d/c flexeril and tramadol.  - pain control, Avoid constipation, foley placed for urinary retention -more alert, today is aaox3, continue to have poor memory    Supratherapeutic INR -INR 10 on presentation, suspect patient cannot manage her own medications at home due to progressive memory issues, I have discussed with daughter over the phone who agrees nursing home placement - INR trending down. Given history of mechanical heart valve will not administer more Vitamin K and monitor. (INR 2.5-3.5 range)  PAF - Supratherapeutic INR  Currently being held. S/p given Vitamin K x 2 (on different days) - she is in afib in the hospital, Rate controlled on B blocker.      Acute urinary retention -  start flomax, insert foley, avoid constipation  Hypertension/upper lipidemia : Continue metoprolol, Cozaar, statin  History of CVA,  concern about vascular dementia, case discussed with daughter recommend outpatient neurology evaluation once acute issue resolves  Code Status: DNR  Family Communication: patient in room, daughter over the phone on 2/21  Disposition Plan: SNF once medically stable   Consultants:  IR  Procedures:  Foley placement  ESI planned on Monday  Antibiotics:  none   Objective: BP (!) 147/64 (BP Location: Right Arm)   Pulse 96   Temp 97.7 F (36.5 C) (Oral)   Resp 18   Ht 5\' 3"  (1.6 m)   Wt 73.4 kg (161 lb 13.1 oz)   SpO2 96%   BMI 28.66 kg/m   Intake/Output Summary (Last 24 hours) at 07/17/2017 1619 Last data filed at 07/17/2017 1431  Gross per 24 hour  Intake 240.33 ml  Output 3050 ml  Net -2809.67 ml   Filed Weights   07/13/17 5573  Weight: 73.4 kg (161 lb 13.1 oz)    Exam: Patient is examined daily including today on 07/17/2017, exams remain the same as of yesterday except that has changed    General:  Pale, demented elderly,  more alert this am, she is  aaox3 this am  Cardiovascular: RRR  Respiratory: CTABL  Abdomen: Soft/ND/NT, positive BS  Musculoskeletal: No Edema  Neuro: more alert this am, she is aaox3 this am. Impaired short term memory  Skin: large bruises right thigh, right arm  Data Reviewed: Basic Metabolic Panel: Recent Labs  Lab 07/11/17 1745 07/12/17 0515 07/15/17 1007 07/16/17 0520 07/17/17 0138  NA 130* 126* 132* 133* 130*  K 4.3 4.5 4.2 4.5 4.4  CL 98* 97* 103 103 98*  CO2 20* 19* 21* 22 23  GLUCOSE 124* 139* 129* 111* 110*  BUN 23* 21* 38* 22* 15  CREATININE 0.74 0.78 0.80 0.68 0.59  CALCIUM 8.7* 8.2* 7.8* 8.2* 8.2*  MG  --   --   --   --  2.1   Liver Function Tests: Recent Labs  Lab 07/15/17 1007 07/16/17 0520 07/17/17 0138  AST 44* 38 38  ALT 53 51 47  ALKPHOS 49 50 57  BILITOT 2.5* 2.9* 3.0*  PROT 5.7* 5.8* 5.8*  ALBUMIN 3.3* 3.3* 3.2*   No results for input(s): LIPASE, AMYLASE in the last 168 hours. Recent Labs  Lab 07/16/17 0519  AMMONIA 31   CBC: Recent Labs  Lab 07/11/17 1745 07/12/17 0515 07/15/17 0652 07/15/17 1726 07/16/17 0520 07/17/17 0138  WBC 16.4* 16.7* 22.8*  --  21.3* 23.4*  NEUTROABS 14.4*  --   --   --  16.6*  --   HGB 11.0* 9.0* 4.7* 8.2* 8.4* 8.5*  HCT 32.9* 26.6* 14.0* 24.6* 24.9* 25.4*  MCV 79.7 79.4 82.8  --  85.6 87.0  PLT 414* 358 362  --  324 420*   Cardiac Enzymes:   No results for input(s): CKTOTAL, CKMB, CKMBINDEX, TROPONINI in the last 168 hours. BNP (last 3 results) No results for input(s): BNP in the last 8760 hours.  ProBNP (last 3 results) No results for input(s): PROBNP in the last 8760 hours.  CBG: Recent Labs  Lab 07/13/17 0511  GLUCAP 129*    No results found for this or any previous visit (from the past 240 hour(s)).   Studies: No results found.  Scheduled Meds: . atorvastatin  10 mg Oral QHS  . gabapentin  100 mg Oral BID  . LORazepam  0.5 mg Intravenous Once  . losartan  100 mg Oral Daily  . metoprolol succinate  50  mg Oral Daily  . polyethylene glycol  17 g Oral Daily  . predniSONE  60 mg Oral Q breakfast  . senna-docusate  1 tablet Oral BID  . tamsulosin  0.4 mg Oral Daily    Continuous Infusions: . heparin 1,050 Units/hr (07/17/17 1603)     Time spent: 25 mins,  I have personally reviewed and interpreted on  07/17/2017 daily labs, tele strips, imagings as discussed above under date review session and assessment and plans.  I reviewed all nursing notes, pharmacy notes, consultant notes,  vitals, pertinent old records  I have discussed plan of care as described above with RN , patient on 07/17/2017   Florencia Reasons MD, PhD  [facility] Pager [phone]. If 7PM-7AM,  please contact night-coverage at [email or web address], password [email or web address] 07/17/2017, 4:19 PM  LOS: 5 days

## 2017-07-17 NOTE — Progress Notes (Signed)
ANTICOAGULATION CONSULT NOTE - Follow Up Consult  Pharmacy Consult for Heparin Indication: atrial fibrillation, mechanical mitral valve replacement  Allergies  Allergen Reactions  . Carvedilol Itching  . Ace Inhibitors Other (See Comments)    Reaction (??)  . Amlodipine Swelling  . Hydrochlorothiazide Other (See Comments)    Hyponatremia     Patient Measurements: Height: 5\' 3"  (160 cm) Weight: 161 lb 13.1 oz (73.4 kg) IBW/kg (Calculated) : 52.4  Heparin dosing weight: 68 kg  Vital Signs: Temp: 98.3 F (36.8 C) (02/22 0455) Temp Source: Oral (02/22 0455) BP: 164/63 (02/22 0455) Pulse Rate: 75 (02/22 0455)  Labs: Recent Labs    07/15/17 0652 07/15/17 1007 07/15/17 1726 07/16/17 0520 07/17/17 0138  HGB 4.7*  --  8.2* 8.4* 8.5*  HCT 14.0*  --  24.6* 24.9* 25.4*  PLT 362  --   --  324 420*  LABPROT 30.4*  --   --  27.9* 31.0*  INR 2.93  --   --  2.63 3.01  HEPARINUNFRC  --   --   --   --  0.28*  CREATININE  --  0.80  --  0.68 0.59    Estimated Creatinine Clearance: 58.3 mL/min (by C-G formula based on SCr of 0.59 mg/dL).   Medications:  Infusions:  . heparin 950 Units/hr (07/16/17 1820)    Assessment: [name] present on 2/16 with R-sided sciatica.  She is on chronic warfarin therapy for atrial fibrillation and mitral valve replacement.  Her INR was >10 at time of presentation to ED. Pharmacy was consulted to manage warfarin.  Significant events: 1/7: INR was therapeutic (2.5) on 5 mg daily 2/4: INR was supratherapeutic (6.9). Was instructed to hold x2 days and resume 5 mg daily 2/14: Presented to the ED for back pain; given 8 mg dexamethasone which could have contributed to her high INR at this admission 2/16: Admitted with INR >10; given 10 mg vit K PO 2/18: INR remained elevated; given another 10 mg dose of vit K PO 2/21 Warfarin canceled d/t IR procedure plans on Monday 2/25.  Goal INR < 1.5 for procedure.  Pharmacy is consulted to dose Heparin for  bridging.  Will begin heparin infusion tonight despite INR > 2.5; expect that INR will decrease to subtherapeutic after 2 recent vitamin K doses and holding warfarin tonight.   07/17/2017  Heparin level slightly below goal at 0.28 but INR 3.01 so pt fully anticoagulated INR 3.01(Goal INR 2.5 - 3.5) - coumadin on hold for IR CBC: Hgb at 8.5 s/p transfusion on 2/20.  No bleeding or complications reported.  Pt has a large bruise on right thigh and arm, CT scan 2/20 showed no significant abnormality.  No hematuria, BM or hematemesis.  Goal of Therapy:  Heparin level 0.3-0.7 units/ml Monitor platelets by anticoagulation protocol: Yes   Plan:   No heparin bolus  Increase heparin IV infusion  To 1050 units/hr  Heparin level 8 hours after rate increase  Daily heparin level and CBC  Monitor for s/s bleeding, thrombosis. interventional radiology who plan to proceed ESI on Monday once INR less than 1.5   Eudelia Bunch, Pharm.D. [phone] 07/17/2017 7:34 AM

## 2017-07-17 NOTE — Care Management Important Message (Signed)
Important Message  Patient Details  Name: JODEL MAYHALL MRN: [id number] Date of Birth: 12-03-1940   Medicare Important Message Given:  Yes    Kerin Salen 07/17/2017, 12:15 [facility] Message  Patient Details  Name: AMEYA VOWELL MRN: [id number] Date of Birth: 24-Nov-1940   Medicare Important Message Given:  Yes    Kerin Salen 07/17/2017, 12:15 PM

## 2017-07-18 ENCOUNTER — Inpatient Hospital Stay (HOSPITAL_COMMUNITY): Payer: Medicare Other

## 2017-07-18 LAB — COMPREHENSIVE METABOLIC PANEL
ALBUMIN: 3.7 g/dL (ref 3.5–5.0)
ALT: 43 U/L (ref 14–54)
ANION GAP: 10 (ref 5–15)
AST: 30 U/L (ref 15–41)
Alkaline Phosphatase: 62 U/L (ref 38–126)
BILIRUBIN TOTAL: 3.9 mg/dL — AB (ref 0.3–1.2)
BUN: 15 mg/dL (ref 6–20)
CHLORIDE: 96 mmol/L — AB (ref 101–111)
CO2: 25 mmol/L (ref 22–32)
Calcium: 8.9 mg/dL (ref 8.9–10.3)
Creatinine, Ser: 0.55 mg/dL (ref 0.44–1.00)
GFR calc Af Amer: >60 mL/min (ref >60)
GLUCOSE: 116 mg/dL — AB (ref 65–99)
POTASSIUM: 4.2 mmol/L (ref 3.5–5.1)
Sodium: 131 mmol/L — ABNORMAL LOW (ref 135–145)
TOTAL PROTEIN: 6.4 g/dL — AB (ref 6.5–8.1)

## 2017-07-18 LAB — CBC
HEMATOCRIT: 29.3 % — AB (ref 36.0–46.0)
Hemoglobin: 9.6 g/dL — ABNORMAL LOW (ref 12.0–15.0)
MCH: 28.7 pg (ref 26.0–34.0)
MCHC: 32.8 g/dL (ref 30.0–36.0)
MCV: 87.5 fL (ref 78.0–100.0)
PLATELETS: 352 10*3/uL (ref 150–400)
RBC: 3.35 MIL/uL — ABNORMAL LOW (ref 3.87–5.11)
RDW: 17.8 % — AB (ref 11.5–15.5)
WBC: 25.1 10*3/uL — ABNORMAL HIGH (ref 4.0–10.5)

## 2017-07-18 LAB — HEPARIN LEVEL (UNFRACTIONATED): HEPARIN UNFRACTIONATED: 0.46 [IU]/mL (ref 0.30–0.70)

## 2017-07-18 LAB — PROTIME-INR
INR: 2.12
PROTHROMBIN TIME: 23.6 s — AB (ref 11.4–15.2)

## 2017-07-18 MED ORDER — GABAPENTIN 100 MG PO CAPS
100.0000 mg | ORAL_CAPSULE | Freq: Three times a day (TID) | ORAL | Status: DC
  Administered 2017-07-18 – 2017-07-20 (×5): 100 mg via ORAL
  Filled 2017-07-18 (×6): qty 1

## 2017-07-18 NOTE — Progress Notes (Signed)
Pt refused dulcolax suppos. Educated about constipation with use of percocet. Pt states that she just had a BM 2/22. Continues to refuse.

## 2017-07-18 NOTE — Progress Notes (Signed)
Pt refused to get OOB to chair. Pt states she is too tired and would like to rest. Tried to encourage pt. Still refused.

## 2017-07-18 NOTE — Progress Notes (Signed)
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 Disc bulges at L4-5 and L5-S1." on ct ab/pel -Mri spine : "Mild multilevel degenerative changes throughout the lumbar spine as described above, worst at L4-L5 where there is mild central spinal canal stenosis and possible irritation of the descending right L5 nerve root. -she is stared on on prednisone on admission - patient became hypersomnolent though due to adding neurontin, neurontin held by my colleague,   -She is more alert , start to add  Back  Gabapentin due to continue to c/o right leg numbness, slowly increasing gabapentin dose to 100 mg 3 times daily -continue prn percocet -ct right thigh no acute findings.  -Due to persistent symptom with back pain and right lower extremity pain and numbness, case discussed with interventional radiology who plan to proceed ESI on Monday once INR less than 1.5 Hold Coumadin, bridged with heparin drip -Plan discussed with patient in room and daughter all over the phone  On 2/21 and on 2/23  Leukocytosis, from steroids? No fever. Continue to monitor  Acute blood loss anemia: -hgb 4.7 dropped on 2/20 from 9 from three days prior -No blood in the urine, no bm, no hemoptysis or hemetemesis -Ct ab /pel from 2/16 no intraabdomen bleed -She Does has large bruised in right thigh, right arm, will get ct right thigh -coumadin dose reduced -prbc transfusionx2, hgb has  been stable post transfusion  Metabolic Encephalopathy - Most likely secondary to medications (flexeril, tramadol, percocet, gabapentin)  -d/c flexeril and tramadol.  - pain control, Avoid constipation, foley placed for urinary retention -more alert, today is aaox3, continue to have poor memory -CT had no acute findings    Supratherapeutic INR -INR 10 on presentation, suspect patient cannot manage her own medications at home due to progressive memory issues, I have discussed with daughter over the phone who agrees nursing home placement - INR trending down. Given history of mechanical heart valve will not administer more Vitamin K and monitor. (INR 2.5-3.5 range)  PAF - Supratherapeutic INR  Currently being held. S/p given Vitamin K x 2 (on different days) - she is in afib in the hospital, Rate controlled on B blocker.      Acute urinary retention -  start flomax, insert foley, avoid constipation  Hypertension/upper lipidemia : Continue metoprolol, Cozaar, statin  possible vascular dementia, case discussed with daughter recommend outpatient neurology evaluation once acute issue resolves Daughter denies h/o CVA. 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32 ml   Filed Weights   07/13/17 6283  Weight: 73.4 kg (161 lb 13.1 oz)    Exam: Patient is examined daily  including today on 07/18/2017, exams remain the same as of yesterday except that has changed    General:  Pale, demented elderly,  more alert this am, she is aaox3 this am  Cardiovascular: RRR  Respiratory: CTABL  Abdomen: Soft/ND/NT, positive BS  Musculoskeletal: No Edema  Neuro: more alert this am, she is aaox3 this am. Impaired short term memory  Skin: large bruises right thigh, right arm  Data Reviewed: Basic Metabolic Panel: Recent Labs  Lab 07/12/17 0515 07/15/17 1007 07/16/17 0520 07/17/17 0138 07/18/17 0014  NA 126* 132* 133* 130* 131*  K 4.5 4.2 4.5 4.4 4.2  CL 97* 103 103 98* 96*  CO2 19* 21* 22 23 25   GLUCOSE 139* 129* 111* 110* 116*  BUN 21* 38* 22* 15 15  CREATININE 0.78 0.80 0.68 0.59 0.55  CALCIUM 8.2* 7.8* 8.2* 8.2* 8.9  MG  --   --   --  2.1  --    Liver Function Tests: Recent Labs  Lab 07/15/17 1007 07/16/17 0520 07/17/17 0138 07/18/17 0014  AST 44* 38 38 30  ALT 53 51 47 43  ALKPHOS 49 50 57 62  BILITOT 2.5* 2.9* 3.0* 3.9*  PROT 5.7* 5.8* 5.8* 6.4*  ALBUMIN 3.3* 3.3* 3.2* 3.7   No results for input(s): LIPASE, AMYLASE in the last 168 hours. Recent Labs  Lab 07/16/17 0519  AMMONIA 31   CBC: Recent Labs  Lab 07/11/17 1745 07/12/17 0515 07/15/17 0652 07/15/17 1726 07/16/17 0520 07/17/17 0138 07/18/17 0014  WBC 16.4* 16.7* 22.8*  --  21.3* 23.4* 25.1*  NEUTROABS 14.4*  --   --   --  16.6*  --   --   HGB 11.0* 9.0* 4.7* 8.2* 8.4* 8.5* 9.6*  HCT 32.9* 26.6* 14.0* 24.6* 24.9* 25.4* 29.3*  MCV 79.7 79.4 82.8  --  85.6 87.0 87.5  PLT 414* 358 362  --  324 420* 352   Cardiac Enzymes:   No results for input(s): CKTOTAL, CKMB, CKMBINDEX, TROPONINI in the last 168 hours. BNP (last 3 results) No results for input(s): BNP in the last 8760 hours.  ProBNP (last 3 results) No results for input(s): PROBNP in the last 8760 hours.  CBG: Recent Labs  Lab 07/13/17 0511  GLUCAP 129*    No results found for this or any previous visit  (from the past 240 hour(s)).   Studies: No results found.  Scheduled Meds: . atorvastatin  10 mg Oral QHS  . bisacodyl  10 mg Rectal Daily  . gabapentin  100 mg Oral BID  . LORazepam  0.5 mg Intravenous Once  . losartan  100 mg Oral Daily  . metoprolol succinate  50 mg Oral Daily  . polyethylene glycol  17 g Oral BID  . predniSONE  60 mg Oral Q breakfast  . senna-docusate  1 tablet Oral BID  . tamsulosin  0.4 mg Oral Daily    Continuous Infusions: . heparin 1,050 Units/hr (07/17/17 1603)     Time spent: 25 mins,  I have personally reviewed and interpreted on  07/18/2017 daily labs, tele  strips, imagings as discussed above under date review session and assessment and plans.  I reviewed all nursing notes, pharmacy notes, consultant notes,  vitals, pertinent old records  I have discussed plan of care as described above with RN , patient and daughter on 07/18/2017   Florencia Reasons MD, PhD  [facility] Pager [phone]. If 7PM-7AM, please contact night-coverage at [email or web address], password [facility] 07/18/2017, 7:57 AM  LOS: 6 days

## 2017-07-18 NOTE — Progress Notes (Signed)
ANTICOAGULATION CONSULT NOTE - Follow Up Consult  Pharmacy Consult for Heparin Indication: atrial fibrillation, mechanical mitral valve replacement  See previous progress note from  Leodis Sias, PharmD for full details.  In brief, Pharmacy consulted to dose IV heparin while coumadin on hold for procedure on 2/25.  Heparin level therapeutic (0.46) on 1050 units/hr Per discussion with RN, previously reported bruising has not worsened INR pending  Plan: - Continue heparin at current rate - Recheck heparin level daily   Dorrene German 07/18/2017 1:30 AM

## 2017-07-19 LAB — CBC
HCT: 30.6 % — ABNORMAL LOW (ref 36.0–46.0)
HEMOGLOBIN: 10 g/dL — AB (ref 12.0–15.0)
MCH: 29.2 pg (ref 26.0–34.0)
MCHC: 32.7 g/dL (ref 30.0–36.0)
MCV: 89.2 fL (ref 78.0–100.0)
PLATELETS: 374 10*3/uL (ref 150–400)
RBC: 3.43 MIL/uL — AB (ref 3.87–5.11)
RDW: 19.5 % — ABNORMAL HIGH (ref 11.5–15.5)
WBC: 28.2 10*3/uL — AB (ref 4.0–10.5)

## 2017-07-19 LAB — HEPARIN LEVEL (UNFRACTIONATED): HEPARIN UNFRACTIONATED: 0.65 [IU]/mL (ref 0.30–0.70)

## 2017-07-19 LAB — URINALYSIS, ROUTINE W REFLEX MICROSCOPIC
Bilirubin Urine: NEGATIVE
GLUCOSE, UA: NEGATIVE mg/dL
Hgb urine dipstick: NEGATIVE
KETONES UR: 5 mg/dL — AB
LEUKOCYTES UA: NEGATIVE
NITRITE: NEGATIVE
Protein, ur: NEGATIVE mg/dL
Specific Gravity, Urine: 1.006 (ref 1.005–1.030)
pH: 6 (ref 5.0–8.0)

## 2017-07-19 LAB — PROTIME-INR
INR: 1.67
Prothrombin Time: 19.6 seconds — ABNORMAL HIGH (ref 11.4–15.2)

## 2017-07-19 MED ORDER — TRAMADOL HCL 50 MG PO TABS
50.0000 mg | ORAL_TABLET | Freq: Four times a day (QID) | ORAL | Status: DC | PRN
  Administered 2017-07-19 – 2017-07-21 (×5): 50 mg via ORAL
  Filled 2017-07-19 (×7): qty 1

## 2017-07-19 MED ORDER — PREDNISONE 20 MG PO TABS
40.0000 mg | ORAL_TABLET | Freq: Every day | ORAL | Status: DC
  Administered 2017-07-20: 40 mg via ORAL
  Filled 2017-07-19: qty 2

## 2017-07-19 MED ORDER — GABAPENTIN 100 MG PO CAPS
100.0000 mg | ORAL_CAPSULE | Freq: Every day | ORAL | Status: DC
  Administered 2017-07-19: 100 mg via ORAL

## 2017-07-19 NOTE — Progress Notes (Signed)
Pt felt no urge to void, bladder scan showed 381. Alger Memos, NP made aware. Order for I&O cath placed. Before I&O cath was done, pt felt urge to void. Assisted patient up to Bedside commode. Voided 384mL of cloudy amber colored malodorous urine. Patient stated she felt "empty" now. Post void residual shows between 200-450mL per bladder scan. Per C. Bodenheimer, NP, allow patient 4-6 hours to feel urge and void again. If no urge/void, perform I&O cath. Will continue to monitor.

## 2017-07-19 NOTE — Progress Notes (Signed)
IR following for ESI once INR <1.5 Coumadin held, currently on heparin.   INR 1.67 today.   Repeat INR tomorrow.  Will need to hold heparin tomorrow prior to procedure.   Brynda Greathouse, MS RD PA-C

## 2017-07-19 NOTE — Progress Notes (Signed)
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 Disc bulges at L4-5 and L5-S1." on ct ab/pel -Mri spine : "Mild multilevel degenerative changes throughout the lumbar spine as described above, worst at L4-L5 where there is mild central spinal canal stenosis and possible irritation of the descending right L5 nerve root. -she is stared on on prednisone on admission - patient became hypersomnolent though due to adding neurontin, neurontin held by my colleague,   -She is more alert , start to add  Back  Gabapentin due to continue to c/o right leg numbness, slowly increasing gabapentin dose to 100 mg 3 times daily, continue to increase neurontin as tolerated. -ct right thigh no acute findings.  -Due to persistent symptom with back pain and right lower extremity pain and numbness, case discussed with interventional radiology who plan to proceed ESI on Monday once INR less than 1.5 Hold Coumadin, bridged with heparin drip -Plan discussed with patient in room and daughter all over the phone  On 2/21 and on 2/23  Leukocytosis, from steroids? Taper steroids, No fever. Continue to monitor sHe does have some cloudy urine in the Foley catheter, will check ua and urine culture, remove Foley catheter.  Acute blood loss anemia: -hgb 4.7 dropped on 2/20 from 9 from three days  prior -No blood in the urine, no bm, no hemoptysis or hemetemesis -Ct ab /pel from 2/16 no intraabdomen bleed -She Does has large bruised in right thigh, right arm, ct right thigh no acute findings -coumadin dose reduced -prbc transfusionx2, hgb has been stable post transfusion  Metabolic Encephalopathy - Most likely secondary to medications (flexeril, tramadol, percocet, gabapentin)  -d/c flexeril and tramadol.  - pain control, Avoid constipation, foley placed for urinary retention -more alert, today is aaox3, continue to have poor memory -CT had no acute findings    Supratherapeutic INR -INR 10 on presentation, suspect patient cannot manage her own medications at home due to progressive memory issues, I have discussed with daughter over the phone who agrees nursing home placement - INR trending down. Given history of mechanical heart valve will not administer more Vitamin K and monitor. (INR 2.5-3.5 range)  PAF - Supratherapeutic INR  Currently being held. S/p given Vitamin K x 2 (on different days) - she is in afib in the hospital, Rate controlled on B blocker.      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  Code Status: DNR  Family Communication: patient in room, daughter over the phone on 2/21 and on 2/23  Disposition Plan: SNF once medically stable   Consultants:  IR  Procedures:  Foley placement  ESI planned on Monday  Antibiotics:  none   Objective: BP (!) 148/68 (BP Location: Right Arm)   Pulse 63   Temp 98.2 F (36.8 C) (Oral)   Resp 18   Ht 5\' 3"  (1.6 m)   Wt 73.4 kg (161 lb 13.1 oz)   SpO2 94%   BMI  28.66 kg/m   Intake/Output Summary (Last 24 hours) at 07/19/2017 0756 Last data filed at 07/19/2017 0600 Gross per 24 hour  Intake 601.51 ml  Output 2100 ml  Net -1498.49 ml   Filed Weights   07/13/17 8786  Weight: 73.4 kg (161 lb 13.1 oz)    Exam: Patient is examined daily including today on 07/19/2017, exams remain the same as of yesterday except that has changed    General:  Pale, demented elderly,  more alert this am, she is aaox3 this am  Cardiovascular: RRR  Respiratory: CTABL  Abdomen: Soft/ND/NT, positive BS  Musculoskeletal: No Edema  Neuro: more alert this am, she is aaox3 this am. Impaired short term memory  Skin: large bruises right thigh, right arm  Data Reviewed: Basic Metabolic Panel: Recent Labs  Lab 07/15/17 1007 07/16/17 0520 07/17/17 0138 07/18/17 0014  NA 132* 133* 130* 131*  K 4.2 4.5 4.4 4.2  CL 103 103 98* 96*  CO2 21* 22 23 25   GLUCOSE 129* 111* 110* 116*  BUN 38* 22* 15 15  CREATININE 0.80 0.68 0.59 0.55  CALCIUM 7.8* 8.2* 8.2* 8.9  MG  --   --  2.1  --    Liver Function Tests: Recent Labs  Lab 07/15/17 1007 07/16/17 0520 07/17/17 0138 07/18/17 0014  AST 44* 38 38 30  ALT 53 51 47 43  ALKPHOS 49 50 57 62  BILITOT 2.5* 2.9* 3.0* 3.9*  PROT 5.7* 5.8* 5.8* 6.4*  ALBUMIN 3.3* 3.3* 3.2* 3.7   No results for input(s): LIPASE, AMYLASE in the last 168 hours. Recent Labs  Lab 07/16/17 0519  AMMONIA 31   CBC: Recent Labs  Lab 07/15/17 0652 07/15/17 1726 07/16/17 0520 07/17/17 0138 07/18/17 0014 07/19/17 0547  WBC 22.8*  --  21.3* 23.4* 25.1* 28.2*  NEUTROABS  --   --  16.6*  --   --   --   HGB 4.7* 8.2* 8.4* 8.5* 9.6* 10.0*  HCT 14.0* 24.6* 24.9* 25.4* 29.3* 30.6*  MCV 82.8  --  85.6 87.0 87.5 89.2  PLT 362  --  324 420* 352 374   Cardiac Enzymes:   No results for input(s): CKTOTAL, CKMB, CKMBINDEX, TROPONINI in the last 168 hours. BNP (last 3 results) No results for input(s): BNP in the last 8760 hours.  ProBNP  (last 3 results) No results for input(s): PROBNP in the last 8760 hours.  CBG: Recent Labs  Lab 07/13/17 0511  GLUCAP 129*    No results found for this or any previous visit (from the past 240 hour(s)).   Studies: Ct Head Wo Contrast  Result Date: 07/18/2017 CLINICAL DATA:  History of stroke. 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 EXAM: CT HEAD WITHOUT CONTRAST TECHNIQUE: Contiguous axial images were obtained from the base of the skull through the vertex without intravenous contrast. COMPARISON:  05/05/2017 FINDINGS: Brain: Mild age related volume loss. Mild chronic small vessel ischemic changes of the cerebral hemispheric white matter. No sign of other focal infarction, mass lesion, hemorrhage, hydrocephalus or extra-axial collection. Vascular: There is atherosclerotic calcification of the major vessels at the base of the brain.  Skull: No significant finding. Left frontal outer table exostotic prominence as seen previously. Sinuses/Orbits: Clear/normal Other: None IMPRESSION: No change. Mild chronic small-vessel ischemic change of the cerebral hemispheric white matter. Atherosclerotic calcification of the major vessels at the base of the brain. Electronically Signed   By: Nelson Chimes M.D.   On: 07/18/2017 11:10    Scheduled Meds: . atorvastatin  10 mg Oral QHS  . bisacodyl  10 mg Rectal Daily  . gabapentin  100 mg Oral TID  . LORazepam  0.5 mg Intravenous Once  . losartan  100 mg Oral Daily  . metoprolol succinate  50 mg Oral Daily  . polyethylene glycol  17 g Oral BID  . predniSONE  60 mg Oral Q breakfast  . senna-docusate  1 tablet Oral BID  . tamsulosin  0.4 mg Oral Daily    Continuous Infusions: . heparin 1,050 Units/hr (07/18/17 1737)     Time spent: 25 mins,  I have personally reviewed and interpreted on  07/19/2017 daily labs, tele strips, imagings as discussed above under date review session and assessment and plans.  I  reviewed all nursing notes, pharmacy notes, consultant notes,  vitals, pertinent old records  I have discussed plan of care as described above with RN , patient  on 07/19/2017   Florencia Reasons MD, PhD  [facility] Pager [phone]. If 7PM-7AM, please contact night-coverage at [email or web address], password [facility] 07/19/2017, 7:56 AM  LOS: 7 days

## 2017-07-19 NOTE — Progress Notes (Signed)
Removed foley. Tolerated well. Will monitor urine output

## 2017-07-19 NOTE — Progress Notes (Signed)
ANTICOAGULATION CONSULT NOTE - Follow Up Consult  Pharmacy Consult for Heparin Indication: atrial fibrillation, mechanical mitral valve replacement  Allergies  Allergen Reactions  . Carvedilol Itching  . Ace Inhibitors Other (See Comments)    Reaction (??)  . Amlodipine Swelling  . Hydrochlorothiazide Other (See Comments)    Hyponatremia     Patient Measurements: Height: 5\' 3"  (160 cm) Weight: 161 lb 13.1 oz (73.4 kg) IBW/kg (Calculated) : 52.4  Heparin dosing weight: 68 kg  Vital Signs: Temp: 98.2 F (36.8 C) (02/24 0517) Temp Source: Oral (02/24 0517) BP: 148/68 (02/24 0538) Pulse Rate: 63 (02/24 0517)  Labs: Recent Labs    07/17/17 0138 07/17/17 1618 07/18/17 0014 07/19/17 0547  HGB 8.5*  --  9.6* 10.0*  HCT 25.4*  --  29.3* 30.6*  PLT 420*  --  352 374  LABPROT 31.0*  --  23.6* 19.6*  INR 3.01  --  2.12 1.67  HEPARINUNFRC 0.28* 0.56 0.46 0.65  CREATININE 0.59  --  0.55  --     Estimated Creatinine Clearance: 58.3 mL/min (by C-G formula based on SCr of 0.55 mg/dL).  Assessment: Jodi Frank present on 2/16 with R-sided sciatica.  She is on chronic warfarin therapy for atrial fibrillation and mitral valve replacement.  Her INR was >10 at time of presentation to ED. Pharmacy was consulted to manage warfarin.  Significant events: 1/7: INR was therapeutic (2.5) on 5 mg daily 2/4: INR was supratherapeutic (6.9). Was instructed to hold x2 days and resume 5 mg daily 2/14: Presented to the ED for back pain; given 8 mg dexamethasone which could have contributed to her high INR at this admission 2/16: Admitted with INR >10; given 10 mg vit K PO 2/18: INR remained elevated; given another 10 mg dose of vit K PO 2/21 Warfarin canceled d/t IR procedure plans on Monday 2/25.  Goal INR < 1.5 for procedure.  Pharmacy is consulted to dose Heparin for bridging.   07/19/2017  Heparin level therapeutic at 0.65, INR 1.67  - coumadin on hold for IR CBC: Hgb10 s/p transfusion on 2/20.   No bleeding or complications reported.  Pt has a large bruise on right thigh and arm, CT scan 2/20 showed no significant abnormality.   Goal of Therapy:  Heparin level 0.3-0.7 units/ml Monitor platelets by anticoagulation protocol: Yes   Plan:   continue heparin IV infusion at 1050 units/hr  Daily heparin level and CBC and INR  Monitor for s/s bleeding, thrombosis. interventional radiology plans ESI on Monday once INR less than 1.5  Eudelia Bunch, Pharm.D. [phone] 07/19/2017 8:15 AM

## 2017-07-19 NOTE — Progress Notes (Signed)
PT has not voided. Bladder scanned 313ml urine. Pt asymptomatic. Updated MD. MD with verbal order to toilet pt by 2000 and measure post void residual. If pt does not void, update MD

## 2017-07-20 ENCOUNTER — Encounter (HOSPITAL_COMMUNITY): Payer: Self-pay | Admitting: Diagnostic Radiology

## 2017-07-20 ENCOUNTER — Inpatient Hospital Stay (HOSPITAL_COMMUNITY): Payer: Medicare Other

## 2017-07-20 HISTORY — PX: IR INJECT/THERA/INC NEEDLE/CATH/PLC EPI/LUMB/SAC W/IMG: IMG6130

## 2017-07-20 LAB — URINALYSIS, ROUTINE W REFLEX MICROSCOPIC
BILIRUBIN URINE: NEGATIVE
Glucose, UA: NEGATIVE mg/dL
Ketones, ur: NEGATIVE mg/dL
Nitrite: NEGATIVE
PH: 6 (ref 5.0–8.0)
Protein, ur: 100 mg/dL — AB
SPECIFIC GRAVITY, URINE: 1.01 (ref 1.005–1.030)
Squamous Epithelial / LPF: NONE SEEN

## 2017-07-20 LAB — BASIC METABOLIC PANEL
ANION GAP: 12 (ref 5–15)
BUN: 19 mg/dL (ref 6–20)
CHLORIDE: 97 mmol/L — AB (ref 101–111)
CO2: 22 mmol/L (ref 22–32)
Calcium: 9.1 mg/dL (ref 8.9–10.3)
Creatinine, Ser: 0.63 mg/dL (ref 0.44–1.00)
GFR calc Af Amer: >60 mL/min (ref >60)
Glucose, Bld: 90 mg/dL (ref 65–99)
Potassium: 3.7 mmol/L (ref 3.5–5.1)
SODIUM: 131 mmol/L — AB (ref 135–145)

## 2017-07-20 LAB — PROTIME-INR
INR: 1.42
Prothrombin Time: 17.2 seconds — ABNORMAL HIGH (ref 11.4–15.2)

## 2017-07-20 LAB — CBC
HEMATOCRIT: 32 % — AB (ref 36.0–46.0)
HEMOGLOBIN: 10.6 g/dL — AB (ref 12.0–15.0)
MCH: 29.7 pg (ref 26.0–34.0)
MCHC: 33.1 g/dL (ref 30.0–36.0)
MCV: 89.6 fL (ref 78.0–100.0)
Platelets: 332 10*3/uL (ref 150–400)
RBC: 3.57 MIL/uL — AB (ref 3.87–5.11)
RDW: 20.2 % — ABNORMAL HIGH (ref 11.5–15.5)
WBC: 26.1 10*3/uL — ABNORMAL HIGH (ref 4.0–10.5)

## 2017-07-20 LAB — HEPARIN LEVEL (UNFRACTIONATED): Heparin Unfractionated: 0.41 IU/mL (ref 0.30–0.70)

## 2017-07-20 LAB — LACTIC ACID, PLASMA: LACTIC ACID, VENOUS: 1.6 mmol/L (ref 0.5–1.9)

## 2017-07-20 MED ORDER — METHYLPREDNISOLONE ACETATE 40 MG/ML IJ SUSP
INTRAMUSCULAR | Status: AC
  Filled 2017-07-20: qty 1

## 2017-07-20 MED ORDER — SODIUM CHLORIDE 0.9 % IJ SOLN
INTRAMUSCULAR | Status: AC
Start: 2017-07-20 — End: 2017-07-21
  Filled 2017-07-20: qty 10

## 2017-07-20 MED ORDER — METHYLPREDNISOLONE ACETATE 80 MG/ML IJ SUSP
INTRAMUSCULAR | Status: AC
  Filled 2017-07-20: qty 1

## 2017-07-20 MED ORDER — PREDNISONE 20 MG PO TABS
20.0000 mg | ORAL_TABLET | Freq: Every day | ORAL | Status: DC
  Administered 2017-07-21: 20 mg via ORAL
  Filled 2017-07-20: qty 1

## 2017-07-20 MED ORDER — SODIUM CHLORIDE 0.9 % IV SOLN
1.0000 g | INTRAVENOUS | Status: DC
  Administered 2017-07-20 – 2017-07-21 (×2): 1 g via INTRAVENOUS
  Filled 2017-07-20 (×2): qty 1
  Filled 2017-07-20: qty 10

## 2017-07-20 MED ORDER — LIDOCAINE HCL 1 % IJ SOLN
INTRAMUSCULAR | Status: AC
  Filled 2017-07-20: qty 20

## 2017-07-20 MED ORDER — GABAPENTIN 100 MG PO CAPS
200.0000 mg | ORAL_CAPSULE | Freq: Three times a day (TID) | ORAL | Status: DC
  Administered 2017-07-20 – 2017-07-22 (×6): 200 mg via ORAL
  Filled 2017-07-20 (×6): qty 2

## 2017-07-20 MED ORDER — IOPAMIDOL (ISOVUE-M 200) INJECTION 41%
INTRAMUSCULAR | Status: AC
  Filled 2017-07-20: qty 10

## 2017-07-20 MED ORDER — LIDOCAINE HCL (PF) 1 % IJ SOLN
INTRAMUSCULAR | Status: AC
  Filled 2017-07-20: qty 30

## 2017-07-20 MED ORDER — HEPARIN (PORCINE) IN NACL 100-0.45 UNIT/ML-% IJ SOLN
1050.0000 [IU]/h | INTRAMUSCULAR | Status: DC
  Administered 2017-07-20 (×2): 1050 [IU]/h via INTRAVENOUS
  Filled 2017-07-20: qty 250

## 2017-07-20 NOTE — Progress Notes (Signed)
ANTICOAGULATION CONSULT NOTE - Follow Up Consult  Pharmacy Consult for Heparin Indication: atrial fibrillation, mechanical mitral valve replacement  Allergies  Allergen Reactions  . Carvedilol Itching  . Ace Inhibitors Other (See Comments)    Reaction (??)  . Amlodipine Swelling  . Hydrochlorothiazide Other (See Comments)    Hyponatremia     Patient Measurements: Height: 5\' 3"  (160 cm) Weight: 161 lb 13.1 oz (73.4 kg) IBW/kg (Calculated) : 52.4  Heparin dosing weight: 68 kg  Vital Signs: Temp: 98.9 F (37.2 C) (02/25 1316) Temp Source: Oral (02/25 1316) BP: 154/63 (02/25 1316) Pulse Rate: 62 (02/25 1316)  Labs: Recent Labs    07/18/17 0014 07/19/17 0547 07/20/17 0535  HGB 9.6* 10.0* 10.6*  HCT 29.3* 30.6* 32.0*  PLT 352 374 332  LABPROT 23.6* 19.6* 17.2*  INR 2.12 1.67 1.42  HEPARINUNFRC 0.46 0.65 0.41  CREATININE 0.55  --  0.63    Estimated Creatinine Clearance: 58.3 mL/min (by C-G formula based on SCr of 0.63 mg/dL).  Assessment: [name] present on 2/16 with R-sided sciatica. She is on chronic warfarin therapy for atrial fibrillation and mitral valve replacement. Her INR was >10 at time of presentation to ED. Pharmacy was consulted to manage warfarin.  Significant events: 1/7: INR was therapeutic (2.5) on 5 mg daily 2/4: INR was supratherapeutic (6.9). Was instructed to hold x2 days and resume 5 mg daily 2/14: Presented to the ED for back pain; given 8 mg dexamethasone which could have contributed to her high INR at this admission 2/16: Admitted with INR >10; given 10 mg vit K PO 2/18: INR remained elevated; given another 10 mg dose of vit K PO 2/21 Warfarin canceled d/t IR procedure plans on Monday 2/25.  Goal INR < 1.5 for procedure. Pharmacy is consulted to dose Heparin for bridging.  Today, 07/20/2017:  Heparin level therapeutic at 0.41  INR decreased to 1.42, warfarin on hold  CBC: Hgb10.6, stable s/p transfusion on 2/20.  Plt WNL.  No bleeding or  complications reported.  Pt has a large bruise on right thigh and arm, CT scan 2/20 showed no significant abnormality  Spoke with IR MD at 16:45 s/p successful epidural spinal injection; recommended resuming heparin 2 hrs after the procedure, acceptable to transition to Lovenox/Warfarin 12 hrs after the procedure  Goal of Therapy:  Heparin level 0.3-0.7 units/ml Monitor platelets by anticoagulation protocol: Yes   Plan:   Resume heparin IV infusion at 1050 units/hr starting at 19:00  Daily heparin level, INR, and CBC  Monitor for s/s bleeding, thrombosis.  If patient remains stable overnight, will plan to transition tomorrow morning to Lovenox + warfarin.   Reuel Boom, PharmD, BCPS [phone] 07/20/2017, 5:05 PM

## 2017-07-20 NOTE — NC FL2 (Signed)
[place] LEVEL OF CARE SCREENING TOOL     IDENTIFICATION  Patient Name: Jodi Frank Birthdate: 08/04/1940 Sex: female Admission Date (Current Location): 07/11/2017  [facility] and [place] Number:  Herbalist and Address:  [facility],  [place], [place]      Provider Number: [id number]  Attending Physician Name and Address:  Florencia Reasons, MD  Relative Name and Phone Number:  Verneice Caspers [phone]    Current Level of Care: Hospital Recommended Level of Care: [place] Prior Approval Number:    Date Approved/Denied:   PASRR Number: [id number]  Discharge Plan: SNF    Current Diagnoses: Patient Active Problem List   Diagnosis Date Noted  . Right sided sciatica 07/12/2017  . Supratherapeutic INR 07/12/2017  . Acute urinary retention 07/12/2017  . Leukocytosis 07/12/2017  . Sciatica 07/12/2017  . Anemia 05/15/2017  . Paroxysmal atrial fibrillation ([place]) 05/15/2017  . Abdominal aortic atherosclerosis ([place]) 05/06/2017  . SIADH (syndrome of inappropriate ADH production) ([place]) 05/06/2017  . Mild cognitive impairment 05/06/2017  . H/O mitral valve replacement with mechanical valve 05/06/2017  . Stage 1 skin ulcer of sacral region ([place]) 05/05/2017  . Scalp laceration 05/05/2017  . Acute encephalopathy 05/05/2017  . Fall 05/04/2017  . Intolerance of drug 07/03/2016  . Memory difficulty 01/18/2016  . Vertigo 01/18/2016  . Mixed hyperlipidemia 07/12/2014  . Thiazide diuretic adverse reaction 01/03/2014  . Osteoporosis 01/14/2013  . External hemorrhoids 12/24/2012  . Hip fracture, left S/P cannulated hip pinning 11/10/2012  . Insomnia 11/10/2012  . Muscle spasm 11/10/2012  . Essential hypertension, benign 11/10/2012  . Constipation 11/10/2012  . Distal radius fracture, left 11/10/2012  . [place] term (current) use of anticoagulants 11/10/2012  . Femoral neck fracture ([place]) 11/10/2012  . Acute blood loss  anemia 11/05/2012  . Overactive bladder 09/05/2011  . Rosacea 09/04/2011  . Renal artery stenosis ([place]) 06/17/2011  . DIARRHEA 01/30/2010  . DEPRESSION 12/08/2006  . ALLERGIC RHINITIS 12/08/2006    Orientation RESPIRATION BLADDER Height & Weight     Self, Time, Situation, Place  Normal Continent Weight: 161 lb 13.1 oz (73.4 kg) Height:  5\' 3"  (160 cm)  BEHAVIORAL SYMPTOMS/MOOD NEUROLOGICAL BOWEL NUTRITION STATUS        Diet(Heart)  AMBULATORY STATUS COMMUNICATION OF NEEDS Skin   Extensive Assist Verbally PU Stage and Appropriate Care( PressureInjury12/10/18StageI-Intactskinwithnon-blanchablerednessofalocalizedareausuallyoverabonyprominence.) PU Stage 1 Dressing: No Dressing                     Personal Care Assistance Level of Assistance  Bathing, Feeding, Dressing Bathing Assistance: Maximum assistance Feeding assistance: Independent Dressing Assistance: Maximum assistance     Functional Limitations Info  Sight, Hearing, Speech Sight Info: Adequate Hearing Info: Adequate Speech Info: Adequate    SPECIAL CARE FACTORS FREQUENCY  PT (By licensed PT), OT (By licensed OT)     PT Frequency: 5x/week OT Frequency: 5x/week            Contractures Contractures Info: Not present    Additional Factors Info  Code Status, Allergies Code Status Info: DNR Allergies Info: Carvedilol;Ace Inhibitors;Amlodipine;Hydrochlorothiazide           Current Medications (07/20/2017):  This is the current hospital active medication list Current Facility-Administered Medications  Medication Dose Route Frequency Provider Last Rate Last Dose  . acetaminophen (TYLENOL) tablet 650 mg  650 mg Oral Q6H PRN Fuller Plan A, MD   650 mg at 07/19/17 2016   Or  .  acetaminophen (TYLENOL) suppository 650 mg  650 mg Rectal Q6H PRN Tamala Julian, Rondell A, MD      . albuterol (PROVENTIL) (2.5 MG/3ML) 0.083% nebulizer solution 2.5 mg  2.5 mg Nebulization Q4H PRN Smith, Rondell A, MD       . atorvastatin (LIPITOR) tablet 10 mg  10 mg Oral QHS Smith, Rondell A, MD   10 mg at 07/19/17 2215  . cefTRIAXone (ROCEPHIN) 1 g in sodium chloride 0.9 % 100 mL IVPB  1 g Intravenous Q24H Florencia Reasons, MD      . gabapentin (NEURONTIN) capsule 100 mg  100 mg Oral TID Florencia Reasons, MD   100 mg at 07/20/17 1017  . gabapentin (NEURONTIN) capsule 100 mg  100 mg Oral QHS Florencia Reasons, MD   100 mg at 07/19/17 2215  . heparin ADULT infusion 100 units/mL (25000 units/275mL sodium chloride 0.45%)  1,050 Units/hr Intravenous Continuous Leodis Sias T, RPH 10.5 mL/hr at 07/19/17 1612 1,050 Units/hr at 07/19/17 1612  . LORazepam (ATIVAN) injection 0.5 mg  0.5 mg Intravenous Once Virgel Manifold, MD      . losartan (COZAAR) tablet 100 mg  100 mg Oral Daily Tamala Julian, Rondell A, MD   100 mg at 07/20/17 1017  . metoprolol succinate (TOPROL-XL) 24 hr tablet 50 mg  50 mg Oral Daily Smith, Rondell A, MD   50 mg at 07/20/17 1017  . naloxone [facility]) injection 0.4 mg  0.4 mg Intravenous PRN Fuller Plan A, MD   0.4 mg at 07/13/17 0510  . ondansetron (ZOFRAN) tablet 4 mg  4 mg Oral Q6H PRN Fuller Plan A, MD       Or  . ondansetron (ZOFRAN) injection 4 mg  4 mg Intravenous Q6H PRN Fuller Plan A, MD   4 mg at 07/15/17 0248  . polyethylene glycol (MIRALAX / GLYCOLAX) packet 17 g  17 g Oral BID Florencia Reasons, MD   17 g at 07/19/17 6644  . predniSONE (DELTASONE) tablet 40 mg  40 mg Oral Q breakfast Florencia Reasons, MD   40 mg at 07/20/17 0835  . senna-docusate (Senokot-S) tablet 1 tablet  1 tablet Oral BID Florencia Reasons, MD   1 tablet at 07/20/17 1017  . tamsulosin (FLOMAX) capsule 0.4 mg  0.4 mg Oral Daily Florencia Reasons, MD   0.4 mg at 07/20/17 1017  . traMADol (ULTRAM) tablet 50 mg  50 mg Oral Q6H PRN Florencia Reasons, MD   50 mg at 07/20/17 1029     Discharge Medications: Please see discharge summary for a list of discharge medications.  Relevant Imaging Results:  Relevant Lab Results:   Additional Information [id number]  Burnis Medin,  LCSW

## 2017-07-20 NOTE — Progress Notes (Signed)
ANTICOAGULATION CONSULT NOTE - Follow Up Consult  Pharmacy Consult for Heparin Indication: atrial fibrillation, mechanical mitral valve replacement  Allergies  Allergen Reactions  . Carvedilol Itching  . Ace Inhibitors Other (See Comments)    Reaction (??)  . Amlodipine Swelling  . Hydrochlorothiazide Other (See Comments)    Hyponatremia     Patient Measurements: Height: 5\' 3"  (160 cm) Weight: 161 lb 13.1 oz (73.4 kg) IBW/kg (Calculated) : 52.4  Heparin dosing weight: 68 kg  Vital Signs: Temp: 97.9 F (36.6 C) (02/25 0415) Temp Source: Oral (02/25 0415) BP: 182/95 (02/25 0415) Pulse Rate: 90 (02/25 0415)  Labs: Recent Labs    07/18/17 0014 07/19/17 0547 07/20/17 0535  HGB 9.6* 10.0* 10.6*  HCT 29.3* 30.6* 32.0*  PLT 352 374 332  LABPROT 23.6* 19.6* 17.2*  INR 2.12 1.67 1.42  HEPARINUNFRC 0.46 0.65 0.41  CREATININE 0.55  --  0.63    Estimated Creatinine Clearance: 58.3 mL/min (by C-G formula based on SCr of 0.63 mg/dL).  Assessment: [name] present on 2/16 with R-sided sciatica.  She is on chronic warfarin therapy for atrial fibrillation and mitral valve replacement.  Her INR was >10 at time of presentation to ED. Pharmacy was consulted to manage warfarin.  Significant events: 1/7: INR was therapeutic (2.5) on 5 mg daily 2/4: INR was supratherapeutic (6.9). Was instructed to hold x2 days and resume 5 mg daily 2/14: Presented to the ED for back pain; given 8 mg dexamethasone which could have contributed to her high INR at this admission 2/16: Admitted with INR >10; given 10 mg vit K PO 2/18: INR remained elevated; given another 10 mg dose of vit K PO 2/21 Warfarin canceled d/t IR procedure plans on Monday 2/25.  Goal INR < 1.5 for procedure.  Pharmacy is consulted to dose Heparin for bridging.    07/20/2017  Heparin level therapeutic at 0.41 INR decreased to 1.42, warfarin on hold CBC: Hgb10.6, stable s/p transfusion on 2/20.  Plt WNL. No bleeding or  complications reported.  Pt has a large bruise on right thigh and arm, CT scan 2/20 showed no significant abnormality.  Goal of Therapy:  Heparin level 0.3-0.7 units/ml Monitor platelets by anticoagulation protocol: Yes   Plan:   Continue heparin IV infusion at 1050 units/hr  Daily heparin level, INR, and CBC  Monitor for s/s bleeding, thrombosis.  Follow up plans to hold heparin prior to IR ESI on Monday with INR < 1.5, and post procedure anticoagulation plans.  Gretta Arab PharmD, BCPS Pager [phone] 07/20/2017 7:07 AM

## 2017-07-20 NOTE — Clinical Social Work Note (Signed)
Clinical Social Work Assessment  Patient Details  Name: Jodi Frank MRN: [id number] Date of Birth: 08/08/1940  Date of referral:  07/20/17               Reason for consult:  Facility Placement                Permission sought to share information with:  Facility Sport and exercise psychologist, Family Supports Permission granted to share information::  Yes, Verbal Permission Granted  Name::     Jodi Frank  Agency::     Relationship::  [phone]  Contact Information:  Daughter  Housing/Transportation Living arrangements for the past 2 months:  [place] of Information:  Patient, Adult Children Patient Interpreter Needed:  None Criminal Activity/Legal Involvement Pertinent to Current Situation/Hospitalization:  No - Comment as needed Significant Relationships:  Adult Children Lives with:  Self Do you feel safe going back to the place where you live?  (PT recommending SNF) Need for family participation in patient care:  Yes (Comment)  Care giving concerns:  Patient from home alone. Patient's daughter reported that patient is independent with ambulation and ADLs at baseline. Patient's daughter reported that patient volunteers at [facility] and still drives. PT recommending SNF.    Social Worker assessment / plan:  CSW spoke with patient at bedside/patient's daughter was on speaker phone. CSW informed patient about PT recommendation for SNF, patient reported that she is agreeable to SNF depending on where she has to go. Patient reported that she has been to [facility] in the past and that she doesn't want to return there. Patient deferred to her daughter to discuss discharge planning further.  Patient's daughter reported that she is in agreement with patient going to SNF for ST rehab. Patient's daughter reported that she resides in [place], [place] and that she is not able to be at the hospital with patient. Patient's daughter reported that she has hired [place]  nursing to come and stay with patient for additional assistance in the home. Patient's daughter reported that she feels that patient has dementia and that she forgets things. Patient's daughter reported that she feels patient may be forgetting to take her meds. Patient's daughter reported that she has been thinking about speaking with patient about ILF/ALF moving forward. CSW provided active listening and validated patient's daughter's concerns.CSW agreed to start SNF placement process for rehab.  CSW completed FL2 and will follow up with bed offers.  CSW will continue to follow and assist with discharge planning.  Employment status:  Retired Forensic scientist:  Medicare PT Recommendations:  [place] / Referral to community resources:  [place]  Patient/Family's Response to care:  Patient/patient's daughter appreciative of CSW assistance with discharge planning.  Patient/Family's Understanding of and Emotional Response to Diagnosis, Current Treatment, and Prognosis:  Patient presented calm with minimal speech. Patient reported that she is feeling better. Patient's daughter involved in patient's care and verbalized understanding of patient's current treatment. Patient's daughter verbalized plan for patient to dc to SNF for rehab prior to returning home.   Emotional Assessment Appearance:  Appears stated age Attitude/Demeanor/Rapport:  Other(Cooperative) Affect (typically observed):  Quiet Orientation:  Fluctuating Orientation (Suspected and/or reported Sundowners), Oriented to Self, Oriented to Place, Oriented to  Time, Oriented to Situation Alcohol / Substance use:  Not Applicable Psych involvement (Current and /or in the community):  No (Comment)  Discharge Needs  Concerns to be addressed:  Care Coordination Readmission within the last  30 days:  No Current discharge risk:  Physical Impairment, Lives alone Barriers to Discharge:  Continued  Medical Work up   [employer], LCSW 07/20/2017, 1:33 PM

## 2017-07-20 NOTE — Procedures (Signed)
Technically successful fluoroscopically guided right interlaminar ESI at L4-L5. 120 mg Depomedrol and 3 ml of 1% lidocaine were administered. No immediate complications.  Recommend close monitoring of lower extremity motor and sensory function for the next 12 hours.  OK to resume heparin drip in 2 hours. OK to start lovenox and/or warfarin in 12 hours.

## 2017-07-20 NOTE — Progress Notes (Addendum)
PROGRESS NOTE  Jodi Frank GQQ:[id number] DOB: 1940/11/21 DOA: 07/11/2017 PCP: Gildardo Cranker, DO  HPI/Recap of past 24 hours:  Urine is cloudy,She was able to void on her own last night bladder scan showed postvoid residual 200, however she denies symptom, no fever  She appears continue to improve, alert oriented x3 this morning, does has impaired memory currently denies of back pain, continue complaining of right lower leg and foot being numb, she is reluctant to get out of bed.  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 Disc bulges at L4-5 and L5-S1." on ct ab/pel -Mri spine : "Mild multilevel degenerative changes throughout the lumbar spine as described above, worst at L4-L5 where there is mild central spinal canal stenosis and possible irritation of the descending right L5 nerve root. --ct right thigh no acute findings. - Due to persistent symptom with back pain and right lower extremity pain and numbness, case discussed with interventional radiology who plan to proceed ESI on Monday once INR less than 1.5 Hold Coumadin, bridged with heparin drip -she is stared on on prednisone on admission, taper steroids, increase neurontin as tolerated.   Leukocytosis, from steroids? Taper steroids, No fever. Continue to monitor sHe does have some cloudy urine in the Foley catheter, will check ua and urine culture, remove Foley catheter.   Acute urinary retention -Found to be be retaining 1.6 L of urine  on admission. -  foley inserted on admission, she is started on  flomax,  avoid constipation, physical therapy -voiding trial on 2/24, post void residual 200cc, she denies symptom, will continue to monitor - urine is cloudy, will empirically  start on rocephin, f/u on urine culture.   Metabolic Encephalopathy, CT had no acute findings - Most likely secondary to medications (flexeril, tramadol, percocet, gabapentin)  -d/c flexeril and tramadol.  - pain control, Avoid constipation, foley placed for urinary retention - alert,  aaox3, continue to have poor memory  Acute blood loss anemia: -hgb 4.7 dropped on 2/20 from 9 from three days prior -No blood in the urine, no bm, no hemoptysis or hemetemesis -Ct ab /pel from 2/16 no intraabdomen bleed -She Does has large bruised in right thigh, right arm, ct right thigh no acute findings -coumadin dose reduced -prbc transfusionx2, hgb has been stable post transfusion    Supratherapeutic INR -INR 10 on presentation, suspect patient cannot manage her own medications at home due to progressive memory issues, I have discussed with daughter over the phone who agrees nursing home placement - INR trending down. Given history of mechanical heart valve will not administer more Vitamin K and monitor. (INR 2.5-3.5 range)  PAF - Supratherapeutic INR  Currently being held. S/p given Vitamin K x 2 (on different days) - she is in afib in the hospital, Rate controlled on B blocker.     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  Code Status: DNR  Family Communication: patient in room, daughter over the phone on 2/21 and on 2/23  Disposition Plan: SNF once medically stable   Consultants:  IR  Procedures:  Foley placement  ESI  on Monday 2/25  Antibiotics:  Rocephin from 2/25   Objective: BP (!) 154/63 (BP Location: Right Arm)   Pulse 62   Temp 98.9 F (37.2 C) (Oral)   Resp 16   Ht 5\' 3"  (1.6 m)   Wt 73.4 kg (161 lb 13.1 oz)   SpO2 96%   BMI 28.66 kg/m     Intake/Output Summary (Last 24 hours) at 07/20/2017 1532 Last data filed at 07/20/2017 1521 Gross per 24 hour  Intake 934.2 ml  Output 1980 ml  Net -1045.8 ml   Filed Weights   07/13/17 6283  Weight: 73.4 kg (161 lb 13.1 oz)    Exam: Patient is examined daily including today on 07/20/2017, exams remain the same as of yesterday except that has changed    General: improving, alert , aaox3 , Impaired short term memory  Cardiovascular: RRR  Respiratory: CTABL  Abdomen: Soft/ND/NT, positive BS  Musculoskeletal: No Edema  Neuro: more alert this am, she is aaox3 this am. Impaired short term memory  Skin: large bruises right thigh, right arm  Data Reviewed: Basic Metabolic Panel: Recent Labs  Lab 07/15/17 1007 07/16/17 0520 07/17/17 0138 07/18/17 0014 07/20/17 0535  NA 132* 133* 130* 131* 131*  K 4.2 4.5 4.4 4.2 3.7  CL 103 103 98* 96* 97*  CO2 21* 22 23 25 22   GLUCOSE 129* 111* 110* 116* 90  BUN 38* 22* 15 15 19   CREATININE 0.80 0.68 0.59 0.55 0.63  CALCIUM 7.8* 8.2* 8.2* 8.9 9.1  MG  --   --  2.1  --   --    Liver Function Tests: Recent Labs  Lab 07/15/17 1007 07/16/17 0520 07/17/17 0138 07/18/17 0014  AST 44* 38 38 30  ALT 53 51 47 43  ALKPHOS 49 50 57 62  BILITOT 2.5* 2.9* 3.0* 3.9*  PROT 5.7* 5.8* 5.8* 6.4*  ALBUMIN 3.3* 3.3* 3.2* 3.7   No results for input(s): LIPASE, AMYLASE in the last 168 hours. Recent Labs  Lab 07/16/17 0519  AMMONIA 31   CBC: Recent Labs  Lab 07/16/17 0520 07/17/17 0138 07/18/17 0014 07/19/17 0547 07/20/17 0535  WBC 21.3* 23.4* 25.1* 28.2* 26.1*  NEUTROABS 16.6*  --   --   --   --   HGB 8.4* 8.5* 9.6* 10.0* 10.6*  HCT 24.9* 25.4* 29.3* 30.6* 32.0*  MCV 85.6 87.0 87.5 89.2 89.6  PLT 324 420* 352 374 332   Cardiac Enzymes:   No results for input(s): CKTOTAL, CKMB, CKMBINDEX, TROPONINI in the last 168 hours. BNP (last 3 results) No results for input(s): BNP in the last 8760 hours.  ProBNP (last 3 results) No  results for input(s): PROBNP in the last 8760 hours.  CBG: No results for input(s): GLUCAP in the last 168 hours.  No results found for this or any previous visit (from the past 240 hour(s)).   Studies: No results found.  Scheduled Meds: . atorvastatin  10 mg Oral QHS  . gabapentin  100 mg Oral TID  . gabapentin  100 mg Oral QHS  . LORazepam  0.5 mg Intravenous Once  . losartan  100 mg Oral Daily  . metoprolol succinate  50 mg Oral Daily  . polyethylene glycol  17 g Oral BID  . predniSONE  40 mg Oral Q breakfast  . senna-docusate  1 tablet Oral BID  . tamsulosin  0.4 mg Oral Daily    Continuous Infusions: . cefTRIAXone (ROCEPHIN)  IV Stopped (07/20/17 1327)  . heparin Stopped (07/20/17 1248)  Time spent: 25 mins,  I have personally reviewed and interpreted on  07/20/2017 daily labs, tele strips, imagings as discussed above under date review session and assessment and plans.  I reviewed all nursing notes, pharmacy notes, consultant notes,  vitals, pertinent old records  I have discussed plan of care as described above with RN , patient  on 07/20/2017   Florencia Reasons MD, PhD  [facility] Pager [phone]. If 7PM-7AM, please contact night-coverage at [email or web address], password [facility] 07/20/2017, 3:32 PM  LOS: 8 days

## 2017-07-20 NOTE — Progress Notes (Signed)
PT Cancellation Note  Patient Details Name: Jodi Frank MRN: [id number] DOB: Sep 19, 1940   Cancelled Treatment:     pt to receive ESI today per chart review.  Believe pt will perform better after.  Pt has been evaluated and rec for SNF.     Rica Koyanagi  PTA [facility]  Acute  Rehab Pager      [phone]

## 2017-07-20 NOTE — Progress Notes (Signed)
Performed a bladder scan to assess for retention, per MD orders. Patient voided 169ml of urine and then bladder scan was performed to assess post void residual. Bladder scan showed about 260ml of urine in the bladder. Will continue to monitor the patient's urine output.   [name] [facility] 07/20/2017

## 2017-07-21 LAB — CBC WITH DIFFERENTIAL/PLATELET
BASOS ABS: 0 10*3/uL (ref 0.0–0.1)
Basophils Relative: 0 %
EOS ABS: 0 10*3/uL (ref 0.0–0.7)
Eosinophils Relative: 0 %
HCT: 31.2 % — ABNORMAL LOW (ref 36.0–46.0)
Hemoglobin: 10.4 g/dL — ABNORMAL LOW (ref 12.0–15.0)
LYMPHS PCT: 8 %
Lymphs Abs: 2.2 10*3/uL (ref 0.7–4.0)
MCH: 30.1 pg (ref 26.0–34.0)
MCHC: 33.3 g/dL (ref 30.0–36.0)
MCV: 90.2 fL (ref 78.0–100.0)
Monocytes Absolute: 1.9 10*3/uL — ABNORMAL HIGH (ref 0.1–1.0)
Monocytes Relative: 7 %
NEUTROS ABS: 23 10*3/uL — AB (ref 1.7–7.7)
NEUTROS PCT: 85 %
PLATELETS: 332 10*3/uL (ref 150–400)
RBC: 3.46 MIL/uL — AB (ref 3.87–5.11)
RDW: 20.9 % — ABNORMAL HIGH (ref 11.5–15.5)
WBC: 27.1 10*3/uL — AB (ref 4.0–10.5)

## 2017-07-21 LAB — COMPREHENSIVE METABOLIC PANEL
ALBUMIN: 3.4 g/dL — AB (ref 3.5–5.0)
ALK PHOS: 58 U/L (ref 38–126)
ALT: 24 U/L (ref 14–54)
AST: 18 U/L (ref 15–41)
Anion gap: 9 (ref 5–15)
BUN: 18 mg/dL (ref 6–20)
CHLORIDE: 99 mmol/L — AB (ref 101–111)
CO2: 23 mmol/L (ref 22–32)
CREATININE: 0.59 mg/dL (ref 0.44–1.00)
Calcium: 9.3 mg/dL (ref 8.9–10.3)
GFR calc Af Amer: >60 mL/min (ref >60)
GFR calc non Af Amer: >60 mL/min (ref >60)
GLUCOSE: 119 mg/dL — AB (ref 65–99)
Potassium: 4.2 mmol/L (ref 3.5–5.1)
SODIUM: 131 mmol/L — AB (ref 135–145)
Total Bilirubin: 2.4 mg/dL — ABNORMAL HIGH (ref 0.3–1.2)
Total Protein: 6.3 g/dL — ABNORMAL LOW (ref 6.5–8.1)

## 2017-07-21 LAB — HEPARIN LEVEL (UNFRACTIONATED): HEPARIN UNFRACTIONATED: 0.44 [IU]/mL (ref 0.30–0.70)

## 2017-07-21 LAB — PROTIME-INR
INR: 1.2
Prothrombin Time: 15.1 seconds (ref 11.4–15.2)

## 2017-07-21 LAB — MAGNESIUM: Magnesium: 1.9 mg/dL (ref 1.7–2.4)

## 2017-07-21 MED ORDER — WARFARIN - PHARMACIST DOSING INPATIENT: Freq: Every day | Status: DC

## 2017-07-21 MED ORDER — PREDNISONE 5 MG PO TABS
10.0000 mg | ORAL_TABLET | Freq: Every day | ORAL | Status: AC
  Administered 2017-07-22: 10 mg via ORAL
  Filled 2017-07-21: qty 2

## 2017-07-21 MED ORDER — ENOXAPARIN SODIUM 80 MG/0.8ML ~~LOC~~ SOLN
70.0000 mg | Freq: Two times a day (BID) | SUBCUTANEOUS | Status: DC
Start: 2017-07-21 — End: 2017-07-22
  Administered 2017-07-21 – 2017-07-22 (×3): 70 mg via SUBCUTANEOUS
  Filled 2017-07-21 (×3): qty 0.8

## 2017-07-21 MED ORDER — WARFARIN SODIUM 2 MG PO TABS
2.0000 mg | ORAL_TABLET | Freq: Once | ORAL | Status: AC
  Administered 2017-07-21: 2 mg via ORAL
  Filled 2017-07-21: qty 1

## 2017-07-21 NOTE — Progress Notes (Signed)
Physical Therapy Treatment Patient Details Name: Jodi Frank MRN: [id number] DOB: 11-Apr-1941 Today's Date: 07/21/2017    History of Present Illness 77 yo female admitted with R side sciatica. MRI (+) disc bulge L4-L5, L5-S1, spinal canal stenosis, possible L5 nerve root. Hx of fall, A fib, subgaleal hematoma    PT Comments    Assisted OOB to recliner pt having NO back pain.  Much improved.  Does however still c/o R foot numbness from ankle down.  Assisted with amb using walker, pt tolerating full WBing R LE and no true foot drop/toe drag. Pt appears worried "I can't understand why my foot is still numb".     Follow Up Recommendations  SNF     Equipment Recommendations  None recommended by PT    Recommendations for Other Services       Precautions / Restrictions Precautions Precautions: Fall Precaution Comments: R foot numb Restrictions Weight Bearing Restrictions: No Other Position/Activity Restrictions: WBAT    Mobility  Bed Mobility Overal bed mobility: Needs Assistance Bed Mobility: Supine to Sit     Supine to sit: Min assist;Mod assist     General bed mobility comments: Assist for trunk and bil LEs. Increased time.   increased ability to self perform  Transfers Overall transfer level: Needs assistance Equipment used: Rolling walker (2 wheeled);None Transfers: Sit to/from [employer] to Stand: Min assist Stand pivot transfers: Min assist;Mod assist       General transfer comment: 75% VC's for direction assisted OOB to recliner and out of recliner to amb  Ambulation/Gait Ambulation/Gait assistance: Min assist Ambulation Distance (Feet): 65 Feet Assistive device: Rolling walker (2 wheeled) Gait Pattern/deviations: Step-to pattern;Decreased stance time - right Gait velocity: decreased   General Gait Details: tolerated an increased distance and able to fully WB thru R LE but constant c/o R foot numbness "I don't understand"   pt has  active dorsi flex and no true foot drop/toe drag during gait.   Stairs            Wheelchair Mobility    Modified Rankin (Stroke Patients Only)       Balance                                            Cognition Arousal/Alertness: Awake/alert Behavior During Therapy: WFL for tasks assessed/performed                                   General Comments: short term memory and perseverance reguarding R foot numbneess      Exercises      General Comments        Pertinent Vitals/Pain Pain Assessment: Faces Faces Pain Scale: Hurts a little bit Pain Location: back pain is much improved but R LE pain still present at mid thigh down below knee and R foot is numb from ankle down Pain Descriptors / Indicators: Moaning;Grimacing;Guarding;Crying;Numbness Pain Intervention(s): Repositioned;Monitored during session    Home Living                      Prior Function            PT Goals (current goals can now be found in the care plan section) Progress towards PT goals: Progressing toward goals    Frequency  Min 2X/week      PT Plan Current plan remains appropriate    Co-evaluation              AM-PAC PT "6 Clicks" Daily Activity  Outcome Measure  Difficulty turning over in bed (including adjusting bedclothes, sheets and blankets)?: A Lot Difficulty moving from lying on back to sitting on the side of the bed? : A Lot Difficulty sitting down on and standing up from a chair with arms (e.g., wheelchair, bedside commode, etc,.)?: A Lot Help needed moving to and from a bed to chair (including a wheelchair)?: A Lot Help needed walking in hospital room?: A Lot Help needed climbing 3-5 steps with a railing? : Total 6 Click Score: 11    End of Session Equipment Utilized During Treatment: Gait belt Activity Tolerance: Patient tolerated treatment well Patient left: in chair;with chair alarm set;with call bell/phone  within reach Nurse Communication: Mobility status PT Visit Diagnosis: Pain;Other abnormalities of gait and mobility (R26.89);Difficulty in walking, not elsewhere classified (R26.2) Pain - Right/Left: Right Pain - part of body: Leg     Time: 1325-1350 PT Time Calculation (min) (ACUTE ONLY): 25 min  Charges:  $Gait Training: 8-22 mins $Therapeutic Activity: 8-22 mins                    G Codes:       {Samon Dishner  PTA [facility]  Acute  Rehab Pager      [phone]

## 2017-07-21 NOTE — Telephone Encounter (Signed)
Left message for Pam questioning if Dr.Carter contacted her with response for encounter was still open, if not I relayed Dr.Carter's response and indicated we will continue to follow the patient once released from hospital.

## 2017-07-21 NOTE — Telephone Encounter (Signed)
I am unable to answer any questions regarding pt's care post discharge until she is actually discharged as her diagnosis and management options could change. Please let daughter know that we can contact her once the pt is discharged to discuss her concerns as more information will be available then.

## 2017-07-21 NOTE — Telephone Encounter (Signed)
Patient's daughter called asking to speak directly with Dr. Eulas Post about patient's care once she is released from hospital. She states that this is the second time she has asked to speak with Dr. Eulas Post and she would like a return call from her. Daughter stated that it has nothing to do with pt being in hospital it about her care after discharge.

## 2017-07-21 NOTE — Progress Notes (Addendum)
PROGRESS NOTE  DANNY ZIMNY AVW:[id number] DOB: [birth year] DOA: 07/11/2017 PCP: Gildardo Cranker, DO  Brief Summary:   pmh Mech Valve, PAf on coumadin; who presents with Rt Leg pain/numbness.  supratherapeutic inr >10 on presentation with large bruises on right thigh and arm. Patient also presents with acute urinary retention. Urine cultre + ecoli,   MRI reveals some irritation of the L5 nerve root.  Here for pain control. IR for ESI on 2/25.  Need d/c with foley, failed voiding trial, need urology follow up D/c to [facility] once urine culture result finalized     HPI/Recap of past 24 hours:  Post void residual 600 cc in the bladder , she denies urinary symptom. 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  She reports right lower back pain right lower leg pain has improved, right foot less numb.  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 Disc bulges at L4-5 and L5-S1." on ct ab/pel -Mri spine : "Mild multilevel degenerative changes throughout the lumbar spine as described above, worst at L4-L5 where there is mild central spinal canal stenosis and possible irritation of the descending right L5 nerve root. --ct right thigh no acute findings. - Due to persistent symptom with back pain and right lower extremity pain and numbness, case discussed with interventional radiology, patient underwent ESI on Monday once INR less than 1.5 - Coumadin held for [facility], resumed coumadin with lovenox bridging post procedure. -she is stared on on prednisone on admission, taper off steroids, last dose on 2/27. - increase neurontin as tolerated. Currently on 200mg  tid.  -patient  reports symptom is improving. -Case discussed with neurosurgery Dr. Trenton Gammon Who recommend follow-up in his office in 2 weeks  Leukocytosis, from steroids? Taper steroids, No fever. Continue to monitor sHe does have some cloudy urine in the Foley catheter,  urine culture + ecoli -she is started on rocephin, plan to treat with abx for total of 5 days, follow up urine c/s, adjust abx accordingly.   Acute urinary retention -Found to be be retaining 1.6 L of urine  on admission. -  foley inserted on admission, she is started on  flomax,  avoid constipation, start physical therapy -failed voiding trial on 2/24, post void residual 600cc, though she denies symptom, suspect chronic component of urinary retention - she is started on abx to cover ecoli -foley reinserted on 2/26, she needs to discharge with foley and follow up with urology in two weeks for voiding trial.   Metabolic Encephalopathy - Most likely secondary to medications (flexeril, tramadol, percocet, gabapentin) , pain, urinary retension -d/c flexeril and tramadol.  - pain control, Avoid constipation, foley placed for urinary retention -Daughter denies h/o CVA. 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 B12/folate level pending -much improved, alert,  aaox3, continue to have poor memory, outpatient follow up with neurology for memory impairment. 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  Acute blood loss anemia: with h/o chronic normocytic anemia -hgb 4.7 dropped on 2/20 from 9 from three days prior -No blood in the urine, she denies blood in stool, FOBT pending collection, no hemoptysis or hemetemesis -Ct ab /pel from 2/16 no intraabdomen bleed -She Does however has  large bruises in right thigh, right arm, ct right thigh no acute findings, she presented with INR>10 -prbc transfusionx2 on 2/20, hgb has been stable post transfusion -B12/folate level pending  Elevated total bilirubin: likely from large bruises,  trending down.    Supratherapeutic INR -INR 10 on presentation, she received vit k on presentation. -suspect patient cannot manage her own medications at home due to progressive memory issues, I have discussed with daughter over the phone who agrees nursing home placement - Coumadin  held with heparin drip for ESI, resumed coumadin with lovenox bridging post procedure. coumadin per pharmacy  H/o mechanical heart valve , PAF -  she is in afib in the hospital, Rate controlled on B blocker. Continue coumadin with lovenox bridging.     Hypertension/upper lipidemia : Continue metoprolol, Cozaar, statin    Code Status: DNR  Family Communication: patient in room, daughter [name] over the phone on 2/21 ,  2/23 and 2/26  Disposition Plan: previously lives alone Need to d/c to SNF once urine culture result finalized, need to d/c with foley   Consultants:  IR  Neurosurgery Dr Annette Stable  Procedures:  Foley placement  prbc transfusion x2 units on 2/20  ESI  on Monday 2/25 "Technically successful fluoroscopically guided right interlaminar ESI at L4-L5. 120 mg Depomedrol and 3 ml of 1% lidocaine were administered. No immediate complications"   Antibiotics:  Rocephin from 2/25   Objective: BP (!) 150/82   Pulse 74   Temp 98 F (36.7 C) (Oral)   Resp 16   Ht 5\' 3"  (1.6 m)   Wt 73.4 kg (161 lb 13.1 oz)   SpO2 99%   BMI 28.66 kg/m    Intake/Output Summary (Last 24 hours) at 07/21/2017 1333 Last data filed at 07/21/2017 0900 Gross per 24 hour  Intake 545.97 ml  Output 1075 ml  Net -529.03 ml   Filed Weights   07/13/17 8299  Weight: 73.4 kg (161 lb 13.1 oz)    Exam: Patient is examined daily including today on 07/21/2017, exams remain the same as of yesterday except that has changed    General: improving, alert , aaox3 , Impaired short term memory  Cardiovascular: RRR  Respiratory: CTABL  Abdomen: Soft/ND/NT, positive BS  Musculoskeletal: No Edema  Neuro: more  alert this am, she is aaox3 this am. Impaired short term memory  Skin: large bruises right thigh, right arm, resolving  Data Reviewed: Basic Metabolic Panel: Recent Labs  Lab 07/16/17 0520 07/17/17 0138 07/18/17 0014 07/20/17 0535 07/21/17 0528  NA 133* 130* 131* 131* 131*  K 4.5 4.4 4.2 3.7 4.2  CL 103 98* 96* 97* 99*  CO2 22 23 25 22 23   GLUCOSE 111* 110* 116* 90 119*  BUN 22* 15 15 19 18   CREATININE 0.68 0.59 0.55 0.63 0.59  CALCIUM 8.2* 8.2* 8.9 9.1 9.3  MG  --  2.1  --   --  1.9   Liver Function Tests: Recent Labs  Lab 07/15/17 1007 07/16/17 0520 07/17/17 0138 07/18/17 0014 07/21/17 0528  AST 44* 38 38 30 18  ALT 53 51 47 43 24  ALKPHOS 49 50 57 62 58  BILITOT 2.5* 2.9* 3.0* 3.9* 2.4*  PROT 5.7* 5.8* 5.8* 6.4* 6.3*  ALBUMIN 3.3* 3.3* 3.2* 3.7 3.4*   No results for input(s): LIPASE, AMYLASE in the last 168 hours. Recent Labs  Lab 07/16/17 0519  AMMONIA 31   CBC: Recent Labs  Lab 07/16/17 0520 07/17/17 0138 07/18/17 0014 07/19/17 0547  07/20/17 0535 07/21/17 0528  WBC 21.3* 23.4* 25.1* 28.2* 26.1* 27.1*  NEUTROABS 16.6*  --   --   --   --  23.0*  HGB 8.4* 8.5* 9.6* 10.0* 10.6* 10.4*  HCT 24.9* 25.4* 29.3* 30.6* 32.0* 31.2*  MCV 85.6 87.0 87.5 89.2 89.6 90.2  PLT 324 420* 352 374 332 332   Cardiac Enzymes:   No results for input(s): CKTOTAL, CKMB, CKMBINDEX, TROPONINI in the last 168 hours. BNP (last 3 results) No results for input(s): BNP in the last 8760 hours.  ProBNP (last 3 results) No results for input(s): PROBNP in the last 8760 hours.  CBG: No results for input(s): GLUCAP in the last 168 hours.  Recent Results (from the past 240 hour(s))  Culture, Urine     Status: Abnormal (Preliminary result)   Collection Time: 07/19/17 11:13 AM  Result Value Ref Range Status   Specimen Description   Final    URINE, RANDOM Performed at [place] [address], [place], [place] [address]    Special Requests   Final     NONE Performed at [facility], [place] [address] [place], [place] [address]    Culture (A)  Final    >=100,000 COLONIES/mL ESCHERICHIA COLI SUSCEPTIBILITIES TO FOLLOW Performed at [facility], [place] [address] [place], [place] [address]    Report Status PENDING  Incomplete  Culture, Urine     Status: Abnormal (Preliminary result)   Collection Time: 07/20/17  8:29 AM  Result Value Ref Range Status   Specimen Description   Final    URINE, CLEAN CATCH Performed at [facility], [place] [address] [place], [place] [address]    Special Requests   Final    NONE Performed at [facility], [place] [address] [place], [place] [address]    Culture (A)  Final    >=100,000 COLONIES/mL ESCHERICHIA COLI SUSCEPTIBILITIES TO FOLLOW Performed at [facility], [place] [address] [place],  [address]    Report Status PENDING  Incomplete     Studies: Ir Inject Diag/thera/inc Needle/cath/plc Epi/lumb/sac W/img  Result Date: 07/20/2017 CLINICAL DATA:  Lumbosacral spondylosis without myelopathy. Right leg radiculopathy. FLUOROSCOPY TIME:  Radiation Exposure Index (as provided by the fluoroscopic device): 5 mGy. Fluoroscopy Time:  0.2 minutes. Number of Acquired Images:  0 PROCEDURE: The procedure, risks, benefits, and alternatives were explained to the patient. Questions regarding the procedure were encouraged and answered. The patient understands and consents to the procedure. LUMBAR EPIDURAL INJECTION: An interlaminar approach was performed on right at L4-L5. The overlying skin was cleansed and anesthetized. A 3.5 inch 22 gauge epidural needle was advanced using loss-of-resistance technique. DIAGNOSTIC EPIDURAL INJECTION: Injection of Isovue-M 200 shows a good epidural pattern with spread above and below the level of needle placement, primarily on the right. No vascular opacification is seen. THERAPEUTIC EPIDURAL INJECTION: 120 mg of  Depo-Medrol mixed with 3 mL of 1% lidocaine were instilled. The procedure was well-tolerated, and the patient was discharged thirty minutes following the injection in good condition. COMPLICATIONS: None immediate. IMPRESSION: Technically successful epidural injection on the right at L4-L5 # 1. Electronically Signed   By: Titus Dubin M.D.   On: 07/20/2017 17:38    Scheduled Meds: . atorvastatin  10 mg Oral QHS  . enoxaparin (LOVENOX) injection  70 mg Subcutaneous Q12H  . gabapentin  200 mg Oral TID  . LORazepam  0.5 mg Intravenous Once  . losartan  100 mg Oral Daily  . metoprolol succinate  50 mg Oral Daily  . polyethylene glycol  17 g Oral BID  . [START ON 07/22/2017] predniSONE  10 mg Oral Q breakfast  . senna-docusate  1 tablet Oral BID  . tamsulosin  0.4 mg Oral Daily  . Warfarin - Pharmacist Dosing Inpatient   Does not apply q1800    Continuous Infusions: . cefTRIAXone (ROCEPHIN)  IV 1 g (07/21/17 1211)     Time spent: 35 mins, more than 50% time spent on coordination of care. I have personally reviewed and interpreted on  07/21/2017 daily labs, tele strips, imagings as discussed above under date review session and assessment and plans.  I reviewed all nursing notes, pharmacy notes, consultant notes,  vitals, pertinent old records  I have discussed plan of care as described above with RN , patient  on 07/21/2017   Florencia Reasons MD, PhD  [facility] Pager [phone]. If 7PM-7AM, please contact night-coverage at [email or web address], password [facility] 07/21/2017, 1:33 PM  LOS: 9 days

## 2017-07-21 NOTE — Progress Notes (Signed)
ANTICOAGULATION CONSULT NOTE - Follow Up Consult  Pharmacy Consult for Lovenox, Warfarin Indication: atrial fibrillation, mechanical mitral valve replacement  Allergies  Allergen Reactions  . Carvedilol Itching  . Ace Inhibitors Other (See Comments)    Reaction (??)  . Amlodipine Swelling  . Hydrochlorothiazide Other (See Comments)    Hyponatremia     Patient Measurements: Height: 5\' 3"  (160 cm) Weight: 161 lb 13.1 oz (73.4 kg) IBW/kg (Calculated) : 52.4  Heparin dosing weight: 68 kg  Vital Signs: Temp: 98 F (36.7 C) (02/26 0517) Temp Source: Oral (02/26 0517) BP: 149/85 (02/26 0517) Pulse Rate: 78 (02/26 0517)  Labs: Recent Labs    07/19/17 0547 07/20/17 0535 07/21/17 0528  HGB 10.0* 10.6* 10.4*  HCT 30.6* 32.0* 31.2*  PLT 374 332 332  LABPROT 19.6* 17.2* 15.1  INR 1.67 1.42 1.20  HEPARINUNFRC 0.65 0.41 0.44  CREATININE  --  0.63 0.59    Estimated Creatinine Clearance: 58.3 mL/min (by C-G formula based on SCr of 0.59 mg/dL).  Assessment: [name] present on 2/16 with R-sided sciatica.  She is on chronic warfarin therapy for atrial fibrillation and mitral valve replacement.  Her INR was >10 at time of presentation to ED. Pharmacy was consulted to manage warfarin.  Significant events: 1/7: Coumadin clinic: INR was therapeutic (2.5) on 5 mg daily 2/4: Coumadin clinic:  INR was supratherapeutic (6.9). Was instructed to hold x2 days and resume 5 mg daily 2/14: Presented to the ED for back pain; given 8 mg dexamethasone which could have contributed to her high INR at this admission 2/16: Admitted with INR >10; given 10 mg vit K PO 2/18: INR remained elevated; given another 10 mg dose of vit K PO 2/21 Warfarin canceled d/t IR procedure; Goal INR < 1.5.  Pharmacy is consulted to dose Heparin for bridging. 2/25 Successful epidural spinal injection; resume heparin 2 hrs after the procedure 2/26 Transition to Lovenox/ warfarin    07/21/2017   Heparin level therapeutic  at 0.44  INR decreased to 1.4, subtherapeutic  INR previously supratherapeutic.  Warfarin has been held x10 days except 1mg  given on 2/20  CBC: Hgb10.4, stable s/p transfusion on 2/20.  Plt WNL.  No bleeding or complications reported.  Pt had a large bruise on right thigh and arm, CT scan 2/20 showed no significant abnormality.  Drug-drug interactions: corticosteroids can potentiate warfarin's effects (as well as increase risk of bleeding); PO vitamin K may suppress INR for several days; ceftriaxone may prolong INR.  Diet:  Eating 75-100% of meals.  Goal of Therapy:  INR 2.5 - 3.5 (per warfarin clinic notes) Monitor platelets by anticoagulation protocol: Yes   Plan:   Stop heparin IV infusion at 0900  At 10:00, start Lovenox 70 mg SQ BID  Warfarin 2mg  PO today, give dose early at 12:00  Daily INR and CBC  Monitor for s/s bleeding, thrombosis.  For discharge, recommend warfarin 2mg  daily; recheck INR by Friday 3/1.  Gretta Arab PharmD, BCPS Pager [phone] 07/21/2017 7:05 AM

## 2017-07-21 NOTE — Progress Notes (Signed)
Dr. Erlinda Hong aware of Pt voiding 300 cloudy urine and Bladder Scan post void 697 ml. Foley Catheter inserted per new order for acute urinary retention.

## 2017-07-21 NOTE — Telephone Encounter (Signed)
I left a message for patient's daughter, [name], to call the office.

## 2017-07-22 ENCOUNTER — Telehealth: Payer: Self-pay | Admitting: Internal Medicine

## 2017-07-22 DIAGNOSIS — G4700 Insomnia, unspecified: Secondary | ICD-10-CM | POA: Diagnosis not present

## 2017-07-22 DIAGNOSIS — M4306 Spondylolysis, lumbar region: Secondary | ICD-10-CM | POA: Diagnosis not present

## 2017-07-22 DIAGNOSIS — E569 Vitamin deficiency, unspecified: Secondary | ICD-10-CM | POA: Diagnosis not present

## 2017-07-22 DIAGNOSIS — R339 Retention of urine, unspecified: Secondary | ICD-10-CM | POA: Diagnosis not present

## 2017-07-22 DIAGNOSIS — D500 Iron deficiency anemia secondary to blood loss (chronic): Secondary | ICD-10-CM | POA: Diagnosis not present

## 2017-07-22 DIAGNOSIS — K5900 Constipation, unspecified: Secondary | ICD-10-CM | POA: Diagnosis not present

## 2017-07-22 DIAGNOSIS — M6281 Muscle weakness (generalized): Secondary | ICD-10-CM | POA: Diagnosis not present

## 2017-07-22 DIAGNOSIS — Z6826 Body mass index (BMI) 26.0-26.9, adult: Secondary | ICD-10-CM | POA: Diagnosis not present

## 2017-07-22 DIAGNOSIS — N139 Obstructive and reflux uropathy, unspecified: Secondary | ICD-10-CM | POA: Diagnosis not present

## 2017-07-22 DIAGNOSIS — D72829 Elevated white blood cell count, unspecified: Secondary | ICD-10-CM | POA: Diagnosis not present

## 2017-07-22 DIAGNOSIS — M549 Dorsalgia, unspecified: Secondary | ICD-10-CM | POA: Diagnosis not present

## 2017-07-22 DIAGNOSIS — D6832 Hemorrhagic disorder due to extrinsic circulating anticoagulants: Secondary | ICD-10-CM | POA: Diagnosis not present

## 2017-07-22 DIAGNOSIS — G9341 Metabolic encephalopathy: Secondary | ICD-10-CM | POA: Diagnosis not present

## 2017-07-22 DIAGNOSIS — M5431 Sciatica, right side: Principal | ICD-10-CM

## 2017-07-22 DIAGNOSIS — R52 Pain, unspecified: Secondary | ICD-10-CM | POA: Diagnosis not present

## 2017-07-22 DIAGNOSIS — G8911 Acute pain due to trauma: Secondary | ICD-10-CM | POA: Diagnosis not present

## 2017-07-22 DIAGNOSIS — N390 Urinary tract infection, site not specified: Secondary | ICD-10-CM | POA: Diagnosis not present

## 2017-07-22 DIAGNOSIS — M4800 Spinal stenosis, site unspecified: Secondary | ICD-10-CM | POA: Diagnosis not present

## 2017-07-22 DIAGNOSIS — R338 Other retention of urine: Secondary | ICD-10-CM | POA: Diagnosis not present

## 2017-07-22 DIAGNOSIS — R2689 Other abnormalities of gait and mobility: Secondary | ICD-10-CM | POA: Diagnosis not present

## 2017-07-22 DIAGNOSIS — I10 Essential (primary) hypertension: Secondary | ICD-10-CM | POA: Diagnosis not present

## 2017-07-22 DIAGNOSIS — F5109 Other insomnia not due to a substance or known physiological condition: Secondary | ICD-10-CM | POA: Diagnosis not present

## 2017-07-22 DIAGNOSIS — I700 Atherosclerosis of aorta: Secondary | ICD-10-CM | POA: Diagnosis not present

## 2017-07-22 DIAGNOSIS — Z111 Encounter for screening for respiratory tuberculosis: Secondary | ICD-10-CM | POA: Diagnosis not present

## 2017-07-22 DIAGNOSIS — M79A21 Nontraumatic compartment syndrome of right lower extremity: Secondary | ICD-10-CM | POA: Diagnosis not present

## 2017-07-22 DIAGNOSIS — I4891 Unspecified atrial fibrillation: Secondary | ICD-10-CM | POA: Diagnosis not present

## 2017-07-22 DIAGNOSIS — F320 Major depressive disorder, single episode, mild: Secondary | ICD-10-CM | POA: Diagnosis not present

## 2017-07-22 DIAGNOSIS — E222 Syndrome of inappropriate secretion of antidiuretic hormone: Secondary | ICD-10-CM | POA: Diagnosis not present

## 2017-07-22 DIAGNOSIS — M62838 Other muscle spasm: Secondary | ICD-10-CM | POA: Diagnosis not present

## 2017-07-22 DIAGNOSIS — I480 Paroxysmal atrial fibrillation: Secondary | ICD-10-CM | POA: Diagnosis not present

## 2017-07-22 DIAGNOSIS — D649 Anemia, unspecified: Secondary | ICD-10-CM | POA: Diagnosis not present

## 2017-07-22 DIAGNOSIS — R278 Other lack of coordination: Secondary | ICD-10-CM | POA: Diagnosis not present

## 2017-07-22 DIAGNOSIS — F3342 Major depressive disorder, recurrent, in full remission: Secondary | ICD-10-CM | POA: Diagnosis not present

## 2017-07-22 DIAGNOSIS — R41841 Cognitive communication deficit: Secondary | ICD-10-CM | POA: Diagnosis not present

## 2017-07-22 LAB — CBC
HEMATOCRIT: 31.1 % — AB (ref 36.0–46.0)
Hemoglobin: 10.3 g/dL — ABNORMAL LOW (ref 12.0–15.0)
MCH: 30.1 pg (ref 26.0–34.0)
MCHC: 33.1 g/dL (ref 30.0–36.0)
MCV: 90.9 fL (ref 78.0–100.0)
PLATELETS: 332 10*3/uL (ref 150–400)
RBC: 3.42 MIL/uL — ABNORMAL LOW (ref 3.87–5.11)
RDW: 21.2 % — AB (ref 11.5–15.5)
WBC: 21.2 10*3/uL — AB (ref 4.0–10.5)

## 2017-07-22 LAB — URINE CULTURE: Culture: >=100000 — AB

## 2017-07-22 LAB — COMPREHENSIVE METABOLIC PANEL
ALBUMIN: 3.3 g/dL — AB (ref 3.5–5.0)
ALT: 31 U/L (ref 14–54)
AST: 25 U/L (ref 15–41)
Alkaline Phosphatase: 55 U/L (ref 38–126)
Anion gap: 8 (ref 5–15)
BILIRUBIN TOTAL: 1.9 mg/dL — AB (ref 0.3–1.2)
BUN: 16 mg/dL (ref 6–20)
CHLORIDE: 98 mmol/L — AB (ref 101–111)
CO2: 24 mmol/L (ref 22–32)
Calcium: 9.1 mg/dL (ref 8.9–10.3)
Creatinine, Ser: 0.6 mg/dL (ref 0.44–1.00)
GFR calc Af Amer: >60 mL/min (ref >60)
GFR calc non Af Amer: >60 mL/min (ref >60)
GLUCOSE: 112 mg/dL — AB (ref 65–99)
POTASSIUM: 4.6 mmol/L (ref 3.5–5.1)
Sodium: 130 mmol/L — ABNORMAL LOW (ref 135–145)
Total Protein: 6.1 g/dL — ABNORMAL LOW (ref 6.5–8.1)

## 2017-07-22 LAB — FOLATE: Folate: 8.8 ng/mL (ref >5.9)

## 2017-07-22 LAB — VITAMIN B12: Vitamin B-12: 614 pg/mL (ref 180–914)

## 2017-07-22 LAB — PROTIME-INR
INR: 1.08
Prothrombin Time: 13.9 seconds (ref 11.4–15.2)

## 2017-07-22 MED ORDER — CEPHALEXIN 500 MG PO CAPS: 500.0000 mg | ORAL_CAPSULE | Freq: Two times a day (BID) | ORAL | 0 refills | Status: AC

## 2017-07-22 MED ORDER — POLYETHYLENE GLYCOL 3350 17 G PO PACK: 17.0000 g | PACK | Freq: Two times a day (BID) | ORAL | 0 refills | Status: DC

## 2017-07-22 MED ORDER — TAMSULOSIN HCL 0.4 MG PO CAPS: 0.4000 mg | ORAL_CAPSULE | Freq: Every day | ORAL | 0 refills | Status: DC

## 2017-07-22 MED ORDER — ENOXAPARIN SODIUM 80 MG/0.8ML ~~LOC~~ SOLN: 70.0000 mg | Freq: Two times a day (BID) | SUBCUTANEOUS | 0 refills | Status: DC

## 2017-07-22 MED ORDER — HYDROCODONE-ACETAMINOPHEN 5-325 MG PO TABS: 1.0000 | ORAL_TABLET | Freq: Four times a day (QID) | ORAL | 0 refills | Status: DC | PRN

## 2017-07-22 MED ORDER — SENNOSIDES-DOCUSATE SODIUM 8.6-50 MG PO TABS: 1.0000 | ORAL_TABLET | Freq: Two times a day (BID) | ORAL | 0 refills | Status: DC

## 2017-07-22 MED ORDER — CEPHALEXIN 500 MG PO CAPS
500.0000 mg | ORAL_CAPSULE | Freq: Two times a day (BID) | ORAL | Status: DC
  Administered 2017-07-22: 500 mg via ORAL
  Filled 2017-07-22: qty 1

## 2017-07-22 MED ORDER — GABAPENTIN 100 MG PO CAPS: 200.0000 mg | ORAL_CAPSULE | Freq: Three times a day (TID) | ORAL | 0 refills | Status: DC

## 2017-07-22 NOTE — Care Management Important Message (Signed)
Important Message  Patient Details  Name: [name] MRN: [id number] Date of Birth: 12-11-1940   Medicare Important Message Given:  Yes    Kerin Salen 07/22/2017, 12:31 [place] Message  Patient Details  Name: [name] MRN: [id number] Date of Birth: 1940-06-22   Medicare Important Message Given:  Yes    Kerin Salen 07/22/2017, 12:31 PM

## 2017-07-22 NOTE — Discharge Summary (Addendum)
[facility] Discharge Summary   Patient: Jodi Frank:[id number]   PCP: Gildardo Cranker, DO DOB: 08/28/1940   Date of admission: 07/11/2017   Date of discharge:  07/22/2017    Discharge Diagnoses:  Active Problems:   Right sided sciatica   Supratherapeutic INR   Acute urinary retention   Leukocytosis   Sciatica   Admitted From: home Disposition:  SNF  Recommendations for Outpatient Follow-up:  1. Please follow up with PCP in 1 week 2. Please attempt voiding trial in 1 week and if fails pt will need to establish with urology   Follow-up Information    Gildardo Cranker, DO Follow up.   Specialty:  Internal Medicine Why:  pcp to refer to neurology for memory issues Contact information: [place] [address] [phone]        [facility] Follow up in 2 week(s).   Why:  for urinary retention, voiding trial, foley to stay in until seen by urology. Contact information: [place]       Earnie Larsson, MD Follow up in 2 week(s).   Specialty:  Neurosurgery Why:  Lumbar spondylosis Contact information: [address] [place] [address] [phone]          Diet recommendation: cardiac diet  Activity: The patient is advised to gradually reintroduce usual activities.  Discharge Condition: good  Code Status: DNR DNI  History of present illness: As per the H and P dictated on admission, "Jodi Frank is a 77 y.o. female with medical history significant of A. 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  Patient notes symptoms were initially mild, but over the course of the week became severe.  Describes it as a sharp pain radiating down her right leg.  Denies any recent falls,  heavy lifting, or traumas.  Seen by her PCP 4 days ago, and started on Norco and Zanaflex.  She was seen in the emergency department the following day reporting no relief of symptoms.  Patient was given 8 mg of Decadron and discharged home.  At baseline patient lives alone, and has not been able to care for herself adequately due to pain symptoms and difficulty ambulating.  Despite even doubling up doses of hydrocodone she reported no relief in pain symptoms. "  Hospital Course:  Summary of her active problems in the hospital is as following.  Right sided sciatica/right leg pain -"Lumbar spondylosis with multilevel moderate-to-marked disc space narrowing from T12-L1 more severely affecting L3-4. Disc bulges at L4-5 and L5-S1." on ct ab/pel -Mri spine : "Mild multilevel degenerative changes throughout the lumbar spine as described above, worst at L4-L5 where there is mild central spinal canal stenosis and possible irritation of the descending right L5 nerve root. --ct right thigh no acute findings. - Due to persistent symptom with back pain and right lower extremity pain and numbness, case discussed with interventional radiology, patient underwent ESI on Monday once INR less than 1.5 - Coumadin held for [facility], resumed coumadin with lovenox bridging post procedure. -she is stared on on prednisone on admission, taper off steroids, last dose on 2/27. - increase neurontin as tolerated. Currently on 200mg  tid.  -patient reports symptom is improving. -Case discussed with neurosurgery Dr. Trenton Gammon Who recommend follow-up in his office in 2 weeks  Leukocytosis, from steroids? Taper steroids, No fever. Continue to monitor sHe does have some cloudy urine in the Foley catheter,  urine culture + ecoli -she is started on rocephin, plan to treat with abx for total of 5 days, follow up urine c/s, adjust abx accordingly.  Acute urinary retention - Found to be be retaining 1.6 L of urine  on admission. - Foley  inserted on admission, she is started on  flomax,  avoid constipation, start physical therapy -failed voiding trial on 2/24, post void residual 600cc, though she denies symptom, suspect chronic component of urinary retention - she is started on abx to cover ecoli -foley reinserted on 2/26, she needs to discharge with foley and follow up with urology in two weeks for voiding trial.  Metabolic Encephalopathy - Most likely secondary to medications (flexeril, tramadol, percocet, gabapentin), pain, urinary retension - d/c flexeril and tramadol.  - pain control, Avoid constipation, foley placed for urinary retention - Daughter denies h/o CVA. 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 B12/folate level pending -much improved, alert,  aaox3, continue to have poor memory, outpatient follow up with neurology for memory impairment. 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  Acute blood loss anemia: with h/o chronic normocytic anemia -hgb 4.7 dropped on 2/20 from 9 from three days prior -No blood in the urine, she denies blood in stool, FOBT pending collection, no hemoptysis or hemetemesis -Ct ab /pel from 2/16 no intraabdomen bleed -She Does however has large bruises in right thigh, right arm, ct right thigh no acute findings, she presented with INR>10 -prbc transfusionx2 on 2/20, hgb has been stable post transfusion -B12/folate level pending  Elevated total bilirubin: likely from large bruises, trending down.  Supratherapeutic INR -INR 10 on presentation, she received vit k on presentation. -suspect patient cannot manage her own medications at home due to progressive memory issues, I have discussed with daughter over the phone who agrees nursing home placement -Coumadin  held with heparin drip for ESI, resumed coumadin with lovenox bridging post procedure. coumadin per pharmacy  H/o mechanical heart valve , PAF -she is in afib in the hospital, Rate controlled on B  blocker. Continue coumadin with lovenox bridging.   Hypertension/upper lipidemia : Continue metoprolol, Cozaar, statin   ecoli UTI Do not suspect associated with catheter, as present before foley insertion pansensistive organism, switch to oral keflex. 3 more days of Antibiotics   All other chronic medical condition were stable during the hospitalization.  Patient was seen by physical therapy, who recommended SNF, which was arranged by Education officer, museum and case Freight forwarder. On the day of the discharge the patient's vitals were stable, and no other acute medical condition were reported by patient. the patient was felt safe to be discharge at SNF with therapy.  Consultants:  IR  Neurosurgery Dr Annette Stable  Procedures:  Foley placement  prbc transfusion x2 units on 2/20  ESI  on Monday 2/25 "Technically successful fluoroscopically guided right interlaminar ESI at L4-L5. 120 mg Depomedrol and 3 ml of 1% lidocaine were administered. No immediate complications"  DISCHARGE MEDICATION: Allergies as of 07/22/2017      Reactions   Carvedilol Itching   Ace Inhibitors Other (See Comments)   Reaction (??)   Amlodipine Swelling   Hydrochlorothiazide Other (See Comments)   Hyponatremia      Medication List    TAKE these medications   acetaminophen 500 MG tablet Commonly known as:  TYLENOL Take 500-1,000 mg by mouth every 6 (six) hours  as needed.   alendronate 70 MG tablet Commonly known as:  FOSAMAX Take 1 tablet (70 mg total) by mouth every Sunday. Take with a full glass of water on an empty stomach.   atorvastatin 10 MG tablet Commonly known as:  LIPITOR Take 1 tablet (10 mg total) by mouth at bedtime.   cephALEXin 500 MG capsule Commonly known as:  KEFLEX Take 1 capsule (500 mg total) by mouth 2 (two) times daily for 3 days.   enoxaparin 80 MG/0.8ML injection Commonly known as:  LOVENOX Inject 0.7 mLs (70 mg total) into the skin every 12 (twelve) hours for 4 days.   gabapentin  100 MG capsule Commonly known as:  NEURONTIN Take 2 capsules (200 mg total) by mouth 3 (three) times daily.   HYDROcodone-acetaminophen 5-325 MG tablet Commonly known as:  NORCO Take 1 tablet by mouth every 6 (six) hours as needed for moderate pain or severe pain. May cut tablet in half   [place] 1 application topically 3 (three) times daily as needed (for pain).   losartan 100 MG tablet Commonly known as:  COZAAR Take 1 tablet (100 mg total) by mouth daily.   meclizine 25 MG tablet Commonly known as:  ANTIVERT Take 1 tablet (25 mg total) by mouth daily as needed for dizziness.   metoprolol succinate 50 MG 24 hr tablet Commonly known as:  TOPROL-XL Take 1 tablet (50 mg total) by mouth daily.   Omega-3 Fish Oil 1200 MG Caps Take 2 capsules (2,400 mg total) by mouth 2 (two) times daily.   polyethylene glycol packet Commonly known as:  MIRALAX / GLYCOLAX Take 17 g by mouth 2 (two) times daily.   senna-docusate 8.6-50 MG tablet Commonly known as:  Senokot-S Take 1 tablet by mouth 2 (two) times daily.   tamsulosin 0.4 MG Caps capsule Commonly known as:  FLOMAX Take 1 capsule (0.4 mg total) by mouth daily. Start taking on:  07/23/2017   tiZANidine 4 MG tablet Commonly known as:  ZANAFLEX Take 1 tablet (4 mg total) by mouth 3 (three) times daily as needed for muscle spasms. May cut tablet in half.   traZODone 50 MG tablet Commonly known as:  DESYREL Take 25-50 mg by mouth at bedtime as needed for sleep (1/2 to 1 tablet PRN).   Vitamin D3 1000 units Caps Take 1 capsule (1,000 Units total) by mouth daily.   warfarin 5 MG tablet Commonly known as:  COUMADIN Take as directed. If you are unsure how to take this medication, talk to your nurse or doctor. Original instructions:  Take 1 tablet (5 mg total) by mouth daily.      Allergies  Allergen Reactions  . Carvedilol Itching  . Ace Inhibitors Other (See Comments)    Reaction (??)  . Amlodipine Swelling  .  Hydrochlorothiazide Other (See Comments)    Hyponatremia    Discharge Instructions    Diet - low sodium heart healthy   Complete by:  As directed    Discharge instructions   Complete by:  As directed    It is important that you read following instructions as well as go over your medication list with RN to help you understand your care after this hospitalization.  Discharge Instructions: Please follow-up with PCP in one week  Please request your primary care physician to go over all Hospital Tests and Procedure/Radiological results at the follow up,  Please get all Hospital records sent to your PCP by signing hospital release  before you go home.   Do not take more than prescribed Pain, Sleep and Anxiety Medications. You were cared for by a hospitalist during your hospital stay. If you have any questions about your discharge medications or the care you received while you were in the hospital after you are discharged, you can call the unit and ask to speak with the hospitalist on call if the hospitalist that took care of you is not available.  Once you are discharged, your primary care physician will handle any further medical issues. Please note that NO REFILLS for any discharge medications will be authorized once you are discharged, as it is imperative that you return to your primary care physician (or establish a relationship with a primary care physician if you do not have one) for your aftercare needs so that they can reassess your need for medications and monitor your lab values. You Must read complete instructions/literature along with all the possible adverse reactions/side effects for all the Medicines you take and that have been prescribed to you. Take any new Medicines after you have completely understood and accept all the possible adverse reactions/side effects. Wear Seat belts while driving. If you have smoked or chewed Tobacco in the last 2 yrs please stop smoking and/or stop any  Recreational drug use.   Increase activity slowly   Complete by:  As directed      Discharge Exam: Filed Weights   07/13/17 0623  Weight: 73.4 kg (161 lb 13.1 oz)   Vitals:   07/21/17 2049 07/22/17 0543  BP: (!) 156/68 (!) 159/64  Pulse: 80 77  Resp: 18 20  Temp: 98 F (36.7 C) 98.9 F (37.2 C)  SpO2: 98% 96%   General: Appear in no distress, no Rash; Oral Mucosa moist. Cardiovascular: S1 and S2 Present, no Murmur, no JVD Respiratory: Bilateral Air entry present and Clear to Auscultation, no Crackles, no wheezes Abdomen: Bowel Sound present, Soft and no tenderness Extremities: no Pedal edema, no calf tenderness Neurology: Grossly no focal neuro deficit.  The results of significant diagnostics from this hospitalization (including imaging, microbiology, ancillary and laboratory) are listed below for reference.    Significant Diagnostic Studies: Ct Head Wo Contrast  Result Date: 07/18/2017 CLINICAL DATA:  History of stroke. 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 EXAM: CT HEAD WITHOUT CONTRAST TECHNIQUE: Contiguous axial images were obtained from the base of the skull through the vertex without intravenous contrast. COMPARISON:  05/05/2017 FINDINGS: Brain: Mild age related volume loss. Mild chronic small vessel ischemic changes of the cerebral hemispheric white matter. No sign of other focal infarction, mass lesion, hemorrhage, hydrocephalus or extra-axial collection. Vascular: There is atherosclerotic calcification of the major vessels at the base of the brain. Skull: No significant finding. Left frontal outer table exostotic prominence as seen previously. Sinuses/Orbits: Clear/normal Other: None IMPRESSION: No change. Mild chronic small-vessel ischemic change of the cerebral hemispheric white matter. Atherosclerotic calcification of the major vessels at the base of the brain. Electronically Signed   By: Nelson Chimes M.D.   On: 07/18/2017  11:10   Mr Lumbar Spine Wo Contrast  Result Date: 07/11/2017 CLINICAL DATA:  Back pain. EXAM: MRI LUMBAR SPINE WITHOUT CONTRAST TECHNIQUE: Multiplanar, multisequence MR imaging of the lumbar spine was performed. No intravenous contrast was administered. COMPARISON:  CT abdomen pelvis dated May 06, 2017. FINDINGS: Segmentation:  Standard. Alignment:  Trace anterolisthesis at L4-L5. Vertebrae: No fracture, evidence of discitis, or bone lesion. Degenerative type 2 Modic endplate  changes at L2-L3 and L3-L4. Type 1 Modic changes at L1-L2. Conus medullaris and cauda equina: Conus extends to the L1-L2 level. Conus and cauda equina appear normal. Paraspinal and other soft tissues: Negative.  Distended bladder. Disc levels: T10-T11: Only seen on the sagittal images.  Negative. T11-T12: Only seen on the sagittal images.  Negative. T12-L1:  Tiny disc bulge.  No stenosis. L1-L2: Small diffuse disc bulge and mild bilateral facet arthropathy. No stenosis. L2-L3: Small diffuse disc bulge and mild bilateral facet arthropathy. No stenosis. L3-L4: Small diffuse disc bulge and mild bilateral facet arthropathy. No stenosis. L4-L5: Diffuse disc bulge and moderate bilateral facet arthropathy. Prominent posterior epidural fat. Mild central spinal canal stenosis. Mild right lateral recess narrowing with possible irritation of the descending right L5 nerve root by the disc bulge. No neuroforaminal stenosis. L5-S1: Small diffuse disc bulge and mild bilateral facet arthropathy. No stenosis. IMPRESSION: 1. Mild multilevel degenerative changes throughout the lumbar spine as described above, worst at L4-L5 where there is mild central spinal canal stenosis and possible irritation of the descending right L5 nerve root. Electronically Signed   By: Titus Dubin M.D.   On: 07/11/2017 17:30   Ct Abdomen Pelvis W Contrast  Result Date: 07/11/2017 CLINICAL DATA:  Severe back pain with sciatica.  Difficulty voiding. EXAM: CT ABDOMEN AND  PELVIS WITH CONTRAST TECHNIQUE: Multidetector CT imaging of the abdomen and pelvis was performed using the standard protocol following bolus administration of intravenous contrast. CONTRAST:  187mL ISOVUE-300 IOPAMIDOL (ISOVUE-300) INJECTION 61% COMPARISON:  None. 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 Cardiomegaly without significant pericardial effusion or thickening. Aortic atherosclerosis of the included thoracic aorta. Mitral annular calcifications are present. Dependent atelectasis is seen at each lung base. Subsegmental atelectasis and/or scarring is also noted. Small hiatal hernia with minimal retained fluid in the distal esophagus. Hepatobiliary: Fatty infiltration about the falciform. Distended gallbladder without stones. No wall thickening. No enhancing mass lesions of the liver. No biliary dilatation is identified. Pancreas: Normal Spleen: Normal Adrenals/Urinary Tract: Bilateral small hypodensities within both kidneys compatible with cysts but too small to further characterize largest is posterior off the left kidney measuring 7 mm. No nephrolithiasis nor obstructive uropathy. Normal bilateral adrenal glands. Distended urinary bladder measuring 10.8 x 20 x 14 cm (volume = 1600 cm^3) without focal mural thickening or calculus. Stomach/Bowel: Stomach is within normal limits. Appendix appears normal. No evidence of bowel wall thickening, distention, or inflammatory changes. Vascular/Lymphatic: Aortic atherosclerosis. No enlarged abdominal or pelvic lymph nodes. Reproductive: Uterus and bilateral adnexa are unremarkable. Other: No abdominal wall hernia or abnormality. No abdominopelvic ascites. Musculoskeletal: Moderate to marked disc space narrowing T12 through L4 with severely affecting L3-4. Minimal grade 1 anterolisthesis of L4 on L5 likely on the basis of degenerative disc and facet arthropathy. Broad-based disc bulge L4-5 and L5-S1. No acute osseous  abnormality. IMPRESSION: 1. Marked distention of the urinary bladder with volume estimated at 1.6 L question neurogenic bladder. 2. 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 3. 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 Disc bulges at L4-5 and L5-S1. Electronically Signed   By: Ashley Royalty M.D.   On: 07/11/2017 21:49   Ct Femur Right Wo Contrast  Result Date: 07/15/2017 CLINICAL DATA:  Severe right leg pain. EXAM: CT OF THE LOWER RIGHT EXTREMITY WITHOUT CONTRAST TECHNIQUE: Multidetector CT imaging of the right thigh was performed according to the standard protocol. COMPARISON:  None FINDINGS:  Bones/Joint/Cartilage There is no fracture or dislocation or joint effusion. Minimal degenerative changes of the right hip joint and right knee. Muscles of the right thigh appear normal. Soft tissues Minimal nonspecific subcutaneous edema in the right thigh. Slight arterial vascular calcification around the knee. Incidental note is made of multiple air bubbles in the bladder. Has the patient had recent catheterization? If not, the possibility of anaerobic infection should be considered. IMPRESSION: 1. No significant abnormality of the right thigh. 2. Multiple air bubbles in the bladder as described. Electronically Signed   By: Lorriane Shire M.D.   On: 07/15/2017 09:49   Ir Inject Diag/thera/inc Needle/cath/plc Epi/lumb/sac W/img  Result Date: 07/20/2017 CLINICAL DATA:  Lumbosacral spondylosis without myelopathy. Right leg radiculopathy. FLUOROSCOPY TIME:  Radiation Exposure Index (as provided by the fluoroscopic device): 5 mGy. Fluoroscopy Time:  0.2 minutes. Number of Acquired Images:  0 PROCEDURE: The procedure, risks, benefits, and alternatives were explained to the patient. Questions regarding the procedure were encouraged and answered. The patient understands and consents to the procedure. LUMBAR EPIDURAL INJECTION: An  interlaminar approach was performed on right at L4-L5. The overlying skin was cleansed and anesthetized. A 3.5 inch 22 gauge epidural needle was advanced using loss-of-resistance technique. DIAGNOSTIC EPIDURAL INJECTION: Injection of Isovue-M 200 shows a good epidural pattern with spread above and below the level of needle placement, primarily on the right. No vascular opacification is seen. THERAPEUTIC EPIDURAL INJECTION: 120 mg of Depo-Medrol mixed with 3 mL of 1% lidocaine were instilled. The procedure was well-tolerated, and the patient was discharged thirty minutes following the injection in good condition. COMPLICATIONS: None immediate. IMPRESSION: Technically successful epidural injection on the right at L4-L5 # 1. Electronically Signed   By: Titus Dubin M.D.   On: 07/20/2017 17:38    Microbiology: Recent Results (from the past 240 hour(s))  Culture, Urine     Status: Abnormal   Collection Time: 07/19/17 11:13 AM  Result Value Ref Range Status   Specimen Description   Final    URINE, RANDOM Performed at [facility] [address], [place], [place] [address]    Special Requests   Final    NONE Performed at [facility], [place] [address], [place], [place] [address]    Culture >=100,000 COLONIES/mL ESCHERICHIA COLI (A)  Final   Report Status 07/22/2017 FINAL  Final   Organism ID, Bacteria ESCHERICHIA COLI (A)  Final      Susceptibility   Escherichia coli - MIC*    AMPICILLIN 8 SENSITIVE Sensitive     CEFAZOLIN <=4 SENSITIVE Sensitive     CEFTRIAXONE <=1 SENSITIVE Sensitive     CIPROFLOXACIN <=0.25 SENSITIVE Sensitive     GENTAMICIN <=1 SENSITIVE Sensitive     IMIPENEM <=0.25 SENSITIVE Sensitive     NITROFURANTOIN 32 SENSITIVE Sensitive     TRIMETH/SULFA <=20 SENSITIVE Sensitive     AMPICILLIN/SULBACTAM 4 SENSITIVE Sensitive     PIP/TAZO <=4 SENSITIVE Sensitive     Extended ESBL NEGATIVE Sensitive     * >=100,000 COLONIES/mL ESCHERICHIA  COLI  Culture, Urine     Status: Abnormal (Preliminary result)   Collection Time: 07/20/17  8:29 AM  Result Value Ref Range Status   Specimen Description   Final    URINE, CLEAN CATCH Performed at [facility], [place] [address], [place], [place] [address]    Special Requests   Final    NONE Performed at [facility], [address] [place], [place]  [address]    Culture (A)  Final    >=100,000 COLONIES/mL ESCHERICHIA COLI CULTURE REINCUBATED FOR BETTER GROWTH Performed at [facility], [place] [address], [place], [place] [address]    Report Status PENDING  Incomplete   Organism ID, Bacteria ESCHERICHIA COLI (A)  Final      Susceptibility   Escherichia coli - MIC*    AMPICILLIN 8 SENSITIVE Sensitive     CEFAZOLIN <=4 SENSITIVE Sensitive     CEFTRIAXONE <=1 SENSITIVE Sensitive     CIPROFLOXACIN <=0.25 SENSITIVE Sensitive     GENTAMICIN <=1 SENSITIVE Sensitive     IMIPENEM <=0.25 SENSITIVE Sensitive     NITROFURANTOIN 32 SENSITIVE Sensitive     TRIMETH/SULFA <=20 SENSITIVE Sensitive     AMPICILLIN/SULBACTAM 4 SENSITIVE Sensitive     PIP/TAZO <=4 SENSITIVE Sensitive     Extended ESBL NEGATIVE Sensitive     * >=100,000 COLONIES/mL ESCHERICHIA COLI     Labs: CBC: Recent Labs  Lab 07/16/17 0520  07/18/17 0014 07/19/17 0547 07/20/17 0535 07/21/17 0528 07/22/17 0520  WBC 21.3*   < > 25.1* 28.2* 26.1* 27.1* 21.2*  NEUTROABS 16.6*  --   --   --   --  23.0*  --   HGB 8.4*   < > 9.6* 10.0* 10.6* 10.4* 10.3*  HCT 24.9*   < > 29.3* 30.6* 32.0* 31.2* 31.1*  MCV 85.6   < > 87.5 89.2 89.6 90.2 90.9  PLT 324   < > 352 374 332 332 332   < > = values in this interval not displayed.   Basic Metabolic Panel: Recent Labs  Lab 07/17/17 0138 07/18/17 0014 07/20/17 0535 07/21/17 0528 07/22/17 0520  NA 130* 131* 131* 131* 130*  K 4.4 4.2 3.7 4.2 4.6  CL 98* 96* 97* 99* 98*  CO2 23 25 22 23 24   GLUCOSE 110* 116* 90 119* 112*  BUN 15 15 19 18  16   CREATININE 0.59 0.55 0.63 0.59 0.60  CALCIUM 8.2* 8.9 9.1 9.3 9.1  MG 2.1  --   --  1.9  --    Liver Function Tests: Recent Labs  Lab 07/16/17 0520 07/17/17 0138 07/18/17 0014 07/21/17 0528 07/22/17 0520  AST 38 38 30 18 25   ALT 51 47 43 24 31  ALKPHOS 50 57 62 58 55  BILITOT 2.9* 3.0* 3.9* 2.4* 1.9*  PROT 5.8* 5.8* 6.4* 6.3* 6.1*  ALBUMIN 3.3* 3.2* 3.7 3.4* 3.3*   No results for input(s): LIPASE, AMYLASE in the last 168 hours. Recent Labs  Lab 07/16/17 0519  AMMONIA 31   Cardiac Enzymes: No results for input(s): CKTOTAL, CKMB, CKMBINDEX, TROPONINI in the last 168 hours. BNP (last 3 results) No results for input(s): BNP in the last 8760 hours. CBG: No results for input(s): GLUCAP in the last 168 hours. Time spent: 35 minutes  Signed:  Berle Mull  [facility]  07/22/2017  , 10:47 AM

## 2017-07-22 NOTE — Telephone Encounter (Signed)
LM on VM to return call at office.   Pt was d/c'd today to SNF.

## 2017-07-22 NOTE — Care Management Note (Signed)
Case Management Note  Patient Details  Name: Jodi Frank MRN: [id number] Date of Birth: [birth year]  Subjective/Objective:                    Action/Plan:dc SNF   Expected Discharge Date:  07/22/17               Expected Discharge Plan:  Skilled Nursing Facility  In-House Referral:     Discharge planning Services  CM Consult  Post Acute Care Choice:    Choice offered to:     DME Arranged:    DME Agency:     HH Arranged:    [place] Agency:     Status of Service:  Completed, signed off  If discussed at [employer] of Stay Meetings, dates discussed:    Additional Comments:  Dessa Phi, RN 07/22/2017, 11:00 AM

## 2017-07-22 NOTE — Clinical Social Work Placement (Signed)
Patient received and accepted bed offer at [facility]. Facility aware of patient's discharge and confirmed bed offer. PTAR contacted, patient's family notified. Patient's RN can call report to [phone], packet complete. CSW signing off, no other needs identified at this time.  CLINICAL SOCIAL WORK PLACEMENT  NOTE  Date:  07/22/2017  Patient Details  Name: Jodi Frank MRN: [id number] Date of Birth: 1941-02-08  Clinical Social Work is seeking post-discharge placement for this patient at the [place] level of care (*CSW will initial, date and re-position this form in  chart as items are completed):  Yes   Patient/family provided with [place] Work Department's list of facilities offering this level of care within the geographic area requested by the patient (or if unable, by the patient's family).  Yes   Patient/family informed of their freedom to choose among providers that offer the needed level of care, that participate in Medicare, Medicaid or managed care program needed by the patient, have an available bed and are willing to accept the patient.  Yes   Patient/family informed of [facility]'s ownership interest in [facility] and [facility], as well as of the fact that they are under no obligation to receive care at these facilities.  PASRR submitted to EDS on       PASRR number received on       Existing PASRR number confirmed on 07/21/17     FL2 transmitted to all facilities in geographic area requested by pt/family on 07/21/17     FL2 transmitted to all facilities within larger geographic area on       Patient informed that his/her managed care company has contracts with or will negotiate with certain facilities, including the following:        Yes   Patient/family informed of bed offers received.  Patient chooses bed at [facility]     Physician recommends and patient chooses bed at      Patient to be transferred to  [facility] on 07/22/17.  Patient to be transferred to facility by PTAR     Patient family notified on 07/22/17 of transfer.  Name of family member notified:  Hilma Favors     PHYSICIAN       Additional Comment:    _______________________________________________ Burnis Medin, LCSW 07/22/2017, 11:38 AM

## 2017-07-22 NOTE — Progress Notes (Signed)
ANTICOAGULATION CONSULT NOTE - Follow Up Consult  Pharmacy Consult for Lovenox, Warfarin Indication: atrial fibrillation, mechanical mitral valve replacement  Allergies  Allergen Reactions  . Carvedilol Itching  . Ace Inhibitors Other (See Comments)    Reaction (??)  . Amlodipine Swelling  . Hydrochlorothiazide Other (See Comments)    Hyponatremia     Patient Measurements: Height: 5\' 3"  (160 cm) Weight: 161 lb 13.1 oz (73.4 kg) IBW/kg (Calculated) : 52.4  Heparin dosing weight: 68 kg  Vital Signs: Temp: 98.9 F (37.2 C) (02/27 0543) Temp Source: Oral (02/27 0543) BP: 159/64 (02/27 0543) Pulse Rate: 77 (02/27 0543)  Labs: Recent Labs    07/20/17 0535 07/21/17 0528 07/22/17 0520  HGB 10.6* 10.4* 10.3*  HCT 32.0* 31.2* 31.1*  PLT 332 332 332  LABPROT 17.2* 15.1 13.9  INR 1.42 1.20 1.08  HEPARINUNFRC 0.41 0.44  --   CREATININE 0.63 0.59 0.60    Estimated Creatinine Clearance: 58.3 mL/min (by C-G formula based on SCr of 0.6 mg/dL).  Assessment: [name] present on 2/16 with R-sided sciatica.  She is on chronic warfarin therapy for atrial fibrillation and mitral valve replacement.  Her INR was >10 at time of presentation to ED. Pharmacy was consulted to manage warfarin.  Significant events: 1/7: Coumadin clinic: INR was therapeutic (2.5) on 5 mg daily 2/4: Coumadin clinic:  INR was supratherapeutic (6.9). Was instructed to hold x2 days and resume 5 mg daily 2/14: Presented to the ED for back pain; given 8 mg dexamethasone which could have contributed to her high INR at this admission 2/16: Admitted with INR >10; given 10 mg vit K PO 2/18: INR remained elevated; given another 10 mg dose of vit K PO 2/21 Warfarin canceled d/t IR procedure; Goal INR < 1.5.  Pharmacy is consulted to dose Heparin for bridging. 2/25 Successful epidural spinal injection; resume heparin 2 hrs after the procedure 2/26 Transition to Lovenox/ warfarin    07/22/2017   INR decreased to 1.08,  subtherapeutic  INR previously supratherapeutic, > 10 for several days.  Will resume with lower more conservative dosing.  CBC: Hgb10.3, stable s/p transfusion on 2/20.  Plt WNL.  No bleeding or complications reported.  Pt had a large bruise on right thigh and arm, CT scan 2/20 showed no significant abnormality.  Drug-drug interactions: corticosteroids can potentiate warfarin's effects (as well as increase risk of bleeding); PO vitamin K may suppress INR for several days; ceftriaxone may prolong INR.  Diet:  Eating 75-100% of meals.  Goal of Therapy:  INR 2.5 - 3.5 (per warfarin clinic notes) Monitor platelets by anticoagulation protocol: Yes   Plan:   Continue Lovenox 70 mg SQ BID  Warfarin 2mg  PO today at 1800 if she remains inpatient.  Daily INR and CBC  Monitor for s/s bleeding, thrombosis.  For discharge, recommend warfarin 2mg  daily; recheck INR by Friday 3/1.  Gretta Arab PharmD, BCPS Pager [phone] 07/22/2017 11:39 AM

## 2017-07-22 NOTE — Telephone Encounter (Signed)
Patient's daughter, [name], called and stated that she was returning the call from Dr. Eulas Post this morning that she missed.   I informed of providers previous message about discussing pt care once pt is discharged and she stated that she understood that. She then stated that she needs to speak with Dr. Eulas Post about having pt speak with a psychiatrist and I informed her that a psychiatrist referral could not be placed because those visits must be initiated by the patient willingly. She stated she understood but still insisted on speaking with Dr. Eulas Post.

## 2017-07-23 DIAGNOSIS — R52 Pain, unspecified: Secondary | ICD-10-CM | POA: Diagnosis not present

## 2017-07-23 DIAGNOSIS — M6281 Muscle weakness (generalized): Secondary | ICD-10-CM | POA: Diagnosis not present

## 2017-07-23 DIAGNOSIS — M62838 Other muscle spasm: Secondary | ICD-10-CM | POA: Diagnosis not present

## 2017-07-23 DIAGNOSIS — M5431 Sciatica, right side: Secondary | ICD-10-CM | POA: Diagnosis not present

## 2017-07-23 DIAGNOSIS — N390 Urinary tract infection, site not specified: Secondary | ICD-10-CM | POA: Diagnosis not present

## 2017-07-23 DIAGNOSIS — R339 Retention of urine, unspecified: Secondary | ICD-10-CM | POA: Diagnosis not present

## 2017-07-23 DIAGNOSIS — G9341 Metabolic encephalopathy: Secondary | ICD-10-CM | POA: Diagnosis not present

## 2017-07-23 DIAGNOSIS — I480 Paroxysmal atrial fibrillation: Secondary | ICD-10-CM | POA: Diagnosis not present

## 2017-07-23 DIAGNOSIS — I10 Essential (primary) hypertension: Secondary | ICD-10-CM | POA: Diagnosis not present

## 2017-07-23 DIAGNOSIS — D500 Iron deficiency anemia secondary to blood loss (chronic): Secondary | ICD-10-CM | POA: Diagnosis not present

## 2017-07-23 DIAGNOSIS — D72829 Elevated white blood cell count, unspecified: Secondary | ICD-10-CM | POA: Diagnosis not present

## 2017-07-24 NOTE — Telephone Encounter (Signed)
S/w daughter by phone. Pt is now at [facility]. Daughter, Pam, c/a her medication mx due to memory loss. She would like neuropsychological testing when she is d/c'd from rehab. May need to switch her from coumadin to lovenox lifetime due to her ongoing drinking. She has an AVR and needs to keep INR 2.5-3.5. Daughter will contact office once pt is d/c'd from rehab. Note daughter lives in the [place], [place] area.

## 2017-07-27 DIAGNOSIS — M6281 Muscle weakness (generalized): Secondary | ICD-10-CM | POA: Diagnosis not present

## 2017-07-27 DIAGNOSIS — D500 Iron deficiency anemia secondary to blood loss (chronic): Secondary | ICD-10-CM | POA: Diagnosis not present

## 2017-07-27 DIAGNOSIS — R52 Pain, unspecified: Secondary | ICD-10-CM | POA: Diagnosis not present

## 2017-07-27 DIAGNOSIS — I10 Essential (primary) hypertension: Secondary | ICD-10-CM | POA: Diagnosis not present

## 2017-07-27 DIAGNOSIS — M5431 Sciatica, right side: Secondary | ICD-10-CM | POA: Diagnosis not present

## 2017-07-27 DIAGNOSIS — N390 Urinary tract infection, site not specified: Secondary | ICD-10-CM | POA: Diagnosis not present

## 2017-07-27 DIAGNOSIS — D72829 Elevated white blood cell count, unspecified: Secondary | ICD-10-CM | POA: Diagnosis not present

## 2017-07-27 DIAGNOSIS — G9341 Metabolic encephalopathy: Secondary | ICD-10-CM | POA: Diagnosis not present

## 2017-07-27 DIAGNOSIS — R339 Retention of urine, unspecified: Secondary | ICD-10-CM | POA: Diagnosis not present

## 2017-07-27 DIAGNOSIS — I480 Paroxysmal atrial fibrillation: Secondary | ICD-10-CM | POA: Diagnosis not present

## 2017-07-27 DIAGNOSIS — M62838 Other muscle spasm: Secondary | ICD-10-CM | POA: Diagnosis not present

## 2017-07-29 DIAGNOSIS — M4306 Spondylolysis, lumbar region: Secondary | ICD-10-CM | POA: Diagnosis not present

## 2017-07-29 DIAGNOSIS — R52 Pain, unspecified: Secondary | ICD-10-CM | POA: Diagnosis not present

## 2017-07-29 DIAGNOSIS — D72829 Elevated white blood cell count, unspecified: Secondary | ICD-10-CM | POA: Diagnosis not present

## 2017-07-29 DIAGNOSIS — R339 Retention of urine, unspecified: Secondary | ICD-10-CM | POA: Diagnosis not present

## 2017-07-29 DIAGNOSIS — M6281 Muscle weakness (generalized): Secondary | ICD-10-CM | POA: Diagnosis not present

## 2017-07-29 DIAGNOSIS — M62838 Other muscle spasm: Secondary | ICD-10-CM | POA: Diagnosis not present

## 2017-07-29 DIAGNOSIS — I10 Essential (primary) hypertension: Secondary | ICD-10-CM | POA: Diagnosis not present

## 2017-07-29 DIAGNOSIS — D500 Iron deficiency anemia secondary to blood loss (chronic): Secondary | ICD-10-CM | POA: Diagnosis not present

## 2017-07-29 DIAGNOSIS — G9341 Metabolic encephalopathy: Secondary | ICD-10-CM | POA: Diagnosis not present

## 2017-07-29 DIAGNOSIS — N390 Urinary tract infection, site not specified: Secondary | ICD-10-CM | POA: Diagnosis not present

## 2017-07-29 DIAGNOSIS — M5431 Sciatica, right side: Secondary | ICD-10-CM | POA: Diagnosis not present

## 2017-07-29 DIAGNOSIS — I480 Paroxysmal atrial fibrillation: Secondary | ICD-10-CM | POA: Diagnosis not present

## 2017-07-31 DIAGNOSIS — M6281 Muscle weakness (generalized): Secondary | ICD-10-CM | POA: Diagnosis not present

## 2017-07-31 DIAGNOSIS — I10 Essential (primary) hypertension: Secondary | ICD-10-CM | POA: Diagnosis not present

## 2017-07-31 DIAGNOSIS — M5431 Sciatica, right side: Secondary | ICD-10-CM | POA: Diagnosis not present

## 2017-07-31 DIAGNOSIS — G9341 Metabolic encephalopathy: Secondary | ICD-10-CM | POA: Diagnosis not present

## 2017-07-31 DIAGNOSIS — M62838 Other muscle spasm: Secondary | ICD-10-CM | POA: Diagnosis not present

## 2017-07-31 DIAGNOSIS — D500 Iron deficiency anemia secondary to blood loss (chronic): Secondary | ICD-10-CM | POA: Diagnosis not present

## 2017-07-31 DIAGNOSIS — D72829 Elevated white blood cell count, unspecified: Secondary | ICD-10-CM | POA: Diagnosis not present

## 2017-07-31 DIAGNOSIS — M4306 Spondylolysis, lumbar region: Secondary | ICD-10-CM | POA: Diagnosis not present

## 2017-07-31 DIAGNOSIS — N390 Urinary tract infection, site not specified: Secondary | ICD-10-CM | POA: Diagnosis not present

## 2017-07-31 DIAGNOSIS — R339 Retention of urine, unspecified: Secondary | ICD-10-CM | POA: Diagnosis not present

## 2017-07-31 DIAGNOSIS — R52 Pain, unspecified: Secondary | ICD-10-CM | POA: Diagnosis not present

## 2017-07-31 DIAGNOSIS — I480 Paroxysmal atrial fibrillation: Secondary | ICD-10-CM | POA: Diagnosis not present

## 2017-08-04 DIAGNOSIS — R338 Other retention of urine: Secondary | ICD-10-CM | POA: Diagnosis not present

## 2017-08-05 DIAGNOSIS — I10 Essential (primary) hypertension: Secondary | ICD-10-CM | POA: Diagnosis not present

## 2017-08-05 DIAGNOSIS — Z6826 Body mass index (BMI) 26.0-26.9, adult: Secondary | ICD-10-CM | POA: Diagnosis not present

## 2017-08-05 DIAGNOSIS — M79A21 Nontraumatic compartment syndrome of right lower extremity: Secondary | ICD-10-CM | POA: Diagnosis not present

## 2017-08-06 DIAGNOSIS — R339 Retention of urine, unspecified: Secondary | ICD-10-CM | POA: Diagnosis not present

## 2017-08-07 DIAGNOSIS — D500 Iron deficiency anemia secondary to blood loss (chronic): Secondary | ICD-10-CM | POA: Diagnosis not present

## 2017-08-07 DIAGNOSIS — R52 Pain, unspecified: Secondary | ICD-10-CM | POA: Diagnosis not present

## 2017-08-07 DIAGNOSIS — R339 Retention of urine, unspecified: Secondary | ICD-10-CM | POA: Diagnosis not present

## 2017-08-07 DIAGNOSIS — M4306 Spondylolysis, lumbar region: Secondary | ICD-10-CM | POA: Diagnosis not present

## 2017-08-07 DIAGNOSIS — D72829 Elevated white blood cell count, unspecified: Secondary | ICD-10-CM | POA: Diagnosis not present

## 2017-08-07 DIAGNOSIS — M6281 Muscle weakness (generalized): Secondary | ICD-10-CM | POA: Diagnosis not present

## 2017-08-07 DIAGNOSIS — M62838 Other muscle spasm: Secondary | ICD-10-CM | POA: Diagnosis not present

## 2017-08-07 DIAGNOSIS — G9341 Metabolic encephalopathy: Secondary | ICD-10-CM | POA: Diagnosis not present

## 2017-08-07 DIAGNOSIS — M5431 Sciatica, right side: Secondary | ICD-10-CM | POA: Diagnosis not present

## 2017-08-07 DIAGNOSIS — I480 Paroxysmal atrial fibrillation: Secondary | ICD-10-CM | POA: Diagnosis not present

## 2017-08-07 DIAGNOSIS — I10 Essential (primary) hypertension: Secondary | ICD-10-CM | POA: Diagnosis not present

## 2017-08-13 ENCOUNTER — Encounter: Payer: Self-pay | Admitting: Neurology

## 2017-08-14 DIAGNOSIS — I10 Essential (primary) hypertension: Secondary | ICD-10-CM | POA: Diagnosis not present

## 2017-08-14 DIAGNOSIS — R52 Pain, unspecified: Secondary | ICD-10-CM | POA: Diagnosis not present

## 2017-08-14 DIAGNOSIS — G9341 Metabolic encephalopathy: Secondary | ICD-10-CM | POA: Diagnosis not present

## 2017-08-14 DIAGNOSIS — M62838 Other muscle spasm: Secondary | ICD-10-CM | POA: Diagnosis not present

## 2017-08-14 DIAGNOSIS — M5431 Sciatica, right side: Secondary | ICD-10-CM | POA: Diagnosis not present

## 2017-08-14 DIAGNOSIS — M6281 Muscle weakness (generalized): Secondary | ICD-10-CM | POA: Diagnosis not present

## 2017-08-14 DIAGNOSIS — N390 Urinary tract infection, site not specified: Secondary | ICD-10-CM | POA: Diagnosis not present

## 2017-08-14 DIAGNOSIS — R339 Retention of urine, unspecified: Secondary | ICD-10-CM | POA: Diagnosis not present

## 2017-08-14 DIAGNOSIS — D500 Iron deficiency anemia secondary to blood loss (chronic): Secondary | ICD-10-CM | POA: Diagnosis not present

## 2017-08-14 DIAGNOSIS — I480 Paroxysmal atrial fibrillation: Secondary | ICD-10-CM | POA: Diagnosis not present

## 2017-08-14 DIAGNOSIS — D72829 Elevated white blood cell count, unspecified: Secondary | ICD-10-CM | POA: Diagnosis not present

## 2017-08-19 ENCOUNTER — Telehealth: Payer: Self-pay | Admitting: *Deleted

## 2017-08-19 ENCOUNTER — Encounter: Payer: Self-pay | Admitting: Nurse Practitioner

## 2017-08-19 ENCOUNTER — Ambulatory Visit (INDEPENDENT_AMBULATORY_CARE_PROVIDER_SITE_OTHER): Payer: Medicare Other | Admitting: Nurse Practitioner

## 2017-08-19 VITALS — BP 128/84 | HR 72 | Temp 98.4°F | Ht 63.0 in | Wt 156.0 lb

## 2017-08-19 DIAGNOSIS — R413 Other amnesia: Secondary | ICD-10-CM | POA: Diagnosis not present

## 2017-08-19 DIAGNOSIS — N3001 Acute cystitis with hematuria: Secondary | ICD-10-CM | POA: Diagnosis not present

## 2017-08-19 DIAGNOSIS — E785 Hyperlipidemia, unspecified: Secondary | ICD-10-CM | POA: Diagnosis not present

## 2017-08-19 DIAGNOSIS — R791 Abnormal coagulation profile: Secondary | ICD-10-CM | POA: Diagnosis not present

## 2017-08-19 DIAGNOSIS — R339 Retention of urine, unspecified: Secondary | ICD-10-CM | POA: Diagnosis not present

## 2017-08-19 DIAGNOSIS — Z952 Presence of prosthetic heart valve: Secondary | ICD-10-CM

## 2017-08-19 DIAGNOSIS — Z7901 Long term (current) use of anticoagulants: Secondary | ICD-10-CM

## 2017-08-19 DIAGNOSIS — E222 Syndrome of inappropriate secretion of antidiuretic hormone: Secondary | ICD-10-CM | POA: Diagnosis not present

## 2017-08-19 DIAGNOSIS — M4726 Other spondylosis with radiculopathy, lumbar region: Secondary | ICD-10-CM | POA: Diagnosis not present

## 2017-08-19 DIAGNOSIS — M5431 Sciatica, right side: Secondary | ICD-10-CM

## 2017-08-19 DIAGNOSIS — F329 Major depressive disorder, single episode, unspecified: Secondary | ICD-10-CM | POA: Diagnosis not present

## 2017-08-19 DIAGNOSIS — I10 Essential (primary) hypertension: Secondary | ICD-10-CM | POA: Diagnosis not present

## 2017-08-19 DIAGNOSIS — M48061 Spinal stenosis, lumbar region without neurogenic claudication: Secondary | ICD-10-CM | POA: Diagnosis not present

## 2017-08-19 LAB — POCT INR: INR: 4.1

## 2017-08-19 MED ORDER — GABAPENTIN 300 MG PO CAPS: 300.0000 mg | ORAL_CAPSULE | Freq: Three times a day (TID) | ORAL | 2 refills | Status: DC

## 2017-08-19 MED ORDER — TAMSULOSIN HCL 0.4 MG PO CAPS: 0.4000 mg | ORAL_CAPSULE | Freq: Every day | ORAL | 0 refills | Status: DC

## 2017-08-19 MED ORDER — WARFARIN SODIUM 4 MG PO TABS: 4.0000 mg | ORAL_TABLET | Freq: Every day | ORAL | 0 refills | Status: DC

## 2017-08-19 NOTE — Telephone Encounter (Signed)
Patient called and stated that she had an appointment today with Janett Billow. Patient is requesting a Bedside Commode and a Standard Walker Order faxed to [facility]. Please Advise.

## 2017-08-19 NOTE — Progress Notes (Signed)
Careteam: Patient Care Team: Gildardo Cranker, DO as PCP - General (Internal Medicine)  Advanced Directive information    Allergies  Allergen Reactions  . Carvedilol Itching  . Ace Inhibitors Other (See Comments)    Reaction (??)  . Amlodipine Swelling  . Hydrochlorothiazide Other (See Comments)    Hyponatremia     Chief Complaint  Patient presents with  . Acute Visit    Pt is being seen due to pain in right leg/foot. Pt has large bruise on back of right leg down to ankle.   . Other    [place] caregiver in room     HPI: Patient is a 77 y.o. female seen in the office today due to foot pain however lot of medication questions.   Questions about gabapentin- was getting during rehab but unsure if she needs to continue. 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 Disc bulges at L4-5 and L5-S1." pt noted to have numbness to right leg/foot which causes discomfort and pain to bottom of foot.   Started taking flomax after urinary retention during hospitalization. Question if she should continue this. Does not think she has a refill on this medication.   Only has 5 mg coumadin at home. Reports she was getting alternating 4 mg and 5 mg doses at rehab. Was discharged yesterday and took 5 mg tablet last night. Unsure what or when her last INR was. Called rehab facility to verify- INR yesterday was 3.8 at rehab, she had previously been on 4 mg DAILY since 3/22 INR 2.5 - 3.5 (per warfarin clinic notes)  Right foot has been bothering her for a while- ongoing since rehab ~1 month States her foot is numb- with hx of neuropathy and lumbar DDD  Has a lot of bruising due to elevated INR and rehab told her that it would took a long time to improve and this was making her foot pain worse.   Review of Systems:  Review of Systems  Constitutional: Negative for chills, diaphoresis and fever.    Respiratory: Negative for cough and shortness of breath.   Cardiovascular: Negative for chest pain and leg swelling.  Genitourinary: Positive for frequency. Negative for dysuria and urgency.  Musculoskeletal: Negative for falls and myalgias.  Skin: Negative for itching and rash.  Neurological: Positive for tingling and sensory change.  Endo/Heme/Allergies: Bruises/bleeds easily.    Past Medical History:  Diagnosis Date  . Broken hip ([place]) 10/2011   left  . Broken wrist   . Depression   . Diarrhea   . H/O long-term (current) use of anticoagulants   . High cholesterol   . Hypertension   . Memory difficulty 01/18/2016  . 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 Osteoporosis   . SIADH (syndrome of inappropriate ADH production) ([place])   . Vertigo 01/18/2016   Past Surgical History:  Procedure Laterality Date  . HEMATOMA EVACUATION Left 11/25/2012   Procedure: EVACUATION HEMATOMA;  Surgeon: Linna Hoff, MD;  Location: [facility];  Service: Orthopedics;  Laterality: Left;  . HIP PINNING,CANNULATED Left 11/03/2012   Procedure: CANNULATED HIP PINNING;  Surgeon: Tobi Bastos, MD;  Location: [facility];  Service: Orthopedics;  Laterality: Left;  . IR INJECT/THERA/INC NEEDLE/CATH/PLC EPI/LUMB/SAC W/IMG  07/20/2017  . 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  . OPEN REDUCTION INTERNAL FIXATION (ORIF) WRIST WITH ILIAC CREST BONE GRAFT Left 11/24/2012  Procedure: OPEN REDUCTION INTERNAL FIXATION LEFT WRIST POSSIBLE BONE GRAFTING AND PINNING (ORIF) ;  Surgeon: Linna Hoff, MD;  Location: [facility];  Service: Orthopedics;  Laterality: Left;  . SHOULDER OPEN ROTATOR CUFF REPAIR     Social History:   reports that she has never smoked. She has never used smokeless tobacco. She reports that she does not drink alcohol or use drugs.  Family History  Problem Relation Age of Onset  . Stroke Mother   . Hypertension Mother   . Stroke Father   . Colon cancer Paternal Grandmother   .  Parkinsonism Brother     Medications: Patient's Medications  New Prescriptions   No medications on file  Previous Medications   ACETAMINOPHEN (TYLENOL) 500 MG TABLET    Take 500-1,000 mg by mouth every 6 (six) hours as needed.   ALENDRONATE (FOSAMAX) 70 MG TABLET    Take 1 tablet (70 mg total) by mouth every Sunday. Take with a full glass of water on an empty stomach.   ATORVASTATIN (LIPITOR) 10 MG TABLET    Take 1 tablet (10 mg total) by mouth at bedtime.   CHOLECALCIFEROL (VITAMIN D3) 1000 UNITS CAPS    Take 1 capsule (1,000 Units total) by mouth daily.   GABAPENTIN (NEURONTIN) 100 MG CAPSULE    Take 2 capsules (200 mg total) by mouth 3 (three) times daily.   HYDROCODONE-ACETAMINOPHEN (NORCO) 5-325 MG TABLET    Take 1 tablet by mouth every 6 (six) hours as needed for moderate pain or severe pain. May cut tablet in half   LOSARTAN (COZAAR) 100 MG TABLET    Take 1 tablet (100 mg total) by mouth daily.   MECLIZINE (ANTIVERT) 25 MG TABLET    Take 1 tablet (25 mg total) by mouth daily as needed for dizziness.   MENTHOL, TOPICAL ANALGESIC, (ICY HOT EX)    Apply 1 application topically 3 (three) times daily as needed (for pain).   METOPROLOL SUCCINATE (TOPROL-XL) 50 MG 24 HR TABLET    Take 1 tablet (50 mg total) by mouth daily.   OMEGA-3 FATTY ACIDS (OMEGA-3 FISH OIL) 1200 MG CAPS    Take 2 capsules (2,400 mg total) by mouth 2 (two) times daily.   POLYETHYLENE GLYCOL (MIRALAX / GLYCOLAX) PACKET    Take 17 g by mouth 2 (two) times daily.   TAMSULOSIN (FLOMAX) 0.4 MG CAPS CAPSULE    Take 1 capsule (0.4 mg total) by mouth daily.   TIZANIDINE (ZANAFLEX) 4 MG TABLET    Take 1 tablet (4 mg total) by mouth 3 (three) times daily as needed for muscle spasms. May cut tablet in half.   TRAZODONE (DESYREL) 50 MG TABLET    Take 25-50 mg by mouth at bedtime as needed for sleep (1/2 to 1 tablet PRN).    WARFARIN (COUMADIN) 5 MG TABLET    Take 1 tablet (5 mg total) by mouth daily.  Modified Medications   No  medications on file  Discontinued Medications   ENOXAPARIN (LOVENOX) 80 MG/0.8ML INJECTION    Inject 0.7 mLs (70 mg total) into the skin every 12 (twelve) hours for 4 days.   SENNA-DOCUSATE (SENOKOT-S) 8.6-50 MG TABLET    Take 1 tablet by mouth 2 (two) times daily.     Physical Exam:  Vitals:   08/19/17 1049  BP: 128/84  Pulse: 72  Temp: 98.4 F (36.9 C)  TempSrc: Oral  SpO2: 99%  Weight: 156 lb (70.8 kg)  Height: 5\' 3"  (1.6 m)  Body mass index is 27.63 kg/m.  Physical Exam  Constitutional: She is oriented to person, place, and time. She appears well-developed and well-nourished.  HENT:  Head: Normocephalic and atraumatic.  Right Ear: External ear normal.  Left Ear: External ear normal.  Eyes: Pupils are equal, round, and reactive to light. Conjunctivae and EOM are normal.  Neck: Normal range of motion. Neck supple.  Cardiovascular: Normal rate, regular rhythm and normal heart sounds.  Pulmonary/Chest: Effort normal and breath sounds normal.  Abdominal: Soft. Bowel sounds are normal.  Musculoskeletal: Normal range of motion. She exhibits edema and tenderness.  Edema noted to right leg with ecchymosis along calf and shin Tenderness to leg and plantar surface of right foot.   Neurological: She is alert and oriented to person, place, and time.  Skin: Skin is warm and dry.  Psychiatric: She has a normal mood and affect.    Labs reviewed: Basic Metabolic Panel: Recent Labs    02/27/17  07/17/17 0138  07/20/17 0535 07/21/17 0528 07/22/17 0520  NA 130*   < > 130*   < > 131* 131* 130*  K 4.8   < > 4.4   < > 3.7 4.2 4.6  CL  --    < > 98*   < > 97* 99* 98*  CO2  --    < > 23   < > 22 23 24   GLUCOSE  --    < > 110*   < > 90 119* 112*  BUN 12   < > 15   < > 19 18 16   CREATININE 0.7   < > 0.59   < > 0.63 0.59 0.60  CALCIUM  --    < > 8.2*   < > 9.1 9.3 9.1  MG  --   --  2.1  --   --  1.9  --   TSH 0.62  --   --   --   --   --   --    < > = values in this interval not  displayed.   Liver Function Tests: Recent Labs    07/18/17 0014 07/21/17 0528 07/22/17 0520  AST 30 18 25   ALT 43 24 31  ALKPHOS 62 58 55  BILITOT 3.9* 2.4* 1.9*  PROT 6.4* 6.3* 6.1*  ALBUMIN 3.7 3.4* 3.3*   No results for input(s): LIPASE, AMYLASE in the last 8760 hours. Recent Labs    07/16/17 0519  AMMONIA 31   CBC: Recent Labs    07/11/17 1745  07/16/17 0520  07/20/17 0535 07/21/17 0528 07/22/17 0520  WBC 16.4*   < > 21.3*   < > 26.1* 27.1* 21.2*  NEUTROABS 14.4*  --  16.6*  --   --  23.0*  --   HGB 11.0*   < > 8.4*   < > 10.6* 10.4* 10.3*  HCT 32.9*   < > 24.9*   < > 32.0* 31.2* 31.1*  MCV 79.7   < > 85.6   < > 89.6 90.2 90.9  PLT 414*   < > 324   < > 332 332 332   < > = values in this interval not displayed.   Lipid Panel: Recent Labs    02/27/17  CHOL 192  HDL 84*  LDLCALC 96  TRIG 62   TSH: Recent Labs    02/27/17  TSH 0.62   A1C: No results found for: HGBA1C   Assessment/Plan 1. Supratherapeutic INR INR 4.1, recent hospitalization  due to supratheraputic INR, tpok 5 mg last night, INR 3.8 prior to leaving facility now 4.1. will HOLD today and tomorrow and restart at 4 mg daily  - POC INR  2. Long term (current) use of anticoagulants - POC INR- 4.1, see number 1 - warfarin (COUMADIN) 4 MG tablet; Take 1 tablet (4 mg total) by mouth daily.  Dispense: 30 tablet; Refill: 0  3. H/O mitral valve replacement with mechanical valve - INR goal 2.5-3.5 - warfarin (COUMADIN) 4 MG tablet; Take 1 tablet (4 mg total) by mouth daily.  Dispense: 30 tablet; Refill: 0  4. Right sided sciatica -ongoing, will increase gabapentin to 300 mg TID  - gabapentin (NEURONTIN) 300 MG capsule; Take 1 capsule (300 mg total) by mouth 3 (three) times daily.  Dispense: 30 capsule; Refill: 2  5. Urinary retention -improved with flomax, to continue at this time.  - tamsulosin (FLOMAX) 0.4 MG CAPS capsule; Take 1 capsule (0.4 mg total) by mouth daily.  Dispense: 30 capsule;  Refill: 0  Next appt: 08/24/2017 for INR follow up  [place]. [place], [facility] [phone]

## 2017-08-19 NOTE — Patient Instructions (Addendum)
STOP COUMADIN 5 mg daily HOLD coumadin today and tomorrow RESTART COUMADIN 4 mg on Friday.   Increase gabapentin/neurotin to 1 tablet- 300 mg by mouth three times daily Can use 3 of the 100 mg tablets until you run out  TO Jodi Frank on Monday 08/24/17 for INR check

## 2017-08-19 NOTE — Telephone Encounter (Signed)
Patient stated that she called the [facility] and they told her that her PCP would write those, not them.

## 2017-08-19 NOTE — Telephone Encounter (Signed)
Is home health coming out to her house? Have them evaluate her for need and we will write order if needed

## 2017-08-19 NOTE — Telephone Encounter (Signed)
This should have been given from her rehab facility

## 2017-08-20 NOTE — Telephone Encounter (Signed)
Okay have them send over any request with reasoning and we can provide orders

## 2017-08-20 NOTE — Telephone Encounter (Signed)
Patient only has a Actuary from [facility] that comes to the home to assist with daily routine.

## 2017-08-20 NOTE — Telephone Encounter (Signed)
Noted, this was not evaluated during OV, we will have to bring her back to office to proper document for this that is also why I recommended them getting order from rehab.

## 2017-08-20 NOTE — Telephone Encounter (Signed)
I spoke with [facility] Caregiver and they do not assess patient, they only sit with patient on a daily basis and help with daily chores.

## 2017-08-21 ENCOUNTER — Ambulatory Visit (INDEPENDENT_AMBULATORY_CARE_PROVIDER_SITE_OTHER): Payer: Medicare Other | Admitting: Internal Medicine

## 2017-08-21 ENCOUNTER — Other Ambulatory Visit: Payer: Self-pay | Admitting: Internal Medicine

## 2017-08-21 ENCOUNTER — Encounter: Payer: Self-pay | Admitting: Internal Medicine

## 2017-08-21 VITALS — BP 120/70 | HR 86 | Temp 98.0°F | Ht 63.0 in | Wt 154.0 lb

## 2017-08-21 DIAGNOSIS — M7989 Other specified soft tissue disorders: Secondary | ICD-10-CM | POA: Diagnosis not present

## 2017-08-21 DIAGNOSIS — Z7901 Long term (current) use of anticoagulants: Secondary | ICD-10-CM

## 2017-08-21 DIAGNOSIS — S8011XA Contusion of right lower leg, initial encounter: Secondary | ICD-10-CM | POA: Diagnosis not present

## 2017-08-21 DIAGNOSIS — Z952 Presence of prosthetic heart valve: Secondary | ICD-10-CM

## 2017-08-21 DIAGNOSIS — N3001 Acute cystitis with hematuria: Secondary | ICD-10-CM

## 2017-08-21 DIAGNOSIS — R2681 Unsteadiness on feet: Secondary | ICD-10-CM | POA: Diagnosis not present

## 2017-08-21 DIAGNOSIS — M48061 Spinal stenosis, lumbar region without neurogenic claudication: Secondary | ICD-10-CM | POA: Diagnosis not present

## 2017-08-21 LAB — POCT URINALYSIS DIPSTICK
Bilirubin, UA: NEGATIVE
GLUCOSE UA: NEGATIVE
KETONES UA: 5
Nitrite, UA: POSITIVE
Odor: POSITIVE
SPEC GRAV UA: 1.025 (ref 1.010–1.025)
Urobilinogen, UA: 0.2 E.U./dL
pH, UA: 7.5 (ref 5.0–8.0)

## 2017-08-21 MED ORDER — WALKER MISC: 0 refills | Status: DC

## 2017-08-21 MED ORDER — ELONGATED TOILET SEAT ELEVATOR MISC: 0 refills | Status: DC

## 2017-08-21 MED ORDER — CEPHALEXIN 500 MG PO CAPS: 500.0000 mg | ORAL_CAPSULE | Freq: Two times a day (BID) | ORAL | 0 refills | Status: DC

## 2017-08-21 NOTE — Progress Notes (Signed)
Patient ID: Jodi Frank, female   DOB: 05-06-1941, 77 y.o.   MRN: [id number]   [facility] OFFICE  Provider: DR Arletha Grippe  Code Status:  Goals of Care:  Advanced Directives 07/12/2017  Does Patient Have a Medical Advance Directive? Yes  Type of Advance Directive Living will  Does patient want to make changes to medical advance directive? No - Patient declined  Copy of [place] in Chart? -  Would patient like information on creating a medical advance directive? -  Pre-existing out of facility DNR order (yellow form or pink MOST form) -     Chief Complaint  Patient presents with  . Acute Visit    Right foot concerns x 1 month, patient went to rehab with no improvement. Patient also c/o right foot numbness and tingling   . Medication Management    Discuss alternative to Coumadin  . Skin Problem    Extremly dry skin  . DME    Discuss order for walker, toilet lift    HPI: Patient is a 77 y.o. female seen today for an acute visit for right foot pain and swelling x 3 days. She notes tingling when foot flat on the floor. She notes foot twitches when she is in bed. She was admitted in Feb 2019 for right sciatica and leukocytosis. She was d/c'd to short term rehab x 4 weeks. She returned home from rehab 3 days ago and was to start home therapy today. Pain was so unbearable that she came to office to be seen vs starting tx. MRI L spine during hospital stay revealed arthritic changes, bulging discs and mild spinal stenosis at right L5 nerve root. She needs DME wheeled standard walker and elongated toilet seat.  She is c/a large posterior right leg bruise present since Feb hospital admission. Bruise nontraumatic. She applied herbal supplement clled Arnica (per webMD it is an anti-inflammatory). Her INR was supratherapeutic at hospital admission (>10).  INR 4.1 08/19/17. She is on coumadin for MVR. GOAL INR 2.5-3.5.   She has increased urinary frequency with decreased urine output  x several weeks. No dysuria or hematuria. No abdominal pain, N/V. No f/c.  Pt is a poor historian due to memory loss. Hx obtained from chart and caregiver who is present  Past Medical History:  Diagnosis Date  . Broken hip ([place]) 10/2011   left  . Broken wrist   . Depression   . Diarrhea   . H/O long-term (current) use of anticoagulants   . High cholesterol   . Hypertension   . Memory difficulty 01/18/2016  . 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 Osteoporosis   . SIADH (syndrome of inappropriate ADH production) ([place])   . Vertigo 01/18/2016    Past Surgical History:  Procedure Laterality Date  . HEMATOMA EVACUATION Left 11/25/2012   Procedure: EVACUATION HEMATOMA;  Surgeon: Linna Hoff, MD;  Location: [facility];  Service: Orthopedics;  Laterality: Left;  . HIP PINNING,CANNULATED Left 11/03/2012   Procedure: CANNULATED HIP PINNING;  Surgeon: Tobi Bastos, MD;  Location: [facility];  Service: Orthopedics;  Laterality: Left;  . IR INJECT/THERA/INC NEEDLE/CATH/PLC EPI/LUMB/SAC W/IMG  07/20/2017  . 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  . OPEN REDUCTION INTERNAL FIXATION (ORIF) WRIST WITH ILIAC CREST BONE GRAFT Left 11/24/2012   Procedure: OPEN REDUCTION INTERNAL FIXATION LEFT WRIST POSSIBLE BONE GRAFTING AND PINNING (ORIF) ;  Surgeon: Linna Hoff, MD;  Location: [facility]  OR;  Service: Orthopedics;  Laterality: Left;  . SHOULDER OPEN ROTATOR CUFF REPAIR       reports that she has never smoked. She has never used smokeless tobacco. She reports that she does not drink alcohol or use drugs. Social History   Socioeconomic History  . Marital status: Divorced    Spouse name: Not on file  . Number of children: 1  . Years of education: BS  . Highest education level: Not on file  Occupational History  . Occupation: Retired  Scientific laboratory technician  . Financial resource strain: Not on file  . Food insecurity:    Worry: Not on file    Inability: Not on file  . Transportation needs:     Medical: Not on file    Non-medical: Not on file  Tobacco Use  . Smoking status: Never Smoker  . Smokeless tobacco: Never Used  Substance and Sexual Activity  . Alcohol use: No    Alcohol/week: 2.4 oz    Types: 4 Glasses of wine per week    Frequency: Never  . Drug use: No  . Sexual activity: Not Currently  Lifestyle  . Physical activity:    Days per week: Not on file    Minutes per session: Not on file  . Stress: Not on file  Relationships  . Social connections:    Talks on phone: Not on file    Gets together: Not on file    Attends religious service: Not on file    Active member of club or organization: Not on file    Attends meetings of clubs or organizations: Not on file    Relationship status: Not on file  . Intimate partner violence:    Fear of current or ex partner: Not on file    Emotionally abused: Not on file    Physically abused: Not on file    Forced sexual activity: Not on file  Other Topics Concern  . Not on file  Social History Narrative   Lives at home, alone   Right-handed   Caffeine: 2 cups per day    Family History  Problem Relation Age of Onset  . Stroke Mother   . Hypertension Mother   . Stroke Father   . Colon cancer Paternal Grandmother   . Parkinsonism Brother     Allergies  Allergen Reactions  . Carvedilol Itching  . Ace Inhibitors Other (See Comments)    Reaction (??)  . Amlodipine Swelling  . Hydrochlorothiazide Other (See Comments)    Hyponatremia     Outpatient Encounter Medications as of 08/21/2017  Medication Sig  . acetaminophen (TYLENOL) 500 MG tablet Take 500-1,000 mg by mouth every 6 (six) hours as needed.  Marland Kitchen alendronate (FOSAMAX) 70 MG tablet Take 1 tablet (70 mg total) by mouth every Sunday. Take with a full glass of water on an empty stomach.  Marland Kitchen atorvastatin (LIPITOR) 10 MG tablet Take 1 tablet (10 mg total) by mouth at bedtime.  . Cholecalciferol (VITAMIN D3) 1000 units CAPS Take 1 capsule (1,000 Units total) by  mouth daily.  Marland Kitchen gabapentin (NEURONTIN) 300 MG capsule Take 1 capsule (300 mg total) by mouth 3 (three) times daily.  Marland Kitchen HYDROcodone-acetaminophen (NORCO) 5-325 MG tablet Take 1 tablet by mouth every 6 (six) hours as needed for moderate pain or severe pain. May cut tablet in half  . losartan (COZAAR) 100 MG tablet Take 1 tablet (100 mg total) by mouth daily.  . meclizine (ANTIVERT) 25 MG tablet Take  1 tablet (25 mg total) by mouth daily as needed for dizziness.  . metoprolol succinate (TOPROL-XL) 50 MG 24 hr tablet Take 1 tablet (50 mg total) by mouth daily.  . Omega-3 Fatty Acids (OMEGA-3 FISH OIL) 1200 MG CAPS Take 2 capsules (2,400 mg total) by mouth 2 (two) times daily.  . tamsulosin (FLOMAX) 0.4 MG CAPS capsule Take 1 capsule (0.4 mg total) by mouth daily.  Marland Kitchen tiZANidine (ZANAFLEX) 4 MG tablet Take 1 tablet (4 mg total) by mouth 3 (three) times daily as needed for muscle spasms. May cut tablet in half.  . traZODone (DESYREL) 50 MG tablet Take 25-50 mg by mouth at bedtime as needed for sleep (1/2 to 1 tablet PRN).   Marland Kitchen warfarin (COUMADIN) 4 MG tablet Take 1 tablet (4 mg total) by mouth daily.  . [DISCONTINUED] Menthol, Topical Analgesic, (ICY HOT EX) Apply 1 application topically 3 (three) times daily as needed (for pain).  . [DISCONTINUED] polyethylene glycol (MIRALAX / GLYCOLAX) packet Take 17 g by mouth 2 (two) times daily.   No facility-administered encounter medications on file as of 08/21/2017.     Review of Systems:  Review of Systems  Unable to perform ROS: Other (memory  loss)    Health Maintenance  Topic Date Due  . TETANUS/TDAP  07/10/2015  . COLONOSCOPY  09/20/2015  . INFLUENZA VACCINE  Completed  . DEXA SCAN  Completed  . PNA vac Low Risk Adult  Completed    Physical Exam: Vitals:   08/21/17 1228  BP: 120/70  Pulse: 86  Temp: 98 F (36.7 C)  TempSrc: Oral  SpO2: 97%  Weight: 154 lb (69.9 kg)  Height: 5\' 3"  (1.6 m)   Body mass index is 27.28 kg/m. Physical Exam   Constitutional: She appears well-developed and well-nourished.  Cardiovascular:  +1 pitting RLE edema with no LLE edema. (+) right calf TTP and swelling but no palpable cord; no left calf TTP  Musculoskeletal: She exhibits edema and tenderness.  Neurological: She is alert.  Skin:  Large purplish appearing contusion posterior right leg --->foot with distal swelling and TTP; pt also reports smaller contusions on right thigh  Psychiatric: She has a normal mood and affect. Her behavior is normal. Thought content normal.    Labs reviewed: Basic Metabolic Panel: Recent Labs    02/27/17  07/17/17 0138  07/20/17 0535 07/21/17 0528 07/22/17 0520  NA 130*   < > 130*   < > 131* 131* 130*  K 4.8   < > 4.4   < > 3.7 4.2 4.6  CL  --    < > 98*   < > 97* 99* 98*  CO2  --    < > 23   < > 22 23 24   GLUCOSE  --    < > 110*   < > 90 119* 112*  BUN 12   < > 15   < > 19 18 16   CREATININE 0.7   < > 0.59   < > 0.63 0.59 0.60  CALCIUM  --    < > 8.2*   < > 9.1 9.3 9.1  MG  --   --  2.1  --   --  1.9  --   TSH 0.62  --   --   --   --   --   --    < > = values in this interval not displayed.   Liver Function Tests: Recent Labs    07/18/17 0014  07/21/17 0528 07/22/17 0520  AST 30 18 25   ALT 43 24 31  ALKPHOS 62 58 55  BILITOT 3.9* 2.4* 1.9*  PROT 6.4* 6.3* 6.1*  ALBUMIN 3.7 3.4* 3.3*   No results for input(s): LIPASE, AMYLASE in the last 8760 hours. Recent Labs    07/16/17 0519  AMMONIA 31   CBC: Recent Labs    07/11/17 1745  07/16/17 0520  07/20/17 0535 07/21/17 0528 07/22/17 0520  WBC 16.4*   < > 21.3*   < > 26.1* 27.1* 21.2*  NEUTROABS 14.4*  --  16.6*  --   --  23.0*  --   HGB 11.0*   < > 8.4*   < > 10.6* 10.4* 10.3*  HCT 32.9*   < > 24.9*   < > 32.0* 31.2* 31.1*  MCV 79.7   < > 85.6   < > 89.6 90.2 90.9  PLT 414*   < > 324   < > 332 332 332   < > = values in this interval not displayed.   Lipid Panel: Recent Labs    02/27/17  CHOL 192  HDL 84*  LDLCALC 96  TRIG 62     No results found for: HGBA1C  Procedures since last visit: No results found.  Assessment/Plan   ICD-10-CM   1. Acute cystitis with hematuria N30.01 cephALEXin (KEFLEX) 500 MG capsule    POC Urinalysis Dipstick    Urine Culture    CANCELED: Urine Culture   r/o UTI  2. Contusion of multiple sites of right lower extremity, initial encounter S80.11XA   3. Long term (current) use of anticoagulants Z79.01   4. H/O mitral valve replacement with mechanical valve Z95.2   5. Foot swelling M79.89    right; 2/2 #3  6. Unsteady gait R26.81 Misc. Devices ([place]) MISC    Misc. Devices (ELONGATED TOILET SEAT ELEVATOR) MISC  7. 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 Devices ([place]) MISC    Misc. Devices (ELONGATED TOILET SEAT ELEVATOR) MISC     Check urine dipstick to r/o UTI --> (+) UTI. Send urine cx and s  Apply warm compresses to right leg/thigh at least 3 times daily x 15 minutes each session  Keep right leg elevated when seated  STOP ARNICA  START CEPHALEXIN 500MG  2 TIMES DAILY X 7 DAYS FOR BLADDER INFECTION.   TAKE PROBIOTIC DAILY WHILE ON CEPHALEXIN TO KEEP COLON HEALTHY  Continue other medications as ordered  Complete home health physical therapy  USE DME: STANDARD ROLLING WALKER AND RAISED ELONGATED TOILET SEAT  Follow up as scheduled or sooner if need be.   Malavika Lira S. Perlie Gold  [facility] [address] [place], [place] [address] [phone] Cell (Monday-Friday 8 AM - 5 PM) [phone] After 5 PM and follow prompts

## 2017-08-21 NOTE — Telephone Encounter (Signed)
Patient notified. Will discuss at appointment with Dr. Eulas Post.

## 2017-08-21 NOTE — Patient Instructions (Addendum)
Apply warm compresses to right leg/thigh at least 3 times daily x 15 minutes each session  Keep right leg elevated when seated  STOP ARNICA  START CEPHALEXIN 500MG  2 TIMES DAILY X 7 DAYS FOR BLADDER INFECTION.   TAKE PROBIOTIC DAILY WHILE ON CEPHALEXIN TO KEEP COLON HEALTHY  Continue other medications as ordered  Complete home health physical therapy  USE DME: STANDARD ROLLING WALKER AND RAISED ELONGATED TOILET SEAT  Follow up as scheduled or sooner if need be.

## 2017-08-23 LAB — URINE CULTURE
MICRO NUMBER: 90395536
SPECIMEN QUALITY: ADEQUATE

## 2017-08-24 ENCOUNTER — Ambulatory Visit: Payer: Medicare Other | Admitting: Pharmacotherapy

## 2017-08-24 ENCOUNTER — Encounter: Payer: Self-pay | Admitting: Nurse Practitioner

## 2017-08-24 ENCOUNTER — Ambulatory Visit (INDEPENDENT_AMBULATORY_CARE_PROVIDER_SITE_OTHER): Payer: Medicare Other | Admitting: Nurse Practitioner

## 2017-08-24 VITALS — BP 134/84 | HR 72 | Temp 97.8°F | Ht 63.0 in | Wt 154.0 lb

## 2017-08-24 DIAGNOSIS — Z952 Presence of prosthetic heart valve: Secondary | ICD-10-CM | POA: Diagnosis not present

## 2017-08-24 DIAGNOSIS — Z7901 Long term (current) use of anticoagulants: Secondary | ICD-10-CM

## 2017-08-24 DIAGNOSIS — N3001 Acute cystitis with hematuria: Secondary | ICD-10-CM

## 2017-08-24 LAB — POCT INR: INR: 2.3

## 2017-08-24 NOTE — Patient Instructions (Signed)
Continue the 4 mg of coumadin daily  Follow up in 1 week for INR

## 2017-08-24 NOTE — Progress Notes (Signed)
Careteam: Patient Care Team: Gildardo Cranker, DO as PCP - General (Internal Medicine)  Advanced Directive information    Allergies  Allergen Reactions  . Carvedilol Itching  . Ace Inhibitors Other (See Comments)    Reaction (??)  . Amlodipine Swelling  . Hydrochlorothiazide Other (See Comments)    Hyponatremia     Chief Complaint  Patient presents with  . Follow-up    Pt is biegn seen for an INR check. Current INR is 2.3. Previous INR was 4.1 on 3/37/19.      HPI: Patient is a 77 y.o. female seen in the office today for INR check.  Pt was recently discharged from hospital due to supratherapeutic INR (INR-10). Bruising noted to right lower leg.  She followed up in office on 3/27 and noted to have INR 4.1- INR goal 2.5-3.5 She had also taken coumadin 5 mg and coumadin 4 mg daily was prescribed from rehab.  She was instructed to hold coumadin for 2 days and then resume 4 mg daily Pt was then noted to have UTI and placed on keflex for 7 days and remained on this after culture report. Reports symptoms have improved.  INR today 2.3.   Review of Systems:  Review of Systems  Constitutional: Negative for chills, diaphoresis and fever.  Respiratory: Negative for cough, hemoptysis and shortness of breath.   Cardiovascular: Negative for chest pain and leg swelling.  Gastrointestinal: Negative for blood in stool.  Genitourinary: Negative for dysuria, frequency, hematuria and urgency.  Musculoskeletal: Negative for falls and myalgias.  Skin: Negative for itching and rash.  Neurological: Positive for sensory change. Negative for tingling.  Endo/Heme/Allergies: Bruises/bleeds easily.    Past Medical History:  Diagnosis Date  . Broken hip ([place]) 10/2011   left  . Broken wrist   . Depression   . Diarrhea   . H/O long-term (current) use of anticoagulants   . High cholesterol   . Hypertension   . Memory difficulty 01/18/2016  . 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  Osteoporosis   . SIADH (syndrome of inappropriate ADH production) ([place])   . Vertigo 01/18/2016   Past Surgical History:  Procedure Laterality Date  . HEMATOMA EVACUATION Left 11/25/2012   Procedure: EVACUATION HEMATOMA;  Surgeon: Linna Hoff, MD;  Location: [facility];  Service: Orthopedics;  Laterality: Left;  . HIP PINNING,CANNULATED Left 11/03/2012   Procedure: CANNULATED HIP PINNING;  Surgeon: Tobi Bastos, MD;  Location: [facility];  Service: Orthopedics;  Laterality: Left;  . IR INJECT/THERA/INC NEEDLE/CATH/PLC EPI/LUMB/SAC W/IMG  07/20/2017  . 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  . OPEN REDUCTION INTERNAL FIXATION (ORIF) WRIST WITH ILIAC CREST BONE GRAFT Left 11/24/2012   Procedure: OPEN REDUCTION INTERNAL FIXATION LEFT WRIST POSSIBLE BONE GRAFTING AND PINNING (ORIF) ;  Surgeon: Linna Hoff, MD;  Location: [place] [facility];  Service: Orthopedics;  Laterality: Left;  . SHOULDER OPEN ROTATOR CUFF REPAIR     Social History:   reports that she has never smoked. She has never used smokeless tobacco. She reports that she does not drink alcohol or use drugs.  Family History  Problem Relation Age of Onset  . Stroke Mother   . Hypertension Mother   . Stroke Father   . Colon cancer Paternal Grandmother   . Parkinsonism Brother     Medications: Patient's Medications  New Prescriptions   No medications on file  Previous Medications   ACETAMINOPHEN (TYLENOL) 500 MG  TABLET    Take 500-1,000 mg by mouth every 6 (six) hours as needed.   ALENDRONATE (FOSAMAX) 70 MG TABLET    Take 1 tablet (70 mg total) by mouth every Sunday. Take with a full glass of water on an empty stomach.   ATORVASTATIN (LIPITOR) 10 MG TABLET    Take 1 tablet (10 mg total) by mouth at bedtime.   CEPHALEXIN (KEFLEX) 500 MG CAPSULE    Take 1 capsule (500 mg total) by mouth 2 (two) times daily. For bladder infection   CHOLECALCIFEROL (VITAMIN D3) 1000 UNITS CAPS    Take 1 capsule (1,000 Units total) by mouth daily.    GABAPENTIN (NEURONTIN) 300 MG CAPSULE    Take 1 capsule (300 mg total) by mouth 3 (three) times daily.   LOSARTAN (COZAAR) 100 MG TABLET    Take 1 tablet (100 mg total) by mouth daily.   MECLIZINE (ANTIVERT) 25 MG TABLET    Take 1 tablet (25 mg total) by mouth daily as needed for dizziness.   METOPROLOL SUCCINATE (TOPROL-XL) 50 MG 24 HR TABLET    Take 1 tablet (50 mg total) by mouth daily.   MISC. DEVICES (ELONGATED TOILET SEAT ELEVATOR) MISC    Use as needed   MISC. DEVICES (WALKER) MISC    Standard rolling walker use with walking   OMEGA-3 FATTY ACIDS (OMEGA-3 FISH OIL) 1200 MG CAPS    Take 2 capsules (2,400 mg total) by mouth 2 (two) times daily.   TAMSULOSIN (FLOMAX) 0.4 MG CAPS CAPSULE    Take 1 capsule (0.4 mg total) by mouth daily.   TIZANIDINE (ZANAFLEX) 4 MG TABLET    Take 1 tablet (4 mg total) by mouth 3 (three) times daily as needed for muscle spasms. May cut tablet in half.   TRAZODONE (DESYREL) 50 MG TABLET    Take 25-50 mg by mouth at bedtime as needed for sleep (1/2 to 1 tablet PRN).    WARFARIN (COUMADIN) 4 MG TABLET    Take 1 tablet (4 mg total) by mouth daily.  Modified Medications   No medications on file  Discontinued Medications   No medications on file     Physical Exam:  Vitals:   08/24/17 1137  BP: 134/84  Pulse: 72  Temp: 97.8 F (36.6 C)  TempSrc: Oral  SpO2: 99%  Weight: 154 lb (69.9 kg)  Height: 5\' 3"  (1.6 m)   Body mass index is 27.28 kg/m.  Physical Exam  Constitutional: She is oriented to person, place, and time. She appears well-developed and well-nourished.  HENT:  Head: Normocephalic and atraumatic.  Eyes: Pupils are equal, round, and reactive to light. Conjunctivae and EOM are normal.  Neck: Normal range of motion. Neck supple.  Cardiovascular: Normal rate, regular rhythm and normal heart sounds.  Pulmonary/Chest: Effort normal and breath sounds normal.  Abdominal: Soft. Bowel sounds are normal.  Musculoskeletal: Normal range of motion.  She exhibits edema and tenderness.  Edema noted to right leg with bruising along calf- stable  Neurological: She is alert and oriented to person, place, and time.  Skin: Skin is warm and dry.  Psychiatric: She has a normal mood and affect.    Labs reviewed: Basic Metabolic Panel: Recent Labs    02/27/17  07/17/17 0138  07/20/17 0535 07/21/17 0528 07/22/17 0520  NA 130*   < > 130*   < > 131* 131* 130*  K 4.8   < > 4.4   < > 3.7 4.2 4.6  CL  --    < >  98*   < > 97* 99* 98*  CO2  --    < > 23   < > 22 23 24   GLUCOSE  --    < > 110*   < > 90 119* 112*  BUN 12   < > 15   < > 19 18 16   CREATININE 0.7   < > 0.59   < > 0.63 0.59 0.60  CALCIUM  --    < > 8.2*   < > 9.1 9.3 9.1  MG  --   --  2.1  --   --  1.9  --   TSH 0.62  --   --   --   --   --   --    < > = values in this interval not displayed.   Liver Function Tests: Recent Labs    07/18/17 0014 07/21/17 0528 07/22/17 0520  AST 30 18 25   ALT 43 24 31  ALKPHOS 62 58 55  BILITOT 3.9* 2.4* 1.9*  PROT 6.4* 6.3* 6.1*  ALBUMIN 3.7 3.4* 3.3*   No results for input(s): LIPASE, AMYLASE in the last 8760 hours. Recent Labs    07/16/17 0519  AMMONIA 31   CBC: Recent Labs    07/11/17 1745  07/16/17 0520  07/20/17 0535 07/21/17 0528 07/22/17 0520  WBC 16.4*   < > 21.3*   < > 26.1* 27.1* 21.2*  NEUTROABS 14.4*  --  16.6*  --   --  23.0*  --   HGB 11.0*   < > 8.4*   < > 10.6* 10.4* 10.3*  HCT 32.9*   < > 24.9*   < > 32.0* 31.2* 31.1*  MCV 79.7   < > 85.6   < > 89.6 90.2 90.9  PLT 414*   < > 324   < > 332 332 332   < > = values in this interval not displayed.   Lipid Panel: Recent Labs    02/27/17  CHOL 192  HDL 84*  LDLCALC 96  TRIG 62   TSH: Recent Labs    02/27/17  TSH 0.62   A1C: No results found for: HGBA1C   Assessment/Plan 1. Long term (current) use of anticoagulants - POC INR 2.3 today, goal 2.5-3.5 -pt previously with elevated INR at 4.1 and had taken 5 mg dose therefore was held for 2 days, has  now resumed 4 mg daily. Will continue 4 mg daily at this time and follow up INR in 1 week  2. H/O mitral valve replacement with mechanical valve -stable, INR goal 2.5-3.5  3. Acute cystitis with hematuria Improvement in symptoms with keflex.   Next appt: 08/31/2017 Carlos American. [place], [facility] [phone]

## 2017-08-25 DIAGNOSIS — M4726 Other spondylosis with radiculopathy, lumbar region: Secondary | ICD-10-CM | POA: Diagnosis not present

## 2017-08-25 DIAGNOSIS — N3001 Acute cystitis with hematuria: Secondary | ICD-10-CM | POA: Diagnosis not present

## 2017-08-27 DIAGNOSIS — N3001 Acute cystitis with hematuria: Secondary | ICD-10-CM | POA: Diagnosis not present

## 2017-08-27 DIAGNOSIS — M4726 Other spondylosis with radiculopathy, lumbar region: Secondary | ICD-10-CM | POA: Diagnosis not present

## 2017-08-30 ENCOUNTER — Encounter: Payer: Self-pay | Admitting: Internal Medicine

## 2017-08-31 ENCOUNTER — Ambulatory Visit (INDEPENDENT_AMBULATORY_CARE_PROVIDER_SITE_OTHER): Payer: Medicare Other | Admitting: Nurse Practitioner

## 2017-08-31 ENCOUNTER — Encounter: Payer: Self-pay | Admitting: Nurse Practitioner

## 2017-08-31 VITALS — BP 128/82 | HR 70 | Temp 98.4°F | Ht 63.0 in | Wt 151.0 lb

## 2017-08-31 DIAGNOSIS — R339 Retention of urine, unspecified: Secondary | ICD-10-CM

## 2017-08-31 DIAGNOSIS — Z7901 Long term (current) use of anticoagulants: Secondary | ICD-10-CM | POA: Diagnosis not present

## 2017-08-31 DIAGNOSIS — Z952 Presence of prosthetic heart valve: Secondary | ICD-10-CM | POA: Diagnosis not present

## 2017-08-31 LAB — POCT INR: INR: 3.8

## 2017-08-31 NOTE — Progress Notes (Signed)
Careteam: Patient Care Team: Jodi Cranker, DO as PCP - General (Internal Medicine)  Advanced Directive information    Allergies  Allergen Reactions  . Carvedilol Itching  . Ace Inhibitors Other (See Comments)    Reaction (??)  . Amlodipine Swelling  . Hydrochlorothiazide Other (See Comments)    Hyponatremia     Chief Complaint  Patient presents with  . Follow-up    Pt is being seen for an INR check.      HPI: Patient is a 77 y.o. female seen in the office today for follow up INR.  Pt was recently discharged from hospital due to supratherapeutic INR (INR-10). Bruising noted to right lower leg.  She followed up in office on 3/27 and noted to have INR 4.1(she was instructed to take 4 mg daily from rehab but had taken 5 mg after discharge) - INR goal 2.5-3.5, pts coumadin was placed on hold x 2 days then resumed at 4 mg daily. INR on 08/24/17 was 2.3 and coumadin 4 mg daily was continued.  INR 3.8 today.  Taking medication as prescribed, has home nursing helping her with medication.  There has been no changes to medication or diet No abnormal bleeding, remains with right lower leg bruising   Again has questions about flomax, does not remember having foley or seeing urologist.   Review of Systems:  Review of Systems  Constitutional: Negative for chills, diaphoresis and fever.  Respiratory: Negative for cough, hemoptysis and shortness of breath.   Cardiovascular: Negative for chest pain and leg swelling.  Gastrointestinal: Negative for blood in stool.  Genitourinary: Negative for dysuria, frequency, hematuria and urgency.  Musculoskeletal: Negative for falls and myalgias.  Skin: Negative for itching and rash.  Neurological: Positive for sensory change (numbness and tingling to right leg). Negative for tingling.  Endo/Heme/Allergies: Bruises/bleeds easily.    Past Medical History:  Diagnosis Date  . [place] hip [place] 10/2011   left  . Broken wrist   . Depression     . Diarrhea   . H/O long-term (current) use of anticoagulants   . High cholesterol   . Hypertension   . Memory difficulty 01/18/2016  . 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 Osteoporosis   . SIADH (syndrome of inappropriate ADH production) ([place])   . Vertigo 01/18/2016   Past Surgical History:  Procedure Laterality Date  . HEMATOMA EVACUATION Left 11/25/2012   Procedure: EVACUATION HEMATOMA;  Surgeon: Linna Hoff, MD;  Location: [facility];  Service: Orthopedics;  Laterality: Left;  . HIP PINNING,CANNULATED Left 11/03/2012   Procedure: CANNULATED HIP PINNING;  Surgeon: Tobi Bastos, MD;  Location: [facility];  Service: Orthopedics;  Laterality: Left;  . IR INJECT/THERA/INC NEEDLE/CATH/PLC EPI/LUMB/SAC W/IMG  07/20/2017  . 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  . OPEN REDUCTION INTERNAL FIXATION (ORIF) WRIST WITH ILIAC CREST BONE GRAFT Left 11/24/2012   Procedure: OPEN REDUCTION INTERNAL FIXATION LEFT WRIST POSSIBLE BONE GRAFTING AND PINNING (ORIF) ;  Surgeon: Linna Hoff, MD;  Location: [facility];  Service: Orthopedics;  Laterality: Left;  . SHOULDER OPEN ROTATOR CUFF REPAIR     Social History:   reports that she has never smoked. She has never used smokeless tobacco. She reports that she does not drink alcohol or use drugs.  Family History  Problem Relation Age of Onset  . Stroke Mother   . Hypertension Mother   . Stroke Father   . Colon cancer Paternal  Grandmother   . Parkinsonism Brother     Medications: Patient's Medications  New Prescriptions   No medications on file  Previous Medications   ACETAMINOPHEN (TYLENOL) 500 MG TABLET    Take 500-1,000 mg by mouth every 6 (six) hours as needed.   ALENDRONATE (FOSAMAX) 70 MG TABLET    Take 1 tablet (70 mg total) by mouth every Sunday. Take with a full glass of water on an empty stomach.   ATORVASTATIN (LIPITOR) 10 MG TABLET    Take 1 tablet (10 mg total) by mouth at bedtime.   CHOLECALCIFEROL (VITAMIN D3) 1000 UNITS  CAPS    Take 1 capsule (1,000 Units total) by mouth daily.   GABAPENTIN (NEURONTIN) 300 MG CAPSULE    Take 300 mg by mouth 2 (two) times daily.   LOSARTAN (COZAAR) 100 MG TABLET    Take 1 tablet (100 mg total) by mouth daily.   MECLIZINE (ANTIVERT) 25 MG TABLET    Take 1 tablet (25 mg total) by mouth daily as needed for dizziness.   METOPROLOL SUCCINATE (TOPROL-XL) 50 MG 24 HR TABLET    Take 1 tablet (50 mg total) by mouth daily.   OMEGA-3 FATTY ACIDS (OMEGA-3 FISH OIL) 1200 MG CAPS    Take 2 capsules (2,400 mg total) by mouth 2 (two) times daily.   TAMSULOSIN (FLOMAX) 0.4 MG CAPS CAPSULE    Take 1 capsule (0.4 mg total) by mouth daily.   TIZANIDINE (ZANAFLEX) 4 MG TABLET    Take 1 tablet (4 mg total) by mouth 3 (three) times daily as needed for muscle spasms. May cut tablet in half.   TRAZODONE (DESYREL) 50 MG TABLET    Take 25-50 mg by mouth at bedtime as needed for sleep (1/2 to 1 tablet PRN).    WARFARIN (COUMADIN) 4 MG TABLET    Take 1 tablet (4 mg total) by mouth daily.  Modified Medications   No medications on file  Discontinued Medications   CEPHALEXIN (KEFLEX) 500 MG CAPSULE    Take 1 capsule (500 mg total) by mouth 2 (two) times daily. For bladder infection   GABAPENTIN (NEURONTIN) 300 MG CAPSULE    Take 1 capsule (300 mg total) by mouth 3 (three) times daily.   MISC. DEVICES (ELONGATED TOILET SEAT ELEVATOR) MISC    Use as needed   MISC. DEVICES (WALKER) MISC    Standard rolling walker use with walking     Physical Exam:  Vitals:   08/31/17 1133  BP: 128/82  Pulse: 70  Temp: 98.4 F (36.9 C)  TempSrc: Oral  SpO2: 99%  Weight: 151 lb (68.5 kg)  Height: 5\' 3"  (1.6 m)   Body mass index is 26.75 kg/m.  Physical Exam  Constitutional: She is oriented to person, place, and time. She appears well-developed and well-nourished.  HENT:  Head: Normocephalic and atraumatic.  Eyes: Pupils are equal, round, and reactive to light. Conjunctivae and EOM are normal.  Neck: Normal  range of motion. Neck supple.  Cardiovascular: Normal rate and regular rhythm.  Murmur heard. Pulmonary/Chest: Effort normal and breath sounds normal.  Abdominal: Soft. Bowel sounds are normal.  Musculoskeletal: Normal range of motion. She exhibits edema and tenderness.  Edema noted to right leg with bruising along calf-improving  Neurological: She is alert and oriented to person, place, and time.  Skin: Skin is warm and dry.  Psychiatric: She has a normal mood and affect.   Labs reviewed: Basic Metabolic Panel: Recent Labs    02/27/17  07/17/17 0138  07/20/17 0535 07/21/17 0528 07/22/17 0520  NA 130*   < > 130*   < > 131* 131* 130*  K 4.8   < > 4.4   < > 3.7 4.2 4.6  CL  --    < > 98*   < > 97* 99* 98*  CO2  --    < > 23   < > 22 23 24   GLUCOSE  --    < > 110*   < > 90 119* 112*  BUN 12   < > 15   < > 19 18 16   CREATININE 0.7   < > 0.59   < > 0.63 0.59 0.60  CALCIUM  --    < > 8.2*   < > 9.1 9.3 9.1  MG  --   --  2.1  --   --  1.9  --   TSH 0.62  --   --   --   --   --   --    < > = values in this interval not displayed.   Liver Function Tests: Recent Labs    07/18/17 0014 07/21/17 0528 07/22/17 0520  AST 30 18 25   ALT 43 24 31  ALKPHOS 62 58 55  BILITOT 3.9* 2.4* 1.9*  PROT 6.4* 6.3* 6.1*  ALBUMIN 3.7 3.4* 3.3*   No results for input(s): LIPASE, AMYLASE in the last 8760 hours. Recent Labs    07/16/17 0519  AMMONIA 31   CBC: Recent Labs    07/11/17 1745  07/16/17 0520  07/20/17 0535 07/21/17 0528 07/22/17 0520  WBC 16.4*   < > 21.3*   < > 26.1* 27.1* 21.2*  NEUTROABS 14.4*  --  16.6*  --   --  23.0*  --   HGB 11.0*   < > 8.4*   < > 10.6* 10.4* 10.3*  HCT 32.9*   < > 24.9*   < > 32.0* 31.2* 31.1*  MCV 79.7   < > 85.6   < > 89.6 90.2 90.9  PLT 414*   < > 324   < > 332 332 332   < > = values in this interval not displayed.   Lipid Panel: Recent Labs    02/27/17  CHOL 192  HDL 84*  LDLCALC 96  TRIG 62   TSH: Recent Labs    02/27/17  TSH 0.62     A1C: No results found for: HGBA1C   Assessment/Plan 1. Long term (current) use of anticoagulants - POC INR 3.8, to reduce coumadin to 2 mg on mondays (1/2 tablet) then resume 4 mg daily. Follow up INR in 1 week.   2. H/O mitral valve replacement with mechanical valve -stable, goal INR 2.5-3.5 on coumadin   3. Urinary retention Improved on flomax, urology records requested.   Next appt: 1 week for INR with [facility]. [place], [facility] [phone]

## 2017-08-31 NOTE — Patient Instructions (Addendum)
To reduced coumadin on MONDAYS to to 2 mg (1/2 tablet) continue coumadin 4 mg daily all other days  Follow up with [facility] in 1 week for recheck INR  [facility] Address: [address], [place], [place] [address] Phone: [phone]  To have helpers assist you in making a weekly pill box to make sure medications are taken appropriately

## 2017-09-01 ENCOUNTER — Encounter: Payer: Self-pay | Admitting: Neurology

## 2017-09-01 ENCOUNTER — Encounter: Payer: Self-pay | Admitting: Internal Medicine

## 2017-09-01 DIAGNOSIS — R339 Retention of urine, unspecified: Secondary | ICD-10-CM

## 2017-09-01 DIAGNOSIS — M4726 Other spondylosis with radiculopathy, lumbar region: Secondary | ICD-10-CM | POA: Diagnosis not present

## 2017-09-01 DIAGNOSIS — N3001 Acute cystitis with hematuria: Secondary | ICD-10-CM | POA: Diagnosis not present

## 2017-09-01 MED ORDER — ALENDRONATE SODIUM 70 MG PO TABS: 70.0000 mg | ORAL_TABLET | ORAL | 1 refills | Status: DC

## 2017-09-01 MED ORDER — TAMSULOSIN HCL 0.4 MG PO CAPS: 0.4000 mg | ORAL_CAPSULE | Freq: Every day | ORAL | 1 refills | Status: DC

## 2017-09-01 MED ORDER — TRAZODONE HCL 50 MG PO TABS: 25.0000 mg | ORAL_TABLET | Freq: Every evening | ORAL | 0 refills | Status: DC | PRN

## 2017-09-01 MED ORDER — GABAPENTIN 300 MG PO CAPS: 300.0000 mg | ORAL_CAPSULE | Freq: Two times a day (BID) | ORAL | 1 refills | Status: DC

## 2017-09-01 MED ORDER — ATORVASTATIN CALCIUM 10 MG PO TABS: 10.0000 mg | ORAL_TABLET | Freq: Every day | ORAL | 1 refills | Status: DC

## 2017-09-01 MED ORDER — METOPROLOL SUCCINATE ER 50 MG PO TB24: 50.0000 mg | ORAL_TABLET | Freq: Every day | ORAL | 1 refills | Status: DC

## 2017-09-01 MED ORDER — LOSARTAN POTASSIUM 100 MG PO TABS: 100.0000 mg | ORAL_TABLET | Freq: Every day | ORAL | 1 refills | Status: DC

## 2017-09-02 DIAGNOSIS — N3001 Acute cystitis with hematuria: Secondary | ICD-10-CM | POA: Diagnosis not present

## 2017-09-02 DIAGNOSIS — M4726 Other spondylosis with radiculopathy, lumbar region: Secondary | ICD-10-CM | POA: Diagnosis not present

## 2017-09-03 ENCOUNTER — Encounter: Payer: Self-pay | Admitting: Internal Medicine

## 2017-09-04 ENCOUNTER — Telehealth: Payer: Self-pay

## 2017-09-04 MED ORDER — TRAMADOL HCL 50 MG PO TABS: 50.0000 mg | ORAL_TABLET | Freq: Three times a day (TID) | ORAL | 0 refills | Status: DC | PRN

## 2017-09-04 NOTE — Telephone Encounter (Signed)
Tramadol Rx sent to pharmacy

## 2017-09-04 NOTE — Telephone Encounter (Signed)
Patient states that she is having right foot pain often due to large bruise on the back of her lower leg. Patient describes pain as a tingling, cramping pain that occurs often. Patient would like some type of pain medication. Please advise.    FYI: Medication list was review with patient by phone and patient states that she is now clear on how to take her medications.

## 2017-09-04 NOTE — Telephone Encounter (Signed)
Refer to phone note dated for 09/04/17.

## 2017-09-04 NOTE — Telephone Encounter (Signed)
Patient states that the gabapentin and tylenol are not helping the pain. Patient is still requesting something different.

## 2017-09-04 NOTE — Telephone Encounter (Signed)
-----   Message from [place], Generic sent at 09/03/2017 9:08 PM EDT -----    Thank you and I will need a copy. One question about meds. Is anything prescribed for pain? I do have pain in my right foot and would like some pain reducing med prescribed for it.  Thanks, Romie Minus  ----- Message -----  From: CMA Forrestine Him  Sent: 09/03/2017 2:30 PM EDT  To: Tod Persia  Subject: RE: Non-Urgent Medical Question  Mrs. Johndrow,     You were provided with the most up to date medication list at the end of your office visit. It is included in your after visit summary that was given to you just before you left. If you need a copy of it, we can mail you a copy.    I left a message for patient to call the office to discuss need for pain medications. Patient has an appointment with [facility] on 09/07/17 and she will get an up to date medication list at that time.

## 2017-09-04 NOTE — Telephone Encounter (Signed)
She gets gabapentin and tylenol for pain

## 2017-09-04 NOTE — Telephone Encounter (Signed)
Patient aware rx sent  

## 2017-09-07 ENCOUNTER — Ambulatory Visit (INDEPENDENT_AMBULATORY_CARE_PROVIDER_SITE_OTHER): Payer: Medicare Other | Admitting: Pharmacotherapy

## 2017-09-07 ENCOUNTER — Encounter: Payer: Self-pay | Admitting: Pharmacotherapy

## 2017-09-07 ENCOUNTER — Encounter: Payer: Self-pay | Admitting: Internal Medicine

## 2017-09-07 DIAGNOSIS — Z7901 Long term (current) use of anticoagulants: Secondary | ICD-10-CM | POA: Diagnosis not present

## 2017-09-07 DIAGNOSIS — Z952 Presence of prosthetic heart valve: Secondary | ICD-10-CM

## 2017-09-07 DIAGNOSIS — I480 Paroxysmal atrial fibrillation: Secondary | ICD-10-CM | POA: Diagnosis not present

## 2017-09-07 LAB — POCT INR: INR: 4.1

## 2017-09-07 MED ORDER — WARFARIN SODIUM 4 MG PO TABS: ORAL_TABLET | ORAL | 0 refills | Status: DC

## 2017-09-07 NOTE — Progress Notes (Signed)
   Subjective:    Patient ID: Jodi Frank, female    DOB: 06/13/1940, 77 y.o.   MRN: [id number]  HPI Last INR on 08/31/17 was high at 3.8 Coumadin was reduced to 5m daily Denies unusual bleeding or bruising Denies CP, falls Consistent with vitamin K intake.   Review of Systems  HENT: Negative for nosebleeds.   Respiratory: Negative for shortness of breath.   Cardiovascular: Negative for chest pain.  Gastrointestinal: Negative for anal bleeding and blood in stool.  Genitourinary: Negative for hematuria.  Hematological: Does not bruise/bleed easily.       Objective:   Physical Exam  Constitutional: She is oriented to person, place, and time. She appears well-developed and well-nourished.  HENT:  Right Ear: External ear normal.  Left Ear: External ear normal.  Cardiovascular: Normal rate and regular rhythm.  Mechanical click from valve  Pulmonary/Chest: Effort normal and breath sounds normal.  Neurological: She is alert and oriented to person, place, and time.  Skin: Skin is warm and dry.  Psychiatric: She has a normal mood and affect. Her behavior is normal. Thought content normal.  Vitals reviewed.  BP: 122/80  HR: 85  Wt: 155lb INR 4.1        Assessment & Plan:  1.  INR above goal 2.5-3.5 2.  Hold x 1, then start Coumadin 444mdaily except 69m169mn Tuesdays 3.  RTC 1 week

## 2017-09-07 NOTE — Patient Instructions (Signed)
No Coumadin today Then start Coumadin 4mg  daily except 1/2 tablet every Tuesday

## 2017-09-09 DIAGNOSIS — N3001 Acute cystitis with hematuria: Secondary | ICD-10-CM | POA: Diagnosis not present

## 2017-09-09 DIAGNOSIS — M4726 Other spondylosis with radiculopathy, lumbar region: Secondary | ICD-10-CM | POA: Diagnosis not present

## 2017-09-10 DIAGNOSIS — N3001 Acute cystitis with hematuria: Secondary | ICD-10-CM | POA: Diagnosis not present

## 2017-09-10 DIAGNOSIS — M4726 Other spondylosis with radiculopathy, lumbar region: Secondary | ICD-10-CM | POA: Diagnosis not present

## 2017-09-13 ENCOUNTER — Encounter: Payer: Self-pay | Admitting: Internal Medicine

## 2017-09-14 ENCOUNTER — Encounter: Payer: Self-pay | Admitting: Pharmacotherapy

## 2017-09-14 ENCOUNTER — Ambulatory Visit (INDEPENDENT_AMBULATORY_CARE_PROVIDER_SITE_OTHER): Payer: Medicare Other | Admitting: Pharmacotherapy

## 2017-09-14 DIAGNOSIS — I480 Paroxysmal atrial fibrillation: Secondary | ICD-10-CM

## 2017-09-14 DIAGNOSIS — Z7901 Long term (current) use of anticoagulants: Secondary | ICD-10-CM

## 2017-09-14 DIAGNOSIS — Z952 Presence of prosthetic heart valve: Secondary | ICD-10-CM | POA: Diagnosis not present

## 2017-09-14 LAB — POCT INR: INR: 2.9

## 2017-09-14 NOTE — Progress Notes (Signed)
   Subjective:    Patient ID: Jodi Frank, female    DOB: [birth year], 77 y.o.   MRN: [id number]  HPI Last INR on 09/07/17 was high at 4.1 Coumadin was held x 1, then restarted at 77m QD except 262mon Tuesdays Denies missed doses Denies unusual bleeding or bruising  Denies CP, falls Consistent with vitamin K intake   Review of Systems  HENT: Negative for nosebleeds.   Cardiovascular: Negative for chest pain.  Gastrointestinal: Negative for anal bleeding and blood in stool.  Genitourinary: Negative for hematuria.  Hematological: Does not bruise/bleed easily.       Objective:   Physical Exam  Constitutional: She is oriented to person, place, and time. She appears well-developed and well-nourished.  HENT:  Right Ear: External ear normal.  Left Ear: External ear normal.  Cardiovascular: Normal rate and regular rhythm.  Mechanical click from MVR  Pulmonary/Chest: Effort normal and breath sounds normal.  Neurological: She is alert and oriented to person, place, and time.  Skin: Skin is warm and dry.  Psychiatric: She has a normal mood and affect. Her behavior is normal. Thought content normal.  Vitals reviewed.  BP: 138/77, HR: 73: wt: 153lb INR 2.9       Assessment & Plan:  1.  INR at goal 2.5-3.5 2.  Continue Coumadin 76m8mD except 2mg45m Tuesdays 3.  RTC 2 weeks

## 2017-09-14 NOTE — Patient Instructions (Signed)
INR 2.9 Continue Coumadin 4mg  daily except 2mg  (1/2 tablet) on Tuesdays

## 2017-09-16 ENCOUNTER — Encounter: Payer: Self-pay | Admitting: Internal Medicine

## 2017-09-17 ENCOUNTER — Other Ambulatory Visit: Payer: Self-pay | Admitting: Internal Medicine

## 2017-09-17 DIAGNOSIS — M5431 Sciatica, right side: Secondary | ICD-10-CM

## 2017-09-18 ENCOUNTER — Encounter: Payer: Self-pay | Admitting: Internal Medicine

## 2017-09-18 ENCOUNTER — Encounter: Payer: Medicare Other | Admitting: Internal Medicine

## 2017-09-18 ENCOUNTER — Telehealth: Payer: Self-pay

## 2017-09-18 NOTE — Telephone Encounter (Signed)
I cannot diagnose her without an office visit. Recommend she go to urgent care for further eval

## 2017-09-18 NOTE — Telephone Encounter (Signed)
Left detailed message with Dr.Carter's response. Patient advised to call back if she has questions or concerns

## 2017-09-18 NOTE — Telephone Encounter (Signed)
Patient was to be seen today and had to leave due to doctor running behind schedule.  Patient states her right foot is still dragging, twitching, and sensitivie. Patient has to wear a sock at night to help with sensitivity. First onset of symptoms was a month ago. Patient not sure what to do about this.  Pharmacy on file confirmed  Please advise

## 2017-09-19 ENCOUNTER — Other Ambulatory Visit: Payer: Self-pay | Admitting: Internal Medicine

## 2017-09-21 ENCOUNTER — Other Ambulatory Visit: Payer: Self-pay | Admitting: *Deleted

## 2017-09-21 MED ORDER — TRAMADOL HCL 50 MG PO TABS: 50.0000 mg | ORAL_TABLET | Freq: Three times a day (TID) | ORAL | 0 refills | Status: DC | PRN

## 2017-09-21 NOTE — Telephone Encounter (Signed)
Patient called requesting refill on her Tramadol due to foot pain.  [place] Verified LR: 09/07/17 #30 Pended Rx and sent to Dr. Eulas Post for approval.

## 2017-09-23 ENCOUNTER — Encounter: Payer: Self-pay | Admitting: Internal Medicine

## 2017-09-28 ENCOUNTER — Ambulatory Visit: Payer: Medicare Other | Admitting: Pharmacotherapy

## 2017-09-29 ENCOUNTER — Encounter: Payer: Self-pay | Admitting: Internal Medicine

## 2017-09-29 DIAGNOSIS — M65871 Other synovitis and tenosynovitis, right ankle and foot: Secondary | ICD-10-CM | POA: Diagnosis not present

## 2017-09-29 DIAGNOSIS — G5751 Tarsal tunnel syndrome, right lower limb: Secondary | ICD-10-CM | POA: Diagnosis not present

## 2017-10-02 ENCOUNTER — Ambulatory Visit (INDEPENDENT_AMBULATORY_CARE_PROVIDER_SITE_OTHER): Payer: Medicare Other

## 2017-10-02 ENCOUNTER — Other Ambulatory Visit: Payer: Medicare Other

## 2017-10-02 VITALS — BP 152/78 | HR 69 | Temp 98.4°F | Ht 63.0 in | Wt 151.0 lb

## 2017-10-02 DIAGNOSIS — I10 Essential (primary) hypertension: Secondary | ICD-10-CM

## 2017-10-02 DIAGNOSIS — E782 Mixed hyperlipidemia: Secondary | ICD-10-CM

## 2017-10-02 DIAGNOSIS — Z7901 Long term (current) use of anticoagulants: Secondary | ICD-10-CM

## 2017-10-02 DIAGNOSIS — Z952 Presence of prosthetic heart valve: Secondary | ICD-10-CM | POA: Diagnosis not present

## 2017-10-02 DIAGNOSIS — D649 Anemia, unspecified: Secondary | ICD-10-CM | POA: Diagnosis not present

## 2017-10-02 DIAGNOSIS — Z0000 Encounter for general adult medical examination without abnormal findings: Secondary | ICD-10-CM | POA: Diagnosis not present

## 2017-10-02 LAB — TSH: TSH: 0.72 m[IU]/L (ref 0.40–4.50)

## 2017-10-02 LAB — CBC WITH DIFFERENTIAL/PLATELET
Basophils Absolute: 26 cells/uL (ref 0–200)
Basophils Relative: 0.3 %
EOS PCT: 1 %
Eosinophils Absolute: 87 cells/uL (ref 15–500)
HCT: 41.7 % (ref 35.0–45.0)
HEMOGLOBIN: 13.5 g/dL (ref 11.7–15.5)
LYMPHS ABS: 1740 {cells}/uL (ref 850–3900)
MCH: 28.7 pg (ref 27.0–33.0)
MCHC: 32.4 g/dL (ref 32.0–36.0)
MCV: 88.5 fL (ref 80.0–100.0)
MPV: 10.4 fL (ref 7.5–12.5)
Monocytes Relative: 9.6 %
NEUTROS ABS: 6012 {cells}/uL (ref 1500–7800)
NEUTROS PCT: 69.1 %
PLATELETS: 353 10*3/uL (ref 140–400)
RBC: 4.71 10*6/uL (ref 3.80–5.10)
RDW: 13.4 % (ref 11.0–15.0)
Total Lymphocyte: 20 %
WBC: 8.7 10*3/uL (ref 3.8–10.8)
WBCMIX: 835 {cells}/uL (ref 200–950)

## 2017-10-02 LAB — COMPLETE METABOLIC PANEL WITH GFR
AG Ratio: 2 (calc) (ref 1.0–2.5)
ALBUMIN MSPROF: 4.5 g/dL (ref 3.6–5.1)
ALKALINE PHOSPHATASE (APISO): 53 U/L (ref 33–130)
ALT: 12 U/L (ref 6–29)
AST: 15 U/L (ref 10–35)
BILIRUBIN TOTAL: 0.9 mg/dL (ref 0.2–1.2)
BUN: 12 mg/dL (ref 7–25)
CHLORIDE: 102 mmol/L (ref 98–110)
CO2: 27 mmol/L (ref 20–32)
CREATININE: 0.7 mg/dL (ref 0.60–0.93)
Calcium: 9.7 mg/dL (ref 8.6–10.4)
GFR, EST AFRICAN AMERICAN: 98 mL/min/{1.73_m2} (ref >=60)
GFR, Est Non African American: 85 mL/min/{1.73_m2} (ref >=60)
GLOBULIN: 2.3 g/dL (ref 1.9–3.7)
GLUCOSE: 105 mg/dL — AB (ref 65–99)
Potassium: 4.2 mmol/L (ref 3.5–5.3)
SODIUM: 136 mmol/L (ref 135–146)
TOTAL PROTEIN: 6.8 g/dL (ref 6.1–8.1)

## 2017-10-02 LAB — LIPID PANEL
CHOLESTEROL: 185 mg/dL (ref <200)
HDL: 71 mg/dL (ref >50)
LDL CHOLESTEROL (CALC): 101 mg/dL — AB
Non-HDL Cholesterol (Calc): 114 mg/dL (calc) (ref <130)
TRIGLYCERIDES: 50 mg/dL (ref <150)
Total CHOL/HDL Ratio: 2.6 (calc) (ref <5.0)

## 2017-10-02 NOTE — Progress Notes (Signed)
Subjective:   Jodi Frank is a 77 y.o. female who presents for an Initial Medicare Annual Wellness Visit.       Objective:    Today's Vitals   10/02/17 1024  BP: (!) 152/78  Pulse: 69  Temp: 98.4 F (36.9 C)  TempSrc: Oral  SpO2: 97%  Weight: 151 lb (68.5 kg)  Height: 5\' 3"  (1.6 m)   Body mass index is 26.75 kg/m.  Advanced Directives 10/02/2017 07/12/2017 05/04/2017 05/04/2017 11/25/2012 11/03/2012 11/02/2012  Does Patient Have a Medical Advance Directive? Yes Yes Yes No Patient has advance directive, copy not in chart Patient has advance directive, copy not in chart Patient has advance directive, copy not in chart  Type of Advance Directive [place];Living will Living will [place];Living will - [place];Living will [place];Living will [place];Living will  Does patient want to make changes to medical advance directive? No - Patient declined No - Patient declined No - Patient declined - - - -  Copy of [place] in Chart? Yes - No - copy requested - Copy requested from family Copy requested from family -  Would patient like information on creating a medical advance directive? - - No - Patient declined No - Patient declined - - -  Pre-existing out of facility DNR order (yellow form or pink MOST form) - - - - - Yes, notify physician for inpatient order -    Current Medications (verified) Outpatient Encounter Medications as of 10/02/2017  Medication Sig  . acetaminophen (TYLENOL) 500 MG tablet Take 500-1,000 mg by mouth every 6 (six) hours as needed.  Marland Kitchen alendronate (FOSAMAX) 70 MG tablet Take 1 tablet (70 mg total) by mouth every Sunday. Take with a full glass of water on an empty stomach.  Marland Kitchen atorvastatin (LIPITOR) 10 MG tablet Take 1 tablet (10 mg total) by mouth at bedtime.  . Cholecalciferol (VITAMIN D3) 1000 units CAPS Take 1 capsule (1,000 Units total) by mouth  daily.  Marland Kitchen gabapentin (NEURONTIN) 300 MG capsule Take 1 capsule (300 mg total) by mouth 2 (two) times daily.  Marland Kitchen losartan (COZAAR) 100 MG tablet Take 1 tablet (100 mg total) by mouth daily.  . meclizine (ANTIVERT) 25 MG tablet Take 1 tablet (25 mg total) by mouth daily as needed for dizziness.  . metoprolol succinate (TOPROL-XL) 50 MG 24 hr tablet Take 1 tablet (50 mg total) by mouth daily.  . Omega-3 Fatty Acids (OMEGA-3 FISH OIL) 1200 MG CAPS Take 2 capsules (2,400 mg total) by mouth 2 (two) times daily.  . tamsulosin (FLOMAX) 0.4 MG CAPS capsule Take 1 capsule (0.4 mg total) by mouth daily.  Marland Kitchen tiZANidine (ZANAFLEX) 4 MG tablet TAKE ONE TABLET BY MOUTH THREE TIMES A DAY AS NEEDED FOR MUSCLES SPASMS  --MAY CUT TABLET IN HALF  . traMADol (ULTRAM) 50 MG tablet Take 1 tablet (50 mg total) by mouth every 8 (eight) hours as needed.  . traZODone (DESYREL) 50 MG tablet Take 0.5-1 tablets (25-50 mg total) by mouth at bedtime as needed for sleep (1/2 to 1 tablet PRN).  Marland Kitchen warfarin (COUMADIN) 4 MG tablet Take 1 tablet daily except 1/2 tablet on Tuesdays   No facility-administered encounter medications on file as of 10/02/2017.     Allergies (verified) Carvedilol; Ace inhibitors; Amlodipine; and Hydrochlorothiazide   History: Past Medical History:  Diagnosis Date  . Broken hip ([place]) 10/2011   left  . Broken  wrist   . Depression   . Diarrhea   . H/O long-term (current) use of anticoagulants   . High cholesterol   . Hypertension   . Memory difficulty 01/18/2016  . 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 Osteoporosis   . SIADH (syndrome of inappropriate ADH production) ([place])   . Urinary retention    chronic  . Vertigo 01/18/2016   Past Surgical History:  Procedure Laterality Date  . HEMATOMA EVACUATION Left 11/25/2012   Procedure: EVACUATION HEMATOMA;  Surgeon: Linna Hoff, MD;  Location: [facility];  Service: Orthopedics;  Laterality: Left;  . HIP PINNING,CANNULATED Left 11/03/2012    Procedure: CANNULATED HIP PINNING;  Surgeon: Tobi Bastos, MD;  Location: [facility];  Service: Orthopedics;  Laterality: Left;  . IR INJECT/THERA/INC NEEDLE/CATH/PLC EPI/LUMB/SAC W/IMG  07/20/2017  . 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  . OPEN REDUCTION INTERNAL FIXATION (ORIF) WRIST WITH ILIAC CREST BONE GRAFT Left 11/24/2012   Procedure: OPEN REDUCTION INTERNAL FIXATION LEFT WRIST POSSIBLE BONE GRAFTING AND PINNING (ORIF) ;  Surgeon: Linna Hoff, MD;  Location: [facility];  Service: Orthopedics;  Laterality: Left;  . SHOULDER OPEN ROTATOR CUFF REPAIR     Family History  Problem Relation Age of Onset  . Stroke Mother   . Hypertension Mother   . Stroke Father   . Colon cancer Paternal Grandmother   . Parkinsonism Brother    Social History   Socioeconomic History  . Marital status: Divorced    Spouse name: Not on file  . Number of children: 1  . Years of education: BS  . Highest education level: Not on file  Occupational History  . Occupation: Retired  Scientific laboratory technician  . Financial resource strain: Not hard at all  . Food insecurity:    Worry: Never true    Inability: Never true  . Transportation needs:    Medical: No    Non-medical: No  Tobacco Use  . Smoking status: Never Smoker  . Smokeless tobacco: Never Used  Substance and Sexual Activity  . Alcohol use: No    Frequency: Never  . Drug use: No  . Sexual activity: Not Currently  Lifestyle  . Physical activity:    Days per week: 0 days    Minutes per session: 0 min  . Stress: Not at all  Relationships  . Social connections:    Talks on phone: Twice a week    Gets together: Twice a week    Attends religious service: Never    Active member of club or organization: No    Attends meetings of clubs or organizations: Never    Relationship status: Divorced  Other Topics Concern  . Not on file  Social History Narrative   Lives at home, alone   Right-handed   Caffeine: 2 cups per day    Tobacco  Counseling Counseling given: Not Answered   Clinical Intake:  Pre-visit preparation completed: No  Pain : No/denies pain     Nutritional Risks: None Diabetes: No  How often do you need to have someone help you when you read instructions, pamphlets, or other written materials from your doctor or pharmacy?: 1 - Never What is the last grade level you completed in school?: CO[employer]  Interpreter Needed?: No  Information entered by :: [name], RN   Activities of Daily Living In your present state of health, do you have any difficulty performing the following activities: 10/02/2017 07/12/2017  Hearing? N N  Vision? N N  Difficulty concentrating or making decisions? N N  Walking or climbing stairs? Y Y  Dressing or bathing? N Y  Doing errands, shopping? N Y  Conservation officer, nature and eating ? N -  Using the Toilet? N -  In the past six months, have you accidently leaked urine? N -  Do you have problems with loss of bowel control? N -  Managing your Medications? N -  Managing your Finances? N -  Housekeeping or managing your Housekeeping? N -  Some recent data might be hidden     Immunizations and Health Maintenance Immunization History  Administered Date(s) Administered  . Influenza, High Dose Seasonal PF 02/17/2014, 02/14/2015  . Influenza, Seasonal, Injecte, Preservative Fre 02/14/2016  . Influenza-Unspecified 02/23/2013, 02/26/2017  . Pneumococcal Conjugate-13 08/27/2013  . Pneumococcal Polysaccharide-23 05/03/2007, 09/26/2009, 02/17/2014  . Td 06/26/2005, 07/09/2005  . Tetanus 06/26/2005, 07/09/2005  . Zoster 08/19/2013   Health Maintenance Due  Topic Date Due  . TETANUS/TDAP  07/10/2015  . COLONOSCOPY  09/20/2015    Patient Care Team: Gildardo Cranker, DO as PCP - General (Internal Medicine)  Indicate any recent Medical Services you may have received from other than [facility] providers in the past year (date may be approximate).     Assessment:   This is a routine  wellness examination for [place].  Hearing/Vision screen Hearing Screening Comments: No problems with hearing Vision Screening Comments: Sees eye doctor every few years  Dietary issues and exercise activities discussed: Current Exercise Habits: The patient does not participate in regular exercise at present, Exercise limited by: None identified  Goals    None     Depression Screen PHQ 2/9 Scores 10/02/2017 05/12/2017  PHQ - 2 Score 0 0    Fall Risk Fall Risk  10/02/2017 08/31/2017 08/24/2017 08/21/2017 08/19/2017  Falls in the past year? No Yes Yes No Yes  Number falls in past yr: - 1 1 - 1  Injury with Fall? - Yes Yes - Yes    Is the patient's home free of loose throw rugs in walkways, pet beds, electrical cords, etc?   yes      Grab bars in the bathroom? no      Handrails on the stairs?   yes      Adequate lighting?   yes  Cognitive Function: MMSE - Mini Mental State Exam 10/02/2017 01/18/2016  Orientation to time 5 5  Orientation to Place 5 5  Registration 3 3  Attention/ Calculation 5 5  Recall 3 2  Language- name 2 objects 2 2  Language- repeat 1 1  Language- follow 3 step command 3 3  Language- read & follow direction 1 1  Write a sentence 1 1  Copy design 1 1  Total score 30 29        Screening Tests Health Maintenance  Topic Date Due  . TETANUS/TDAP  07/10/2015  . COLONOSCOPY  09/20/2015  . INFLUENZA VACCINE  12/24/2017  . DEXA SCAN  Completed  . PNA vac Low Risk Adult  Completed    Qualifies for Shingles Vaccine? Yes, educated and declined  Cancer Screenings: Lung: Low Dose CT Chest recommended if Age 74-80 years, 30 pack-year currently smoking OR have quit w/in 15years. Patient does not qualify. Breast: Up to date on Mammogram? No, declined Up to date of Bone Density/Dexa? No, declined Colorectal: due, cologuard information filled out and signed  Additional Screenings:  Hepatitis C Screening: declined TDAP due: declined  Plan:    I have personally  reviewed and addressed the Medicare Annual Wellness questionnaire and have noted the following in the patient's chart:  A. Medical and social history B. Use of alcohol, tobacco or illicit drugs  C. Current medications and supplements D. Functional ability and status E.  Nutritional status F.  Physical activity G. Advance directives H. List of other physicians I.  Hospitalizations, surgeries, and ER visits in previous 12 months J.  [place] to include hearing, vision, cognitive, depression L. Referrals and appointments - none  In addition, I have reviewed and discussed with patient certain preventive protocols, quality metrics, and best practice recommendations. A written personalized care plan for preventive services as well as general preventive health recommendations were provided to patient.  See attached scanned questionnaire for additional information.   Signed,   [name], RN Nurse Health Advisor  Patient Concerns: Would like medication review

## 2017-10-02 NOTE — Patient Instructions (Signed)
[name] , Thank you for taking time to come for your Medicare Wellness Visit. I appreciate your ongoing commitment to your health goals. Please review the following plan we discussed and let me know if I can assist you in the future.   Screening recommendations/referrals: Colonoscopy due, cologuard information signed today Mammogram due, declined Bone Density due, declined Recommended yearly ophthalmology/optometry visit for glaucoma screening and checkup Recommended yearly dental visit for hygiene and checkup  Vaccinations: Influenza vaccine up to date, due 2019 fall season Pneumococcal vaccine up to date, completed Tdap vaccine due, declined Shingles vaccine due, declined    Advanced directives: In chart  Conditions/risks identified: none  Next appointment: [name], RN 10/06/2018 @ 10:45am   Preventive Care 65 Years and Older, Female Preventive care refers to lifestyle choices and visits with your health care provider that can promote health and wellness. What does preventive care include?  A yearly physical exam. This is also called an annual well check.  Dental exams once or twice a year.  Routine eye exams. Ask your health care provider how often you should have your eyes checked.  Personal lifestyle choices, including:  Daily care of your teeth and gums.  Regular physical activity.  Eating a healthy diet.  Avoiding tobacco and drug use.  Limiting alcohol use.  Practicing safe sex.  Taking low-dose aspirin every day.  Taking vitamin and mineral supplements as recommended by your health care provider. What happens during an annual well check? The services and screenings done by your health care provider during your annual well check will depend on your age, overall health, lifestyle risk factors, and family history of disease. Counseling  Your health care provider may ask you questions about your:  Alcohol use.  Tobacco use.  Drug  use.  Emotional well-being.  Home and relationship well-being.  Sexual activity.  Eating habits.  History of falls.  Memory and ability to understand (cognition).  Work and work Statistician.  Reproductive health. Screening  You may have the following tests or measurements:  Height, weight, and BMI.  Blood pressure.  Lipid and cholesterol levels. These may be checked every 5 years, or more frequently if you are over 57 years old.  Skin check.  Lung cancer screening. You may have this screening every year starting at age 15 if you have a 30-pack-year history of smoking and currently smoke or have quit within the past 15 years.  Fecal occult blood test (FOBT) of the stool. You may have this test every year starting at age 76.  Flexible sigmoidoscopy or colonoscopy. You may have a sigmoidoscopy every 5 years or a colonoscopy every 10 years starting at age 76.  Hepatitis C blood test.  Hepatitis B blood test.  Sexually transmitted disease (STD) testing.  Diabetes screening. This is done by checking your blood sugar (glucose) after you have not eaten for a while (fasting). You may have this done every 1-3 years.  Bone density scan. This is done to screen for osteoporosis. You may have this done starting at age 48.  Mammogram. This may be done every 1-2 years. Talk to your health care provider about how often you should have regular mammograms. Talk with your health care provider about your test results, treatment options, and if necessary, the need for more tests. Vaccines  Your health care provider may recommend certain vaccines, such as:  Influenza vaccine. This is recommended every year.  Tetanus, diphtheria, and acellular pertussis (Tdap, Td) vaccine. You may need  a Td booster every 10 years.  Zoster vaccine. You may need this after age 5.  Pneumococcal 13-valent conjugate (PCV13) vaccine. One dose is recommended after age 5.  Pneumococcal polysaccharide  (PPSV23) vaccine. One dose is recommended after age 81. Talk to your health care provider about which screenings and vaccines you need and how often you need them. This information is not intended to replace advice given to you by your health care provider. Make sure you discuss any questions you have with your health care provider. Document Released: 06/08/2015 Document Revised: 01/30/2016 Document Reviewed: 03/13/2015 Elsevier Interactive Patient Education  2017 [employer] Prevention in the Home Falls can cause injuries. They can happen to people of all ages. There are many things you can do to make your home safe and to help prevent falls. What can I do on the outside of my home?  Regularly fix the edges of walkways and driveways and fix any cracks.  Remove anything that might make you trip as you walk through a door, such as a raised step or threshold.  Trim any bushes or trees on the path to your home.  Use bright outdoor lighting.  Clear any walking paths of anything that might make someone trip, such as rocks or tools.  Regularly check to see if handrails are loose or broken. Make sure that both sides of any steps have handrails.  Any raised decks and porches should have guardrails on the edges.  Have any leaves, snow, or ice cleared regularly.  Use sand or salt on walking paths during winter.  Clean up any spills in your garage right away. This includes oil or grease spills. What can I do in the bathroom?  Use night lights.  Install grab bars by the toilet and in the tub and shower. Do not use towel bars as grab bars.  Use non-skid mats or decals in the tub or shower.  If you need to sit down in the shower, use a plastic, non-slip stool.  Keep the floor dry. Clean up any water that spills on the floor as soon as it happens.  Remove soap buildup in the tub or shower regularly.  Attach bath mats securely with double-sided non-slip rug tape.  Do not have  throw rugs and other things on the floor that can make you trip. What can I do in the bedroom?  Use night lights.  Make sure that you have a light by your bed that is easy to reach.  Do not use any sheets or blankets that are too big for your bed. They should not hang down onto the floor.  Have a firm chair that has side arms. You can use this for support while you get dressed.  Do not have throw rugs and other things on the floor that can make you trip. What can I do in the kitchen?  Clean up any spills right away.  Avoid walking on wet floors.  Keep items that you use a lot in easy-to-reach places.  If you need to reach something above you, use a strong step stool that has a grab bar.  Keep electrical cords out of the way.  Do not use floor polish or wax that makes floors slippery. If you must use wax, use non-skid floor wax.  Do not have throw rugs and other things on the floor that can make you trip. What can I do with my stairs?  Do not leave any items on the  stairs.  Make sure that there are handrails on both sides of the stairs and use them. Fix handrails that are broken or loose. Make sure that handrails are as long as the stairways.  Check any carpeting to make sure that it is firmly attached to the stairs. Fix any carpet that is loose or worn.  Avoid having throw rugs at the top or bottom of the stairs. If you do have throw rugs, attach them to the floor with carpet tape.  Make sure that you have a light switch at the top of the stairs and the bottom of the stairs. If you do not have them, ask someone to add them for you. What else can I do to help prevent falls?  Wear shoes that:  Do not have high heels.  Have rubber bottoms.  Are comfortable and fit you well.  Are closed at the toe. Do not wear sandals.  If you use a stepladder:  Make sure that it is fully opened. Do not climb a closed stepladder.  Make sure that both sides of the stepladder are  locked into place.  Ask someone to hold it for you, if possible.  Clearly mark and make sure that you can see:  Any grab bars or handrails.  First and last steps.  Where the edge of each step is.  Use tools that help you move around (mobility aids) if they are needed. These include:  Canes.  Walkers.  Scooters.  Crutches.  Turn on the lights when you go into a dark area. Replace any light bulbs as soon as they burn out.  Set up your furniture so you have a clear path. Avoid moving your furniture around.  If any of your floors are uneven, fix them.  If there are any pets around you, be aware of where they are.  Review your medicines with your doctor. Some medicines can make you feel dizzy. This can increase your chance of falling. Ask your doctor what other things that you can do to help prevent falls. This information is not intended to replace advice given to you by your health care provider. Make sure you discuss any questions you have with your health care provider. Document Released: 03/08/2009 Document Revised: 10/18/2015 Document Reviewed: 06/16/2014 Elsevier Interactive Patient Education  2017 [employer].

## 2017-10-05 ENCOUNTER — Ambulatory Visit: Payer: Medicare Other | Admitting: Pharmacotherapy

## 2017-10-06 ENCOUNTER — Encounter: Payer: Self-pay | Admitting: Internal Medicine

## 2017-10-06 ENCOUNTER — Ambulatory Visit (INDEPENDENT_AMBULATORY_CARE_PROVIDER_SITE_OTHER): Payer: Medicare Other | Admitting: Internal Medicine

## 2017-10-06 VITALS — BP 136/64 | HR 68 | Temp 97.5°F | Resp 18 | Ht 63.0 in | Wt 151.2 lb

## 2017-10-06 DIAGNOSIS — E782 Mixed hyperlipidemia: Secondary | ICD-10-CM

## 2017-10-06 DIAGNOSIS — R339 Retention of urine, unspecified: Secondary | ICD-10-CM

## 2017-10-06 DIAGNOSIS — J309 Allergic rhinitis, unspecified: Secondary | ICD-10-CM

## 2017-10-06 DIAGNOSIS — R42 Dizziness and giddiness: Secondary | ICD-10-CM

## 2017-10-06 DIAGNOSIS — I480 Paroxysmal atrial fibrillation: Secondary | ICD-10-CM | POA: Diagnosis not present

## 2017-10-06 DIAGNOSIS — Z952 Presence of prosthetic heart valve: Secondary | ICD-10-CM | POA: Diagnosis not present

## 2017-10-06 DIAGNOSIS — I10 Essential (primary) hypertension: Secondary | ICD-10-CM | POA: Diagnosis not present

## 2017-10-06 DIAGNOSIS — M5431 Sciatica, right side: Secondary | ICD-10-CM

## 2017-10-06 DIAGNOSIS — M48061 Spinal stenosis, lumbar region without neurogenic claudication: Secondary | ICD-10-CM | POA: Diagnosis not present

## 2017-10-06 NOTE — Progress Notes (Signed)
Patient ID: Jodi Frank, female   DOB: Sep 13, 1940, 77 y.o.   MRN: [id number]   [facility] OFF[facility]  Provider: DR Arletha Grippe  Code Status:  Goals of Care:  Advanced Directives 10/02/2017  Does Patient Have a Medical Advance Directive? Yes  Type of Paramedic of [place];Living will  Does patient want to make changes to medical advance directive? No - Patient declined  Copy of [place] in Chart? Yes  Would patient like information on creating a medical advance directive? -  Pre-existing out of facility DNR order (yellow form or pink MOST form) -     Chief Complaint  Patient presents with  . Medical Management of Chronic Issues    4 month follow up for cognitive impairment. Patient stated that she wants to discuss not taking Gabapentin and Tamulosin.   . Medication Refill    No refills needed at this time  . Health Maintenance    Due for tetanus and tdap. Patient has declined to receive the vaccines at this time     HPI: Patient is a 77 y.o. female seen today for medical management of chronic diseases.  She saw podiatry and Rx medrol dose pak but she does not know why. She completed AWV 10/02/17. She would like to stop taking her flomax for urinary retention. 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 She is a poor historian due to memory loss. Hx obtained from chart.  Dizziness - intermittent throughout the day but worse after taking metoprolol 50mg  daily. Meclizine helps but she  Has not taken it in several days. She does have hx dizziness unrelated to positional changes  Fall hx - she falls at least 1 time per yr. She drinks several glasses of wine at night which contributes to falls.  HTN - BP stable on losartan and metoprolol xl  Cognitive impairment -unchanged. It has worsened in last 2 yrs; associated combativeness. Weight is stable. Albumin 4.5  Depression/insomnia - mood  stable on trazodone. 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 Takes coumadin. INR GOAL 2.5-3.5 due to mechanical valve. Rate controlled with metoprolol. 2D echo revealed EF 65-70% in Dec 2018. INR 2.9 on  09/14/17. She is followed by coumadin clinic at [facility] She does not have a cardiologist at this time  Hx anemia - stable. Hgb 13.5  Osteoporosis - stable on fosamax. Last DXA 01/2016 revealed T score -2.9.  Hyperlipidemia - stable on lipitor and omega 3 fish oil. LDL 101; HDL 71  Hx abdominal atherosclerosis - on coumadin. INR at goal  HM - Her last Td 06/2005; Zostavax 07/2013   Past Medical History:  Diagnosis Date  . Broken hip ([place]) 10/2011   left  . Broken wrist   . Depression   . Diarrhea   . H/O long-term (current) use of anticoagulants   . High cholesterol   . Hypertension   . Memory difficulty 01/18/2016  . 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 Osteoporosis   . SIADH (syndrome of inappropriate ADH production) ([place])   . Urinary retention    chronic  . Vertigo 01/18/2016    Past Surgical History:  Procedure Laterality Date  . HEMATOMA EVACUATION Left 11/25/2012   Procedure: EVACUATION HEMATOMA;  Surgeon: Linna Hoff, MD;  Location: [place];  Service: Orthopedics;  Laterality: Left;  . HIP PINNING,CANNULATED Left 11/03/2012   Procedure: CANNULATED HIP PINNING;  Surgeon: Tobi Bastos, MD;  Location: [facility];  Service: Orthopedics;  Laterality: Left;  . IR INJECT/THERA/INC NEEDLE/CATH/PLC EPI/LUMB/SAC W/IMG  07/20/2017  . 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  . OPEN REDUCTION INTERNAL FIXATION (ORIF) WRIST WITH ILIAC CREST BONE GRAFT Left 11/24/2012   Procedure: OPEN REDUCTION INTERNAL FIXATION LEFT WRIST POSSIBLE BONE GRAFTING AND PINNING (ORIF) ;  Surgeon: Linna Hoff, MD;  Location: [place];  Service: Orthopedics;  Laterality: Left;  . SHOULDER OPEN ROTATOR CUFF  REPAIR       reports that she has never smoked. She has never used smokeless tobacco. She reports that she does not drink alcohol or use drugs. Social History   Socioeconomic History  . Marital status: Divorced    Spouse name: Not on file  . Number of children: 1  . Years of education: BS  . Highest education level: Not on file  Occupational History  . Occupation: Retired  Scientific laboratory technician  . Financial resource strain: Not hard at all  . Food insecurity:    Worry: Never true    Inability: Never true  . Transportation needs:    Medical: No    Non-medical: No  Tobacco Use  . Smoking status: Never Smoker  . Smokeless tobacco: Never Used  Substance and Sexual Activity  . Alcohol use: No    Frequency: Never  . Drug use: No  . Sexual activity: Not Currently  Lifestyle  . Physical activity:    Days per week: 0 days    Minutes per session: 0 min  . Stress: Not at all  Relationships  . Social connections:    Talks on phone: Twice a week    Gets together: Twice a week    Attends religious service: Never    Active member of club or organization: No    Attends meetings of clubs or organizations: Never    Relationship status: Divorced  . Intimate partner violence:    Fear of current or ex partner: No    Emotionally abused: No    Physically abused: No    Forced sexual activity: No  Other Topics Concern  . Not on file  Social History Narrative   Lives at home, alone   Right-handed   Caffeine: 2 cups per day    Family History  Problem Relation Age of Onset  . Stroke Mother   . Hypertension Mother   . Stroke Father   . Colon cancer Paternal Grandmother   . Parkinsonism Brother     Allergies  Allergen Reactions  . Carvedilol Itching  . Ace Inhibitors Other (See Comments)    Reaction (??)  . Amlodipine Swelling  . Hydrochlorothiazide Other (See Comments)    Hyponatremia     Outpatient Encounter Medications as of 10/06/2017  Medication Sig  . acetaminophen  (TYLENOL) 500 MG tablet Take 500-1,000 mg by mouth every 6 (six) hours as needed.  Marland Kitchen alendronate (FOSAMAX) 70 MG tablet Take 1 tablet (70 mg total) by mouth every Sunday. Take with a full glass of water on an empty stomach.  Marland Kitchen atorvastatin (LIPITOR) 10 MG tablet Take 1 tablet (10 mg total) by mouth at bedtime.  . Cholecalciferol (VITAMIN D3) 1000 units CAPS Take 1 capsule (1,000 Units total) by mouth daily.  Marland Kitchen gabapentin (NEURONTIN) 300 MG capsule Take 1 capsule (300 mg total) by mouth 2 (two) times daily.  Marland Kitchen  losartan (COZAAR) 100 MG tablet Take 1 tablet (100 mg total) by mouth daily.  . meclizine (ANTIVERT) 25 MG tablet Take 1 tablet (25 mg total) by mouth daily as needed for dizziness.  . methylPREDNISolone (MEDROL DOSEPAK) 4 MG TBPK tablet Take 4 mg by mouth 3 (three) times daily with meals.  . metoprolol succinate (TOPROL-XL) 50 MG 24 hr tablet Take 1 tablet (50 mg total) by mouth daily.  . Omega-3 Fatty Acids (OMEGA-3 FISH OIL) 1200 MG CAPS Take 2 capsules (2,400 mg total) by mouth 2 (two) times daily.  . tamsulosin (FLOMAX) 0.4 MG CAPS capsule Take 1 capsule (0.4 mg total) by mouth daily.  Marland Kitchen tiZANidine (ZANAFLEX) 4 MG tablet TAKE ONE TABLET BY MOUTH THREE TIMES A DAY AS NEEDED FOR MUSCLES SPASMS  --MAY CUT TABLET IN HALF  . traMADol (ULTRAM) 50 MG tablet Take 1 tablet (50 mg total) by mouth every 8 (eight) hours as needed.  . traZODone (DESYREL) 50 MG tablet Take 0.5-1 tablets (25-50 mg total) by mouth at bedtime as needed for sleep (1/2 to 1 tablet PRN).  Marland Kitchen warfarin (COUMADIN) 4 MG tablet Take 1 tablet daily except 1/2 tablet on Tuesdays   No facility-administered encounter medications on file as of 10/06/2017.     Review of Systems:  Review of Systems  Unable to perform ROS: Other (memory loss)    Health Maintenance  Topic Date Due  . TETANUS/TDAP  07/10/2015  . COLONOSCOPY  09/20/2015  . INFLUENZA VACCINE  12/24/2017  . DEXA SCAN  Completed  . PNA vac Low Risk Adult  Completed      Physical Exam: Vitals:   10/06/17 1127  BP: 136/64  Pulse: 68  Resp: 18  Temp: (!) 97.5 F (36.4 C)  TempSrc: Oral  SpO2: 97%  Weight: 151 lb 3.2 oz (68.6 kg)  Height: 5\' 3"  (1.6 m)   Body mass index is 26.78 kg/m. Physical Exam  Constitutional: She appears well-developed and well-nourished.  HENT:  Mouth/Throat: Oropharynx is clear and moist. No oropharyngeal exudate.  MMM; no oral thrush  Eyes: Pupils are equal, round, and reactive to light. No scleral icterus.  Neck: Neck supple. Carotid bruit is not present. No tracheal deviation present. No thyromegaly present.  Cardiovascular: Normal rate and intact distal pulses. An irregularly irregular rhythm present. Exam reveals no gallop and no friction rub.  Murmur heard.  Systolic murmur is present with a grade of 1/6. No LE edema b/l. no calf TTP.   Pulmonary/Chest: Effort normal and breath sounds normal. No stridor. No respiratory distress. She has no wheezes. She has no rales.  Abdominal: Soft. Normal appearance and bowel sounds are normal. She exhibits no distension and no mass. There is no hepatomegaly. There is no tenderness. There is no rigidity, no rebound and no guarding. No hernia.  obese  Lymphadenopathy:    She has no cervical adenopathy.  Neurological: She is alert. She has normal reflexes.  Skin: Skin is warm and dry. No rash noted.  Psychiatric: She has a normal mood and affect. Her behavior is normal. Thought content normal.    Labs reviewed: Basic Metabolic Panel: Recent Labs    02/27/17  07/17/17 0138  07/21/17 0528 07/22/17 0520 10/02/17 1000  NA 130*   < > 130*   < > 131* 130* 136  K 4.8   < > 4.4   < > 4.2 4.6 4.2  CL  --    < > 98*   < > 99* 98*  102  CO2  --    < > 23   < > 23 24 27   GLUCOSE  --    < > 110*   < > 119* 112* 105*  BUN 12   < > 15   < > 18 16 12   CREATININE 0.7   < > 0.59   < > 0.59 0.60 0.70  CALCIUM  --    < > 8.2*   < > 9.3 9.1 9.7  MG  --   --  2.1  --  1.9  --   --   TSH  0.62  --   --   --   --   --  0.72   < > = values in this interval not displayed.   Liver Function Tests: Recent Labs    07/18/17 0014 07/21/17 0528 07/22/17 0520 10/02/17 1000  AST 30 18 25 15   ALT 43 24 31 12   ALKPHOS 62 58 55  --   BILITOT 3.9* 2.4* 1.9* 0.9  PROT 6.4* 6.3* 6.1* 6.8  ALBUMIN 3.7 3.4* 3.3*  --    No results for input(s): LIPASE, AMYLASE in the last 8760 hours. Recent Labs    07/16/17 0519  AMMONIA 31   CBC: Recent Labs    07/16/17 0520  07/21/17 0528 07/22/17 0520 10/02/17 1000  WBC 21.3*   < > 27.1* 21.2* 8.7  NEUTROABS 16.6*  --  23.0*  --  6,012  HGB 8.4*   < > 10.4* 10.3* 13.5  HCT 24.9*   < > 31.2* 31.1* 41.7  MCV 85.6   < > 90.2 90.9 88.5  PLT 324   < > 332 332 353   < > = values in this interval not displayed.   Lipid Panel: Recent Labs    02/27/17 10/02/17 1000  CHOL 192 185  HDL 84* 71  LDLCALC 96 101*  TRIG 62 50  CHOLHDL  --  2.6   No results found for: HGBA1C  Procedures since last visit: No results found.  Assessment/Plan   ICD-10-CM   1. 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 H/O mitral valve replacement with mechanical valve Z95.2 Ambulatory referral to Cardiology  3. Paroxysmal atrial fibrillation (HCC) I48.0 Ambulatory referral to Cardiology  4. Urinary retention R33.9    RESOLVED  5. Mixed hyperlipidemia E78.2   6. Essential hypertension, benign I10   7. Right sided sciatica M54.31   8. Dizzinesses R42   9. Allergic rhinitis, unspecified seasonality, unspecified trigger J30.9      You may stop flomax. If you have trouble urinating, please restart medication  Will call with cardiology referral  Continue gabapentin for nerve pain and finish steroid taper  START PLAIN ZYRTEC, [place] ALLERGY/DIZZINESS  Continue other medications as ordered  Follow up with specialists as scheduled  Follow up in 3 mos for memory loss, HTN, PAF, allergic rhinitis.   Please schedule  INR appt with Janett Billow (for PAF)    Cordella Register. Perlie Gold  [facility] [address] [place], [place] [address] [phone] Cell (Monday-Friday 8 AM - 5 PM) [phone] After 5 PM and follow prompts

## 2017-10-06 NOTE — Patient Instructions (Addendum)
You may stop flomax. If you have trouble urinating, please restart medication  Will call with cardiology referral  Continue gabapentin for nerve pain and finish steroid taper  START PLAIN ZYRTEC, [place] ALLERGY/DIZZINESS  Continue other medications as ordered  Follow up with specialists as scheduled  Follow up in 3 mos for memory loss, HTN, PAF, allergic rhinitis.   Please schedule INR appt with Janett Billow (for PAF)

## 2017-10-07 ENCOUNTER — Ambulatory Visit (INDEPENDENT_AMBULATORY_CARE_PROVIDER_SITE_OTHER): Payer: Medicare Other | Admitting: Nurse Practitioner

## 2017-10-07 ENCOUNTER — Encounter: Payer: Self-pay | Admitting: Nurse Practitioner

## 2017-10-07 VITALS — BP 132/70 | HR 72 | Temp 98.1°F | Ht 63.0 in | Wt 152.0 lb

## 2017-10-07 DIAGNOSIS — Z952 Presence of prosthetic heart valve: Secondary | ICD-10-CM

## 2017-10-07 DIAGNOSIS — Z7901 Long term (current) use of anticoagulants: Secondary | ICD-10-CM

## 2017-10-07 DIAGNOSIS — I480 Paroxysmal atrial fibrillation: Secondary | ICD-10-CM | POA: Diagnosis not present

## 2017-10-07 LAB — POCT INR: INR: 2.6

## 2017-10-07 NOTE — Addendum Note (Signed)
Addended by: Rafael Bihari A on: 10/07/2017 04:21 PM   Modules accepted: Orders

## 2017-10-07 NOTE — Progress Notes (Signed)
Careteam: Patient Care Team: Gildardo Cranker, DO as PCP - General (Internal Medicine)    Allergies  Allergen Reactions  . Carvedilol Itching  . Ace Inhibitors Other (See Comments)    Reaction (??)  . Amlodipine Swelling  . Hydrochlorothiazide Other (See Comments)    Hyponatremia     Chief Complaint  Patient presents with  . Medical Management of Chronic Issues    Pt is being seen for an INR check. Current INR is 2.6, previous was 2.9 on 09/14/17.    HPI: Patient is a 77 y.o. female seen in the office today for INR check.  Last INR on 4/22 was 2.9 Today INR 2.6- has been on current dose for ~1 month GOAL 2.5-3.5 Taking Coumadin at 4mg  QD except 2mg  on Tuesdays Denies missed doses Denies unusual bleeding or bruising  Denies CP, falls Consistent with vitamin K intake  Review of Systems:  Review of Systems  Constitutional: Negative for chills, diaphoresis, fever and malaise/fatigue.  HENT: Negative for nosebleeds.   Respiratory: Negative for cough, hemoptysis and shortness of breath.   Cardiovascular: Negative for chest pain and leg swelling.  Gastrointestinal: Negative for blood in stool and melena.  Genitourinary: Negative for dysuria and hematuria.  Musculoskeletal: Negative for falls.  Neurological: Negative for tingling.  Endo/Heme/Allergies: Bruises/bleeds easily.    Past Medical History:  Diagnosis Date  . Broken hip ([place]) 10/2011   left  . Broken wrist   . Depression   . Diarrhea   . H/O long-term (current) use of anticoagulants   . High cholesterol   . Hypertension   . Memory difficulty 01/18/2016  . 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 Osteoporosis   . SIADH (syndrome of inappropriate ADH production) ([place])   . Urinary retention    chronic  . Vertigo 01/18/2016   Past Surgical History:  Procedure Laterality Date  . HEMATOMA EVACUATION Left 11/25/2012   Procedure: EVACUATION HEMATOMA;  Surgeon: Linna Hoff, MD;  Location: [place];   Service: Orthopedics;  Laterality: Left;  . HIP PINNING,CANNULATED Left 11/03/2012   Procedure: CANNULATED HIP PINNING;  Surgeon: Tobi Bastos, MD;  Location[facility];  Service: Orthopedics;  Laterality: Left;  . IR INJECT/THERA/INC NEEDLE/CATH/PLC EPI/LUMB/SAC W/IMG  07/20/2017  . 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  . OPEN REDUCTION INTERNAL FIXATION (ORIF) WRIST WITH ILIAC CREST BONE GRAFT Left 11/24/2012   Procedure: OPEN REDUCTION INTERNAL FIXATION LEFT WRIST POSSIBLE BONE GRAFTING AND PINNING (ORIF) ;  Surgeon: Linna Hoff, MD;  Location: [place];  Service: Orthopedics;  Laterality: Left;  . SHOULDER OPEN ROTATOR CUFF REPAIR     Social History:   reports that she has never smoked. She has never used smokeless tobacco. She reports that she does not drink alcohol or use drugs.  Family History  Problem Relation Age of Onset  . Stroke Mother   . Hypertension Mother   . Stroke Father   . Colon cancer Paternal Grandmother   . Parkinsonism Brother     Medications: Patient's Medications  New Prescriptions   No medications on file  Previous Medications   ACETAMINOPHEN (TYLENOL) 500 MG TABLET    Take 500-1,000 mg by mouth every 6 (six) hours as needed.   ALENDRONATE (FOSAMAX) 70 MG TABLET    Take 1 tablet (70 mg total) by mouth every Sunday. Take with a full glass of water on an empty stomach.   ATORVASTATIN (LIPITOR) 10 MG  TABLET    Take 1 tablet (10 mg total) by mouth at bedtime.   CHOLECALCIFEROL (VITAMIN D3) 1000 UNITS CAPS    Take 1 capsule (1,000 Units total) by mouth daily.   GABAPENTIN (NEURONTIN) 300 MG CAPSULE    Take 1 capsule (300 mg total) by mouth 2 (two) times daily.   LOSARTAN (COZAAR) 100 MG TABLET    Take 1 tablet (100 mg total) by mouth daily.   MECLIZINE (ANTIVERT) 25 MG TABLET    Take 1 tablet (25 mg total) by mouth daily as needed for dizziness.   METOPROLOL SUCCINATE (TOPROL-XL) 50 MG 24 HR TABLET    Take 1 tablet (50 mg total) by mouth daily.    OMEGA-3 FATTY ACIDS (OMEGA-3 FISH OIL) 1200 MG CAPS    Take 2 capsules (2,400 mg total) by mouth 2 (two) times daily.   TIZANIDINE (ZANAFLEX) 4 MG TABLET    TAKE ONE TABLET BY MOUTH THREE TIMES A DAY AS NEEDED FOR MUSCLES SPASMS  --MAY CUT TABLET IN HALF   TRAMADOL (ULTRAM) 50 MG TABLET    Take 1 tablet (50 mg total) by mouth every 8 (eight) hours as needed.   TRAZODONE (DESYREL) 50 MG TABLET    Take 0.5-1 tablets (25-50 mg total) by mouth at bedtime as needed for sleep (1/2 to 1 tablet PRN).   WARFARIN (COUMADIN) 4 MG TABLET    Take 1 tablet daily except 1/2 tablet on Tuesdays  Modified Medications   No medications on file  Discontinued Medications   METHYLPREDNISOLONE (MEDROL DOSEPAK) 4 MG TBPK TABLET    Take 4 mg by mouth 3 (three) times daily with meals.   TAMSULOSIN (FLOMAX) 0.4 MG CAPS CAPSULE    Take 1 capsule (0.4 mg total) by mouth daily.     Physical Exam:  Vitals:   10/07/17 1517  BP: 132/70  Pulse: 72  Temp: 98.1 F (36.7 C)  TempSrc: Oral  SpO2: 97%  Weight: 152 lb (68.9 kg)  Height: 5\' 3"  (1.6 m)   Body mass index is 26.93 kg/m.  Physical Exam  Constitutional: She appears well-developed and well-nourished.  HENT:  MMM; no oral thrush  Eyes: Pupils are equal, round, and reactive to light.  Neck: Neck supple. Carotid bruit is not present. No tracheal deviation present.  Cardiovascular: Normal rate and intact distal pulses. An irregularly irregular rhythm present. Exam reveals no gallop and no friction rub.  Murmur heard.  Systolic murmur is present with a grade of 1/6. Pulmonary/Chest: Effort normal and breath sounds normal.  Abdominal: Normal appearance. There is no hepatomegaly. There is no rigidity.  Musculoskeletal: She exhibits no edema.  Neurological: She is alert. She has normal reflexes.  Skin: Skin is warm and dry. No rash noted.  Psychiatric: She has a normal mood and affect. Her behavior is normal. Thought content normal.    Labs reviewed: Basic  Metabolic Panel: Recent Labs    02/27/17  07/17/17 0138  07/21/17 0528 07/22/17 0520 10/02/17 1000  NA 130*   < > 130*   < > 131* 130* 136  K 4.8   < > 4.4   < > 4.2 4.6 4.2  CL  --    < > 98*   < > 99* 98* 102  CO2  --    < > 23   < > 23 24 27   GLUCOSE  --    < > 110*   < > 119* 112* 105*  BUN 12   < >  15   < > 18 16 12   CREATININE 0.7   < > 0.59   < > 0.59 0.60 0.70  CALCIUM  --    < > 8.2*   < > 9.3 9.1 9.7  MG  --   --  2.1  --  1.9  --   --   TSH 0.62  --   --   --   --   --  0.72   < > = values in this interval not displayed.   Liver Function Tests: Recent Labs    07/18/17 0014 07/21/17 0528 07/22/17 0520 10/02/17 1000  AST 30 18 25 15   ALT 43 24 31 12   ALKPHOS 62 58 55  --   BILITOT 3.9* 2.4* 1.9* 0.9  PROT 6.4* 6.3* 6.1* 6.8  ALBUMIN 3.7 3.4* 3.3*  --    No results for input(s): LIPASE, AMYLASE in the last 8760 hours. Recent Labs    07/16/17 0519  AMMONIA 31   CBC: Recent Labs    07/16/17 0520  07/21/17 0528 07/22/17 0520 10/02/17 1000  WBC 21.3*   < > 27.1* 21.2* 8.7  NEUTROABS 16.6*  --  23.0*  --  6,012  HGB 8.4*   < > 10.4* 10.3* 13.5  HCT 24.9*   < > 31.2* 31.1* 41.7  MCV 85.6   < > 90.2 90.9 88.5  PLT 324   < > 332 332 353   < > = values in this interval not displayed.   Lipid Panel: Recent Labs    02/27/17 10/02/17 1000  CHOL 192 185  HDL 84* 71  LDLCALC 96 101*  TRIG 62 50  CHOLHDL  --  2.6   TSH: Recent Labs    02/27/17 10/02/17 1000  TSH 0.62 0.72   A1C: No results found for: HGBA1C   Assessment/Plan 1. Long term (current) use of anticoagulants 2. H/O mitral valve replacement with mechanical valve 3. Paroxysmal atrial fibrillation (HCC) INR at goal at 2.6, will continue current coumadin dosing at 4mg  QD except 2mg  on Tuesdays  Next appt: 4 weeks for INR check  Jessica K. Norton, [facility] [phone]

## 2017-10-07 NOTE — Patient Instructions (Signed)
INR 2.6 Coumadin 4mg  daily except 2mg  on Tuesdays

## 2017-10-08 ENCOUNTER — Encounter: Payer: Self-pay | Admitting: Internal Medicine

## 2017-10-08 NOTE — Telephone Encounter (Signed)
Please reply back to patient

## 2017-10-15 ENCOUNTER — Encounter: Payer: Self-pay | Admitting: Internal Medicine

## 2017-10-15 ENCOUNTER — Other Ambulatory Visit: Payer: Self-pay | Admitting: Pharmacotherapy

## 2017-10-15 DIAGNOSIS — Z7901 Long term (current) use of anticoagulants: Secondary | ICD-10-CM

## 2017-10-15 DIAGNOSIS — Z952 Presence of prosthetic heart valve: Secondary | ICD-10-CM

## 2017-10-15 NOTE — Telephone Encounter (Signed)
Medication can changed based on INR readings

## 2017-10-28 ENCOUNTER — Encounter: Payer: Self-pay | Admitting: Internal Medicine

## 2017-10-28 NOTE — Telephone Encounter (Signed)
Routed to Dr.Carter to advise, this is an ongoing concern. Patient seen recently.   S.Chrae B/CMA

## 2017-10-30 ENCOUNTER — Telehealth: Payer: Self-pay | Admitting: *Deleted

## 2017-10-30 ENCOUNTER — Encounter: Payer: Self-pay | Admitting: Internal Medicine

## 2017-10-30 NOTE — Telephone Encounter (Signed)
.  left message to have patient return my call.       Please call her and get more information. I do not understand what she is trying to ask.    ----- Message -----  From: Despina Hidden, CMA  Sent: 10/30/2017 10:24 AM  To: Gildardo Cranker, DO  Subject: Visit Follow-Up Question               ----- Message from Despina Hidden, [place] sent at 10/30/2017 10:24 AM EDT -----      ----- Message from Tod Persia to Gildardo Cranker, DO sent at 10/30/2017 10:19 AM -----   So what meds do I no longer take and is there anything else I can do? Losarten, which I take in the morning, seems to be a problem.  Thanks, Jodi Frank

## 2017-11-02 ENCOUNTER — Ambulatory Visit: Payer: PRIVATE HEALTH INSURANCE | Admitting: Neurology

## 2017-11-03 DIAGNOSIS — G5761 Lesion of plantar nerve, right lower limb: Secondary | ICD-10-CM | POA: Diagnosis not present

## 2017-11-03 DIAGNOSIS — M79671 Pain in right foot: Secondary | ICD-10-CM | POA: Diagnosis not present

## 2017-11-03 NOTE — Telephone Encounter (Signed)
I recommend that she takes all medication as ordered. Before she was saying the metoprolol was causing dizziness and now it's the losartan?! Please do not stop either medication. Please follow up with cardiology

## 2017-11-03 NOTE — Telephone Encounter (Signed)
Patient called and stated that the Losartan is causing her to be dizzy. She takes it in the morning once daily. No other symptoms. Has not been checking blood pressures. Wants to have Losartan changed. Stated it is making her crazy the dizziness. Please Advise.

## 2017-11-03 NOTE — Telephone Encounter (Signed)
Patient notified and agreed. Has an appointment with Cardiology on Friday.

## 2017-11-09 ENCOUNTER — Encounter: Payer: Self-pay | Admitting: Nurse Practitioner

## 2017-11-09 ENCOUNTER — Ambulatory Visit (INDEPENDENT_AMBULATORY_CARE_PROVIDER_SITE_OTHER): Payer: Medicare Other | Admitting: Nurse Practitioner

## 2017-11-09 VITALS — BP 152/92 | HR 74 | Temp 98.7°F | Ht 63.0 in | Wt 155.0 lb

## 2017-11-09 DIAGNOSIS — Z7901 Long term (current) use of anticoagulants: Secondary | ICD-10-CM

## 2017-11-09 DIAGNOSIS — Z952 Presence of prosthetic heart valve: Secondary | ICD-10-CM | POA: Diagnosis not present

## 2017-11-09 DIAGNOSIS — I10 Essential (primary) hypertension: Secondary | ICD-10-CM | POA: Diagnosis not present

## 2017-11-09 LAB — POCT INR: INR: 2.3 (ref 2.0–3.0)

## 2017-11-09 MED ORDER — METOPROLOL SUCCINATE ER 50 MG PO TB24: 50.0000 mg | ORAL_TABLET | Freq: Every day | ORAL | 1 refills | Status: DC

## 2017-11-09 MED ORDER — WARFARIN SODIUM 4 MG PO TABS: 4.0000 mg | ORAL_TABLET | Freq: Every day | ORAL | 1 refills | Status: DC

## 2017-11-09 NOTE — Progress Notes (Signed)
Careteam: Patient Care Team: Gildardo Cranker, DO as PCP - General (Internal Medicine)  Advanced Directive information Does Patient Have a Medical Advance Directive?: Yes, Type of Advance Directive: [place];Living will  Allergies  Allergen Reactions  . Carvedilol Itching  . Ace Inhibitors Other (See Comments)    Reaction (??)  . Amlodipine Swelling  . Hydrochlorothiazide Other (See Comments)    Hyponatremia     Chief Complaint  Patient presents with  . Medical Management of Chronic Issues    Pt is being seen for an INR check. Current INR is 2.3. Previous was 2.6     HPI: Patient is a 77 y.o. female seen in the office today for INR check.  Reports she has quit taking her metoprolol succinate- states she does not know why she stopped it because it was not agreeing with her. Weaned off of medication. Reports she is having dizziness due to losartan, (only dizzy after she takes it) States she plans on talking to cardiologist about this later this week. Blood pressure is elevated. Does not take blood pressure at home.  Pt with hx of mitral value replacement, a fib on long term coumadin.  Last INR 2.6 currently on coumadin 4 mg daily except 2 mg on tuesdays.  GOAL 2.5-3.5 No missed doses, no changes in diet, no chest pains or shortness of breath. No falls. No bleeding or bruising.   Review of Systems:  Review of Systems  Constitutional: Negative for chills, diaphoresis, fever and malaise/fatigue.  HENT: Negative for nosebleeds.   Respiratory: Negative for cough, hemoptysis and shortness of breath.   Cardiovascular: Negative for chest pain and leg swelling.  Gastrointestinal: Negative for blood in stool and melena.  Genitourinary: Negative for dysuria and hematuria.  Musculoskeletal: Negative for falls.  Neurological: Positive for dizziness. Negative for tingling.  Endo/Heme/Allergies: Bruises/bleeds easily.    Past Medical History:  Diagnosis Date  .  Broken hip ([place]) 10/2011   left  . Broken wrist   . Depression   . Diarrhea   . H/O long-term (current) use of anticoagulants   . High cholesterol   . Hypertension   . Memory difficulty 01/18/2016  . 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 Osteoporosis   . SIADH (syndrome of inappropriate ADH production) ([place])   . Urinary retention    chronic  . Vertigo 01/18/2016   Past Surgical History:  Procedure Laterality Date  . HEMATOMA EVACUATION Left 11/25/2012   Procedure: EVACUATION HEMATOMA;  Surgeon: Linna Hoff, MD;  Location: [facility];  Service: Orthopedics;  Laterality: Left;  . HIP PINNING,CANNULATED Left 11/03/2012   Procedure: CANNULATED HIP PINNING;  Surgeon: Tobi Bastos, MD;  Location: [facility];  Service: Orthopedics;  Laterality: Left;  . IR INJECT/THERA/INC NEEDLE/CATH/PLC EPI/LUMB/SAC W/IMG  07/20/2017  . 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  . OPEN REDUCTION INTERNAL FIXATION (ORIF) WRIST WITH ILIAC CREST BONE GRAFT Left 11/24/2012   Procedure: OPEN REDUCTION INTERNAL FIXATION LEFT WRIST POSSIBLE BONE GRAFTING AND PINNING (ORIF) ;  Surgeon: Linna Hoff, MD;  Location: [facility];  Service: Orthopedics;  Laterality: Left;  . SHOULDER OPEN ROTATOR CUFF REPAIR     Social History:   reports that she has never smoked. She has never used smokeless tobacco. She reports that she does not drink alcohol or use drugs.  Family History  Problem Relation Age of Onset  . Stroke Mother   . Hypertension Mother   .  Stroke Father   . Colon cancer Paternal Grandmother   . Parkinsonism Brother     Medications: Patient's Medications  New Prescriptions   No medications on file  Previous Medications   ACETAMINOPHEN (TYLENOL) 500 MG TABLET    Take 500-1,000 mg by mouth every 6 (six) hours as needed.   ALENDRONATE (FOSAMAX) 70 MG TABLET    Take 1 tablet (70 mg total) by mouth every Sunday. Take with a full glass of water on an empty stomach.   ATORVASTATIN (LIPITOR) 10 MG  TABLET    Take 1 tablet (10 mg total) by mouth at bedtime.   CHOLECALCIFEROL (VITAMIN D3) 1000 UNITS CAPS    Take 1 capsule (1,000 Units total) by mouth daily.   LOSARTAN (COZAAR) 100 MG TABLET    Take 1 tablet (100 mg total) by mouth daily.   MECLIZINE (ANTIVERT) 25 MG TABLET    Take 1 tablet (25 mg total) by mouth daily as needed for dizziness.   METOPROLOL SUCCINATE (TOPROL-XL) 50 MG 24 HR TABLET    Take 1 tablet (50 mg total) by mouth daily.   OMEGA-3 FATTY ACIDS (OMEGA-3 FISH OIL) 1200 MG CAPS    Take 2 capsules (2,400 mg total) by mouth 2 (two) times daily.   TIZANIDINE (ZANAFLEX) 4 MG TABLET    TAKE ONE TABLET BY MOUTH THREE TIMES A DAY AS NEEDED FOR MUSCLES SPASMS  --MAY CUT TABLET IN HALF   TRAMADOL (ULTRAM) 50 MG TABLET    Take 1 tablet (50 mg total) by mouth every 8 (eight) hours as needed.   TRAZODONE (DESYREL) 50 MG TABLET    Take 0.5-1 tablets (25-50 mg total) by mouth at bedtime as needed for sleep (1/2 to 1 tablet PRN).   WARFARIN (COUMADIN) 4 MG TABLET    TAKE ONE TABLET BY MOUTH ONCE DAILY EXCEPT TAKE ONE-HALF TABLET ON TUESDAYS  Modified Medications   No medications on file  Discontinued Medications   GABAPENTIN (NEURONTIN) 300 MG CAPSULE    Take 1 capsule (300 mg total) by mouth 2 (two) times daily.     Physical Exam:  Vitals:   11/09/17 1020  BP: (!) 152/92  Pulse: 74  Temp: 98.7 F (37.1 C)  TempSrc: Oral  SpO2: 97%  Weight: 155 lb (70.3 kg)  Height: 5\' 3"  (1.6 m)   Body mass index is 27.46 kg/m.  Physical Exam  Constitutional: She appears well-developed and well-nourished.  HENT:  MMM; no oral thrush  Eyes: Pupils are equal, round, and reactive to light.  Neck: Neck supple. Carotid bruit is not present. No tracheal deviation present.  Cardiovascular: Normal rate and intact distal pulses. An irregularly irregular rhythm present. Exam reveals no gallop and no friction rub.  Murmur heard.  Systolic murmur is present with a grade of 1/6. Pulmonary/Chest:  Effort normal and breath sounds normal.  Abdominal: Normal appearance. There is no hepatomegaly. There is no rigidity.  Musculoskeletal: She exhibits no edema.  Neurological: She is alert. She has normal reflexes.  Skin: Skin is warm and dry. No rash noted.  Psychiatric: She has a normal mood and affect. Her behavior is normal. Thought content normal.    Labs reviewed: Basic Metabolic Panel: Recent Labs    02/27/17  07/17/17 0138  07/21/17 0528 07/22/17 0520 10/02/17 1000  NA 130*   < > 130*   < > 131* 130* 136  K 4.8   < > 4.4   < > 4.2 4.6 4.2  CL  --    < >  98*   < > 99* 98* 102  CO2  --    < > 23   < > 23 24 27   GLUCOSE  --    < > 110*   < > 119* 112* 105*  BUN 12   < > 15   < > 18 16 12   CREATININE 0.7   < > 0.59   < > 0.59 0.60 0.70  CALCIUM  --    < > 8.2*   < > 9.3 9.1 9.7  MG  --   --  2.1  --  1.9  --   --   TSH 0.62  --   --   --   --   --  0.72   < > = values in this interval not displayed.   Liver Function Tests: Recent Labs    07/18/17 0014 07/21/17 0528 07/22/17 0520 10/02/17 1000  AST 30 18 25 15   ALT 43 24 31 12   ALKPHOS 62 58 55  --   BILITOT 3.9* 2.4* 1.9* 0.9  PROT 6.4* 6.3* 6.1* 6.8  ALBUMIN 3.7 3.4* 3.3*  --    No results for input(s): LIPASE, AMYLASE in the last 8760 hours. Recent Labs    07/16/17 0519  AMMONIA 31   CBC: Recent Labs    07/16/17 0520  07/21/17 0528 07/22/17 0520 10/02/17 1000  WBC 21.3*   < > 27.1* 21.2* 8.7  NEUTROABS 16.6*  --  23.0*  --  6,012  HGB 8.4*   < > 10.4* 10.3* 13.5  HCT 24.9*   < > 31.2* 31.1* 41.7  MCV 85.6   < > 90.2 90.9 88.5  PLT 324   < > 332 332 353   < > = values in this interval not displayed.   Lipid Panel: Recent Labs    02/27/17 10/02/17 1000  CHOL 192 185  HDL 84* 71  LDLCALC 96 101*  TRIG 62 50  CHOLHDL  --  2.6   TSH: Recent Labs    02/27/17 10/02/17 1000  TSH 0.62 0.72   A1C: No results found for: HGBA1C   Assessment/Plan 1. Long term (current) use of  anticoagulants Due to hx of mechanical valve and a fib - POC INR 2.3 today, goal 2.5-3.5 on coumadin 4 mg daily except 2 mg on Tuesday, will increase coumadin  - warfarin (COUMADIN) 4 MG tablet; Take 1 tablet (4 mg total) by mouth daily.  Dispense: 30 tablet; Refill: 1  2. Essential hypertension, benign Not controlled, stopped metoprolol but unsure why.  Having dizziness related to losartan but improved in the evening. Encouraged to take losartan in the evening before bed to help with dizziness.  -willing to restart metoprolol as she is unaware of why she stopped it. -will follow up later this week with cardiologist on blood pressure - metoprolol succinate (TOPROL-XL) 50 MG 24 hr tablet; Take 1 tablet (50 mg total) by mouth daily.  Dispense: 90 tablet; Refill: 1  3. H/O mitral valve replacement with mechanical valve INR goal 2.5-3.5, INR today 2.3, will increase coumadin to 4 mg daily - warfarin (COUMADIN) 4 MG tablet; Take 1 tablet (4 mg total) by mouth daily.  Dispense: 30 tablet; Refill: 1  Next appt: 2 weeks for blood pressure and INR.  Carlos American. [place], [facility] [phone]

## 2017-11-09 NOTE — Patient Instructions (Addendum)
Start taking losartan at night before bed.   Restart metoprolol succinate daily- prescription sent to pharmacy   INR was 2.3, increase coumadin to 4 mg daily-- recheck INR in 2 weeks.

## 2017-11-13 ENCOUNTER — Ambulatory Visit (INDEPENDENT_AMBULATORY_CARE_PROVIDER_SITE_OTHER): Payer: Medicare Other | Admitting: Cardiology

## 2017-11-13 ENCOUNTER — Encounter: Payer: Self-pay | Admitting: Cardiology

## 2017-11-13 VITALS — BP 156/72 | HR 90 | Ht 63.0 in | Wt 155.8 lb

## 2017-11-13 DIAGNOSIS — I10 Essential (primary) hypertension: Secondary | ICD-10-CM

## 2017-11-13 DIAGNOSIS — Z952 Presence of prosthetic heart valve: Secondary | ICD-10-CM | POA: Diagnosis not present

## 2017-11-13 DIAGNOSIS — I480 Paroxysmal atrial fibrillation: Secondary | ICD-10-CM

## 2017-11-13 MED ORDER — ASPIRIN EC 81 MG PO TBEC: 81.0000 mg | DELAYED_RELEASE_TABLET | Freq: Every day | ORAL | 3 refills | Status: AC

## 2017-11-13 NOTE — Progress Notes (Signed)
Cardiology Office Note    Date:  11/13/2017   ID:  Jodi Frank, DOB 03/05/1941, MRN [id number]  PCP:  Gildardo Cranker, DO  Cardiologist:  Fransico Him, MD   Chief Complaint  Patient presents with  . Mitral Regurgitation  . Atrial Fibrillation    History of Present Illness:  Jodi Frank is a 77 y.o. female who is being seen today for the evaluation of Mitral regurgitation status post mechanical MVR and paroxysmal atrial fibrillation at the request of Gildardo Cranker, DO.  This is a 77 year old female with a history of mitral regurgitation status post remote mechanical MVR on chronic anticoagulation as well as paroxysmal atrial fibrillation.  Her INR is are managed by her PCP.  She is now referred to establish cardiac care.  Her last echo done 05/05/2017 showed normal LV function with EF 65 to 70%, mild AR, stable mechanical MVR with mitral valve area of 1.57 cm and mean gradient 13 mmHg, moderate pulmonary hypertension with PASP 56 mmHg.  She is here today for followup and is doing well.  She denies any chest pain or pressure, SOB, DOE, PND, orthopnea, LE edema, dizziness, palpitations or syncope. She is compliant with her meds and is tolerating meds with no SE.      Past Medical History:  Diagnosis Date  . Broken hip ([place]) 10/2011   left  . Broken wrist   . Depression   . Diarrhea   . H/O long-term (current) use of anticoagulants   . High cholesterol   . Hypertension   . Memory difficulty 01/18/2016  . 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 Osteoporosis   . SIADH (syndrome of inappropriate ADH production) ([place])   . Urinary retention    chronic  . Vertigo 01/18/2016    Past Surgical History:  Procedure Laterality Date  . HEMATOMA EVACUATION Left 11/25/2012   Procedure: EVACUATION HEMATOMA;  Surgeon: Linna Hoff, MD;  Location: [facility];  Service: Orthopedics;  Laterality: Left;  . HIP PINNING,CANNULATED Left 11/03/2012   Procedure: CANNULATED HIP PINNING;  Surgeon:  Tobi Bastos, MD;  [facility];  Service: Orthopedics;  Laterality: Left;  . IR INJECT/THERA/INC NEEDLE/CATH/PLC EPI/LUMB/SAC W/IMG  07/20/2017  . 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  . OPEN REDUCTION INTERNAL FIXATION (ORIF) WRIST WITH ILIAC CREST BONE GRAFT Left 11/24/2012   Procedure: OPEN REDUCTION INTERNAL FIXATION LEFT WRIST POSSIBLE BONE GRAFTING AND PINNING (ORIF) ;  Surgeon: Linna Hoff, MD;  Location: [facility];  Service: Orthopedics;  Laterality: Left;  . SHOULDER OPEN ROTATOR CUFF REPAIR      Current Medications: Current Meds  Medication Sig  . acetaminophen (TYLENOL) 500 MG tablet Take 500-1,000 mg by mouth every 6 (six) hours as needed.  Marland Kitchen alendronate (FOSAMAX) 70 MG tablet Take 1 tablet (70 mg total) by mouth every Sunday. Take with a full glass of water on an empty stomach.  Marland Kitchen atorvastatin (LIPITOR) 10 MG tablet Take 1 tablet (10 mg total) by mouth at bedtime.  . Cholecalciferol (VITAMIN D3) 1000 units CAPS Take 1 capsule (1,000 Units total) by mouth daily.  Marland Kitchen losartan (COZAAR) 100 MG tablet Take 1 tablet (100 mg total) by mouth daily.  . meclizine (ANTIVERT) 25 MG tablet Take 1 tablet (25 mg total) by mouth daily as needed for dizziness.  . metoprolol succinate (TOPROL-XL) 50 MG 24 hr tablet Take 1 tablet (50 mg total) by mouth daily.  . Omega-3  Fatty Acids (OMEGA-3 FISH OIL) 1200 MG CAPS Take 2 capsules (2,400 mg total) by mouth 2 (two) times daily.  Marland Kitchen tiZANidine (ZANAFLEX) 4 MG tablet TAKE ONE TABLET BY MOUTH THREE TIMES A DAY AS NEEDED FOR MUSCLES SPASMS  --MAY CUT TABLET IN HALF  . traMADol (ULTRAM) 50 MG tablet Take 1 tablet (50 mg total) by mouth every 8 (eight) hours as needed.  . traZODone (DESYREL) 50 MG tablet Take 0.5-1 tablets (25-50 mg total) by mouth at bedtime as needed for sleep (1/2 to 1 tablet PRN).  Marland Kitchen warfarin (COUMADIN) 4 MG tablet Take 1 tablet (4 mg total) by mouth daily.    Allergies:   Carvedilol; Ace inhibitors; Amlodipine; and  Hydrochlorothiazide   Social History   Socioeconomic History  . Marital status: Divorced    Spouse name: Not on file  . Number of children: 1  . Years of education: BS  . Highest education level: Not on file  Occupational History  . Occupation: Retired  Scientific laboratory technician  . Financial resource strain: Not hard at all  . Food insecurity:    Worry: Never true    Inability: Never true  . Transportation needs:    Medical: No    Non-medical: No  Tobacco Use  . Smoking status: Never Smoker  . Smokeless tobacco: Never Used  Substance and Sexual Activity  . Alcohol use: No    Frequency: Never  . Drug use: No  . Sexual activity: Not Currently  Lifestyle  . Physical activity:    Days per week: 0 days    Minutes per session: 0 min  . Stress: Not at all  Relationships  . Social connections:    Talks on phone: Twice a week    Gets together: Twice a week    Attends religious service: Never    Active member of club or organization: No    Attends meetings of clubs or organizations: Never    Relationship status: Divorced  Other Topics Concern  . Not on file  Social History Narrative   Lives at home, alone   Right-handed   Caffeine: 2 cups per day     Family History:  The patient's family history includes Colon cancer in her paternal grandmother; Hypertension in her mother; Parkinsonism in her brother; Stroke in her father and mother.   ROS:   Please see the history of present illness.    ROS All other systems reviewed and are negative.  No flowsheet data found.     PHYSICAL EXAM:   VS:  BP (!) 156/72   Pulse 90   Ht 5\' 3"  (1.6 m)   Wt 155 lb 12.8 oz (70.7 kg)   SpO2 96%   BMI 27.60 kg/m    GEN: Well nourished, well developed, in no acute distress  HEENT: normal  Neck: no JVD, carotid bruits, or masses Cardiac: RRR; no murmurs, rubs, or gallops,no edema.  Intact distal pulses bilaterally.  Respiratory:  clear to auscultation bilaterally, normal work of breathing GI:  soft, nontender, nondistended, + BS MS: no deformity or atrophy  Skin: warm and dry, no rash Neuro:  Alert and Oriented x 3, Strength and sensation are intact Psych: euthymic mood, full affect  Wt Readings from Last 3 Encounters:  11/13/17 155 lb 12.8 oz (70.7 kg)  11/09/17 155 lb (70.3 kg)  10/07/17 152 lb (68.9 kg)      Studies/Labs Reviewed:   EKG:  EKG is not ordered today.    Recent Labs:  07/21/2017: Magnesium 1.9 10/02/2017: ALT 12; BUN 12; Creat 0.70; Hemoglobin 13.5; Platelets 353; Potassium 4.2; Sodium 136; TSH 0.72   Lipid Panel    Component Value Date/Time   CHOL 185 10/02/2017 1000   TRIG 50 10/02/2017 1000   HDL 71 10/02/2017 1000   CHOLHDL 2.6 10/02/2017 1000   LDLCALC 101 (H) 10/02/2017 1000    Additional studies/ records that were reviewed today include:  Office notes from PCP    ASSESSMENT:    1. Paroxysmal atrial fibrillation (HCC)   2. Essential hypertension, benign   3. H/O mitral valve replacement with mechanical valve      PLAN:  In order of problems listed above:  1.   Persistent atrial fibrillation -heart rate is well controlled on exam today.  She will continue on Toprol XL 50 mg daily and warfarin followed by her PCP.  2.   Hypertension -BP is borderline controlled on exam today.  She will continue on losartan 100 mg daily and Toprol-XL 50 mg daily.  3.   Mitral regurgitation status post mechanical mitral valve replacement.  She will continue on warfarin.  She has not been on aspirin which I will add aspirin 81 mg daily since she has a mechanical mitral valve.  She had an echo back in December which showed a mean mitral valve gradient of 13 mmHg and mitral valve area 1.57 cm.  I will try to get a copy of the echo report from [facility] that was done a year ago.  Unfortunately it is not in care everywhere.  She is completely asymptomatic with no shortness of breath, PND, orthopnea, exertional fatigue.  I will repeat a yearly  echo 04/2018.    Medication Adjustments/Labs and Tests Ordered: Current medicines are reviewed at length with the patient today.  Concerns regarding medicines are outlined above.  Medication changes, Labs and Tests ordered today are listed in the Patient Instructions below.  There are no Patient Instructions on file for this visit.   Signed, Fransico Him, MD  11/13/2017 10:36 AM    [address], [place], [place]  [address] Phone: [phone]; Fax: [phone]

## 2017-11-13 NOTE — Patient Instructions (Signed)
Medication Instructions:  Your physician has recommended you make the following change in your medication:  START: aspirin 81 mg once a day    Labwork: None ordered   Testing/Procedures: Your physician has requested that you have an echocardiogram December 2019. Echocardiography is a painless test that uses sound waves to create images of your heart. It provides your doctor with information about the size and shape of your heart and how well your heart's chambers and valves are working. This procedure takes approximately one hour. There are no restrictions for this procedure.   Follow-Up: Your physician wants you to follow-up in: 1 year with Dr. Radford Pax. You will receive a reminder letter in the mail two months in advance. If you don't receive a letter, please call our office to schedule the follow-up appointment.  Any Other Special Instructions Will Be Listed Below (If Applicable).    Thank you for choosing [facility], RN  [phone]  If you need a refill on your cardiac medications before your next appointment, please call your pharmacy.

## 2017-11-23 ENCOUNTER — Encounter: Payer: Self-pay | Admitting: Nurse Practitioner

## 2017-11-23 ENCOUNTER — Ambulatory Visit (INDEPENDENT_AMBULATORY_CARE_PROVIDER_SITE_OTHER): Payer: Medicare Other | Admitting: Nurse Practitioner

## 2017-11-23 VITALS — BP 148/76 | HR 70 | Temp 98.1°F | Ht 63.0 in | Wt 153.0 lb

## 2017-11-23 DIAGNOSIS — Z952 Presence of prosthetic heart valve: Secondary | ICD-10-CM

## 2017-11-23 DIAGNOSIS — Z7901 Long term (current) use of anticoagulants: Secondary | ICD-10-CM | POA: Diagnosis not present

## 2017-11-23 LAB — POCT INR: INR: 2.6 (ref 2.0–3.0)

## 2017-11-23 NOTE — Patient Instructions (Signed)
Follow up with Dr Eulas Post for INR in 3-4 weeks.  Current INR 2.6 on coumadin 4 mg by mouth daily- continue this dose Goal 2.5-3.5

## 2017-11-23 NOTE — Progress Notes (Signed)
Careteam: Patient Care Team: Gildardo Cranker, DO as PCP - General (Internal Medicine)  Advanced Directive information Does Patient Have a Medical Advance Directive?: Yes, Type of Advance Directive: [place];Living will  Allergies  Allergen Reactions  . Carvedilol Itching  . Ace Inhibitors Other (See Comments)    Reaction (??)  . Amlodipine Swelling  . Hydrochlorothiazide Other (See Comments)    Hyponatremia     Chief Complaint  Patient presents with  . Medical Management of Chronic Issues    Pt is being seen for an INR check. Current INR is 2.6. Previous was 2.3     HPI: Patient is a 77 y.o. female seen in the office today INR check. Currently INR 2.6, goal 2.5-3.5. Pt with hx of mitral value replacement, a fib on long term coumadin.  Marland Kitchen No missed doses, no changes in diet, no chest pains or shortness of breath. No falls. No bleeding or bruising.  At last OV INR was 2.3 and coumadin was increased to 4 mg by mouth daily. She was previously taking taking coumadin 4 mg daily except 2 mg on Tuesdays  Reports Dr Radford Pax added ASA at last visit.   Review of Systems:  Review of Systems  Constitutional: Negative for chills, diaphoresis, fever and malaise/fatigue.  HENT: Negative for nosebleeds.   Respiratory: Negative for cough, hemoptysis and shortness of breath.   Cardiovascular: Negative for chest pain and leg swelling.  Gastrointestinal: Negative for blood in stool and melena.  Genitourinary: Negative for dysuria and hematuria.  Musculoskeletal: Negative for falls.  Neurological: Positive for dizziness. Negative for tingling.  Endo/Heme/Allergies: Bruises/bleeds easily.    Past Medical History:  Diagnosis Date  . Broken hip ([place]) 10/2011   left  . Broken wrist   . Depression   . Diarrhea   . H/O long-term (current) use of anticoagulants   . High cholesterol   . Hypertension   . Memory difficulty 01/18/2016  . Mitral valve insufficiency    Had  surgery  . Osteoporosis   . SIADH (syndrome of inappropriate ADH production) ([place])   . Urinary retention    chronic  . Vertigo 01/18/2016   Past Surgical History:  Procedure Laterality Date  . HEMATOMA EVACUATION Left 11/25/2012   Procedure: EVACUATION HEMATOMA;  Surgeon: Linna Hoff, MD;  Location: [facility];  Service: Orthopedics;  Laterality: Left;  . HIP PINNING,CANNULATED Left 11/03/2012   Procedure: CANNULATED HIP PINNING;  Surgeon: Tobi Bastos, MD;  Location: [facility];  Service: Orthopedics;  Laterality: Left;  . IR INJECT/THERA/INC NEEDLE/CATH/PLC EPI/LUMB/SAC W/IMG  07/20/2017  . 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  . OPEN REDUCTION INTERNAL FIXATION (ORIF) WRIST WITH ILIAC CREST BONE GRAFT Left 11/24/2012   Procedure: OPEN REDUCTION INTERNAL FIXATION LEFT WRIST POSSIBLE BONE GRAFTING AND PINNING (ORIF) ;  Surgeon: Linna Hoff, MD;  Location: [facility];  Service: Orthopedics;  Laterality: Left;  . SHOULDER OPEN ROTATOR CUFF REPAIR     Social History:   reports that she has never smoked. She has never used smokeless tobacco. She reports that she does not drink alcohol or use drugs.  Family History  Problem Relation Age of Onset  . Stroke Mother   . Hypertension Mother   . Stroke Father   . Colon cancer Paternal Grandmother   . Parkinsonism Brother     Medications: Patient's Medications  New Prescriptions   No medications on file  Previous Medications   ACETAMINOPHEN (  TYLENOL) 500 MG TABLET    Take 500-1,000 mg by mouth every 6 (six) hours as needed.   ALENDRONATE (FOSAMAX) 70 MG TABLET    Take 1 tablet (70 mg total) by mouth every Sunday. Take with a full glass of water on an empty stomach.   ASPIRIN EC 81 MG TABLET    Take 1 tablet (81 mg total) by mouth daily.   ATORVASTATIN (LIPITOR) 10 MG TABLET    Take 1 tablet (10 mg total) by mouth at bedtime.   CHOLECALCIFEROL (VITAMIN D3) 1000 UNITS CAPS    Take 1 capsule (1,000 Units total) by mouth daily.   LOSARTAN  (COZAAR) 100 MG TABLET    Take 1 tablet (100 mg total) by mouth daily.   METOPROLOL SUCCINATE (TOPROL-XL) 50 MG 24 HR TABLET    Take 1 tablet (50 mg total) by mouth daily.   OMEGA-3 FATTY ACIDS (OMEGA-3 FISH OIL) 1200 MG CAPS    Take 2 capsules (2,400 mg total) by mouth 2 (two) times daily.   TIZANIDINE (ZANAFLEX) 4 MG TABLET    TAKE ONE TABLET BY MOUTH THREE TIMES A DAY AS NEEDED FOR MUSCLES SPASMS  --MAY CUT TABLET IN HALF   TRAMADOL (ULTRAM) 50 MG TABLET    Take 1 tablet (50 mg total) by mouth every 8 (eight) hours as needed.   TRAZODONE (DESYREL) 50 MG TABLET    Take 0.5-1 tablets (25-50 mg total) by mouth at bedtime as needed for sleep (1/2 to 1 tablet PRN).   WARFARIN (COUMADIN) 4 MG TABLET    Take 1 tablet (4 mg total) by mouth daily.  Modified Medications   No medications on file  Discontinued Medications   MECLIZINE (ANTIVERT) 25 MG TABLET    Take 1 tablet (25 mg total) by mouth daily as needed for dizziness.     Physical Exam:  Vitals:   11/23/17 1420  BP: (!) 148/76  Pulse: 70  Temp: 98.1 F (36.7 C)  TempSrc: Oral  SpO2: 96%  Weight: 153 lb (69.4 kg)  Height: 5\' 3"  (1.6 m)   Body mass index is 27.1 kg/m.  Physical Exam  Constitutional: She appears well-developed and well-nourished.  HENT:  MMM; no oral thrush  Eyes: Pupils are equal, round, and reactive to light.  Neck: Neck supple. Carotid bruit is not present. No tracheal deviation present.  Cardiovascular: Normal rate and intact distal pulses. An irregularly irregular rhythm present. Exam reveals no gallop and no friction rub.  Murmur heard.  Systolic murmur is present with a grade of 1/6. Pulmonary/Chest: Effort normal and breath sounds normal.  Abdominal: Normal appearance. There is no hepatomegaly. There is no rigidity.  Musculoskeletal: She exhibits no edema.  Neurological: She is alert. She has normal reflexes.  Skin: Skin is warm and dry. No rash noted.  Psychiatric: She has a normal mood and affect.  Her behavior is normal. Thought content normal.    Labs reviewed: Basic Metabolic Panel: Recent Labs    02/27/17  07/17/17 0138  07/21/17 0528 07/22/17 0520 10/02/17 1000  NA 130*   < > 130*   < > 131* 130* 136  K 4.8   < > 4.4   < > 4.2 4.6 4.2  CL  --    < > 98*   < > 99* 98* 102  CO2  --    < > 23   < > 23 24 27   GLUCOSE  --    < > 110*   < >  119* 112* 105*  BUN 12   < > 15   < > 18 16 12   CREATININE 0.7   < > 0.59   < > 0.59 0.60 0.70  CALCIUM  --    < > 8.2*   < > 9.3 9.1 9.7  MG  --   --  2.1  --  1.9  --   --   TSH 0.62  --   --   --   --   --  0.72   < > = values in this interval not displayed.   Liver Function Tests: Recent Labs    07/18/17 0014 07/21/17 0528 07/22/17 0520 10/02/17 1000  AST 30 18 25 15   ALT 43 24 31 12   ALKPHOS 62 58 55  --   BILITOT 3.9* 2.4* 1.9* 0.9  PROT 6.4* 6.3* 6.1* 6.8  ALBUMIN 3.7 3.4* 3.3*  --    No results for input(s): LIPASE, AMYLASE in the last 8760 hours. Recent Labs    07/16/17 0519  AMMONIA 31   CBC: Recent Labs    07/16/17 0520  07/21/17 0528 07/22/17 0520 10/02/17 1000  WBC 21.3*   < > 27.1* 21.2* 8.7  NEUTROABS 16.6*  --  23.0*  --  6,012  HGB 8.4*   < > 10.4* 10.3* 13.5  HCT 24.9*   < > 31.2* 31.1* 41.7  MCV 85.6   < > 90.2 90.9 88.5  PLT 324   < > 332 332 353   < > = values in this interval not displayed.   Lipid Panel: Recent Labs    02/27/17 10/02/17 1000  CHOL 192 185  HDL 84* 71  LDLCALC 96 101*  TRIG 62 50  CHOLHDL  --  2.6   TSH: Recent Labs    02/27/17 10/02/17 1000  TSH 0.62 0.72   A1C: No results found for: HGBA1C   Assessment/Plan 1. Long term (current) use of anticoagulants - POC INR 2.6, goal 2.5-3.5 Will continue current coumadin 4 mg by mouth daily dose  2. H/O mitral valve replacement with mechanical valve Stable, continues on coumadin for anticoagulation.   Next appt: 3-4 weeks with Dr Eulas Post for INR check [facility]. [place], [facility] [phone]

## 2017-12-20 ENCOUNTER — Other Ambulatory Visit: Payer: Self-pay | Admitting: Internal Medicine

## 2017-12-20 DIAGNOSIS — Z952 Presence of prosthetic heart valve: Secondary | ICD-10-CM

## 2017-12-20 DIAGNOSIS — Z7901 Long term (current) use of anticoagulants: Secondary | ICD-10-CM

## 2017-12-22 ENCOUNTER — Ambulatory Visit (INDEPENDENT_AMBULATORY_CARE_PROVIDER_SITE_OTHER): Payer: Medicare Other | Admitting: Internal Medicine

## 2017-12-22 ENCOUNTER — Encounter: Payer: Self-pay | Admitting: Internal Medicine

## 2017-12-22 VITALS — BP 142/84 | HR 72 | Temp 97.4°F | Resp 20 | Ht 63.0 in | Wt 158.8 lb

## 2017-12-22 DIAGNOSIS — Z7901 Long term (current) use of anticoagulants: Secondary | ICD-10-CM

## 2017-12-22 DIAGNOSIS — H6123 Impacted cerumen, bilateral: Secondary | ICD-10-CM

## 2017-12-22 DIAGNOSIS — R42 Dizziness and giddiness: Secondary | ICD-10-CM

## 2017-12-22 LAB — POCT INR: INR: 2.6 (ref 2.0–3.0)

## 2017-12-22 NOTE — Progress Notes (Signed)
Patient ID: Jodi Frank, female   DOB: Jul 12, 1940, 77 y.o.   MRN: [id number]   Location:  [facility] OFFICE  Provider: DR Arletha Grippe  Code Status: Goals of Care:  Advanced Directives 11/23/2017  Does Patient Have a Medical Advance Directive? Yes  Type of Paramedic of [place];Living will  Does patient want to make changes to medical advance directive? -  Copy of [employer] in Chart? Yes  Would patient like information on creating a medical advance directive? -  Pre-existing out of facility DNR order (yellow form or pink MOST form) -     Chief Complaint  Patient presents with  . Medication Management    INR check  . Acute Visit    feeling dizzy x 1 month    HPI: Patient is a 77 y.o. female seen today for medical management of long term anticoagulation use due to afib and MVR. INR 2.6 today with GOAL INR 2.5-3.5. She eats 1 salad per day. No missed coumadin doses. No bleeding. No worsening bruises.  She is c/a dizziness upon standing x 1 month. No associated room spinning. No HA, sinus/ear pressure/pain, visual changes. She is using a cane to steady herself. She does use a Q tip to clean her ears. No hearing loss. She has a hx dizziness. No relief with meclizine.  She is a poor historian due to cognitive impairment. Hx obtained from chart.  Past Medical History:  Diagnosis Date  . Broken hip ([place]) 10/2011   left  . Broken wrist   . Depression   . Diarrhea   . H/O long-term (current) use of anticoagulants   . High cholesterol   . Hypertension   . Memory difficulty 01/18/2016  . 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 Osteoporosis   . SIADH (syndrome of inappropriate ADH production) ([place])   . Urinary retention    chronic  . Vertigo 01/18/2016    Past Surgical History:  Procedure Laterality Date  . HEMATOMA EVACUATION Left 11/25/2012   Procedure: EVACUATION HEMATOMA;  Surgeon: Linna Hoff, MD;  Location: [facility];  Service:  Orthopedics;  Laterality: Left;  . HIP PINNING,CANNULATED Left 11/03/2012   Procedure: CANNULATED HIP PINNING;  Surgeon: Tobi Bastos, MD;  Location[facility];  Service: Orthopedics;  Laterality: Left;  . IR INJECT/THERA/INC NEEDLE/CATH/PLC EPI/LUMB/SAC W/IMG  07/20/2017  . 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  . OPEN REDUCTION INTERNAL FIXATION (ORIF) WRIST WITH ILIAC CREST BONE GRAFT Left 11/24/2012   Procedure: OPEN REDUCTION INTERNAL FIXATION LEFT WRIST POSSIBLE BONE GRAFTING AND PINNING (ORIF) ;  Surgeon: Linna Hoff, MD;  Location: [facility];  Service: Orthopedics;  Laterality: Left;  . SHOULDER OPEN ROTATOR CUFF REPAIR       reports that she has never smoked. She has never used smokeless tobacco. She reports that she does not drink alcohol or use drugs. Social History   Socioeconomic History  . Marital status: Divorced    Spouse name: Not on file  . Number of children: 1  . Years of education: BS  . Highest education level: Not on file  Occupational History  . Occupation: Retired  Scientific laboratory technician  . Financial resource strain: Not hard at all  . Food insecurity:    Worry: Never true    Inability: Never true  . Transportation needs:    Medical: No    Non-medical: No  Tobacco Use  . Smoking  status: Never Smoker  . Smokeless tobacco: Never Used  Substance and Sexual Activity  . Alcohol use: No    Frequency: Never  . Drug use: No  . Sexual activity: Not Currently  Lifestyle  . Physical activity:    Days per week: 0 days    Minutes per session: 0 min  . Stress: Not at all  Relationships  . Social connections:    Talks on phone: Twice a week    Gets together: Twice a week    Attends religious service: Never    Active member of club or organization: No    Attends meetings of clubs or organizations: Never    Relationship status: Divorced  . Intimate partner violence:    Fear of current or ex partner: No    Emotionally abused: No    Physically abused: No     Forced sexual activity: No  Other Topics Concern  . Not on file  Social History Narrative   Lives at home, alone   Right-handed   Caffeine: 2 cups per day    Family History  Problem Relation Age of Onset  . Stroke Mother   . Hypertension Mother   . Stroke Father   . Colon cancer Paternal Grandmother   . Parkinsonism Brother     Allergies  Allergen Reactions  . Carvedilol Itching  . Ace Inhibitors Other (See Comments)    Reaction (??)  . Amlodipine Swelling  . Hydrochlorothiazide Other (See Comments)    Hyponatremia     Outpatient Encounter Medications as of 12/22/2017  Medication Sig  . acetaminophen (TYLENOL) 500 MG tablet Take 500-1,000 mg by mouth every 6 (six) hours as needed.  Marland Kitchen alendronate (FOSAMAX) 70 MG tablet Take 1 tablet (70 mg total) by mouth every Sunday. Take with a full glass of water on an empty stomach.  Marland Kitchen aspirin EC 81 MG tablet Take 1 tablet (81 mg total) by mouth daily.  Marland Kitchen atorvastatin (LIPITOR) 10 MG tablet Take 1 tablet (10 mg total) by mouth at bedtime.  . Cholecalciferol (VITAMIN D3) 1000 units CAPS Take 1 capsule (1,000 Units total) by mouth daily.  Marland Kitchen losartan (COZAAR) 100 MG tablet Take 1 tablet (100 mg total) by mouth daily.  . metoprolol succinate (TOPROL-XL) 50 MG 24 hr tablet Take 1 tablet (50 mg total) by mouth daily.  . Omega-3 Fatty Acids (OMEGA-3 FISH OIL) 1200 MG CAPS Take 2 capsules (2,400 mg total) by mouth 2 (two) times daily. (Patient taking differently: Take 1,200 mg by mouth 2 (two) times daily. )  . tiZANidine (ZANAFLEX) 4 MG tablet TAKE ONE TABLET BY MOUTH THREE TIMES A DAY AS NEEDED FOR MUSCLES SPASMS  --MAY CUT TABLET IN HALF  . traZODone (DESYREL) 50 MG tablet Take 0.5-1 tablets (25-50 mg total) by mouth at bedtime as needed for sleep (1/2 to 1 tablet PRN).  Marland Kitchen warfarin (COUMADIN) 4 MG tablet TAKE ONE TABLET BY MOUTH DAILY EXCEPT TAKE 1/2 TABLET ON TUESDAYS (Patient taking differently: TAKE ONE TABLET BY MOUTH DAILY)  . traMADol  (ULTRAM) 50 MG tablet Take 1 tablet (50 mg total) by mouth every 8 (eight) hours as needed. (Patient not taking: Reported on 12/22/2017)   No facility-administered encounter medications on file as of 12/22/2017.     Review of Systems:  Review of Systems  HENT: Negative for ear discharge, ear pain, hearing loss, postnasal drip and sinus pressure.   Neurological: Positive for dizziness.  All other systems reviewed and are negative.  Health Maintenance  Topic Date Due  . TETANUS/TDAP  07/10/2015  . COLONOSCOPY  09/20/2015  . INFLUENZA VACCINE  12/24/2017  . DEXA SCAN  Completed  . PNA vac Low Risk Adult  Completed    Physical Exam: Vitals:   12/22/17 1106  BP: (!) 142/84  Pulse: 72  Resp: 20  Temp: (!) 97.4 F (36.3 C)  TempSrc: Oral  SpO2: 95%  Weight: 158 lb 12.8 oz (72 kg)  Height: 5\' 3"  (1.6 m)   Body mass index is 28.13 kg/m. Physical Exam  Constitutional: She appears well-developed and well-nourished.  HENT:  Right Ear: No drainage.  Left Ear: No drainage.  Ears:  Neurological: She is alert.  Skin: Skin is warm and dry. No rash noted.  Psychiatric: She has a normal mood and affect. Her behavior is normal. Thought content normal.    Labs reviewed: Basic Metabolic Panel: Recent Labs    02/27/17  07/17/17 0138  07/21/17 0528 07/22/17 0520 10/02/17 1000  NA 130*   < > 130*   < > 131* 130* 136  K 4.8   < > 4.4   < > 4.2 4.6 4.2  CL  --    < > 98*   < > 99* 98* 102  CO2  --    < > 23   < > 23 24 27   GLUCOSE  --    < > 110*   < > 119* 112* 105*  BUN 12   < > 15   < > 18 16 12   CREATININE 0.7   < > 0.59   < > 0.59 0.60 0.70  CALCIUM  --    < > 8.2*   < > 9.3 9.1 9.7  MG  --   --  2.1  --  1.9  --   --   TSH 0.62  --   --   --   --   --  0.72   < > = values in this interval not displayed.   Liver Function Tests: Recent Labs    07/18/17 0014 07/21/17 0528 07/22/17 0520 10/02/17 1000  AST 30 18 25 15   ALT 43 24 31 12   ALKPHOS 62 58 55  --   BILITOT  3.9* 2.4* 1.9* 0.9  PROT 6.4* 6.3* 6.1* 6.8  ALBUMIN 3.7 3.4* 3.3*  --    No results for input(s): LIPASE, AMYLASE in the last 8760 hours. Recent Labs    07/16/17 0519  AMMONIA 31   CBC: Recent Labs    07/16/17 0520  07/21/17 0528 07/22/17 0520 10/02/17 1000  WBC 21.3*   < > 27.1* 21.2* 8.7  NEUTROABS 16.6*  --  23.0*  --  6,012  HGB 8.4*   < > 10.4* 10.3* 13.5  HCT 24.9*   < > 31.2* 31.1* 41.7  MCV 85.6   < > 90.2 90.9 88.5  PLT 324   < > 332 332 353   < > = values in this interval not displayed.   Lipid Panel: Recent Labs    02/27/17 10/02/17 1000  CHOL 192 185  HDL 84* 71  LDLCALC 96 101*  TRIG 62 50  CHOLHDL  --  2.6   No results found for: HGBA1C  Procedures since last visit: No results found.  Assessment/Plan   ICD-10-CM   1. Bilateral impacted cerumen H61.23   2. Long term (current) use of anticoagulants Z79.01 POC INR  3. Dizzinesses R42    AVOID USING  Q TIPS AND OTHER OBJECTS TO CLEAN YOUR EARS  Ear lavage performed today  INR 2.6 TODAY (GOAL 2.5 - 3.5). CONTINUE COUMADIN 4MG  EVERY EVENING  Follow up in 4 weeks for INR mx   Jodi Frank  [facility] [address] [place], [place] [address] [phone] Cell (Monday-Friday 8 AM - 5 PM) [phone] After 5 PM and follow prompts

## 2017-12-22 NOTE — Patient Instructions (Addendum)
AVOID USING Q TIPS AND OTHER OBJECTS TO CLEAN YOUR EARS  Ear lavage performed today  INR 2.6 TODAY (GOAL 2.5 - 3.5). CONTINUE COUMADIN 4MG  EVERY EVENING  Follow up in 4 weeks for INR mx   Earwax Buildup, Adult The ears produce a substance called earwax that helps keep bacteria out of the ear and protects the skin in the ear canal. Occasionally, earwax can build up in the ear and cause discomfort or hearing loss. What increases the risk? This condition is more likely to develop in people who:  Are female.  Are elderly.  Naturally produce more earwax.  Clean their ears often with cotton swabs.  Use earplugs often.  Use in-ear headphones often.  Wear hearing aids.  Have narrow ear canals.  Have earwax that is overly thick or sticky.  Have eczema.  Are dehydrated.  Have excess hair in the ear canal.  What are the signs or symptoms? Symptoms of this condition include:  Reduced or muffled hearing.  A feeling of fullness in the ear or feeling that the ear is plugged.  Fluid coming from the ear.  Ear pain.  Ear itch.  Ringing in the ear.  Coughing.  An obvious piece of earwax that can be seen inside the ear canal.  How is this diagnosed? This condition may be diagnosed based on:  Your symptoms.  Your medical history.  An ear exam. During the exam, your health care provider will look into your ear with an instrument called an otoscope.  You may have tests, including a hearing test. How is this treated? This condition may be treated by:  Using ear drops to soften the earwax.  Having the earwax removed by a health care provider. The health care provider may: ? Flush the ear with water. ? Use an instrument that has a loop on the end (curette). ? Use a suction device.  Surgery to remove the wax buildup. This may be done in severe cases.  Follow these instructions at home:  Take over-the-counter and prescription medicines only as told by your health  care provider.  Do not put any objects, including cotton swabs, into your ear. You can clean the opening of your ear canal with a washcloth or facial tissue.  Follow instructions from your health care provider about cleaning your ears. Do not over-clean your ears.  Drink enough fluid to keep your urine clear or pale yellow. This will help to thin the earwax.  Keep all follow-up visits as told by your health care provider. If earwax builds up in your ears often or if you use hearing aids, consider seeing your health care provider for routine, preventive ear cleanings. Ask your health care provider how often you should schedule your cleanings.  If you have hearing aids, clean them according to instructions from the manufacturer and your health care provider. Contact a health care provider if:  You have ear pain.  You develop a fever.  You have blood, pus, or other fluid coming from your ear.  You have hearing loss.  You have ringing in your ears that does not go away.  Your symptoms do not improve with treatment.  You feel like the room is spinning (vertigo). Summary  Earwax can build up in the ear and cause discomfort or hearing loss.  The most common symptoms of this condition include reduced or muffled hearing and a feeling of fullness in the ear or feeling that the ear is plugged.  This condition may  be diagnosed based on your symptoms, your medical history, and an ear exam.  This condition may be treated by using ear drops to soften the earwax or by having the earwax removed by a health care provider.  Do not put any objects, including cotton swabs, into your ear. You can clean the opening of your ear canal with a washcloth or facial tissue. This information is not intended to replace advice given to you by your health care provider. Make sure you discuss any questions you have with your health care provider. Document Released: 06/19/2004 Document Revised: 07/23/2016 Document  Reviewed: 07/23/2016 Elsevier Interactive Patient Education  2018 [employer].  Dizziness Dizziness is a common problem. It makes you feel unsteady or light-headed. You may feel like you are about to pass out (faint). Dizziness can lead to getting hurt if you stumble or fall. Dizziness can be caused by many things, including:  Medicines.  Not having enough water in your body (dehydration).  Illness.  Follow these instructions at home: Eating and drinking  Drink enough fluid to keep your pee (urine) clear or pale yellow. This helps to keep you from getting dehydrated. Try to drink more clear fluids, such as water.  Do not drink alcohol.  Limit how much caffeine you drink or eat, if your doctor tells you to do that.  Limit how much salt (sodium) you drink or eat, if your doctor tells you to do that. Activity  Avoid making quick movements. ? When you stand up from sitting in a chair, steady yourself until you feel okay. ? In the morning, first sit up on the side of the bed. When you feel okay, stand slowly while you hold onto something. Do this until you know that your balance is fine.  If you need to stand in one place for a long time, move your legs often. Tighten and relax the muscles in your legs while you are standing.  Do not drive or use heavy machinery if you feel dizzy.  Avoid bending down if you feel dizzy. Place items in your home so you can reach them easily without leaning over. Lifestyle  Do not use any products that contain nicotine or tobacco, such as cigarettes and e-cigarettes. If you need help quitting, ask your doctor.  Try to lower your stress level. You can do this by using methods such as yoga or meditation. Talk with your doctor if you need help. General instructions  Watch your dizziness for any changes.  Take over-the-counter and prescription medicines only as told by your doctor. Talk with your doctor if you think that you are dizzy because of a  medicine that you are taking.  Tell a friend or a family member that you are feeling dizzy. If he or she notices any changes in your behavior, have this person call your doctor.  Keep all follow-up visits as told by your doctor. This is important. Contact a doctor if:  Your dizziness does not go away.  Your dizziness or light-headedness gets worse.  You feel sick to your stomach (nauseous).  You have trouble hearing.  You have new symptoms.  You are unsteady on your feet.  You feel like the room is spinning. Get help right away if:  You throw up (vomit) or have watery poop (diarrhea), and you cannot eat or drink anything.  You have trouble: ? Talking. ? Walking. ? Swallowing. ? Using your arms, hands, or legs.  You feel generally weak.  You are  not thinking clearly, or you have trouble forming sentences. A friend or family member may notice this.  You have: ? Chest pain. ? Pain in your belly (abdomen). ? Shortness of breath. ? Sweating.  Your vision changes.  You are bleeding.  You have a very bad headache.  You have neck pain or a stiff neck.  You have a fever. These symptoms may be an emergency. Do not wait to see if the symptoms will go away. Get medical help right away. Call your local emergency services (911 in the [place] Do not drive yourself to the hospital. Summary  Dizziness makes you feel unsteady or light-headed. You may feel like you are about to pass out (faint).  Drink enough fluid to keep your pee (urine) clear or pale yellow. Do not drink alcohol.  Avoid making quick movements if you feel dizzy.  Watch your dizziness for any changes. This information is not intended to replace advice given to you by your health care provider. Make sure you discuss any questions you have with your health care provider. Document Released: 05/01/2011 Document Revised: 05/29/2016 Document Reviewed: 05/29/2016 Elsevier Interactive Patient Education  2017  [employer].

## 2017-12-27 NOTE — Progress Notes (Signed)
This encounter was created in error - please disregard.

## 2018-01-13 ENCOUNTER — Encounter: Payer: Self-pay | Admitting: Internal Medicine

## 2018-01-15 ENCOUNTER — Encounter: Payer: Self-pay | Admitting: Internal Medicine

## 2018-01-15 ENCOUNTER — Ambulatory Visit (INDEPENDENT_AMBULATORY_CARE_PROVIDER_SITE_OTHER): Payer: Medicare Other | Admitting: Internal Medicine

## 2018-01-15 VITALS — BP 140/80 | HR 78 | Temp 98.1°F | Ht 63.0 in | Wt 162.0 lb

## 2018-01-15 DIAGNOSIS — R42 Dizziness and giddiness: Secondary | ICD-10-CM

## 2018-01-15 DIAGNOSIS — M810 Age-related osteoporosis without current pathological fracture: Secondary | ICD-10-CM

## 2018-01-15 DIAGNOSIS — I480 Paroxysmal atrial fibrillation: Secondary | ICD-10-CM

## 2018-01-15 DIAGNOSIS — Z7901 Long term (current) use of anticoagulants: Secondary | ICD-10-CM | POA: Diagnosis not present

## 2018-01-15 DIAGNOSIS — Z952 Presence of prosthetic heart valve: Secondary | ICD-10-CM | POA: Diagnosis not present

## 2018-01-15 DIAGNOSIS — I10 Essential (primary) hypertension: Secondary | ICD-10-CM | POA: Diagnosis not present

## 2018-01-15 LAB — POCT INR: INR: 2.6 (ref 2.0–3.0)

## 2018-01-15 MED ORDER — ALENDRONATE SODIUM 70 MG PO TABS: 70.0000 mg | ORAL_TABLET | ORAL | 1 refills | Status: DC

## 2018-01-15 NOTE — Patient Instructions (Addendum)
Continue current medications as ordered  Follow up with specialists as scheduled  Monitor blood pressure at home 2 times daily - call office if readings > 160/80 persistently  INR 2.6 TODAY - CONTINUE COUMADIN 4MG  EVERY EVENING  PLEASE MAKE 4 WEEK COUMADIN APPT  Follow up in 3 mos with Janett Billow for HTN, dizziness, cognitive impairment, PAF/s/p MVR

## 2018-01-15 NOTE — Progress Notes (Signed)
Patient ID: Jodi Frank, female   DOB: [birth year], 76 y.o.   MRN: [id number]   Location:  [facility] OFFICE  Provider: DR Arletha Grippe  Code Status: Goals of Care:  Advanced Directives 11/23/2017  Does Patient Have a Medical Advance Directive? Yes  Type of Paramedic of [place];Living will  Does patient want to make changes to medical advance directive? -  Copy of [place] in Chart? Yes  Would patient like information on creating a medical advance directive? -  Pre-existing out of facility DNR order (yellow form or pink MOST form) -     Chief Complaint  Patient presents with  . Medical Management of Chronic Issues    memory loss    HPI: Patient is a 77 y.o. female seen today for medical management of chronic diseases.  No missed coumadin doses. No bleeding.   Dizziness - no recent issues. intermittent throughout the day but worse after taking metoprolol 50mg  daily. Meclizine helps.   Fall hx - she falls at least 1 time per yr. She drinks several glasses of wine at night which contributes to falls.  HTN - takes metoprolol XL; she stopped her losartan on her own  Cognitive impairment -unchanged. It has worsened in last 2 yrs; associated combativeness. Weight is stable. Albumin 4.5  Depression/insomnia - mood stable on trazodone. 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 Takes coumadin. INR GOAL 2.5-3.5 due to mechanical valve. Rate controlled with metoprolol. 2D echo revealed EF 65-70% in Dec 2018. INR 2.6 on  12/22/17. She is followed by coumadin clinic at [facility]. Followed by cardio Dr Radford Pax. Plan to get 2D echo in Dec 2019. She was started on ASA daily  Hx anemia - stable. Hgb 13.5  Osteoporosis - stable on fosamax. Last DXA 01/2016 revealed T score -2.9.  Hyperlipidemia - stable on lipitor and omega 3 fish oil. LDL 101; HDL 71  Hx abdominal  atherosclerosis - on coumadin. INR at goal  HM - Her last Td 06/2005; Zostavax 07/2013    Past Medical History:  Diagnosis Date  . Broken hip ([place]) 10/2011   left  . Broken wrist   . Depression   . Diarrhea   . H/O long-term (current) use of anticoagulants   . High cholesterol   . Hypertension   . Memory difficulty 01/18/2016  . 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 Osteoporosis   . SIADH (syndrome of inappropriate ADH production) ([place])   . Urinary retention    chronic  . Vertigo 01/18/2016    Past Surgical History:  Procedure Laterality Date  . HEMATOMA EVACUATION Left 11/25/2012   Procedure: EVACUATION HEMATOMA;  Surgeon: Linna Hoff, MD;  Location: [facility];  Service: Orthopedics;  Laterality: Left;  . HIP PINNING,CANNULATED Left 11/03/2012   Procedure: CANNULATED HIP PINNING;  Surgeon: Tobi Bastos, MD;  Location: [facility];  Service: Orthopedics;  Laterality: Left;  . IR INJECT/THERA/INC NEEDLE/CATH/PLC EPI/LUMB/SAC W/IMG  07/20/2017  . 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  . OPEN REDUCTION INTERNAL FIXATION (ORIF) WRIST WITH ILIAC CREST BONE GRAFT Left 11/24/2012   Procedure: OPEN REDUCTION INTERNAL FIXATION LEFT WRIST POSSIBLE BONE GRAFTING AND PINNING (ORIF) ;  Surgeon: Linna Hoff, MD;  Location: [facility];  Service: Orthopedics;  Laterality: Left;  . SHOULDER OPEN ROTATOR CUFF REPAIR  reports that she has never smoked. She has never used smokeless tobacco. She reports that she does not drink alcohol or use drugs. Social History   Socioeconomic History  . Marital status: Divorced    Spouse name: Not on file  . Number of children: 1  . Years of education: BS  . Highest education level: Not on file  Occupational History  . Occupation: Retired  Scientific laboratory technician  . Financial resource strain: Not hard at all  . Food insecurity:    Worry: Never true    Inability: Never true  . Transportation needs:    Medical: No    Non-medical: No  Tobacco Use    . Smoking status: Never Smoker  . Smokeless tobacco: Never Used  Substance and Sexual Activity  . Alcohol use: No    Frequency: Never  . Drug use: No  . Sexual activity: Not Currently  Lifestyle  . Physical activity:    Days per week: 0 days    Minutes per session: 0 min  . Stress: Not at all  Relationships  . Social connections:    Talks on phone: Twice a week    Gets together: Twice a week    Attends religious service: Never    Active member of club or organization: No    Attends meetings of clubs or organizations: Never    Relationship status: Divorced  . Intimate partner violence:    Fear of current or ex partner: No    Emotionally abused: No    Physically abused: No    Forced sexual activity: No  Other Topics Concern  . Not on file  Social History Narrative   Lives at home, alone   Right-handed   Caffeine: 2 cups per day    Family History  Problem Relation Age of Onset  . Stroke Mother   . Hypertension Mother   . Stroke Father   . Colon cancer Paternal Grandmother   . Parkinsonism Brother     Allergies  Allergen Reactions  . Carvedilol Itching  . Ace Inhibitors Other (See Comments)    Reaction (??)  . Amlodipine Swelling  . Hydrochlorothiazide Other (See Comments)    Hyponatremia     Outpatient Encounter Medications as of 01/15/2018  Medication Sig  . alendronate (FOSAMAX) 70 MG tablet Take 1 tablet (70 mg total) by mouth every Sunday. Take with a full glass of water on an empty stomach.  Marland Kitchen aspirin EC 81 MG tablet Take 1 tablet (81 mg total) by mouth daily.  Marland Kitchen atorvastatin (LIPITOR) 10 MG tablet Take 1 tablet (10 mg total) by mouth at bedtime.  Marland Kitchen losartan (COZAAR) 100 MG tablet Take 1 tablet (100 mg total) by mouth daily.  . metoprolol succinate (TOPROL-XL) 50 MG 24 hr tablet Take 1 tablet (50 mg total) by mouth daily.  . Omega-3 Fatty Acids (OMEGA-3 FISH OIL) 1200 MG CAPS Take 2 capsules (2,400 mg total) by mouth 2 (two) times daily.  Marland Kitchen warfarin  (COUMADIN) 4 MG tablet Take as prescribed  . [DISCONTINUED] acetaminophen (TYLENOL) 500 MG tablet Take 500-1,000 mg by mouth every 6 (six) hours as needed.  . [DISCONTINUED] Cholecalciferol (VITAMIN D3) 1000 units CAPS Take 1 capsule (1,000 Units total) by mouth daily.  . [DISCONTINUED] tiZANidine (ZANAFLEX) 4 MG tablet TAKE ONE TABLET BY MOUTH THREE TIMES A DAY AS NEEDED FOR MUSCLES SPASMS  --MAY CUT TABLET IN HALF  . [DISCONTINUED] traMADol (ULTRAM) 50 MG tablet Take 1 tablet (50 mg total) by mouth  every 8 (eight) hours as needed. (Patient not taking: Reported on 12/22/2017)  . [DISCONTINUED] traZODone (DESYREL) 50 MG tablet Take 0.5-1 tablets (25-50 mg total) by mouth at bedtime as needed for sleep (1/2 to 1 tablet PRN).  . [DISCONTINUED] warfarin (COUMADIN) 4 MG tablet TAKE ONE TABLET BY MOUTH DAILY EXCEPT TAKE 1/2 TABLET ON TUESDAYS   No facility-administered encounter medications on file as of 01/15/2018.     Review of Systems:  Review of Systems  Unable to perform ROS: Other (memory loss)    Health Maintenance  Topic Date Due  . TETANUS/TDAP  07/10/2015  . COLONOSCOPY  09/20/2015  . INFLUENZA VACCINE  12/24/2017  . DEXA SCAN  Completed  . PNA vac Low Risk Adult  Completed    Physical Exam: Vitals:   01/15/18 1157  BP: 140/80  Pulse: 78  Temp: 98.1 F (36.7 C)  TempSrc: Oral  SpO2: 97%  Weight: 162 lb (73.5 kg)  Height: 5\' 3"  (1.6 m)   Body mass index is 28.7 kg/m. Physical Exam  Constitutional: She is oriented to person, place, and time. She appears well-developed and well-nourished.  HENT:  Mouth/Throat: Oropharynx is clear and moist. No oropharyngeal exudate.  MMM; no oral thrush  Eyes: Pupils are equal, round, and reactive to light. No scleral icterus.  Neck: Neck supple. Carotid bruit is not present. No tracheal deviation present. No thyromegaly present.  Cardiovascular: Normal rate and intact distal pulses. An irregularly irregular rhythm present. Exam reveals  no gallop and no friction rub.  Murmur heard.  Systolic murmur is present with a grade of 1/6. No LE edema b/l. no calf TTP.   Pulmonary/Chest: Effort normal and breath sounds normal. No stridor. No respiratory distress. She has no wheezes. She has no rales.  Abdominal: Soft. Normal appearance and bowel sounds are normal. She exhibits no distension and no mass. There is no hepatomegaly. There is no tenderness. There is no rigidity, no rebound and no guarding. No hernia.  obese  Musculoskeletal: She exhibits edema (small and large joints).  Lymphadenopathy:    She has no cervical adenopathy.  Neurological: She is alert and oriented to person, place, and time. She has normal reflexes.  Skin: Skin is warm and dry. No rash noted.  Psychiatric: She has a normal mood and affect. Her behavior is normal. Thought content normal.    Labs reviewed: Basic Metabolic Panel: Recent Labs    02/27/17  07/17/17 0138  07/21/17 0528 07/22/17 0520 10/02/17 1000  NA 130*   < > 130*   < > 131* 130* 136  K 4.8   < > 4.4   < > 4.2 4.6 4.2  CL  --    < > 98*   < > 99* 98* 102  CO2  --    < > 23   < > 23 24 27   GLUCOSE  --    < > 110*   < > 119* 112* 105*  BUN 12   < > 15   < > 18 16 12   CREATININE 0.7   < > 0.59   < > 0.59 0.60 0.70  CALCIUM  --    < > 8.2*   < > 9.3 9.1 9.7  MG  --   --  2.1  --  1.9  --   --   TSH 0.62  --   --   --   --   --  0.72   < > = values in  this interval not displayed.   Liver Function Tests: Recent Labs    07/18/17 0014 07/21/17 0528 07/22/17 0520 10/02/17 1000  AST 30 18 25 15   ALT 43 24 31 12   ALKPHOS 62 58 55  --   BILITOT 3.9* 2.4* 1.9* 0.9  PROT 6.4* 6.3* 6.1* 6.8  ALBUMIN 3.7 3.4* 3.3*  --    No results for input(s): LIPASE, AMYLASE in the last 8760 hours. Recent Labs    07/16/17 0519  AMMONIA 31   CBC: Recent Labs    07/16/17 0520  07/21/17 0528 07/22/17 0520 10/02/17 1000  WBC 21.3*   < > 27.1* 21.2* 8.7  NEUTROABS 16.6*  --  23.0*  --  6,012    HGB 8.4*   < > 10.4* 10.3* 13.5  HCT 24.9*   < > 31.2* 31.1* 41.7  MCV 85.6   < > 90.2 90.9 88.5  PLT 324   < > 332 332 353   < > = values in this interval not displayed.   Lipid Panel: Recent Labs    02/27/17 10/02/17 1000  CHOL 192 185  HDL 84* 71  LDLCALC 96 101*  TRIG 62 50  CHOLHDL  --  2.6   No results found for: HGBA1C  Procedures since last visit: No results found.  Assessment/Plan   ICD-10-CM   1. Long term (current) use of anticoagulants Z79.01 POC INR  2. H/O mitral valve replacement with mechanical valve Z95.2   3. Essential hypertension, benign I10   4. Paroxysmal atrial fibrillation (HCC) I48.0   5. Age related osteoporosis, unspecified pathological fracture presence M81.0 alendronate (FOSAMAX) 70 MG tablet  6. Dizzinesses R42      Continue current medications as ordered  Follow up with specialists as scheduled  Monitor blood pressure at home 2 times daily - call office if readings > 160/80 persistently  INR 2.6 TODAY - CONTINUE COUMADIN 4MG  EVERY EVENING  PLEASE MAKE 4 WEEK COUMADIN APPT  Follow up in 3 mos with Janett Billow for HTN, dizziness, cognitive impairment, PAF/s/p MVR - will need fasting lab  [place]. Perlie Gold  [facility] [address] [place], [place] [address] [phone] Cell (Monday-Friday 8 AM - 5 PM) [phone] After 5 PM and follow prompts

## 2018-01-20 ENCOUNTER — Ambulatory Visit: Payer: Medicare Other | Admitting: Internal Medicine

## 2018-02-10 ENCOUNTER — Ambulatory Visit (INDEPENDENT_AMBULATORY_CARE_PROVIDER_SITE_OTHER): Payer: Medicare Other | Admitting: Internal Medicine

## 2018-02-10 ENCOUNTER — Other Ambulatory Visit: Payer: Self-pay

## 2018-02-10 ENCOUNTER — Encounter: Payer: Self-pay | Admitting: Internal Medicine

## 2018-02-10 VITALS — BP 122/80 | HR 61 | Temp 97.2°F | Ht 63.0 in | Wt 160.0 lb

## 2018-02-10 DIAGNOSIS — Z952 Presence of prosthetic heart valve: Secondary | ICD-10-CM

## 2018-02-10 DIAGNOSIS — Z7901 Long term (current) use of anticoagulants: Secondary | ICD-10-CM

## 2018-02-10 DIAGNOSIS — I480 Paroxysmal atrial fibrillation: Secondary | ICD-10-CM

## 2018-02-10 DIAGNOSIS — Z23 Encounter for immunization: Secondary | ICD-10-CM | POA: Diagnosis not present

## 2018-02-10 LAB — POCT INR: INR: 2.1 (ref 2.0–3.0)

## 2018-02-10 MED ORDER — WARFARIN SODIUM 4 MG PO TABS: ORAL_TABLET | ORAL | 6 refills | Status: DC

## 2018-02-10 NOTE — Patient Instructions (Signed)
INR 2.1 TODAY  Take 1/2 tablet (2mg ) coumadin tonight then continue 4mg  every evening beginning on 02/11/18  IF YOU MISS A DOSE, DO NOT DOUBLE UP - JUST CONTINUE DOSING AS ORDERED THE NEXT DAY  Follow up in 1 week for INR management

## 2018-02-10 NOTE — Progress Notes (Signed)
Patient ID: Jodi Frank, female   DOB: Feb 03, 1941, 77 y.o.   MRN: [id number]   Location:  [facility] OFFICE  Provider: DR Arletha Grippe  Code Status:  Goals of Care:  Advanced Directives 11/23/2017  Does Patient Have a Medical Advance Directive? Yes  Type of Paramedic of [place];Living will  Does patient want to make changes to medical advance directive? -  Copy of [place] in Chart? Yes  Would patient like information on creating a medical advance directive? -  Pre-existing out of facility DNR order (yellow form or pink MOST form) -     Chief Complaint  Patient presents with  . Coagulation Disorder    4 week PT/INR Check, no missed doses, no bleeding, and no diet changes   . Immunizations    Flu Vaccine     HPI: Patient is a 77 y.o. female seen today for coumadin mx.  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 Takes coumadin. INR GOAL 2.5-3.5 due to mechanical valve. Rate controlled with metoprolol. 2D echo revealed EF 65-70% in Dec 2018. INR 2.1 today. She missed coumadin dose last night and took it this AM. She also takes ASA daily. She stopped losartan 3 weeks ago 2/2 dizziness. No increased Vit K. No easy bruising   Past Medical History:  Diagnosis Date  . Broken hip ([place]) 10/2011   left  . Broken wrist   . Depression   . Diarrhea   . H/O long-term (current) use of anticoagulants   . High cholesterol   . Hypertension   . Memory difficulty 01/18/2016  . 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 Osteoporosis   . SIADH (syndrome of inappropriate ADH production) ([place])   . Urinary retention    chronic  . Vertigo 01/18/2016    Past Surgical History:  Procedure Laterality Date  . HEMATOMA EVACUATION Left 11/25/2012   Procedure: EVACUATION HEMATOMA;  Surgeon: Linna Hoff, MD;  Location: [place];  Service: Orthopedics;  Laterality: Left;  . HIP PINNING,CANNULATED Left 11/03/2012   Procedure: CANNULATED HIP PINNING;   Surgeon: Tobi Bastos, MD;  Location: [facility];  Service: Orthopedics;  Laterality: Left;  . IR INJECT/THERA/INC NEEDLE/CATH/PLC EPI/LUMB/SAC W/IMG  07/20/2017  . 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  . OPEN REDUCTION INTERNAL FIXATION (ORIF) WRIST WITH ILIAC CREST BONE GRAFT Left 11/24/2012   Procedure: OPEN REDUCTION INTERNAL FIXATION LEFT WRIST POSSIBLE BONE GRAFTING AND PINNING (ORIF) ;  Surgeon: Linna Hoff, MD;  Location: [place];  Service: Orthopedics;  Laterality: Left;  . SHOULDER OPEN ROTATOR CUFF REPAIR       reports that she has never smoked. She has never used smokeless tobacco. She reports that she does not drink alcohol or use drugs. Social History   Socioeconomic History  . Marital status: Divorced    Spouse name: Not on file  . Number of children: 1  . Years of education: BS  . Highest education level: Not on file  Occupational History  . Occupation: Retired  Scientific laboratory technician  . Financial resource strain: Not hard at all  . Food insecurity:    Worry: Never true    Inability: Never true  . Transportation needs:    Medical: No    Non-medical: No  Tobacco Use  . Smoking status: Never Smoker  . Smokeless tobacco: Never Used  Substance and Sexual Activity  . Alcohol use: No  Frequency: Never  . Drug use: No  . Sexual activity: Not Currently  Lifestyle  . Physical activity:    Days per week: 0 days    Minutes per session: 0 min  . Stress: Not at all  Relationships  . Social connections:    Talks on phone: Twice a week    Gets together: Twice a week    Attends religious service: Never    Active member of club or organization: No    Attends meetings of clubs or organizations: Never    Relationship status: Divorced  . Intimate partner violence:    Fear of current or ex partner: No    Emotionally abused: No    Physically abused: No    Forced sexual activity: No  Other Topics Concern  . Not on file  Social History Narrative   Lives at home,  alone   Right-handed   Caffeine: 2 cups per day    Family History  Problem Relation Age of Onset  . Stroke Mother   . Hypertension Mother   . Stroke Father   . Colon cancer Paternal Grandmother   . Parkinsonism Brother     Allergies  Allergen Reactions  . Carvedilol Itching  . Ace Inhibitors Other (See Comments)    Reaction (??)  . Amlodipine Swelling  . Hydrochlorothiazide Other (See Comments)    Hyponatremia     Outpatient Encounter Medications as of 02/10/2018  Medication Sig  . alendronate (FOSAMAX) 70 MG tablet Take 1 tablet (70 mg total) by mouth every Sunday. Take with a full glass of water on an empty stomach.  Marland Kitchen aspirin EC 81 MG tablet Take 1 tablet (81 mg total) by mouth daily.  Marland Kitchen atorvastatin (LIPITOR) 10 MG tablet Take 1 tablet (10 mg total) by mouth at bedtime.  . metoprolol succinate (TOPROL-XL) 50 MG 24 hr tablet Take 1 tablet (50 mg total) by mouth daily.  . Omega-3 Fatty Acids (OMEGA-3 FISH OIL) 1200 MG CAPS Take 2 capsules (2,400 mg total) by mouth 2 (two) times daily.  Marland Kitchen warfarin (COUMADIN) 4 MG tablet Take as prescribed  . [DISCONTINUED] losartan (COZAAR) 100 MG tablet Take 1 tablet (100 mg total) by mouth daily.   No facility-administered encounter medications on file as of 02/10/2018.     Review of Systems:  Review of Systems  Neurological: Positive for dizziness.  All other systems reviewed and are negative.   Health Maintenance  Topic Date Due  . TETANUS/TDAP  07/10/2015  . COLONOSCOPY  09/20/2015  . INFLUENZA VACCINE  12/24/2017  . DEXA SCAN  Completed  . PNA vac Low Risk Adult  Completed    Physical Exam: Vitals:   02/10/18 1412  BP: 122/80  Pulse: 61  Temp: (!) 97.2 F (36.2 C)  TempSrc: Oral  SpO2: 93%  Weight: 160 lb (72.6 kg)  Height: 5\' 3"  (1.6 m)   Body mass index is 28.34 kg/m. Physical Exam  Constitutional: She is oriented to person, place, and time. She appears well-developed and well-nourished.  Neurological: She  is alert and oriented to person, place, and time.  Skin: Skin is warm and dry. No rash noted.  Psychiatric: She has a normal mood and affect. Her behavior is normal. Judgment and thought content normal.    Labs reviewed: Basic Metabolic Panel: Recent Labs    02/27/17  07/17/17 0138  07/21/17 0528 07/22/17 0520 10/02/17 1000  NA 130*   < > 130*   < > 131* 130* 136  K 4.8   < > 4.4   < > 4.2 4.6 4.2  CL  --    < > 98*   < > 99* 98* 102  CO2  --    < > 23   < > 23 24 27   GLUCOSE  --    < > 110*   < > 119* 112* 105*  BUN 12   < > 15   < > 18 16 12   CREATININE 0.7   < > 0.59   < > 0.59 0.60 0.70  CALCIUM  --    < > 8.2*   < > 9.3 9.1 9.7  MG  --   --  2.1  --  1.9  --   --   TSH 0.62  --   --   --   --   --  0.72   < > = values in this interval not displayed.   Liver Function Tests: Recent Labs    07/18/17 0014 07/21/17 0528 07/22/17 0520 10/02/17 1000  AST 30 18 25 15   ALT 43 24 31 12   ALKPHOS 62 58 55  --   BILITOT 3.9* 2.4* 1.9* 0.9  PROT 6.4* 6.3* 6.1* 6.8  ALBUMIN 3.7 3.4* 3.3*  --    No results for input(s): LIPASE, AMYLASE in the last 8760 hours. Recent Labs    07/16/17 0519  AMMONIA 31   CBC: Recent Labs    07/16/17 0520  07/21/17 0528 07/22/17 0520 10/02/17 1000  WBC 21.3*   < > 27.1* 21.2* 8.7  NEUTROABS 16.6*  --  23.0*  --  6,012  HGB 8.4*   < > 10.4* 10.3* 13.5  HCT 24.9*   < > 31.2* 31.1* 41.7  MCV 85.6   < > 90.2 90.9 88.5  PLT 324   < > 332 332 353   < > = values in this interval not displayed.   Lipid Panel: Recent Labs    02/27/17 10/02/17 1000  CHOL 192 185  HDL 84* 71  LDLCALC 96 101*  TRIG 62 50  CHOLHDL  --  2.6   No results found for: HGBA1C  Procedures since last visit: No results found.  Assessment/Plan   ICD-10-CM   1. Long term (current) use of anticoagulants Z79.01 POC INR  2. H/O mitral valve replacement with mechanical valve Z95.2 warfarin (COUMADIN) 4 MG tablet  3. Paroxysmal atrial fibrillation (HCC) I48.0  warfarin (COUMADIN) 4 MG tablet  4. Need for immunization against influenza Z23 Flu Vaccine QUAD 36+ mos IM   INR 2.1 TODAY  Take 1/2 tablet (2mg ) coumadin tonight then continue 4mg  every evening beginning on 02/11/18  IF YOU MISS A DOSE, DO NOT DOUBLE UP - JUST CONTINUE DOSING AS ORDERED THE NEXT DAY  Follow up in 1 week for INR management    Krystyl Cannell S. Perlie Gold  [facility] [address] [place], [place] [address] [phone] Cell (Monday-Friday 8 AM - 5 PM) [phone] After 5 PM and follow prompts

## 2018-02-16 ENCOUNTER — Other Ambulatory Visit: Payer: Medicare Other

## 2018-02-16 DIAGNOSIS — I480 Paroxysmal atrial fibrillation: Secondary | ICD-10-CM

## 2018-02-16 DIAGNOSIS — Z7901 Long term (current) use of anticoagulants: Secondary | ICD-10-CM

## 2018-02-17 LAB — PROTIME-INR
INR: 2.4 — AB
PROTHROMBIN TIME: 25.3 s — AB (ref 9.0–11.5)

## 2018-03-02 ENCOUNTER — Ambulatory Visit (INDEPENDENT_AMBULATORY_CARE_PROVIDER_SITE_OTHER): Payer: Medicare Other | Admitting: Nurse Practitioner

## 2018-03-02 ENCOUNTER — Encounter: Payer: Self-pay | Admitting: Nurse Practitioner

## 2018-03-02 VITALS — BP 120/78 | HR 68 | Temp 98.0°F | Ht 63.0 in | Wt 162.0 lb

## 2018-03-02 DIAGNOSIS — Z952 Presence of prosthetic heart valve: Secondary | ICD-10-CM

## 2018-03-02 DIAGNOSIS — Z7901 Long term (current) use of anticoagulants: Secondary | ICD-10-CM

## 2018-03-02 DIAGNOSIS — I480 Paroxysmal atrial fibrillation: Secondary | ICD-10-CM

## 2018-03-02 LAB — POCT INR: INR: 2.8 (ref 2.0–3.0)

## 2018-03-02 NOTE — Progress Notes (Signed)
Careteam: Patient Care Team: Jodi Cranker, DO as PCP - General (Internal Medicine)  Advanced Directive information Does Patient Have a Medical Advance Directive?: Yes, Type of Advance Directive: [place];Living will  Allergies  Allergen Reactions  . Carvedilol Itching  . Ace Inhibitors Other (See Comments)    Reaction (??)  . Amlodipine Swelling  . Hydrochlorothiazide Other (See Comments)    Hyponatremia     Chief Complaint  Patient presents with  . Follow-up    pt is being seen for a 2 week INR check. Current INR is 2.8. Previous INR was 2.4 on 02/16/18     HPI: Patient is a 77 y.o. female seen in the office today coumadin management.   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 INR GOAL 2.5-3.5 due to mechanical valve. Rate controlled with metoprolol. 2D echo revealed EF 65-70% in Dec 2018. INR 2.8 today No missed doses. No changes in medication. No abnormal bleeding or bruises. No falls.  Has been on current dose for "forever"- since June 2019 she has been on coumadin 4 mg daily  Review of Systems:  Review of Systems  Constitutional: Negative for chills, diaphoresis, fever and malaise/fatigue.  HENT: Negative for nosebleeds.   Respiratory: Negative for cough, hemoptysis and shortness of breath.   Cardiovascular: Negative for chest pain and leg swelling.  Gastrointestinal: Negative for blood in stool and melena.  Genitourinary: Negative for dysuria and hematuria.  Musculoskeletal: Negative for falls.  Neurological: Negative for dizziness.  Endo/Heme/Allergies: Bruises/bleeds easily.   Past Medical History:  Diagnosis Date  . Broken hip ([place]) 10/2011   left  . Broken wrist   . Depression   . Diarrhea   . H/O long-term (current) use of anticoagulants   . High cholesterol   . Hypertension   . Memory difficulty 01/18/2016  . 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 Osteoporosis   . SIADH (syndrome of  inappropriate ADH production) ([place])   . Urinary retention    chronic  . Vertigo 01/18/2016   Past Surgical History:  Procedure Laterality Date  . HEMATOMA EVACUATION Left 11/25/2012   Procedure: EVACUATION HEMATOMA;  Surgeon: Linna Hoff, MD;  Location: [place];  Service: Orthopedics;  Laterality: Left;  . HIP PINNING,CANNULATED Left 11/03/2012   Procedure: CANNULATED HIP PINNING;  Surgeon: Tobi Bastos, MD;  Location[facility];  Service: Orthopedics;  Laterality: Left;  . IR INJECT/THERA/INC NEEDLE/CATH/PLC EPI/LUMB/SAC W/IMG  07/20/2017  . 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  . OPEN REDUCTION INTERNAL FIXATION (ORIF) WRIST WITH ILIAC CREST BONE GRAFT Left 11/24/2012   Procedure: OPEN REDUCTION INTERNAL FIXATION LEFT WRIST POSSIBLE BONE GRAFTING AND PINNING (ORIF) ;  Surgeon: Linna Hoff, MD;  Location: [place];  Service: Orthopedics;  Laterality: Left;  . SHOULDER OPEN ROTATOR CUFF REPAIR     Social History:   reports that she has never smoked. She has never used smokeless tobacco. She reports that she does not drink alcohol or use drugs.  Family History  Problem Relation Age of Onset  . Stroke Mother   . Hypertension Mother   . Stroke Father   . Colon cancer Paternal Grandmother   . Parkinsonism Brother     Medications: Patient's Medications  New Prescriptions   No medications on file  Previous Medications   ALENDRONATE (FOSAMAX) 70 MG TABLET    Take 1 tablet (70 mg total) by mouth  every Sunday. Take with a full glass of water on an empty stomach.   ASPIRIN EC 81 MG TABLET    Take 1 tablet (81 mg total) by mouth daily.   ATORVASTATIN (LIPITOR) 10 MG TABLET    Take 1 tablet (10 mg total) by mouth at bedtime.   METOPROLOL SUCCINATE (TOPROL-XL) 50 MG 24 HR TABLET    Take 1 tablet (50 mg total) by mouth daily.   OMEGA-3 FATTY ACIDS (OMEGA-3 FISH OIL) 1200 MG CAPS    Take 2 capsules (2,400 mg total) by mouth 2 (two) times daily.   WARFARIN (COUMADIN) 4 MG TABLET     Take po every evening as directed  Modified Medications   No medications on file  Discontinued Medications   No medications on file     Physical Exam:  Vitals:   03/02/18 1331  BP: 120/78  Pulse: 68  Temp: 98 F (36.7 C)  TempSrc: Oral  SpO2: 96%  Weight: 162 lb (73.5 kg)  Height: 5\' 3"  (1.6 m)   Body mass index is 28.7 kg/m.  Physical Exam  Constitutional: She appears well-developed and well-nourished.  Eyes: Pupils are equal, round, and reactive to light.  Neck: Neck supple. Carotid bruit is not present.  Cardiovascular: Normal rate and intact distal pulses. An irregularly irregular rhythm present. Exam reveals no gallop and no friction rub.  Murmur heard.  Systolic murmur is present with a grade of 1/6. Pulmonary/Chest: Effort normal and breath sounds normal.  Abdominal: Normal appearance. There is no hepatomegaly. There is no rigidity.  Musculoskeletal: She exhibits no edema.  Neurological: She is alert. She has normal reflexes.  Skin: Skin is warm and dry. No rash noted.  Psychiatric: She has a normal mood and affect. Her behavior is normal. Thought content normal.    Labs reviewed: Basic Metabolic Panel: Recent Labs    07/17/17 0138  07/21/17 0528 07/22/17 0520 10/02/17 1000  NA 130*   < > 131* 130* 136  K 4.4   < > 4.2 4.6 4.2  CL 98*   < > 99* 98* 102  CO2 23   < > 23 24 27   GLUCOSE 110*   < > 119* 112* 105*  BUN 15   < > 18 16 12   CREATININE 0.59   < > 0.59 0.60 0.70  CALCIUM 8.2*   < > 9.3 9.1 9.7  MG 2.1  --  1.9  --   --   TSH  --   --   --   --  0.72   < > = values in this interval not displayed.   Liver Function Tests: Recent Labs    07/18/17 0014 07/21/17 0528 07/22/17 0520 10/02/17 1000  AST 30 18 25 15   ALT 43 24 31 12   ALKPHOS 62 58 55  --   BILITOT 3.9* 2.4* 1.9* 0.9  PROT 6.4* 6.3* 6.1* 6.8  ALBUMIN 3.7 3.4* 3.3*  --    No results for input(s): LIPASE, AMYLASE in the last 8760 hours. Recent Labs    07/16/17 0519  AMMONIA  31   CBC: Recent Labs    07/16/17 0520  07/21/17 0528 07/22/17 0520 10/02/17 1000  WBC 21.3*   < > 27.1* 21.2* 8.7  NEUTROABS 16.6*  --  23.0*  --  6,012  HGB 8.4*   < > 10.4* 10.3* 13.5  HCT 24.9*   < > 31.2* 31.1* 41.7  MCV 85.6   < > 90.2 90.9 88.5  PLT 324   < > 332 332 353   < > = values in this interval not displayed.   Lipid Panel: Recent Labs    10/02/17 1000  CHOL 185  HDL 71  LDLCALC 101*  TRIG 50  CHOLHDL 2.6   TSH: Recent Labs    10/02/17 1000  TSH 0.72   A1C: No results found for: HGBA1C   Assessment/Plan 1. Long term (current) use of anticoagulants 2. Paroxysmal atrial fibrillation (HCC) 3. H/O mitral valve replacement with mechanical valve -POC INR at goal at 2.8, will continue 4 mg of coumadin daily  Continues on metoprolol succinate for rate control.   Next appt: Follow up in 1 month for routine follow up and INR Jeyda Siebel K. [place], [facility] [phone]

## 2018-03-02 NOTE — Patient Instructions (Addendum)
RESCHEDULE 11/18 appt to 1 month and will get INR at that time To continue current coumadin dose (4 mg by mouth daily)

## 2018-03-10 ENCOUNTER — Other Ambulatory Visit: Payer: Self-pay | Admitting: Internal Medicine

## 2018-03-12 ENCOUNTER — Telehealth: Payer: Self-pay

## 2018-03-12 ENCOUNTER — Encounter: Payer: Self-pay | Admitting: Internal Medicine

## 2018-03-12 NOTE — Telephone Encounter (Signed)
I am not showing trazodone on her med list. What is the dose and how often is it written to take?    ----- Message -----  From: Denyse Amass, CMA  Sent: 03/12/2018 12:57 PM EDT  To: Gildardo Cranker, DO  Subject: FW: Non-Urgent Medical Question               ----- Message -----  From: Tod Persia  Sent: 03/12/2018 12:17 PM EDT  To: [facility] Clinical Pool  Subject: Non-Urgent Medical Question             I need something to help me sleep. I have trazodone and it's not working.  Thanks, Sahory Nordling     Trazodone was discontinued from medication list on 01/15/18 due to patient preference. I left a message asking patient to call the office to discuss whether or not she is actually taking this medication.

## 2018-03-15 NOTE — Telephone Encounter (Signed)
I left a message asking that patient call the office.

## 2018-03-15 NOTE — Telephone Encounter (Signed)
Patient stated that she only takes the trazodone 50 mg when needed. She says that she may take 1 pill a month. She stated that sometimes the trazodone works and sometimes it does not. She would like to know what else Dr. Eulas Post can recommend that she take to help her sleep.

## 2018-03-15 NOTE — Telephone Encounter (Signed)
Recommend she takes trazodone qhs for insomnia; if this does not work, recommend she takes melatonin with trazodone

## 2018-03-15 NOTE — Telephone Encounter (Signed)
Patient verbalized understanding. She did not have any additional questions at this time. Medication list was updated.

## 2018-03-30 ENCOUNTER — Ambulatory Visit (INDEPENDENT_AMBULATORY_CARE_PROVIDER_SITE_OTHER): Payer: Medicare Other | Admitting: Nurse Practitioner

## 2018-03-30 ENCOUNTER — Encounter: Payer: Self-pay | Admitting: Cardiology

## 2018-03-30 ENCOUNTER — Encounter: Payer: Self-pay | Admitting: Nurse Practitioner

## 2018-03-30 VITALS — BP 162/104 | HR 75 | Temp 98.2°F | Ht 63.0 in | Wt 163.6 lb

## 2018-03-30 DIAGNOSIS — I10 Essential (primary) hypertension: Secondary | ICD-10-CM

## 2018-03-30 DIAGNOSIS — M810 Age-related osteoporosis without current pathological fracture: Secondary | ICD-10-CM

## 2018-03-30 DIAGNOSIS — I480 Paroxysmal atrial fibrillation: Secondary | ICD-10-CM | POA: Diagnosis not present

## 2018-03-30 DIAGNOSIS — E782 Mixed hyperlipidemia: Secondary | ICD-10-CM | POA: Diagnosis not present

## 2018-03-30 DIAGNOSIS — Z6828 Body mass index (BMI) 28.0-28.9, adult: Secondary | ICD-10-CM

## 2018-03-30 DIAGNOSIS — E663 Overweight: Secondary | ICD-10-CM

## 2018-03-30 DIAGNOSIS — Z952 Presence of prosthetic heart valve: Secondary | ICD-10-CM

## 2018-03-30 DIAGNOSIS — R739 Hyperglycemia, unspecified: Secondary | ICD-10-CM | POA: Diagnosis not present

## 2018-03-30 DIAGNOSIS — Z7901 Long term (current) use of anticoagulants: Secondary | ICD-10-CM

## 2018-03-30 LAB — POCT INR: INR: 2.7 (ref 2.0–3.0)

## 2018-03-30 MED ORDER — WARFARIN SODIUM 4 MG PO TABS: ORAL_TABLET | ORAL | 6 refills | Status: DC

## 2018-03-30 NOTE — Progress Notes (Signed)
Careteam: Patient Care Team: Gildardo Cranker, DO as PCP - General (Internal Medicine)  Advanced Directive information Does Patient Have a Medical Advance Directive?: Yes, Type of Advance Directive: [place];Living will  Allergies  Allergen Reactions  . Carvedilol Itching  . Ace Inhibitors Other (See Comments)    Reaction (??)  . Amlodipine Swelling  . Hydrochlorothiazide Other (See Comments)    Hyponatremia     Chief Complaint  Patient presents with  . Medical Management of Chronic Issues    Pt is being seen for a routine visit. Current INR is 2.7.      HPI: Patient is a 77 y.o. female seen in the office today for routine follow up former pt of Dr Saralyn Pilar.   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 Takes coumadin. INR GOAL 2.5-3.5 due to mechanical valve. Previously had been rate controlled with metoprolol (not currently taking)  2D echo revealed EF 65-70% in Dec 2018. INR 2.7. She is followed by coumadin clinic at [facility]. Followed by cardio Dr Radford Pax. Plan to get 2D echo in Dec 2019. She was started on ASA daily.  Osteoporosis- last bone density was 01/25/2016, currently on fosamax weekly with cal and vit d, participates in walking daily for weight bearing activity.    Hyperlipidemia- taking lipitor 10 mg daily  Insomnia- sleeping well, takes trazodone as needed, rarely will need.   Does not wish to be screened for colon cancer any more, reports "if I get cancer I am okay with it" declines cologuard screening as well.  Also declines mammogram, "if I get cancer I would not treat it, have legal stuff in place"  Review of Systems:  Review of Systems  Constitutional: Negative for chills, diaphoresis, fever and malaise/fatigue.  HENT: Negative for nosebleeds.   Respiratory: Negative for cough, hemoptysis and shortness of breath.   Cardiovascular: Negative for chest pain and leg swelling.  Gastrointestinal: Negative for blood in stool and  melena.  Genitourinary: Negative for dysuria and hematuria.  Musculoskeletal: Negative for falls.  Neurological: Negative for dizziness and headaches.  Endo/Heme/Allergies: Bruises/bleeds easily.  Psychiatric/Behavioral: Positive for memory loss. Negative for depression. The patient is not nervous/anxious and does not have insomnia.     Past Medical History:  Diagnosis Date  . Broken hip ([place]) 10/2011   left  . Broken wrist   . Depression   . Diarrhea   . H/O long-term (current) use of anticoagulants   . High cholesterol   . Hypertension   . Memory difficulty 01/18/2016  . 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 Osteoporosis   . SIADH (syndrome of inappropriate ADH production) ([place])   . Urinary retention    chronic  . Vertigo 01/18/2016   Past Surgical History:  Procedure Laterality Date  . HEMATOMA EVACUATION Left 11/25/2012   Procedure: EVACUATION HEMATOMA;  Surgeon: Linna Hoff, MD;  Location: [place];  Service: Orthopedics;  Laterality: Left;  . HIP PINNING,CANNULATED Left 11/03/2012   Procedure: CANNULATED HIP PINNING;  Surgeon: Tobi Bastos, MD;  Location: [facility];  Service: Orthopedics;  Laterality: Left;  . IR INJECT/THERA/INC NEEDLE/CATH/PLC EPI/LUMB/SAC W/IMG  07/20/2017  . 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  . OPEN REDUCTION INTERNAL FIXATION (ORIF) WRIST WITH ILIAC CREST BONE GRAFT Left 11/24/2012   Procedure: OPEN REDUCTION INTERNAL FIXATION LEFT WRIST POSSIBLE BONE GRAFTING AND PINNING (ORIF) ;  Surgeon: Linna Hoff, MD;  Location: [facility]  OR;  Service: Orthopedics;  Laterality: Left;  . SHOULDER OPEN ROTATOR CUFF REPAIR     Social History:   reports that she has never smoked. She has never used smokeless tobacco. She reports that she does not drink alcohol or use drugs.  Family History  Problem Relation Age of Onset  . Stroke Mother   . Hypertension Mother   . Stroke Father   . Colon cancer Paternal Grandmother   . Parkinsonism Brother      Medications: Patient's Medications  New Prescriptions   No medications on file  Previous Medications   ALENDRONATE (FOSAMAX) 70 MG TABLET    Take 1 tablet (70 mg total) by mouth every Sunday. Take with a full glass of water on an empty stomach.   ASPIRIN EC 81 MG TABLET    Take 1 tablet (81 mg total) by mouth daily.   ATORVASTATIN (LIPITOR) 10 MG TABLET    TAKE ONE TABLET BY MOUTH AT BEDTIME   METOPROLOL SUCCINATE (TOPROL-XL) 50 MG 24 HR TABLET    Take 1 tablet (50 mg total) by mouth daily.   OMEGA 3 1200 MG CAPS    Take 1 capsule by mouth 2 (two) times daily.   TRAZODONE (DESYREL) 50 MG TABLET    Take 50 mg by mouth at bedtime.   WARFARIN (COUMADIN) 4 MG TABLET    Take po every evening as directed  Modified Medications   No medications on file  Discontinued Medications   OMEGA-3 FATTY ACIDS (OMEGA-3 FISH OIL) 1200 MG CAPS    Take 2 capsules (2,400 mg total) by mouth 2 (two) times daily.     Physical Exam:  Vitals:   03/30/18 1413  Pulse: 75  Temp: 98.2 F (36.8 C)  TempSrc: Oral  SpO2: 98%  Weight: 163 lb 9.6 oz (74.2 kg)  Height: 5\' 3"  (1.6 m)   Body mass index is 28.98 kg/m.  Physical Exam  Constitutional: She appears well-developed and well-nourished.  Eyes: Pupils are equal, round, and reactive to light.  Neck: Neck supple. Carotid bruit is not present.  Cardiovascular: Normal rate and intact distal pulses. An irregularly irregular rhythm present. Exam reveals no gallop and no friction rub.  Murmur heard.  Systolic murmur is present with a grade of 1/6. Pulmonary/Chest: Effort normal and breath sounds normal.  Abdominal: Soft. Normal appearance and bowel sounds are normal. There is no hepatomegaly. There is no rigidity.  Musculoskeletal: She exhibits no edema.  Neurological: She is alert. She has normal reflexes.  Forgetful   Skin: Skin is warm and dry. No rash noted.  Psychiatric: She has a normal mood and affect. Her behavior is normal. Thought content  normal.    Labs reviewed: Basic Metabolic Panel: Recent Labs    07/17/17 0138  07/21/17 0528 07/22/17 0520 10/02/17 1000  NA 130*   < > 131* 130* 136  K 4.4   < > 4.2 4.6 4.2  CL 98*   < > 99* 98* 102  CO2 23   < > 23 24 27   GLUCOSE 110*   < > 119* 112* 105*  BUN 15   < > 18 16 12   CREATININE 0.59   < > 0.59 0.60 0.70  CALCIUM 8.2*   < > 9.3 9.1 9.7  MG 2.1  --  1.9  --   --   TSH  --   --   --   --  0.72   < > = values in this interval  not displayed.   Liver Function Tests: Recent Labs    07/18/17 0014 07/21/17 0528 07/22/17 0520 10/02/17 1000  AST 30 18 25 15   ALT 43 24 31 12   ALKPHOS 62 58 55  --   BILITOT 3.9* 2.4* 1.9* 0.9  PROT 6.4* 6.3* 6.1* 6.8  ALBUMIN 3.7 3.4* 3.3*  --    No results for input(s): LIPASE, AMYLASE in the last 8760 hours. Recent Labs    07/16/17 0519  AMMONIA 31   CBC: Recent Labs    07/16/17 0520  07/21/17 0528 07/22/17 0520 10/02/17 1000  WBC 21.3*   < > 27.1* 21.2* 8.7  NEUTROABS 16.6*  --  23.0*  --  6,012  HGB 8.4*   < > 10.4* 10.3* 13.5  HCT 24.9*   < > 31.2* 31.1* 41.7  MCV 85.6   < > 90.2 90.9 88.5  PLT 324   < > 332 332 353   < > = values in this interval not displayed.   Lipid Panel: Recent Labs    10/02/17 1000  CHOL 185  HDL 71  LDLCALC 101*  TRIG 50  CHOLHDL 2.6   TSH: Recent Labs    10/02/17 1000  TSH 0.72   A1C: No results found for: HGBA1C   Assessment/Plan 1. Long term (current) use of anticoagulants - POC INR at goal at 2.7, goal 2.5-3.5, cont coumadin 4 mg daily, to follow up INR in 1 month  2. H/O mitral valve replacement with mechanical valve - CBC with Differential/Platelets - COMPLETE METABOLIC PANEL WITH GFR - warfarin (COUMADIN) 4 MG tablet; Take po every evening as directed  Dispense: 30 tablet; Refill: 6  3. Paroxysmal atrial fibrillation (HCC) Stable, will continue on current dose of coumadin, INR at goal  - CBC with Differential/Platelets - COMPLETE METABOLIC PANEL WITH  GFR - warfarin (COUMADIN) 4 MG tablet; Take po every evening as directed  Dispense: 30 tablet; Refill: 6  4. Essential hypertension, benign -has not taken metoprolol succinate today, encouraged compliance with this medication as bp is elevated.   5. Mixed hyperlipidemia -continues on lipitor 10 mg daily  - COMPLETE METABOLIC PANEL WITH GFR  6. Age related osteoporosis, unspecified pathological fracture presence -on fosamax for OP, with cal and vit d with weight bearing activity. -due for dexa scan - DG Bone Density; Future  7. Hyperglycemia Will get Hemoglobin A1c  8. Body mass index 28.0-28.9, adult Noted today  9. Overweight Noted today, discussed changes in lifestyle including diet and exercise.    Next appt: 04/29/2018 Carlos American. [place], [facility] [phone]

## 2018-03-30 NOTE — Patient Instructions (Addendum)
Follow up in 1 month for INR  Follow up in 6 months for routine visit.    Fat and Cholesterol Restricted Diet Getting too much fat and cholesterol in your diet may cause health problems. Following this diet helps keep your fat and cholesterol at normal levels. This can keep you from getting sick. What types of fat should I choose?  Choose monosaturated and polyunsaturated fats. These are found in foods such as olive oil, canola oil, flaxseeds, walnuts, almonds, and seeds.  Eat more omega-3 fats. Good choices include salmon, mackerel, sardines, tuna, flaxseed oil, and ground flaxseeds.  Limit saturated fats. These are in animal products such as meats, butter, and cream. They can also be in plant products such as palm oil, palm kernel oil, and coconut oil.  Avoid foods with partially hydrogenated oils in them. These contain trans fats. Examples of foods that have trans fats are stick margarine, some tub margarines, cookies, crackers, and other baked goods. What general guidelines do I need to follow?  Check food labels. Look for the words "trans fat" and "saturated fat."  When preparing a meal: ? Fill half of your plate with vegetables and green salads. ? Fill one fourth of your plate with whole grains. Look for the word "whole" as the first word in the ingredient list. ? Fill one fourth of your plate with lean protein foods.  Eat more foods that have fiber, like apples, carrots, beans, peas, and barley.  Eat more home-cooked foods. Eat less at restaurants and buffets.  Limit or avoid alcohol.  Limit foods high in starch and sugar.  Limit fried foods.  Cook foods without frying them. Baking, boiling, grilling, and broiling are all great options.  Lose weight if you are overweight. Losing even a small amount of weight can help your overall health. It can also help prevent diseases such as diabetes and heart disease. What foods can I eat? Grains Whole grains, such as whole wheat  or whole grain breads, crackers, cereals, and pasta. Unsweetened oatmeal, bulgur, barley, quinoa, or brown rice. Corn or whole wheat flour tortillas. Vegetables Fresh or frozen vegetables (raw, steamed, roasted, or grilled). Green salads. Fruits All fresh, canned (in natural juice), or frozen fruits. Meat and Other Protein Products Ground beef (85% or leaner), grass-fed beef, or beef trimmed of fat. Skinless chicken or [place]. Ground chicken or [place]. Pork trimmed of fat. All fish and seafood. Eggs. Dried beans, peas, or lentils. Unsalted nuts or seeds. Unsalted canned or dry beans. Dairy Low-fat dairy products, such as skim or 1% milk, 2% or reduced-fat cheeses, low-fat ricotta or cottage cheese, or plain low-fat yogurt. Fats and Oils Tub margarines without trans fats. Light or reduced-fat mayonnaise and salad dressings. Avocado. Olive, canola, sesame, or safflower oils. Natural peanut or almond butter (choose ones without added sugar and oil). The items listed above may not be a complete list of recommended foods or beverages. Contact your dietitian for more options. What foods are not recommended? Grains White bread. White pasta. White rice. Cornbread. Bagels, pastries, and croissants. Crackers that contain trans fat. Vegetables White potatoes. Corn. Creamed or fried vegetables. Vegetables in a cheese sauce. Fruits Dried fruits. Canned fruit in light or heavy syrup. Fruit juice. Meat and Other Protein Products Fatty cuts of meat. Ribs, chicken wings, bacon, sausage, bologna, salami, chitterlings, fatback, hot dogs, bratwurst, and packaged luncheon meats. Liver and organ meats. Dairy Whole or 2% milk, cream, half-and-half, and cream cheese. Whole milk cheeses. Whole-fat or sweetened  yogurt. Full-fat cheeses. Nondairy creamers and whipped toppings. Processed cheese, cheese spreads, or cheese curds. Sweets and Desserts Corn syrup, sugars, honey, and molasses. Candy. Jam and jelly. Syrup.  Sweetened cereals. Cookies, pies, cakes, donuts, muffins, and ice cream. Fats and Oils Butter, stick margarine, lard, shortening, ghee, or bacon fat. Coconut, palm kernel, or palm oils. Beverages Alcohol. Sweetened drinks (such as sodas, lemonade, and fruit drinks or punches). The items listed above may not be a complete list of foods and beverages to avoid. Contact your dietitian for more information. This information is not intended to replace advice given to you by your health care provider. Make sure you discuss any questions you have with your health care provider. Document Released: 11/11/2011 Document Revised: 01/17/2016 Document Reviewed: 08/11/2013 Elsevier Interactive Patient Education  [employer].

## 2018-03-31 LAB — COMPLETE METABOLIC PANEL WITH GFR
AG Ratio: 1.6 (calc) (ref 1.0–2.5)
ALBUMIN MSPROF: 4.5 g/dL (ref 3.6–5.1)
ALKALINE PHOSPHATASE (APISO): 66 U/L (ref 33–130)
ALT: 16 U/L (ref 6–29)
AST: 19 U/L (ref 10–35)
BUN: 18 mg/dL (ref 7–25)
CO2: 27 mmol/L (ref 20–32)
CREATININE: 0.77 mg/dL (ref 0.60–0.93)
Calcium: 10.6 mg/dL — ABNORMAL HIGH (ref 8.6–10.4)
Chloride: 99 mmol/L (ref 98–110)
GFR, Est African American: 88 mL/min/{1.73_m2} (ref >=60)
GFR, Est Non African American: 76 mL/min/{1.73_m2} (ref >=60)
GLUCOSE: 99 mg/dL (ref 65–139)
Globulin: 2.9 g/dL (calc) (ref 1.9–3.7)
Potassium: 4.4 mmol/L (ref 3.5–5.3)
Sodium: 135 mmol/L (ref 135–146)
Total Bilirubin: 1.5 mg/dL — ABNORMAL HIGH (ref 0.2–1.2)
Total Protein: 7.4 g/dL (ref 6.1–8.1)

## 2018-03-31 LAB — CBC WITH DIFFERENTIAL/PLATELET
BASOS ABS: 31 {cells}/uL (ref 0–200)
Basophils Relative: 0.4 %
EOS PCT: 1.6 %
Eosinophils Absolute: 123 cells/uL (ref 15–500)
HCT: 45.8 % — ABNORMAL HIGH (ref 35.0–45.0)
HEMOGLOBIN: 14.9 g/dL (ref 11.7–15.5)
Lymphs Abs: 1224 cells/uL (ref 850–3900)
MCH: 28.1 pg (ref 27.0–33.0)
MCHC: 32.5 g/dL (ref 32.0–36.0)
MCV: 86.3 fL (ref 80.0–100.0)
MONOS PCT: 10 %
MPV: 10.2 fL (ref 7.5–12.5)
NEUTROS ABS: 5552 {cells}/uL (ref 1500–7800)
Neutrophils Relative %: 72.1 %
Platelets: 361 10*3/uL (ref 140–400)
RBC: 5.31 10*6/uL — ABNORMAL HIGH (ref 3.80–5.10)
RDW: 13.1 % (ref 11.0–15.0)
Total Lymphocyte: 15.9 %
WBC mixed population: 770 cells/uL (ref 200–950)
WBC: 7.7 10*3/uL (ref 3.8–10.8)

## 2018-03-31 LAB — HEMOGLOBIN A1C
HEMOGLOBIN A1C: 5.7 %{Hb} — AB (ref <5.7)
Mean Plasma Glucose: 117 (calc)
eAG (mmol/L): 6.5 (calc)

## 2018-04-12 ENCOUNTER — Ambulatory Visit: Payer: Medicare Other | Admitting: Nurse Practitioner

## 2018-04-13 ENCOUNTER — Telehealth: Payer: Self-pay

## 2018-04-13 NOTE — Telephone Encounter (Signed)
New message    Just an FYI. We have made several attempts to contact this patient including sending a letter to schedule or reschedule their echocardiogram. We will be removing the patient from the echo WQ.   Thank you 

## 2018-04-29 ENCOUNTER — Ambulatory Visit: Payer: Medicare Other | Admitting: Nurse Practitioner

## 2018-04-29 ENCOUNTER — Encounter: Payer: Self-pay | Admitting: Family

## 2018-04-29 ENCOUNTER — Ambulatory Visit (INDEPENDENT_AMBULATORY_CARE_PROVIDER_SITE_OTHER): Payer: Medicare Other | Admitting: Family

## 2018-04-29 VITALS — BP 138/90 | HR 67 | Temp 97.6°F | Ht 63.0 in | Wt 164.0 lb

## 2018-04-29 DIAGNOSIS — Z952 Presence of prosthetic heart valve: Secondary | ICD-10-CM

## 2018-04-29 DIAGNOSIS — I480 Paroxysmal atrial fibrillation: Secondary | ICD-10-CM

## 2018-04-29 DIAGNOSIS — Z7901 Long term (current) use of anticoagulants: Secondary | ICD-10-CM

## 2018-04-29 DIAGNOSIS — I10 Essential (primary) hypertension: Secondary | ICD-10-CM

## 2018-04-29 LAB — POCT INR: INR: 2.3 (ref 2.0–3.0)

## 2018-04-29 MED ORDER — METOPROLOL SUCCINATE ER 50 MG PO TB24: 50.0000 mg | ORAL_TABLET | Freq: Every day | ORAL | 0 refills | Status: DC

## 2018-04-29 MED ORDER — WARFARIN SODIUM 4 MG PO TABS: ORAL_TABLET | ORAL | 1 refills | Status: DC

## 2018-04-29 NOTE — Progress Notes (Signed)
Provider: Dinah Ngetich FNP-C  [name], NP  Patient Care Team: [name], NP as PCP - General (Geriatric Medicine)  Extended Emergency Contact Information Primary Emergency Contact: Vittorio,Pam Address: [place] [address] Phone: [phone] Mobile Phone: [phone] Relation: Daughter Secondary Emergency Contact: Pilar Plate [place] Phone: [phone] Mobile Phone: [phone] Relation: Other   Goals of care: Advanced Directive information Advanced Directives 03/30/2018  Does Patient Have a Medical Advance Directive? Yes  Type of Paramedic of [place];Living will  Does patient want to make changes to medical advance directive? -  Copy of [place] in Chart? Yes - validated most recent copy scanned in chart (See row information)  Would patient like information on creating a medical advance directive? -  Pre-existing out of facility DNR order (yellow form or pink MOST form) -     Chief Complaint  Patient presents with  . Anticoagulation    PT/INR check, no missed doses, no major diet changes, and no bleeding   . Medication Management    Discuss metoprolol and reason why she is taking  . Medication Refill    Refill coumadin and metoprolol for 90 day supply     HPI:  Pt is a 77 y.o. female was here for INR check.INR today was 2.1 with goal 2.5-3.5 for Afib.On chart review patient is on coumadin 4 mg tablet.Previous INR was 2.7.Patient left prior to being assessed by provider. CMA states patient requested metoprolol to be refilled.   Past Medical History:  Diagnosis Date  . Broken hip ([place]) 10/2011   left  . Broken wrist   . Depression   . Diarrhea   . H/O long-term (current) use of anticoagulants   . High cholesterol   . Hypertension   . Memory difficulty 01/18/2016  . 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  Osteoporosis   . SIADH (syndrome of inappropriate ADH production) ([place])   . Urinary retention    chronic  . Vertigo 01/18/2016   Past Surgical History:  Procedure Laterality Date  . HEMATOMA EVACUATION Left 11/25/2012   Procedure: EVACUATION HEMATOMA;  Surgeon: Linna Hoff, MD;  Location: [place];  Service: Orthopedics;  Laterality: Left;  . HIP PINNING,CANNULATED Left 11/03/2012   Procedure: CANNULATED HIP PINNING;  Surgeon: Tobi Bastos, MD;  Location: [facility];  Service: Orthopedics;  Laterality: Left;  . IR INJECT/THERA/INC NEEDLE/CATH/PLC EPI/LUMB/SAC W/IMG  07/20/2017  . 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  . OPEN REDUCTION INTERNAL FIXATION (ORIF) WRIST WITH ILIAC CREST BONE GRAFT Left 11/24/2012   Procedure: OPEN REDUCTION INTERNAL FIXATION LEFT WRIST POSSIBLE BONE GRAFTING AND PINNING (ORIF) ;  Surgeon: Linna Hoff, MD;  Location: [place];  Service: Orthopedics;  Laterality: Left;  . SHOULDER OPEN ROTATOR CUFF REPAIR      Allergies  Allergen Reactions  . Carvedilol Itching  . Ace Inhibitors Other (See Comments)    Reaction (??)  . Amlodipine Swelling  . Hydrochlorothiazide Other (See Comments)    Hyponatremia     Outpatient Encounter Medications as of 04/29/2018  Medication Sig  . alendronate (FOSAMAX) 70 MG tablet Take 1 tablet (70 mg total) by mouth every Sunday. Take with a full glass of water on an empty stomach.  Marland Kitchen aspirin EC 81 MG tablet Take 1 tablet (81 mg  total) by mouth daily.  Marland Kitchen atorvastatin (LIPITOR) 10 MG tablet TAKE ONE TABLET BY MOUTH AT BEDTIME  . metoprolol succinate (TOPROL-XL) 50 MG 24 hr tablet Take 1 tablet (50 mg total) by mouth daily.  . Omega 3 1200 MG CAPS Take 1 capsule by mouth 2 (two) times daily.  . traZODone (DESYREL) 50 MG tablet Take 50 mg by mouth at bedtime.  Marland Kitchen warfarin (COUMADIN) 4 MG tablet Take po every evening as directed   No facility-administered encounter medications on file as of 04/29/2018.     Review of Systems    Unable to perform ROS: Other (patient left prior to being assessed by provider)    Immunization History  Administered Date(s) Administered  . Influenza, High Dose Seasonal PF 02/17/2014, 02/14/2015  . Influenza, Seasonal, Injecte, Preservative Fre 02/14/2016  . Influenza,inj,Quad PF,6+ Mos 02/10/2018  . Influenza-Unspecified 02/23/2013, 02/26/2017  . Pneumococcal Conjugate-13 08/27/2013  . Pneumococcal Polysaccharide-23 05/03/2007, 09/26/2009, 02/17/2014  . Td 06/26/2005, 07/09/2005  . Tetanus 06/26/2005, 07/09/2005  . Zoster 08/19/2013   Pertinent  Health Maintenance Due  Topic Date Due  . COLONOSCOPY  03/31/2019 (Originally 09/20/2015)  . INFLUENZA VACCINE  Completed  . DEXA SCAN  Completed  . PNA vac Low Risk Adult  Completed   Fall Risk  04/29/2018 03/30/2018 03/02/2018 01/15/2018 11/23/2017  Falls in the past year? 0 0 No No Yes  Number falls in past yr: 0 - - - 2 or more  Injury with Fall? 0 - - - No    Vitals:   04/29/18 1311  BP: 138/90  Pulse: 67  Temp: 97.6 F (36.4 C)  TempSrc: Oral  SpO2: 97%  Weight: 164 lb (74.4 kg)  Height: 5\' 3"  (1.6 m)   Body mass index is 29.05 kg/m. Physical Exam  Labs reviewed: Recent Labs    07/17/17 0138  07/21/17 0528 07/22/17 0520 10/02/17 1000 03/30/18 1456  NA 130*   < > 131* 130* 136 135  K 4.4   < > 4.2 4.6 4.2 4.4  CL 98*   < > 99* 98* 102 99  CO2 23   < > 23 24 27 27   GLUCOSE 110*   < > 119* 112* 105* 99  BUN 15   < > 18 16 12 18   CREATININE 0.59   < > 0.59 0.60 0.70 0.77  CALCIUM 8.2*   < > 9.3 9.1 9.7 10.6*  MG 2.1  --  1.9  --   --   --    < > = values in this interval not displayed.   Recent Labs    07/18/17 0014 07/21/17 0528 07/22/17 0520 10/02/17 1000 03/30/18 1456  AST 30 18 25 15 19   ALT 43 24 31 12 16   ALKPHOS 62 58 55  --   --   BILITOT 3.9* 2.4* 1.9* 0.9 1.5*  PROT 6.4* 6.3* 6.1* 6.8 7.4  ALBUMIN 3.7 3.4* 3.3*  --   --    Recent Labs    07/21/17 0528 07/22/17 0520 10/02/17 1000  03/30/18 1456  WBC 27.1* 21.2* 8.7 7.7  NEUTROABS 23.0*  --  6,012 5,552  HGB 10.4* 10.3* 13.5 14.9  HCT 31.2* 31.1* 41.7 45.8*  MCV 90.2 90.9 88.5 86.3  PLT 332 332 353 361   Lab Results  Component Value Date   TSH 0.72 10/02/2017   Lab Results  Component Value Date   HGBA1C 5.7 (H) 03/30/2018   Lab Results  Component Value Date   CHOL  185 10/02/2017   HDL 71 10/02/2017   LDLCALC 101 (H) 10/02/2017   TRIG 50 10/02/2017   CHOLHDL 2.6 10/02/2017    Significant Diagnostic Results in last 30 days:  No results found.  Assessment/Plan 1. Long term (current) use of anticoagulants INR today 2.1 goal 2.5-3.5 .continue on coumadin 4 mg tablet daily  - POC INR in one month   2. Paroxysmal atrial fibrillation (HCC) HR not assessed patient left before being seen by provider. - POC INR 2.1 today. - warfarin (COUMADIN) 4 MG tablet; Take po every evening as directed  Dispense: 30 tablet; Refill: 1  3. Essential hypertension, benign Requested medication refill. - metoprolol succinate (TOPROL-XL) 50 MG 24 hr tablet; Take 1 tablet (50 mg total) by mouth daily.  Dispense: 90 tablet; Refill: 0  4. H/O mitral valve replacement with mechanical valve - warfarin (COUMADIN) 4 MG tablet; Take po every evening as directed  Dispense: 30 tablet; Refill: 1  Family/ staff Communication: Reviewed plan of care with CMA to notify patient to continue with coumadin 4 mg tablet daily.Follow up in one month for INR check. Labs/tests ordered: Recheck INR in 1 month.  Sandrea Hughs, NP

## 2018-05-28 ENCOUNTER — Ambulatory Visit: Payer: Self-pay | Admitting: Nurse Practitioner

## 2018-06-02 ENCOUNTER — Ambulatory Visit (INDEPENDENT_AMBULATORY_CARE_PROVIDER_SITE_OTHER): Payer: Medicare Other | Admitting: Nurse Practitioner

## 2018-06-02 ENCOUNTER — Encounter: Payer: Self-pay | Admitting: Nurse Practitioner

## 2018-06-02 VITALS — BP 142/80 | HR 70 | Temp 97.3°F | Ht 63.0 in | Wt 164.0 lb

## 2018-06-02 DIAGNOSIS — Z7901 Long term (current) use of anticoagulants: Secondary | ICD-10-CM | POA: Diagnosis not present

## 2018-06-02 DIAGNOSIS — I480 Paroxysmal atrial fibrillation: Secondary | ICD-10-CM | POA: Diagnosis not present

## 2018-06-02 DIAGNOSIS — Z952 Presence of prosthetic heart valve: Secondary | ICD-10-CM

## 2018-06-02 DIAGNOSIS — I10 Essential (primary) hypertension: Secondary | ICD-10-CM

## 2018-06-02 LAB — POCT INR: INR: 2.5 (ref 2.0–3.0)

## 2018-06-02 MED ORDER — WARFARIN SODIUM 4 MG PO TABS: ORAL_TABLET | ORAL | 1 refills | Status: DC

## 2018-06-02 NOTE — Patient Instructions (Signed)
Continue coumadin 4 mg by mouth daily  Repeat INR in 1 month

## 2018-06-02 NOTE — Progress Notes (Signed)
Careteam: Patient Care Team: [name], NP as PCP - General (Geriatric Medicine)  Advanced Directive information    Allergies  Allergen Reactions  . Carvedilol Itching  . Ace Inhibitors Other (See Comments)    Reaction (??)  . Amlodipine Swelling  . Hydrochlorothiazide Other (See Comments)    Hyponatremia     Chief Complaint  Patient presents with  . Anticoagulation    4 week PT/INR check 2.5      HPI: Patient is a 78 y.o. Frank seen in the office today for INR check.   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 Takes coumadin. INR GOAL 2.5-3.5 due to mechanical valve.   2D echo revealed EF 65-70% in Dec 2018. INR 2.1 on last check.   No changes in medication. No missed doses. Occasional nose bleeds in the winter. No abnormal bruising.   Continues to complain of dizziness, uses cane when outside of the house but does not require at home.  No associated room spinning. No HA, sinus/ear pressure/pain, visual changes. No hearing loss. She has a hx dizziness. No relief with meclizine.   Review of Systems:  Review of Systems  Constitutional: Negative for chills, diaphoresis, fever and malaise/fatigue.  HENT: Negative for nosebleeds.   Respiratory: Negative for cough, hemoptysis and shortness of breath.   Cardiovascular: Negative for chest pain and leg swelling.  Gastrointestinal: Negative for blood in stool and melena.  Genitourinary: Negative for dysuria and hematuria.  Musculoskeletal: Negative for falls.  Neurological: Positive for dizziness. Negative for sensory change, speech change, focal weakness, weakness and headaches.  Endo/Heme/Allergies: Bruises/bleeds easily.  Psychiatric/Behavioral: Positive for memory loss. Negative for depression. The patient is not nervous/anxious and does not have insomnia.     Past Medical History:  Diagnosis Date  . Broken hip ([place]) 10/2011   left  . Broken wrist   . Depression   . Diarrhea   . H/O  long-term (current) use of anticoagulants   . High cholesterol   . Hypertension   . Memory difficulty 01/18/2016  . 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 Osteoporosis   . SIADH (syndrome of inappropriate ADH production) ([place])   . Urinary retention    chronic  . Vertigo 01/18/2016   Past Surgical History:  Procedure Laterality Date  . HEMATOMA EVACUATION Left 11/25/2012   Procedure: EVACUATION HEMATOMA;  Surgeon: Linna Hoff, MD;  Location: [place];  Service: Orthopedics;  Laterality: Left;  . HIP PINNING,CANNULATED Left 11/03/2012   Procedure: CANNULATED HIP PINNING;  Surgeon: Tobi Bastos, MD;  Location: [facility];  Service: Orthopedics;  Laterality: Left;  . IR INJECT/THERA/INC NEEDLE/CATH/PLC EPI/LUMB/SAC W/IMG  07/20/2017  . 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  . OPEN REDUCTION INTERNAL FIXATION (ORIF) WRIST WITH ILIAC CREST BONE GRAFT Left 11/24/2012   Procedure: OPEN REDUCTION INTERNAL FIXATION LEFT WRIST POSSIBLE BONE GRAFTING AND PINNING (ORIF) ;  Surgeon: Linna Hoff, MD;  Location: [place];  Service: Orthopedics;  Laterality: Left;  . SHOULDER OPEN ROTATOR CUFF REPAIR     Social History:   reports that she has never smoked. She has never used smokeless tobacco. She reports that she does not drink alcohol or use drugs.  Family History  Problem Relation Age of Onset  . Stroke Mother   . Hypertension Mother   . Stroke Father   . Colon cancer Paternal Grandmother   . Parkinsonism Brother  Medications: Patient's Medications  New Prescriptions   No medications on file  Previous Medications   ALENDRONATE (FOSAMAX) 70 MG TABLET    Take 1 tablet (70 mg total) by mouth every Sunday. Take with a full glass of water on an empty stomach.   ASPIRIN EC 81 MG TABLET    Take 1 tablet (81 mg total) by mouth daily.   ATORVASTATIN (LIPITOR) 10 MG TABLET    TAKE ONE TABLET BY MOUTH AT BEDTIME   METOPROLOL SUCCINATE (TOPROL-XL) 50 MG 24 HR TABLET    Take 1 tablet  (50 mg total) by mouth daily.   OMEGA 3 1200 MG CAPS    Take 1 capsule by mouth 2 (two) times daily.   TRAZODONE (DESYREL) 50 MG TABLET    Take 50 mg by mouth at bedtime as needed.    WARFARIN (COUMADIN) 4 MG TABLET    Take po every evening as directed  Modified Medications   No medications on file  Discontinued Medications   No medications on file     Physical Exam:  Vitals:   06/02/18 1435  BP: (!) 142/80  Pulse: 70  Temp: (!) 97.3 F (36.3 C)  TempSrc: Oral  SpO2: 96%  Weight: 164 lb (74.4 kg)  Height: 5\' 3"  (1.6 m)   Body mass index is 29.05 kg/m.  Physical Exam Constitutional:      Appearance: Normal appearance. She is well-developed.  Eyes:     Pupils: Pupils are equal, round, and reactive to light.  Neck:     Musculoskeletal: Neck supple.     Vascular: No carotid bruit.  Cardiovascular:     Rate and Rhythm: Normal rate. Rhythm irregularly irregular.     Heart sounds: Murmur present. Systolic murmur present with a grade of 1/6. No friction rub. No gallop.   Pulmonary:     Effort: Pulmonary effort is normal.     Breath sounds: Normal breath sounds.  Abdominal:     General: Bowel sounds are normal.     Palpations: Abdomen is soft. Abdomen is not rigid. There is no hepatomegaly.  Skin:    General: Skin is warm and dry.     Findings: No rash.  Neurological:     Mental Status: She is alert.     Deep Tendon Reflexes: Reflexes are normal and symmetric.     Comments: Forgetful   Psychiatric:        Behavior: Behavior normal.        Thought Content: Thought content normal.     Labs reviewed: Basic Metabolic Panel: Recent Labs    07/17/17 0138  07/21/17 0528 07/22/17 0520 10/02/17 1000 03/30/18 1456  NA 130*   < > 131* 130* 136 135  K 4.4   < > 4.2 4.6 4.2 4.4  CL 98*   < > 99* 98* 102 99  CO2 23   < > 23 24 27 27   GLUCOSE 110*   < > 119* 112* 105* 99  BUN 15   < > 18 16 12 18   CREATININE 0.59   < > 0.59 0.60 0.70 0.77  CALCIUM 8.2*   < > 9.3 9.1  9.7 10.6*  MG 2.1  --  1.9  --   --   --   TSH  --   --   --   --  0.72  --    < > = values in this interval not displayed.   Liver Function Tests: Recent Labs  07/18/17 0014 07/21/17 0528 07/22/17 0520 10/02/17 1000 03/30/18 1456  AST 30 18 25 15 19   ALT 43 24 31 12 16   ALKPHOS [place] 58 55  --   --   BILITOT 3.9* 2.4* 1.9* 0.9 1.5*  PROT 6.4* 6.3* 6.1* 6.8 7.4  ALBUMIN 3.7 3.4* 3.3*  --   --    No results for input(s): LIPASE, AMYLASE in the last 8760 hours. Recent Labs    07/16/17 0519  AMMONIA 31   CBC: Recent Labs    07/21/17 0528 07/22/17 0520 10/02/17 1000 03/30/18 1456  WBC 27.1* 21.2* 8.7 7.7  NEUTROABS 23.0*  --  6,012 5,552  HGB 10.4* 10.3* 13.5 14.9  HCT 31.2* 31.1* 41.7 45.8*  MCV 90.2 90.9 88.5 86.3  PLT 332 332 353 361   Lipid Panel: Recent Labs    10/02/17 1000  CHOL 185  HDL 71  LDLCALC 101*  TRIG 50  CHOLHDL 2.6   TSH: Recent Labs    10/02/17 1000  TSH 0.72   A1C: Lab Results  Component Value Date   HGBA1C 5.7 (H) 03/30/2018     Assessment/Plan 1. H/O mitral valve replacement with mechanical valve - POC INR 2.5 goal 2.5-3.5 - warfarin (COUMADIN) 4 MG tablet; Take po every evening as directed  Dispense: 30 tablet; Refill: 1  2. Paroxysmal atrial fibrillation (HCC) - POC INR 2.5 today, goal 2.5-3.5, rate controlled, will continue current coumadin 4 mg daily and metoprolol  - warfarin (COUMADIN) 4 MG tablet; Take po every evening as directed  Dispense: 30 tablet; Refill: 1  3. Essential hypertension, benign Improved on recheck to 132/80, to cont on metoprolol   4. Long term (current) use of anticoagulants INR 2.5 today, goal 2.5-3.5     Next appt: 4 weeks for INR Janelle Culton K. [place], [facility] [phone]

## 2018-06-29 ENCOUNTER — Encounter: Payer: Self-pay | Admitting: Nurse Practitioner

## 2018-06-29 ENCOUNTER — Ambulatory Visit (INDEPENDENT_AMBULATORY_CARE_PROVIDER_SITE_OTHER): Payer: Medicare Other | Admitting: Nurse Practitioner

## 2018-06-29 VITALS — BP 136/78 | HR 76 | Temp 97.6°F | Ht 63.0 in | Wt 164.0 lb

## 2018-06-29 DIAGNOSIS — R42 Dizziness and giddiness: Secondary | ICD-10-CM | POA: Diagnosis not present

## 2018-06-29 DIAGNOSIS — I480 Paroxysmal atrial fibrillation: Secondary | ICD-10-CM | POA: Diagnosis not present

## 2018-06-29 DIAGNOSIS — I10 Essential (primary) hypertension: Secondary | ICD-10-CM

## 2018-06-29 DIAGNOSIS — Z952 Presence of prosthetic heart valve: Secondary | ICD-10-CM | POA: Diagnosis not present

## 2018-06-29 LAB — POCT INR: INR: 2.3 (ref 2.0–3.0)

## 2018-06-29 MED ORDER — WARFARIN SODIUM 4 MG PO TABS: ORAL_TABLET | ORAL | 1 refills | Status: DC

## 2018-06-29 MED ORDER — METOPROLOL SUCCINATE ER 25 MG PO TB24: 25.0000 mg | ORAL_TABLET | Freq: Every day | ORAL | Status: DC

## 2018-06-29 NOTE — Patient Instructions (Signed)
Decrease metoprolol to 25 mg daily To check blood pressure and HR at home (after you have taken your medication and record-- bring to next office visit)  To increase coumadin to 4 mg (1 tablet) daily except 6 mg (1.5 tablets) on Tuesdays and Fridays   To follow up in 10 days- Next Friday

## 2018-06-29 NOTE — Progress Notes (Signed)
Careteam: Patient Care Team: Jodi Chandler, NP as PCP - General (Geriatric Medicine)  Advanced Directive information    Allergies  Allergen Reactions  . Carvedilol Itching  . Ace Inhibitors Other (See Comments)    Reaction (??)  . Amlodipine Swelling  . Hydrochlorothiazide Other (See Comments)    Hyponatremia     Chief Complaint  Patient presents with  . Anticoagulation    1 month INR check, 2.3, 4 mg daily of warfarin.  . Medication Management    Metoprolol makes patient dizzy      HPI: Patient is a 78 y.o. 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 Takes coumadin. INR GOAL 2.5-3.5due to mechanical valve.2D echo revealed EF 65-70% in Dec 2018.INR 2.5 on last check and todays INR 2.3   No changes in medication. No missed doses.  No abnormal bruising or bleeding. No blood in stool or urine.  Continues to complain of dizziness, uses cane when outside of the house but does not require at home.  No associated room spinning. No HA, sinus/ear pressure/pain, visual changes. No hearing loss. She has a hx dizziness. Pt reports dizziness got significantly worse when she restarted metoprolol. Does not take bp at home but does have a cuff.    Review of Systems:  Review of Systems  Constitutional: Negative for chills, fever and malaise/fatigue.  HENT: Negative for congestion, ear pain and nosebleeds.   Eyes: Negative for blurred vision and pain.  Respiratory: Negative for cough, hemoptysis, shortness of breath and wheezing.   Cardiovascular: Negative for chest pain, palpitations, orthopnea and leg swelling.  Gastrointestinal: Negative for abdominal pain, blood in stool, constipation, diarrhea, heartburn, nausea and vomiting.  Genitourinary: Negative for flank pain and hematuria.  Musculoskeletal: Negative for back pain and myalgias.  Skin: Negative for itching and rash.  Neurological: Positive for  dizziness. Negative for sensory change, speech change, weakness and headaches.  Endo/Heme/Allergies: Bruises/bleeds easily.  Psychiatric/Behavioral: Positive for memory loss.    Past Medical History:  Diagnosis Date  . Broken hip ([place]) 10/2011   left  . Broken wrist   . Depression   . Diarrhea   . H/O long-term (current) use of anticoagulants   . High cholesterol   . Hypertension   . Memory difficulty 01/18/2016  . 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 Osteoporosis   . SIADH (syndrome of inappropriate ADH production) ([place])   . Urinary retention    chronic  . Vertigo 01/18/2016   Past Surgical History:  Procedure Laterality Date  . HEMATOMA EVACUATION Left 11/25/2012   Procedure: EVACUATION HEMATOMA;  Surgeon: Linna Hoff, MD;  Location: [place];  Service: Orthopedics;  Laterality: Left;  . HIP PINNING,CANNULATED Left 11/03/2012   Procedure: CANNULATED HIP PINNING;  Surgeon: Tobi Bastos, MD;  Location: [facility];  Service: Orthopedics;  Laterality: Left;  . IR INJECT/THERA/INC NEEDLE/CATH/PLC EPI/LUMB/SAC W/IMG  07/20/2017  . 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  . OPEN REDUCTION INTERNAL FIXATION (ORIF) WRIST WITH ILIAC CREST BONE GRAFT Left 11/24/2012   Procedure: OPEN REDUCTION INTERNAL FIXATION LEFT WRIST POSSIBLE BONE GRAFTING AND PINNING (ORIF) ;  Surgeon: Linna Hoff, MD;  Location: [place];  Service: Orthopedics;  Laterality: Left;  . SHOULDER OPEN ROTATOR CUFF REPAIR     Social History:   reports that she has never smoked. She has never used smokeless tobacco. She  reports that she does not drink alcohol or use drugs.  Family History  Problem Relation Age of Onset  . Stroke Mother   . Hypertension Mother   . Stroke Father   . Colon cancer Paternal Grandmother   . Parkinsonism Brother     Medications: Patient's Medications  New Prescriptions   No medications on file  Previous Medications   ALENDRONATE (FOSAMAX) 70 MG TABLET    Take 1 tablet  (70 mg total) by mouth every Sunday. Take with a full glass of water on an empty stomach.   ASPIRIN EC 81 MG TABLET    Take 1 tablet (81 mg total) by mouth daily.   ATORVASTATIN (LIPITOR) 10 MG TABLET    TAKE ONE TABLET BY MOUTH AT BEDTIME   OMEGA 3 1200 MG CAPS    Take 1 capsule by mouth 2 (two) times daily.   TRAZODONE (DESYREL) 50 MG TABLET    Take 50 mg by mouth at bedtime as needed.   Modified Medications   Modified Medication Previous Medication   METOPROLOL SUCCINATE (TOPROL-XL) 25 MG 24 HR TABLET metoprolol succinate (TOPROL-XL) 50 MG 24 hr tablet      Take 1 tablet (25 mg total) by mouth daily.    Take 1 tablet (50 mg total) by mouth daily.   WARFARIN (COUMADIN) 4 MG TABLET warfarin (COUMADIN) 4 MG tablet      Take 1 tablet (4 mg) by mouth daily except Tuesdays and Fridays take 1.5 tablet (6 mg) by mouth daily    Take po every evening as directed  Discontinued Medications   No medications on file     Physical Exam:  Vitals:   06/29/18 1513  BP: 136/78  Pulse: 76  Temp: 97.6 F (36.4 C)  TempSrc: Oral  SpO2: 97%  Weight: 164 lb (74.4 kg)  Height: 5\' 3"  (1.6 m)   Body mass index is 29.05 kg/m.  Physical Exam Constitutional:      General: She is not in acute distress.    Appearance: Normal appearance. She is normal weight. She is not ill-appearing.  HENT:     Head: Normocephalic and atraumatic.     Nose: No congestion or rhinorrhea.  Eyes:     Extraocular Movements: Extraocular movements intact.     Pupils: Pupils are equal, round, and reactive to light.  Neck:     Musculoskeletal: Normal range of motion.  Cardiovascular:     Rate and Rhythm: Rhythm irregular.     Pulses: Normal pulses.     Heart sounds: No friction rub. No gallop.   Pulmonary:     Effort: Pulmonary effort is normal. No respiratory distress.     Breath sounds: Normal breath sounds. No wheezing or rhonchi.  Chest:     Chest wall: No tenderness.  Abdominal:     General: Bowel sounds are  normal. There is no distension.     Palpations: Abdomen is soft.     Tenderness: There is no abdominal tenderness.  Skin:    General: Skin is warm and dry.  Neurological:     Mental Status: She is alert and oriented to person, place, and time.  Psychiatric:        Mood and Affect: Mood normal.        Behavior: Behavior normal.        Thought Content: Thought content normal.        Cognition and Memory: Memory is impaired.        Judgment:  Judgment is not impulsive or inappropriate.     Labs reviewed: Basic Metabolic Panel: Recent Labs    07/17/17 0138  07/21/17 0528 07/22/17 0520 10/02/17 1000 03/30/18 1456  NA 130*   < > 131* 130* 136 135  K 4.4   < > 4.2 4.6 4.2 4.4  CL 98*   < > 99* 98* 102 99  CO2 23   < > 23 24 27 27   GLUCOSE 110*   < > 119* 112* 105* 99  BUN 15   < > 18 16 12 18   CREATININE 0.59   < > 0.59 0.60 0.70 0.77  CALCIUM 8.2*   < > 9.3 9.1 9.7 10.6*  MG 2.1  --  1.9  --   --   --   TSH  --   --   --   --  0.72  --    < > = values in this interval not displayed.   Liver Function Tests: Recent Labs    07/18/17 0014 07/21/17 0528 07/22/17 0520 10/02/17 1000 03/30/18 1456  AST 30 18 25 15 19   ALT 43 24 31 12 16   ALKPHOS 62 58 55  --   --   BILITOT 3.9* 2.4* 1.9* 0.9 1.5*  PROT 6.4* 6.3* 6.1* 6.8 7.4  ALBUMIN 3.7 3.4* 3.3*  --   --    No results for input(s): LIPASE, AMYLASE in the last 8760 hours. Recent Labs    07/16/17 0519  AMMONIA 31   CBC: Recent Labs    07/21/17 0528 07/22/17 0520 10/02/17 1000 03/30/18 1456  WBC 27.1* 21.2* 8.7 7.7  NEUTROABS 23.0*  --  6,012 5,552  HGB 10.4* 10.3* 13.5 14.9  HCT 31.2* 31.1* 41.7 45.8*  MCV 90.2 90.9 88.5 86.3  PLT 332 332 353 361   Lipid Panel: Recent Labs    10/02/17 1000  CHOL 185  HDL 71  LDLCALC 101*  TRIG 50  CHOLHDL 2.6   TSH: Recent Labs    10/02/17 1000  TSH 0.72   A1C: Lab Results  Component Value Date   HGBA1C 5.7 (H) 03/30/2018     Assessment/Plan 1.  Paroxysmal atrial fibrillation (HCC) INR today is 2.3 , increasing the dose of coumadin on Tuesdays and Fridays up to 6 mg to reach a desired goal (2.5-3.5) to continue 4 mg on all other days.  Rate has been controlled on metoprolol  - POC INR - warfarin (COUMADIN) 4 MG tablet; Take 1 tablet (4 mg) by mouth daily except Tuesdays and Fridays take 1.5 tablet (6 mg) by mouth daily  Dispense: 30 tablet; Refill: 1  3. Dizziness Ongoing dizziness. Pt feels it related to Metoprolol. Metoprolol dose reduced, pt's dizziness is going to be reassessed during next office visit.  4. Essential hypertension, benign Stable BP and HR , however pt reports dizziness as side effect of medication. Going to reduce dose to 25 mg a day and reassess her BP and HR during next appointment 07/09/2018 - metoprolol succinate (TOPROL-XL) 25 MG 24 hr tablet; Take 1 tablet (25 mg total) by mouth daily.  5. H/O mitral valve replacement with mechanical valve INR 2.3 today, coumadin increased to 6 mg on Tuesdays and Fridays. Pt scheduled to come back or INR check in 10 days. - warfarin (COUMADIN) 4 MG tablet; Take 1 tablet (4 mg) by mouth daily except Tuesdays and Fridays take 1.5 tablet (6 mg) by mouth daily  Dispense: 30 tablet; Refill: 1  Next appt: 07/09/2018  Carlos American. [place], [facility] [phone]

## 2018-07-09 ENCOUNTER — Ambulatory Visit (INDEPENDENT_AMBULATORY_CARE_PROVIDER_SITE_OTHER): Payer: Medicare Other | Admitting: Family

## 2018-07-09 ENCOUNTER — Encounter: Payer: Self-pay | Admitting: Family

## 2018-07-09 VITALS — BP 180/90 | HR 69 | Temp 97.5°F | Ht 63.0 in | Wt 162.0 lb

## 2018-07-09 DIAGNOSIS — Z7901 Long term (current) use of anticoagulants: Secondary | ICD-10-CM | POA: Diagnosis not present

## 2018-07-09 DIAGNOSIS — I10 Essential (primary) hypertension: Secondary | ICD-10-CM

## 2018-07-09 DIAGNOSIS — I480 Paroxysmal atrial fibrillation: Secondary | ICD-10-CM | POA: Diagnosis not present

## 2018-07-09 LAB — POCT INR: INR: 3.4 — AB (ref 2.0–3.0)

## 2018-07-09 MED ORDER — METOPROLOL SUCCINATE ER 25 MG PO TB24: 12.5000 mg | ORAL_TABLET | Freq: Every day | ORAL | Status: DC

## 2018-07-09 MED ORDER — CLONIDINE HCL 0.1 MG PO TABS
0.2000 mg | ORAL_TABLET | Freq: Once | ORAL | Status: AC
  Administered 2018-07-09: 0.2 mg via ORAL

## 2018-07-09 MED ORDER — SPIRONOLACTONE 25 MG PO TABS: 12.5000 mg | ORAL_TABLET | Freq: Every day | ORAL | 0 refills | Status: DC

## 2018-07-09 NOTE — Progress Notes (Signed)
Provider:   FNP-C  [name], NP  Patient Care Team: [name], NP as PCP - General (Geriatric Medicine)  Extended Emergency Contact Information Primary Emergency Contact: Keach,Pam Address: [place] [address] Phone: [phone] Mobile Phone: [phone] Relation: Daughter Secondary Emergency Contact: Pilar Plate [place] Phone: [phone] Mobile Phone: [phone] Relation: Other  Goals of care: Advanced Directive information Advanced Directives 03/30/2018  Does Patient Have a Medical Advance Directive? Yes  Type of Paramedic of [place];Living will  Does patient want to make changes to medical advance directive? -  Copy of [place] in Chart? Yes - validated most recent copy scanned in chart (See row information)  Would patient like information on creating a medical advance directive? -  Pre-existing out of facility DNR order (yellow form or pink MOST form) -     Chief Complaint  Patient presents with  . Anticoagulation    10 day PT/INR check, 3.4   . FYI    Patient's b/p cuff 216/139    HPI:  Pt is a 78 y.o. 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  She continues to complain of  Dizziness states has to use a cane to prevent her from falling.she denies any room spinning.she thinks dizziness comes from her metoprolol symptoms and would like metoprolol discontinued.She states symptoms started with taking metoprolol.  Hypertension - Her B/P today elevated 220/110 she states her blood pressure stay  high all the time for several years.He B/p log readings ranging in the 160's/110's- 200's/110's and HR 70's-80's.she states has had dizziness but attributes it to metoprolol.denies any headache,N/V,faintness,chest pain or shortness of breath.    Past Medical History:  Diagnosis Date  . Broken hip ([place]) 10/2011   left  . Broken wrist   . Depression   . Diarrhea   . H/O long-term (current) use of anticoagulants   . High cholesterol   . Hypertension   . Memory difficulty 01/18/2016  . 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 Osteoporosis   . SIADH (syndrome of inappropriate ADH production) ([place])   . Urinary retention    chronic  . Vertigo 01/18/2016   Past Surgical History:  Procedure Laterality Date  . HEMATOMA EVACUATION Left 11/25/2012   Procedure: EVACUATION HEMATOMA;  Surgeon: Linna Hoff, MD;  Location: [facility];  Service: Orthopedics;  Laterality: Left;  . HIP PINNING,CANNULATED Left 11/03/2012   Procedure: CANNULATED HIP PINNING;  Surgeon: Tobi Bastos, MD;  Location: [facility];  Service: Orthopedics;  Laterality: Left;  . IR INJECT/THERA/INC NEEDLE/CATH/PLC EPI/LUMB/SAC W/IMG  07/20/2017  . 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  . OPEN REDUCTION INTERNAL FIXATION (ORIF) WRIST WITH ILIAC CREST BONE GRAFT Left 11/24/2012   Procedure: OPEN REDUCTION INTERNAL FIXATION LEFT WRIST POSSIBLE BONE GRAFTING AND PINNING (ORIF) ;  Surgeon: Linna Hoff, MD;  Location: [facility];  Service: Orthopedics;  Laterality: Left;  . SHOULDER OPEN ROTATOR CUFF REPAIR      Allergies  Allergen Reactions  .  Carvedilol Itching  . Ace Inhibitors Other (See Comments)    Reaction (??)  . Amlodipine Swelling  . Hydrochlorothiazide Other (See Comments)    Hyponatremia     Outpatient Encounter Medications as of 07/09/2018  Medication Sig  . alendronate (FOSAMAX) 70 MG tablet Take 1 tablet (70 mg total) by mouth every Sunday. Take with a full glass of water on an empty stomach.  Marland Kitchen aspirin  EC 81 MG tablet Take 1 tablet (81 mg total) by mouth daily.  Marland Kitchen atorvastatin (LIPITOR) 10 MG tablet TAKE ONE TABLET BY MOUTH AT BEDTIME  . metoprolol succinate (TOPROL-XL) 25 MG 24 hr tablet Take 0.5 tablets (12.5 mg total) by mouth daily.  . Omega 3 1200 MG CAPS Take 1 capsule by mouth 2 (two) times daily.  . traZODone (DESYREL) 50 MG tablet Take 50 mg by mouth at bedtime as needed.   . warfarin (COUMADIN) 4 MG tablet Take 1 tablet (4 mg) by mouth daily except Tuesdays and Fridays take 1.5 tablet (6 mg) by mouth daily  . [DISCONTINUED] metoprolol succinate (TOPROL-XL) 25 MG 24 hr tablet Take 1 tablet (25 mg total) by mouth daily.  Marland Kitchen spironolactone (ALDACTONE) 25 MG tablet Take 0.5 tablets (12.5 mg total) by mouth daily for 30 days.  . [EXPIRED] cloNIDine (CATAPRES) tablet 0.2 mg    No facility-administered encounter medications on file as of 07/09/2018.     Review of Systems  Constitutional: Negative for appetite change, chills, fatigue and fever.  HENT: Negative for congestion, postnasal drip, rhinorrhea, sinus pressure, sinus pain, sneezing and sore throat.   Eyes: Negative for photophobia, pain, discharge, redness and itching.  Respiratory: Negative for cough, chest tightness, shortness of breath and wheezing.   Cardiovascular: Negative for chest pain, palpitations and leg swelling.  Gastrointestinal: Negative for abdominal distention, abdominal pain, constipation, diarrhea, nausea and vomiting.  Musculoskeletal: Negative for arthralgias and back pain.  Skin: Negative for color change, pallor and rash.  Neurological: Positive for dizziness. Negative for light-headedness and headaches.  Hematological: Bruises/bleeds easily.  Psychiatric/Behavioral: Negative for agitation and sleep disturbance. The patient is not nervous/anxious.     Immunization History  Administered Date(s) Administered  . Influenza, High Dose Seasonal PF 02/17/2014, 02/14/2015  . Influenza, Seasonal, Injecte,  Preservative Fre 02/14/2016  . Influenza,inj,Quad PF,6+ Mos 02/10/2018  . Influenza-Unspecified 02/23/2013, 02/26/2017  . Pneumococcal Conjugate-13 08/27/2013  . Pneumococcal Polysaccharide-23 05/03/2007, 09/26/2009, 02/17/2014  . Td 06/26/2005, 07/09/2005  . Tetanus 06/26/2005, 07/09/2005  . Zoster 08/19/2013   Pertinent  Health Maintenance Due  Topic Date Due  . COLONOSCOPY  03/31/2019 (Originally 09/20/2015)  . INFLUENZA VACCINE  Completed  . DEXA SCAN  Completed  . PNA vac Low Risk Adult  Completed   Fall Risk  06/29/2018 06/02/2018 04/29/2018 03/30/2018 03/02/2018  Falls in the past year? 0 0 0 0 No  Number falls in past yr: 0 0 0 - -  Injury with Fall? 0 0 0 - -    Vitals:   07/09/18 1314 07/09/18 1411 07/09/18 1416  BP: (!) 220/110 (!) 180/90 (!) 180/90  Pulse: 69    Temp: (!) 97.5 F (36.4 C)    TempSrc: Oral    SpO2: 97%    Weight: 162 lb (73.5 kg)    Height: 5\' 3"  (1.6 m)     Body mass index is 28.7 kg/m. Physical Exam Constitutional:      General: She is not in acute distress.    Appearance: She is overweight.  HENT:  Head: Normocephalic.     Right Ear: Tympanic membrane, ear canal and external ear normal. There is no impacted cerumen.     Left Ear: Tympanic membrane, ear canal and external ear normal. There is no impacted cerumen.     Nose: Nose normal. No congestion or rhinorrhea.     Mouth/Throat:     Mouth: Mucous membranes are moist.     Pharynx: Oropharynx is clear. No oropharyngeal exudate or posterior oropharyngeal erythema.  Eyes:     General: No scleral icterus.       Right eye: No discharge.        Left eye: No discharge.     Conjunctiva/sclera: Conjunctivae normal.     Pupils: Pupils are equal, round, and reactive to light.  Neck:     Musculoskeletal: Normal range of motion. No muscular tenderness.     Vascular: No carotid bruit.  Cardiovascular:     Rate and Rhythm: Rhythm irregular.     Heart sounds: Murmur present. Systolic murmur  present. No friction rub. No gallop.   Pulmonary:     Effort: Pulmonary effort is normal. No respiratory distress.     Breath sounds: Normal breath sounds. No wheezing, rhonchi or rales.  Chest:     Chest wall: No tenderness.  Abdominal:     General: Bowel sounds are normal. There is no distension.     Palpations: Abdomen is soft. There is no mass.     Tenderness: There is no abdominal tenderness. There is no right CVA tenderness, left CVA tenderness, guarding or rebound.  Musculoskeletal: Normal range of motion.        General: No tenderness.     Right lower leg: No edema.     Left lower leg: No edema.  Lymphadenopathy:     Cervical: No cervical adenopathy.  Skin:    General: Skin is warm and dry.     Coloration: Skin is not pale.     Findings: No erythema or rash.  Neurological:     Mental Status: She is alert and oriented to person, place, and time.     Gait: Gait normal.  Psychiatric:        Mood and Affect: Mood normal.        Behavior: Behavior normal.        Thought Content: Thought content normal.        Judgment: Judgment normal.    Labs reviewed: Recent Labs    07/17/17 0138  07/21/17 0528 07/22/17 0520 10/02/17 1000 03/30/18 1456  NA 130*   < > 131* 130* 136 135  K 4.4   < > 4.2 4.6 4.2 4.4  CL 98*   < > 99* 98* 102 99  CO2 23   < > 23 24 27 27   GLUCOSE 110*   < > 119* 112* 105* 99  BUN 15   < > 18 16 12 18   CREATININE 0.59   < > 0.59 0.60 0.70 0.77  CALCIUM 8.2*   < > 9.3 9.1 9.7 10.6*  MG 2.1  --  1.9  --   --   --    < > = values in this interval not displayed.   Recent Labs    07/18/17 0014 07/21/17 0528 07/22/17 0520 10/02/17 1000 03/30/18 1456  AST 30 18 25 15 19   ALT 43 24 31 12 16   ALKPHOS 62 58 55  --   --   BILITOT 3.9* 2.4* 1.9* 0.9 1.5*  PROT  6.4* 6.3* 6.1* 6.8 7.4  ALBUMIN 3.7 3.4* 3.3*  --   --    Recent Labs    07/21/17 0528 07/22/17 0520 10/02/17 1000 03/30/18 1456  WBC 27.1* 21.2* 8.7 7.7  NEUTROABS 23.0*  --  6,012  5,552  HGB 10.4* 10.3* 13.5 14.9  HCT 31.2* 31.1* 41.7 45.8*  MCV 90.2 90.9 88.5 86.3  PLT 332 332 353 361   Lab Results  Component Value Date   TSH 0.72 10/02/2017   Lab Results  Component Value Date   HGBA1C 5.7 (H) 03/30/2018   Lab Results  Component Value Date   CHOL 185 10/02/2017   HDL 71 10/02/2017   LDLCALC 101 (H) 10/02/2017   TRIG 50 10/02/2017   CHOLHDL 2.6 10/02/2017    Significant Diagnostic Results in last 30 days:  No results found.  Assessment/Plan  1. Paroxysmal atrial fibrillation (HCC)  HR controlled.INR today was 3.4 previous INR was 2.3 continue on coumadin 4 mg tablet on Saturday,sunday,monday,wednesday and Thurday and 6 mg tablet on Tuesday and Friday  - Recheck INR in 4 weeks. - notify provider's office for any signs of bleeding - POC INR  2. Essential hypertension, benign B/p elevated today and home log.CloNIDine (CATAPRES) tablet 0.2 mg x 2 dose administered.Patient refused wait longer for B/P to be rechecked after 15 minutes.B/p went down to 180/90.  - Decreased metoprolol to 12.5 mg tablet instead of discontinuing metoprolol succinate (TOPROL-XL) 25 MG 24 hr tablet; Take 0.5 tablets (12.5 mg total) by mouth daily. - start Aldactone 12.5 mg tablet one by mouth daily. - patient allergic to multiple blood pressure medication. - follow up with Cardiology for chronic high blood pressure and dizziness.    3. Long term (current) use of anticoagulants Continue on on coumadin 4 mg tablet on Saturday,sunday,monday,wednesday and Thurday and 6 mg tablet on Tuesday and Friday  - Recheck INR in 4 weeks. - POC INR  Family/ staff Communication: Reviewed plan of care with patient.   Labs/tests ordered: None    C , NP

## 2018-07-09 NOTE — Patient Instructions (Signed)
1. Reduce metoprolol to 12.5 mg tablet one by mouth daily 2. Start Aldactone 12.5 mg tablet one by mouth daily 3. Follow up with cardiology for dizziness  4. Notify provider's office for B/P > 150/90 or < 100/60

## 2018-07-14 ENCOUNTER — Encounter: Payer: Self-pay | Admitting: Nurse Practitioner

## 2018-07-14 DIAGNOSIS — Z952 Presence of prosthetic heart valve: Secondary | ICD-10-CM

## 2018-07-14 DIAGNOSIS — I480 Paroxysmal atrial fibrillation: Secondary | ICD-10-CM

## 2018-07-14 MED ORDER — WARFARIN SODIUM 4 MG PO TABS: ORAL_TABLET | ORAL | 1 refills | Status: DC

## 2018-07-14 NOTE — Addendum Note (Signed)
Addended by: Logan Bores on: 07/14/2018 11:28 AM   Modules accepted: Orders

## 2018-07-20 ENCOUNTER — Ambulatory Visit (INDEPENDENT_AMBULATORY_CARE_PROVIDER_SITE_OTHER): Payer: Medicare Other | Admitting: Nurse Practitioner

## 2018-07-20 VITALS — BP 160/94 | HR 78 | Temp 97.8°F | Resp 10 | Ht 63.0 in | Wt 165.0 lb

## 2018-07-20 DIAGNOSIS — Z7901 Long term (current) use of anticoagulants: Secondary | ICD-10-CM

## 2018-07-20 DIAGNOSIS — M5489 Other dorsalgia: Secondary | ICD-10-CM

## 2018-07-20 DIAGNOSIS — I480 Paroxysmal atrial fibrillation: Secondary | ICD-10-CM

## 2018-07-20 DIAGNOSIS — I10 Essential (primary) hypertension: Secondary | ICD-10-CM

## 2018-07-20 DIAGNOSIS — R42 Dizziness and giddiness: Secondary | ICD-10-CM | POA: Diagnosis not present

## 2018-07-20 MED ORDER — HYDRALAZINE HCL 10 MG PO TABS: 10.0000 mg | ORAL_TABLET | Freq: Two times a day (BID) | ORAL | 1 refills | Status: DC

## 2018-07-20 MED ORDER — PREDNISONE 20 MG PO TABS: 20.0000 mg | ORAL_TABLET | Freq: Two times a day (BID) | ORAL | 0 refills | Status: DC

## 2018-07-20 NOTE — Patient Instructions (Signed)
To start prednisone twice daily for 3 days.  To use heat to back 3 times daily with muscle rub AFTER heat (aspercream, benegay, biofreeze) To notify if symptoms worsen or fail to improve Can use tylenol 1000 mg by mouth every 8 hours as needed Avoid narcotics at this time To not carry heavy objects (purse) on that shoulder  DASH Eating Plan DASH stands for "Dietary Approaches to Stop Hypertension." The DASH eating plan is a healthy eating plan that has been shown to reduce high blood pressure (hypertension). It may also reduce your risk for type 2 diabetes, heart disease, and stroke. The DASH eating plan may also help with weight loss. What are tips for following this plan?  General guidelines  Avoid eating more than 2,300 mg (milligrams) of salt (sodium) a day. If you have hypertension, you may need to reduce your sodium intake to 1,500 mg a day.  Limit alcohol intake to no more than 1 drink a day for nonpregnant women and 2 drinks a day for men. One drink equals 12 oz of beer, 5 oz of wine, or 1 oz of hard liquor.  Work with your health care provider to maintain a healthy body weight or to lose weight. Ask what an ideal weight is for you.  Get at least 30 minutes of exercise that causes your heart to beat faster (aerobic exercise) most days of the week. Activities may include walking, swimming, or biking.  Work with your health care provider or diet and nutrition specialist (dietitian) to adjust your eating plan to your individual calorie needs. Reading food labels   Check food labels for the amount of sodium per serving. Choose foods with less than 5 percent of the Daily Value of sodium. Generally, foods with less than 300 mg of sodium per serving fit into this eating plan.  To find whole grains, look for the word "whole" as the first word in the ingredient list. Shopping  Buy products labeled as "low-sodium" or "no salt added."  Buy fresh foods. Avoid canned foods and premade or  frozen meals. Cooking  Avoid adding salt when cooking. Use salt-free seasonings or herbs instead of table salt or sea salt. Check with your health care provider or pharmacist before using salt substitutes.  Do not fry foods. Cook foods using healthy methods such as baking, boiling, grilling, and broiling instead.  Cook with heart-healthy oils, such as olive, canola, soybean, or sunflower oil. Meal planning  Eat a balanced diet that includes: ? 5 or more servings of fruits and vegetables each day. At each meal, try to fill half of your plate with fruits and vegetables. ? Up to 6-8 servings of whole grains each day. ? Less than 6 oz of lean meat, poultry, or fish each day. A 3-oz serving of meat is about the same size as a deck of cards. One egg equals 1 oz. ? 2 servings of low-fat dairy each day. ? A serving of nuts, seeds, or beans 5 times each week. ? Heart-healthy fats. Healthy fats called Omega-3 fatty acids are found in foods such as flaxseeds and coldwater fish, like sardines, salmon, and mackerel.  Limit how much you eat of the following: ? Canned or prepackaged foods. ? Food that is high in trans fat, such as fried foods. ? Food that is high in saturated fat, such as fatty meat. ? Sweets, desserts, sugary drinks, and other foods with added sugar. ? Full-fat dairy products.  Do not salt foods before eating.  Try to eat at least 2 vegetarian meals each week.  Eat more home-cooked food and less restaurant, buffet, and fast food.  When eating at a restaurant, ask that your food be prepared with less salt or no salt, if possible. What foods are recommended? The items listed may not be a complete list. Talk with your dietitian about what dietary choices are best for you. Grains Whole-grain or whole-wheat bread. Whole-grain or whole-wheat pasta. Brown rice. [name]. Bulgur. Whole-grain and low-sodium cereals. Pita bread. Low-fat, low-sodium crackers. Whole-wheat flour  tortillas. Vegetables Fresh or frozen vegetables (raw, steamed, roasted, or grilled). Low-sodium or reduced-sodium tomato and vegetable juice. Low-sodium or reduced-sodium tomato sauce and tomato paste. Low-sodium or reduced-sodium canned vegetables. Fruits All fresh, dried, or frozen fruit. Canned fruit in natural juice (without added sugar). Meat and other protein foods Skinless chicken or [place]. Ground chicken or [place]. Pork with fat trimmed off. Fish and seafood. Egg whites. Dried beans, peas, or lentils. Unsalted nuts, nut butters, and seeds. Unsalted canned beans. Lean cuts of beef with fat trimmed off. Low-sodium, lean deli meat. Dairy Low-fat (1%) or fat-free (skim) milk. Fat-free, low-fat, or reduced-fat cheeses. Nonfat, low-sodium ricotta or cottage cheese. Low-fat or nonfat yogurt. Low-fat, low-sodium cheese. Fats and oils Soft margarine without trans fats. Vegetable oil. Low-fat, reduced-fat, or light mayonnaise and salad dressings (reduced-sodium). Canola, safflower, olive, soybean, and sunflower oils. Avocado. Seasoning and other foods Herbs. Spices. Seasoning mixes without salt. Unsalted popcorn and pretzels. Fat-free sweets. What foods are not recommended? The items listed may not be a complete list. Talk with your dietitian about what dietary choices are best for you. Grains Baked goods made with fat, such as croissants, muffins, or some breads. Dry pasta or rice meal packs. Vegetables Creamed or fried vegetables. Vegetables in a cheese sauce. Regular canned vegetables (not low-sodium or reduced-sodium). Regular canned tomato sauce and paste (not low-sodium or reduced-sodium). Regular tomato and vegetable juice (not low-sodium or reduced-sodium). [name]. Olives. Fruits Canned fruit in a light or heavy syrup. Fried fruit. Fruit in cream or butter sauce. Meat and other protein foods Fatty cuts of meat. Ribs. Fried meat. [name]. Sausage. Bologna and other processed lunch meats.  Salami. Fatback. Hotdogs. Bratwurst. Salted nuts and seeds. Canned beans with added salt. Canned or smoked fish. Whole eggs or egg yolks. Chicken or [place] with skin. Dairy Whole or 2% milk, cream, and half-and-half. Whole or full-fat cream cheese. Whole-fat or sweetened yogurt. Full-fat cheese. Nondairy creamers. Whipped toppings. Processed cheese and cheese spreads. Fats and oils Butter. Stick margarine. Lard. Shortening. Ghee. Bacon fat. Tropical oils, such as coconut, palm kernel, or palm oil. Seasoning and other foods Salted popcorn and pretzels. Onion salt, garlic salt, seasoned salt, table salt, and sea salt. Worcestershire sauce. Tartar sauce. Barbecue sauce. Teriyaki sauce. Soy sauce, including reduced-sodium. Steak sauce. Canned and packaged gravies. Fish sauce. Oyster sauce. Cocktail sauce. Horseradish that you find on the shelf. Ketchup. Mustard. Meat flavorings and tenderizers. Bouillon cubes. Hot sauce and Tabasco sauce. Premade or packaged marinades. Premade or packaged taco seasonings. Relishes. Regular salad dressings. Where to find more information:  National Heart, Lung, and [place]: https://wilson-eaton.com/  American Heart Association: www.heart.org Summary  The DASH eating plan is a healthy eating plan that has been shown to reduce high blood pressure (hypertension). It may also reduce your risk for type 2 diabetes, heart disease, and stroke.  With the DASH eating plan, you should limit salt (sodium) intake to 2,300 mg a day. If  you have hypertension, you may need to reduce your sodium intake to 1,500 mg a day.  When on the DASH eating plan, aim to eat more fresh fruits and vegetables, whole grains, lean proteins, low-fat dairy, and heart-healthy fats.  Work with your health care provider or diet and nutrition specialist (dietitian) to adjust your eating plan to your individual calorie needs. This information is not intended to replace advice given to you by your health  care provider. Make sure you discuss any questions you have with your health care provider. Document Released: 05/01/2011 Document Revised: 05/05/2016 Document Reviewed: 05/05/2016 Elsevier Interactive Patient Education  2019 [employer].

## 2018-07-20 NOTE — Progress Notes (Signed)
Careteam: Patient Care Team: [name], NP as PCP - General (Geriatric Medicine)  Advanced Directive information    Allergies  Allergen Reactions  . Carvedilol Itching  . Ace Inhibitors Other (See Comments)    Reaction (??)  . Amlodipine Swelling  . Hydrochlorothiazide Other (See Comments)    Hyponatremia   . Metoprolol Other (See Comments)    Dizziness     Chief Complaint  Patient presents with  . Acute Visit    Right shoulder and back pain x 4-5 days   . Medication Management    Stopped metoprolol x 1 week due to dizziness   . Medication Management    Discuss Hydrocodone, patient has an old rx that she is taking for pain at bedtime     HPI: Patient is a 78 y.o. female seen in the office today for right shoulder pain  Right shoulder pain- Pt reports the pain started about a week ago. She describes pain as "achy" from top of shoulder down to mid back. Patient reports that proximal part of her right arm also aches. Patient denies any tingling or numbness in her arm and fingers. Patient states her pain 5/10. She tried to take tylenol for pain but it did not help at all. Patient found old prescription of hydrocodone and was taken half a pill at bedside.  Patient uses cane with her right hand and wears her purpose on the right shoulder. No rash or sores. No injury noted  Hypertension- Patient still has elevated blood pressure. She was seen in office on 07/09/17 and metoprolol was decreased and aldactone was added.  Patient stopped using Metoprolol all together and reports that her dizziness has slowly been improving for the past week. She still has dizziness  when she changes position from sitting to standing but less frequently. She states she will not go back on that medication.   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 Takes coumadin. INR GOAL 2.5-3.5 due to mechanical valve. Patient reports increased episodes of  nosebleeds for the past week. She couldn't say how many, but stated more than usual. She denies blood in stool, urine or any other source. States she has not missed or doubled doses. No changes in diet. Has had medication changes as above.    Review of Systems:  Review of Systems  Constitutional: Negative for chills, fever and malaise/fatigue.  HENT: Positive for nosebleeds. Negative for ear pain and sinus pain.   Eyes: Negative for double vision, pain and discharge.  Respiratory: Negative for cough, hemoptysis and wheezing.   Cardiovascular: Negative for chest pain and palpitations.  Gastrointestinal: Negative for abdominal pain, blood in stool, diarrhea, nausea and vomiting.  Genitourinary: Negative for flank pain and hematuria.  Musculoskeletal: Positive for back pain.       Right top side of the back  Skin: Negative for itching and rash.  Neurological: Positive for dizziness. Negative for tingling, sensory change, focal weakness and weakness.  Endo/Heme/Allergies: Bruises/bleeds easily.    Past Medical History:  Diagnosis Date  . Broken hip ([place]) 10/2011   left  . Broken wrist   . Depression   . Diarrhea   . H/O long-term (current) use of anticoagulants   . High cholesterol   . Hypertension   . Memory difficulty 01/18/2016  . 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 Osteoporosis   . SIADH (syndrome of inappropriate ADH production) ([place])   .  Urinary retention    chronic  . Vertigo 01/18/2016   Past Surgical History:  Procedure Laterality Date  . HEMATOMA EVACUATION Left 11/25/2012   Procedure: EVACUATION HEMATOMA;  Surgeon: Linna Hoff, MD;  Location: [facility];  Service: Orthopedics;  Laterality: Left;  . HIP PINNING,CANNULATED Left 11/03/2012   Procedure: CANNULATED HIP PINNING;  Surgeon: Tobi Bastos, MD;  Location: [facility];  Service: Orthopedics;  Laterality: Left;  . IR INJECT/THERA/INC NEEDLE/CATH/PLC EPI/LUMB/SAC W/IMG  07/20/2017  . MITRAL VALVE REPLACEMENT       Pt. is on coumadin.  . OPEN REDUCTION INTERNAL FIXATION (ORIF) WRIST WITH ILIAC CREST BONE GRAFT Left 11/24/2012   Procedure: OPEN REDUCTION INTERNAL FIXATION LEFT WRIST POSSIBLE BONE GRAFTING AND PINNING (ORIF) ;  Surgeon: Linna Hoff, MD;  Location: [facility];  Service: Orthopedics;  Laterality: Left;  . SHOULDER OPEN ROTATOR CUFF REPAIR     Social History:   reports that she has never smoked. She has never used smokeless tobacco. She reports that she does not drink alcohol or use drugs.  Family History  Problem Relation Age of Onset  . Stroke Mother   . Hypertension Mother   . Stroke Father   . Colon cancer Paternal Grandmother   . Parkinsonism Brother     Medications: Patient's Medications  New Prescriptions   HYDRALAZINE (APRESOLINE) 10 MG TABLET    Take 1 tablet (10 mg total) by mouth 2 (two) times daily.   PREDNISONE (DELTASONE) 20 MG TABLET    Take 1 tablet (20 mg total) by mouth 2 (two) times daily with a meal.  Previous Medications   ALENDRONATE (FOSAMAX) 70 MG TABLET    Take 1 tablet (70 mg total) by mouth every Sunday. Take with a full glass of water on an empty stomach.   ASPIRIN EC 81 MG TABLET    Take 1 tablet (81 mg total) by mouth daily.   ATORVASTATIN (LIPITOR) 10 MG TABLET    TAKE ONE TABLET BY MOUTH AT BEDTIME   HYDROCODONE-ACETAMINOPHEN (NORCO/VICODIN) 5-325 MG TABLET    Take 0.5 tablets by mouth at bedtime.    OMEGA 3 1200 MG CAPS    Take 1 capsule by mouth 2 (two) times daily.   SPIRONOLACTONE (ALDACTONE) 25 MG TABLET    Take 0.5 tablets (12.5 mg total) by mouth daily for 30 days.   TRAZODONE (DESYREL) 50 MG TABLET    Take 50 mg by mouth at bedtime as needed.    WARFARIN (COUMADIN) 4 MG TABLET    Take 1 tablet (4 mg) by mouth daily except Tuesdays and Fridays take 1.5 tablet (6 mg) by mouth daily  Modified Medications   No medications on file  Discontinued Medications   METOPROLOL SUCCINATE (TOPROL-XL) 25 MG 24 HR TABLET    Take 0.5 tablets (12.5 mg total) by  mouth daily.     Physical Exam:  Vitals:   07/20/18 1312  BP: (!) 160/94  Pulse: 78  Resp: 10  Temp: 97.8 F (36.6 C)  TempSrc: Oral  SpO2: 96%  Weight: 165 lb (74.8 kg)  Height: 5' 3"  (1.6 m)   Body mass index is 29.23 kg/m.  Physical Exam Vitals signs reviewed.  Constitutional:      General: She is not in acute distress.    Appearance: Normal appearance. She is normal weight. She is not ill-appearing.  HENT:     Head: Normocephalic and atraumatic.     Nose: Nose normal. No congestion.  Cardiovascular:  Rate and Rhythm: Rhythm irregular.     Pulses: Normal pulses.  Pulmonary:     Effort: Pulmonary effort is normal. No respiratory distress.     Breath sounds: Normal breath sounds. No wheezing or rhonchi.  Abdominal:     General: Bowel sounds are normal.     Palpations: Abdomen is soft.  Musculoskeletal:        General: Tenderness present. No signs of injury.       Back:     Comments: Tenderness   Skin:    General: Skin is warm and dry.     Findings: No bruising.  Neurological:     General: No focal deficit present.     Mental Status: She is alert and oriented to person, place, and time.     Motor: No weakness.     Labs reviewed: Basic Metabolic Panel: Recent Labs    10/02/17 1000 03/30/18 1456 07/20/18 1400  NA 136 135 134*  K 4.2 4.4 4.2  CL 102 99 100  CO2 27 27 23   GLUCOSE 105* 99 108  BUN 12 18 18   CREATININE 0.70 0.77 0.83  CALCIUM 9.7 10.6* 10.2  TSH 0.72  --   --    Liver Function Tests: Recent Labs    07/22/17 0520 10/02/17 1000 03/30/18 1456  AST 25 15 19   ALT 31 12 16   ALKPHOS 55  --   --   BILITOT 1.9* 0.9 1.5*  PROT 6.1* 6.8 7.4  ALBUMIN 3.3*  --   --    No results for input(s): LIPASE, AMYLASE in the last 8760 hours. No results for input(s): AMMONIA in the last 8760 hours. CBC: Recent Labs    07/22/17 0520 10/02/17 1000 03/30/18 1456  WBC 21.2* 8.7 7.7  NEUTROABS  --  6,012 5,552  HGB 10.3* 13.5 14.9  HCT  31.1* 41.7 45.8*  MCV 90.9 88.5 86.3  PLT 332 353 361   Lipid Panel: Recent Labs    10/02/17 1000  CHOL 185  HDL 71  LDLCALC 101*  TRIG 50  CHOLHDL 2.6   TSH: Recent Labs    10/02/17 1000  TSH 0.72   A1C: Lab Results  Component Value Date   HGBA1C 5.7 (H) 03/30/2018     Assessment/Plan 1. Essential hypertension, benign Blood pressure remains uncontrolled. Patient was started on Aldactone two weeks ago and she has stopped metoprolol -to start hydralazine 10 mg by mouth twice daily, dietary modifications encouraged - BMP with eGFR([facility]) - hydrALAZINE (APRESOLINE) 10 MG tablet; Take 1 tablet (10 mg total) by mouth 2 (two) times daily.  Dispense: 60 tablet; Refill: 1  2. Paroxysmal atrial fibrillation (HCC) Pt is on coumadin therapy, going to recheck her INR today due increased nosebleeds. - Protime-INR  3. Dizziness Pt reports slow improvement. Pt is educated to change her positions slowly and report back to clinic if it gets worse again.  4. Long term (current) use of anticoagulants Checking her INR today due to increased number of nose bleeds.  - Protime-INR  5. Pain in right paraspinal region use tylenol 500 mg 2 tablets every 8 hours as needed for pain relief -to use warm compress 3 times daily followed by  muscle rub.  - not to  use hydrocodone-apap  as it can worsen her dizziness and cause confusion. - predniSONE (DELTASONE) 20 MG tablet; Take 1 tablet (20 mg total) by mouth 2 (two) times daily with a meal.  Dispense: 6 tablet; Refill: 0  Next appt:  in 2 weeks for INR Ailyn Gladd K. [place], [facility] [phone]

## 2018-07-21 ENCOUNTER — Encounter: Payer: Self-pay | Admitting: Nurse Practitioner

## 2018-07-21 LAB — BASIC METABOLIC PANEL WITH GFR
BUN: 18 mg/dL (ref 7–25)
CHLORIDE: 100 mmol/L (ref 98–110)
CO2: 23 mmol/L (ref 20–32)
Calcium: 10.2 mg/dL (ref 8.6–10.4)
Creat: 0.83 mg/dL (ref 0.60–0.93)
GFR, Est African American: 79 mL/min/{1.73_m2} (ref >=60)
GFR, Est Non African American: 68 mL/min/{1.73_m2} (ref >=60)
Glucose, Bld: 108 mg/dL (ref 65–139)
Potassium: 4.2 mmol/L (ref 3.5–5.3)
Sodium: 134 mmol/L — ABNORMAL LOW (ref 135–146)

## 2018-07-21 LAB — PROTIME-INR
INR: 4.5 — ABNORMAL HIGH
Prothrombin Time: 42 s — ABNORMAL HIGH (ref 9.0–11.5)

## 2018-07-21 NOTE — Telephone Encounter (Signed)
Janett Billow please advise on what you wrote on patient's instructions. I am only able to advise patient on what is in [employer]

## 2018-07-22 ENCOUNTER — Encounter: Payer: Self-pay | Admitting: Nurse Practitioner

## 2018-07-26 ENCOUNTER — Telehealth: Payer: Self-pay

## 2018-07-26 NOTE — Telephone Encounter (Signed)
Left message on voicemail for patient to return call when available   Reason for call: Per Janett Billow patient was to schedule a PT/INR appointment for today, refer to labs dated 07/20/2018  Awaiting return call  CB

## 2018-07-26 NOTE — Telephone Encounter (Signed)
-----   Message from Pamala Duffel sent at 07/26/2018  1:42 PM EST ----- Left message - no answer.  Sarah ----- Message ----- From: Logan Bores, [place]: 07/26/2018   1:20 PM EST To: Pamala Duffel  Please call patient to schedule INR for tomorrow  Per Janett Billow,   CB

## 2018-07-27 NOTE — Telephone Encounter (Signed)
Patient called the office and left message on Clinical intake and stated that she had a INR done on the 25th and already has an appointment scheduled for 3/16.   Tried calling patient. LMOM to return call regarding labs dated 07/20/18

## 2018-07-27 NOTE — Telephone Encounter (Signed)
Left message on voicemail for patient to return call when available    Left message on voicemail for Pam, patient's daughter. I advised Pam to contact patient and have her call the office to schedule appointment for PT/INR check  CB

## 2018-07-28 ENCOUNTER — Ambulatory Visit (INDEPENDENT_AMBULATORY_CARE_PROVIDER_SITE_OTHER): Payer: Medicare Other | Admitting: Family

## 2018-07-28 ENCOUNTER — Other Ambulatory Visit: Payer: Self-pay | Admitting: *Deleted

## 2018-07-28 ENCOUNTER — Encounter: Payer: Self-pay | Admitting: Family

## 2018-07-28 ENCOUNTER — Encounter: Payer: Self-pay | Admitting: Nurse Practitioner

## 2018-07-28 VITALS — BP 158/80 | HR 85 | Temp 97.5°F | Ht 63.0 in | Wt 164.0 lb

## 2018-07-28 DIAGNOSIS — M810 Age-related osteoporosis without current pathological fracture: Secondary | ICD-10-CM | POA: Diagnosis not present

## 2018-07-28 DIAGNOSIS — Z7901 Long term (current) use of anticoagulants: Secondary | ICD-10-CM

## 2018-07-28 DIAGNOSIS — I480 Paroxysmal atrial fibrillation: Secondary | ICD-10-CM | POA: Diagnosis not present

## 2018-07-28 LAB — POCT INR: INR: 3.4 — AB (ref 2.0–3.0)

## 2018-07-28 MED ORDER — ALENDRONATE SODIUM 70 MG PO TABS: 70.0000 mg | ORAL_TABLET | ORAL | 1 refills | Status: DC

## 2018-07-28 NOTE — Progress Notes (Signed)
Provider: Dinah Ngetich FNP-C  [name], NP  Patient Care Team: [name], NP as PCP - General (Geriatric Medicine)  Extended Emergency Contact Information Primary Emergency Contact: Bracknell,Pam Address: [address] [place] Phone: [phone] Mobile Phone: [phone] Relation: Daughter Secondary Emergency Contact: Pilar Plate [place] Phone: [phone] Mobile Phone: [phone] Relation: Other   Goals of care: Advanced Directive information Advanced Directives 03/30/2018  Does Patient Have a Medical Advance Directive? Yes  Type of Paramedic of [place];Living will  Does patient want to make changes to medical advance directive? -  Copy of [place] in Chart? Yes - validated most recent copy scanned in chart (See row information)  Would patient like information on creating a medical advance directive? -  Pre-existing out of facility DNR order (yellow form or pink MOST form) -     Chief Complaint  Patient presents with  . Anticoagulation    INR check 3.4     HPI:  Pt is a 78 y.o. female seen today for an acute visit for evaluation of INR.Her previous INR was 4.5.Today her INR 3.4 with goal 2.5-3.5 for Afib post MVR in 1990 2/2 MR she has a mechanical valve.she states has been taking coumadin 4 mg tablet daily as directed.she has not missed any doses.she denies any changes in her diet or bleeding.B/p readings has improved.she states dizziness has improved since stopping her metoprolol.      Past Medical History:  Diagnosis Date  . Broken hip ([place]) 10/2011   left  . Broken wrist   . Depression   . Diarrhea   . H/O long-term (current) use of anticoagulants   . High cholesterol   . Hypertension   . Memory difficulty 01/18/2016  . 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 Osteoporosis   . SIADH (syndrome of inappropriate  ADH production) ([place])   . Urinary retention    chronic  . Vertigo 01/18/2016   Past Surgical History:  Procedure Laterality Date  . HEMATOMA EVACUATION Left 11/25/2012   Procedure: EVACUATION HEMATOMA;  Surgeon: Linna Hoff, MD;  Location: [facility];  Service: Orthopedics;  Laterality: Left;  . HIP PINNING,CANNULATED Left 11/03/2012   Procedure: CANNULATED HIP PINNING;  Surgeon: Tobi Bastos, MD;  Location: [facility];  Service: Orthopedics;  Laterality: Left;  . IR INJECT/THERA/INC NEEDLE/CATH/PLC EPI/LUMB/SAC W/IMG  07/20/2017  . 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  . OPEN REDUCTION INTERNAL FIXATION (ORIF) WRIST WITH ILIAC CREST BONE GRAFT Left 11/24/2012   Procedure: OPEN REDUCTION INTERNAL FIXATION LEFT WRIST POSSIBLE BONE GRAFTING AND PINNING (ORIF) ;  Surgeon: Linna Hoff, MD;  Location: [facility];  Service: Orthopedics;  Laterality: Left;  . SHOULDER OPEN ROTATOR CUFF REPAIR      Allergies  Allergen Reactions  . Carvedilol Itching  . Ace Inhibitors Other (See Comments)    Reaction (??)  . Amlodipine Swelling  . Hydrochlorothiazide Other (See Comments)    Hyponatremia   . Metoprolol Other (See Comments)    Dizziness     Outpatient Encounter Medications as of 07/28/2018  Medication Sig  . [START ON 08/01/2018] alendronate (FOSAMAX) 70 MG tablet Take 1 tablet (70 mg total) by mouth every Sunday. Take with a full glass of water on an empty stomach.  Marland Kitchen aspirin EC 81 MG tablet  Take 1 tablet (81 mg total) by mouth daily.  Marland Kitchen atorvastatin (LIPITOR) 10 MG tablet TAKE ONE TABLET BY MOUTH AT BEDTIME  . hydrALAZINE (APRESOLINE) 10 MG tablet Take 1 tablet (10 mg total) by mouth 2 (two) times daily.  Marland Kitchen HYDROcodone-acetaminophen (NORCO/VICODIN) 5-325 MG tablet Take 0.5 tablets by mouth at bedtime.   . Omega 3 1200 MG CAPS Take 1 capsule by mouth 2 (two) times daily.  Marland Kitchen spironolactone (ALDACTONE) 25 MG tablet Take 0.5 tablets (12.5 mg total) by mouth daily for 30 days.  . traZODone  (DESYREL) 50 MG tablet Take 50 mg by mouth at bedtime as needed.   . warfarin (COUMADIN) 4 MG tablet Take 1 tablet (4 mg) by mouth daily except Tuesdays and Fridays take 1.5 tablet (6 mg) by mouth daily  . [DISCONTINUED] predniSONE (DELTASONE) 20 MG tablet Take 1 tablet (20 mg total) by mouth 2 (two) times daily with a meal.   No facility-administered encounter medications on file as of 07/28/2018.     Review of Systems  HENT: Negative for nosebleeds.   Respiratory: Negative for cough, chest tightness, shortness of breath and wheezing.   Cardiovascular: Negative for chest pain, palpitations and leg swelling.  Gastrointestinal: Negative for abdominal distention, abdominal pain, blood in stool, constipation, diarrhea, nausea, rectal pain and vomiting.  Genitourinary: Negative for dysuria, flank pain and hematuria.  Neurological: Negative for light-headedness and headaches.       Dizziness has improved since stopping metoprolol     Immunization History  Administered Date(s) Administered  . Influenza, High Dose Seasonal PF 02/17/2014, 02/14/2015  . Influenza, Seasonal, Injecte, Preservative Fre 02/14/2016  . Influenza,inj,Quad PF,6+ Mos 02/10/2018  . Influenza-Unspecified 02/23/2013, 02/26/2017  . Pneumococcal Conjugate-13 08/27/2013  . Pneumococcal Polysaccharide-23 05/03/2007, 09/26/2009, 02/17/2014  . Td 06/26/2005, 07/09/2005  . Tetanus 06/26/2005, 07/09/2005  . Zoster 08/19/2013   Pertinent  Health Maintenance Due  Topic Date Due  . COLONOSCOPY  03/31/2019 (Originally 09/20/2015)  . INFLUENZA VACCINE  Completed  . DEXA SCAN  Completed  . PNA vac Low Risk Adult  Completed   Fall Risk  07/28/2018 07/20/2018 06/29/2018 06/02/2018 04/29/2018  Falls in the past year? 0 0 0 0 0  Number falls in past yr: 0 0 0 0 0  Injury with Fall? 0 0 0 0 0    Vitals:   07/28/18 1409  BP: (!) 158/80  Pulse: 85  Temp: (!) 97.5 F (36.4 C)  TempSrc: Oral  SpO2: 96%  Weight: 164 lb (74.4 kg)  Height:  5\' 3"  (1.6 m)   Body mass index is 29.05 kg/m. Physical Exam Vitals signs reviewed.  Constitutional:      General: She is not in acute distress.    Appearance: She is overweight.  HENT:     Head: Normocephalic.     Mouth/Throat:     Mouth: Mucous membranes are moist.     Pharynx: Oropharynx is clear. No oropharyngeal exudate or posterior oropharyngeal erythema.  Eyes:     General: No scleral icterus.       Right eye: No discharge.        Left eye: No discharge.     Conjunctiva/sclera: Conjunctivae normal.     Pupils: Pupils are equal, round, and reactive to light.  Neck:     Musculoskeletal: Normal range of motion. No neck rigidity or muscular tenderness.     Vascular: No carotid bruit.  Cardiovascular:     Rate and Rhythm: Normal rate and regular rhythm.  Heart sounds: Normal heart sounds. No murmur. No friction rub. No gallop.   Pulmonary:     Effort: Pulmonary effort is normal. No respiratory distress.     Breath sounds: Normal breath sounds. No wheezing, rhonchi or rales.  Chest:     Chest wall: No tenderness.  Abdominal:     General: Bowel sounds are normal. There is no distension.     Palpations: Abdomen is soft. There is no mass.     Tenderness: There is no abdominal tenderness. There is no right CVA tenderness, left CVA tenderness, guarding or rebound.  Musculoskeletal: Normal range of motion.        General: No swelling or tenderness.     Right lower leg: No edema.     Left lower leg: No edema.  Lymphadenopathy:     Cervical: No cervical adenopathy.  Skin:    General: Skin is warm and dry.     Coloration: Skin is not pale.     Findings: No erythema or rash.  Neurological:     Mental Status: She is alert and oriented to person, place, and time.     Cranial Nerves: No cranial nerve deficit.     Sensory: No sensory deficit.     Coordination: Coordination normal.  Psychiatric:        Mood and Affect: Mood normal.        Behavior: Behavior normal.         Thought Content: Thought content normal.        Judgment: Judgment normal.    Labs reviewed: Recent Labs    10/02/17 1000 03/30/18 1456 07/20/18 1400  NA 136 135 134*  K 4.2 4.4 4.2  CL 102 99 100  CO2 27 27 23   GLUCOSE 105* 99 108  BUN 12 18 18   CREATININE 0.70 0.77 0.83  CALCIUM 9.7 10.6* 10.2   Recent Labs    10/02/17 1000 03/30/18 1456  AST 15 19  ALT 12 16  BILITOT 0.9 1.5*  PROT 6.8 7.4   Recent Labs    10/02/17 1000 03/30/18 1456  WBC 8.7 7.7  NEUTROABS 6,012 5,552  HGB 13.5 14.9  HCT 41.7 45.8*  MCV 88.5 86.3  PLT 353 361   Lab Results  Component Value Date   TSH 0.72 10/02/2017   Lab Results  Component Value Date   HGBA1C 5.7 (H) 03/30/2018   Lab Results  Component Value Date   CHOL 185 10/02/2017   HDL 71 10/02/2017   LDLCALC 101 (H) 10/02/2017   TRIG 50 10/02/2017   CHOLHDL 2.6 10/02/2017    Significant Diagnostic Results in last 30 days:  No results found.  Assessment/Plan 1. Paroxysmal atrial fibrillation (HCC) INR therapeutic today as above.No signs of bleeding reported.continue on coumadin 4 mg tablet daily. - appointment for INR recheck 08/09/2018 in place.    2. Age related osteoporosis, unspecified pathological fracture presence No recent fall or fractures. - alendronate (FOSAMAX) 70 MG tablet; Take 1 tablet (70 mg total) by mouth every Sunday. Take with a full glass of water on an empty stomach.  Dispense: 12 tablet; Refill: 1  3.Long term use of anticoagulant  - POC INR INR therapeutic this visit. Continue on coumadin 4 mg tablet daily INR 08/09/2018   Family/ staff Communication: Reviewed plan of care with patient   Labs/tests ordered: INR 08/09/2018  Sandrea Hughs, NP

## 2018-07-28 NOTE — Patient Instructions (Signed)
1. Continue on coumadin 4 mg tablet one by mouth daily  2. Follow up 08/09/2018 for INR check

## 2018-07-28 NOTE — Telephone Encounter (Signed)
[employer] Francis King 

## 2018-07-28 NOTE — Telephone Encounter (Addendum)
Patient has appointment to see Webb Silversmith today

## 2018-08-09 ENCOUNTER — Encounter: Payer: Self-pay | Admitting: Nurse Practitioner

## 2018-08-09 ENCOUNTER — Other Ambulatory Visit: Payer: Self-pay

## 2018-08-09 ENCOUNTER — Ambulatory Visit (INDEPENDENT_AMBULATORY_CARE_PROVIDER_SITE_OTHER): Payer: Medicare Other | Admitting: Nurse Practitioner

## 2018-08-09 VITALS — BP 160/78 | HR 84 | Temp 98.1°F | Ht 63.0 in | Wt 164.4 lb

## 2018-08-09 DIAGNOSIS — R42 Dizziness and giddiness: Secondary | ICD-10-CM

## 2018-08-09 DIAGNOSIS — I10 Essential (primary) hypertension: Secondary | ICD-10-CM

## 2018-08-09 DIAGNOSIS — Z6829 Body mass index (BMI) 29.0-29.9, adult: Secondary | ICD-10-CM

## 2018-08-09 DIAGNOSIS — E663 Overweight: Secondary | ICD-10-CM | POA: Diagnosis not present

## 2018-08-09 DIAGNOSIS — I480 Paroxysmal atrial fibrillation: Secondary | ICD-10-CM

## 2018-08-09 DIAGNOSIS — Z7901 Long term (current) use of anticoagulants: Secondary | ICD-10-CM

## 2018-08-09 LAB — POCT INR: INR: 3.2 — AB (ref 2.0–3.0)

## 2018-08-09 MED ORDER — HYDRALAZINE HCL 25 MG PO TABS: 25.0000 mg | ORAL_TABLET | Freq: Two times a day (BID) | ORAL | 1 refills | Status: DC

## 2018-08-09 MED ORDER — SPIRONOLACTONE 25 MG PO TABS: 12.5000 mg | ORAL_TABLET | Freq: Every day | ORAL | 1 refills | Status: DC

## 2018-08-09 NOTE — Patient Instructions (Addendum)
Increase hydralazine to 25 mg by mouth twice daily for high blood pressure Continue spironolactone 12.5 mg by mouth daily  Goal blood pressure <140/90- check at home, bring blood pressures reading with follow up  Continue coumadin 4 mg daily   Fat and Cholesterol Restricted Eating Plan Getting too much fat and cholesterol in your diet may cause health problems. Choosing the right foods helps keep your fat and cholesterol at normal levels. This can keep you from getting certain diseases.  What are tips for following this plan? Meal planning  At meals, divide your plate into four equal parts: ? Fill one-half of your plate with vegetables and green salads. ? Fill one-fourth of your plate with whole grains. ? Fill one-fourth of your plate with low-fat (lean) protein foods.  Eat fish that is high in omega-3 fats at least two times a week. This includes mackerel, tuna, sardines, and salmon.  Eat foods that are high in fiber, such as whole grains, beans, apples, broccoli, carrots, peas, and barley. General tips   Work with your doctor to lose weight if you need to.  Avoid: ? Foods with added sugar. ? Fried foods. ? Foods with partially hydrogenated oils.  Limit alcohol intake to no more than 1 drink a day for nonpregnant women and 2 drinks a day for men. One drink equals 12 oz of beer, 5 oz of wine, or 1 oz of hard liquor. Reading food labels  Check food labels for: ? Trans fats. ? Partially hydrogenated oils. ? Saturated fat (g) in each serving. ? Cholesterol (mg) in each serving. ? Fiber (g) in each serving.  Choose foods with healthy fats, such as: ? Monounsaturated fats. ? Polyunsaturated fats. ? Omega-3 fats.  Choose grain products that have whole grains. Look for the word "whole" as the first word in the ingredient list. Cooking  Cook foods using low-fat methods. These include baking, boiling, grilling, and broiling.  Eat more home-cooked foods. Eat at  restaurants and buffets less often.  Avoid cooking using saturated fats, such as butter, cream, palm oil, palm kernel oil, and coconut oil. Recommended foods  Fruits  All fresh, canned (in natural juice), or frozen fruits. Vegetables  Fresh or frozen vegetables (raw, steamed, roasted, or grilled). Green salads. Grains  Whole grains, such as whole wheat or whole grain breads, crackers, cereals, and pasta. Unsweetened oatmeal, bulgur, barley, quinoa, or brown rice. Corn or whole wheat flour tortillas. Meats and other protein foods  Ground beef (85% or leaner), grass-fed beef, or beef trimmed of fat. Skinless chicken or [place]. Ground chicken or [place]. Pork trimmed of fat. All fish and seafood. Egg whites. Dried beans, peas, or lentils. Unsalted nuts or seeds. Unsalted canned beans. Nut butters without added sugar or oil. Dairy  Low-fat or nonfat dairy products, such as skim or 1% milk, 2% or reduced-fat cheeses, low-fat and fat-free ricotta or cottage cheese, or plain low-fat and nonfat yogurt. Fats and oils  Tub margarine without trans fats. Light or reduced-fat mayonnaise and salad dressings. Avocado. Olive, canola, sesame, or safflower oils. The items listed above may not be a complete list of foods and beverages you can eat. Contact a dietitian for more information. Foods to avoid Fruits  Canned fruit in heavy syrup. Fruit in cream or butter sauce. Fried fruit. Vegetables  Vegetables cooked in cheese, cream, or butter sauce. Fried vegetables. Grains  White bread. White pasta. White rice. Cornbread. Bagels, pastries, and croissants. Crackers and snack foods that contain trans  fat and hydrogenated oils. Meats and other protein foods  Fatty cuts of meat. Ribs, chicken wings, bacon, sausage, bologna, salami, chitterlings, fatback, hot dogs, bratwurst, and packaged lunch meats. Liver and organ meats. Whole eggs and egg yolks. Chicken and [place] with skin. Fried meat. Dairy  Whole  or 2% milk, cream, half-and-half, and cream cheese. Whole milk cheeses. Whole-fat or sweetened yogurt. Full-fat cheeses. Nondairy creamers and whipped toppings. Processed cheese, cheese spreads, and cheese curds. Beverages  Alcohol. Sugar-sweetened drinks such as sodas, lemonade, and fruit drinks. Fats and oils  Butter, stick margarine, lard, shortening, ghee, or bacon fat. Coconut, palm kernel, and palm oils. Sweets and desserts  Corn syrup, sugars, honey, and molasses. Candy. Jam and jelly. Syrup. Sweetened cereals. Cookies, pies, cakes, donuts, muffins, and ice cream. The items listed above may not be a complete list of foods and beverages you should avoid. Contact a dietitian for more information. Summary  Choosing the right foods helps keep your fat and cholesterol at normal levels. This can keep you from getting certain diseases.  At meals, fill one-half of your plate with vegetables and green salads.  Eat high-fiber foods, like whole grains, beans, apples, carrots, peas, and barley.  Limit added sugar, saturated fats, alcohol, and fried foods. This information is not intended to replace advice given to you by your health care provider. Make sure you discuss any questions you have with your health care provider. Document Released: 11/11/2011 Document Revised: 01/13/2018 Document Reviewed: 01/27/2017 Elsevier Interactive Patient Education  2019 [employer].

## 2018-08-09 NOTE — Progress Notes (Signed)
Careteam: Patient Care Team: [name], NP as PCP - General (Geriatric Medicine)  Advanced Directive information    Allergies  Allergen Reactions  . Carvedilol Itching  . Ace Inhibitors Other (See Comments)    Reaction (??)  . Amlodipine Swelling  . Hydrochlorothiazide Other (See Comments)    Hyponatremia   . Metoprolol Other (See Comments)    Dizziness     Chief Complaint  Patient presents with  . Anticoagulation    INR recheck 3.2 and BP recheck   . Medication Management    would like to discuss spironolactone renewed     HPI: Patient is a 78 y.o. female seen in the office today for INR check and blood pressure follow up.   htn- has been poorly controlled, lots of intolerances to blood pressure medication.  she was previously on metoprolol but could not tolerate it so she stopped taking. She as placed on spironolactone which did not control blood pressure therefore she was started on hydralazine 10 mg by mouth twice daily.   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 she states has been taking coumadin 4 mg tablet daily     Review of Systems:  Review of Systems  Constitutional: Negative for chills, fever, malaise/fatigue and weight loss.  Respiratory: Negative for cough, sputum production and shortness of breath.   Cardiovascular: Negative for chest pain and palpitations.  Musculoskeletal: Negative for myalgias.  Neurological: Negative for dizziness, tingling and headaches.    Past Medical History:  Diagnosis Date  . Broken hip ([place]) 10/2011   left  . Broken wrist   . Depression   . Diarrhea   . H/O long-term (current) use of anticoagulants   . High cholesterol   . Hypertension   . Memory difficulty 01/18/2016  . 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 Osteoporosis   . SIADH (syndrome of inappropriate ADH production) ([place])   .  Urinary retention    chronic  . Vertigo 01/18/2016   Past Surgical History:  Procedure Laterality Date  . HEMATOMA EVACUATION Left 11/25/2012   Procedure: EVACUATION HEMATOMA;  Surgeon: Linna Hoff, MD;  Location: [place];  Service: Orthopedics;  Laterality: Left;  . HIP PINNING,CANNULATED Left 11/03/2012   Procedure: CANNULATED HIP PINNING;  Surgeon: Tobi Bastos, MD;  Location: [facility];  Service: Orthopedics;  Laterality: Left;  . IR INJECT/THERA/INC NEEDLE/CATH/PLC EPI/LUMB/SAC W/IMG  07/20/2017  . 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  . OPEN REDUCTION INTERNAL FIXATION (ORIF) WRIST WITH ILIAC CREST BONE GRAFT Left 11/24/2012   Procedure: OPEN REDUCTION INTERNAL FIXATION LEFT WRIST POSSIBLE BONE GRAFTING AND PINNING (ORIF) ;  Surgeon: Linna Hoff, MD;  Location: [place];  Service: Orthopedics;  Laterality: Left;  . SHOULDER OPEN ROTATOR CUFF REPAIR     Social History:   reports that she has never smoked. She has never used smokeless tobacco. She reports that she does not drink alcohol or use drugs.  Family History  Problem Relation Age of Onset  . Stroke Mother   . Hypertension Mother   . Stroke Father   . Colon cancer Paternal Grandmother   . Parkinsonism Brother     Medications: Patient's Medications  New Prescriptions   No medications on file  Previous Medications   ALENDRONATE (FOSAMAX) 70 MG TABLET  Take 1 tablet (70 mg total) by mouth every Sunday. Take with a full glass of water on an empty stomach.   ASPIRIN EC 81 MG TABLET    Take 1 tablet (81 mg total) by mouth daily.   ATORVASTATIN (LIPITOR) 10 MG TABLET    TAKE ONE TABLET BY MOUTH AT BEDTIME   HYDRALAZINE (APRESOLINE) 10 MG TABLET    Take 1 tablet (10 mg total) by mouth 2 (two) times daily.   OMEGA 3 1200 MG CAPS    Take 1 capsule by mouth 2 (two) times daily.   SPIRONOLACTONE (ALDACTONE) 25 MG TABLET    Take 0.5 tablets (12.5 mg total) by mouth daily for 30 days.   SPIRONOLACTONE (ALDACTONE) 25 MG  TABLET    Take 12.5 mg by mouth daily.   TRAZODONE (DESYREL) 50 MG TABLET    Take 50 mg by mouth at bedtime as needed.    WARFARIN (COUMADIN) 4 MG TABLET    Take 4 mg by mouth daily.  Modified Medications   No medications on file  Discontinued Medications   HYDROCODONE-ACETAMINOPHEN (NORCO/VICODIN) 5-325 MG TABLET    Take 0.5 tablets by mouth at bedtime.    WARFARIN (COUMADIN) 4 MG TABLET    Take 1 tablet (4 mg) by mouth daily except Tuesdays and Fridays take 1.5 tablet (6 mg) by mouth daily     Physical Exam:  Vitals:   08/09/18 1309  BP: (!) 160/78  Pulse: 84  Temp: 98.1 F (36.7 C)  TempSrc: Oral  SpO2: 96%  Weight: 164 lb 6.4 oz (74.6 kg)  Height: 5\' 3"  (1.6 m)   Body mass index is 29.12 kg/m.  Physical Exam Vitals signs reviewed.  Constitutional:      General: She is not in acute distress.    Appearance: She is overweight.  HENT:     Head: Normocephalic.     Mouth/Throat:     Mouth: Mucous membranes are moist.     Pharynx: Oropharynx is clear. No oropharyngeal exudate or posterior oropharyngeal erythema.  Eyes:     Pupils: Pupils are equal, round, and reactive to light.  Neck:     Musculoskeletal: Normal range of motion.  Cardiovascular:     Rate and Rhythm: Normal rate. Rhythm irregular.     Heart sounds: Normal heart sounds. No murmur. No friction rub. No gallop.   Pulmonary:     Effort: Pulmonary effort is normal.     Breath sounds: Normal breath sounds.  Abdominal:     General: Bowel sounds are normal.     Palpations: Abdomen is soft.  Musculoskeletal: Normal range of motion.        General: No swelling or tenderness.     Right lower leg: No edema.     Left lower leg: No edema.  Skin:    General: Skin is warm and dry.     Coloration: Skin is not pale.     Findings: No erythema or rash.  Neurological:     Mental Status: She is alert and oriented to person, place, and time.     Cranial Nerves: No cranial nerve deficit.     Sensory: No sensory  deficit.     Coordination: Coordination normal.  Psychiatric:        Mood and Affect: Mood normal.        Behavior: Behavior normal.        Thought Content: Thought content normal.        Judgment: Judgment normal.  Labs reviewed: Basic Metabolic Panel: Recent Labs    10/02/17 1000 03/30/18 1456 07/20/18 1400  NA 136 135 134*  K 4.2 4.4 4.2  CL 102 99 100  CO2 27 27 23   GLUCOSE 105* 99 108  BUN 12 18 18   CREATININE 0.70 0.77 0.83  CALCIUM 9.7 10.6* 10.2  TSH 0.72  --   --    Liver Function Tests: Recent Labs    10/02/17 1000 03/30/18 1456  AST 15 19  ALT 12 16  BILITOT 0.9 1.5*  PROT 6.8 7.4   No results for input(s): LIPASE, AMYLASE in the last 8760 hours. No results for input(s): AMMONIA in the last 8760 hours. CBC: Recent Labs    10/02/17 1000 03/30/18 1456  WBC 8.7 7.7  NEUTROABS 6,012 5,552  HGB 13.5 14.9  HCT 41.7 45.8*  MCV 88.5 86.3  PLT 353 361   Lipid Panel: Recent Labs    10/02/17 1000  CHOL 185  HDL 71  LDLCALC 101*  TRIG 50  CHOLHDL 2.6   TSH: Recent Labs    10/02/17 1000  TSH 0.72   A1C: Lab Results  Component Value Date   HGBA1C 5.7 (H) 03/30/2018     Assessment/Plan 1. Long term (current) use of anticoagulants - POC INR at goal 3.2, will continue on coumadin 4 mg by mouth daily.    2. Essential hypertension, benign Remains elevated, will increase hydralazine to 25 mg PO BID.  -continue on aldactone.  - spironolactone (ALDACTONE) 25 MG tablet; Take 0.5 tablets (12.5 mg total) by mouth daily.  Dispense: 90 tablet; Refill: 1 - hydrALAZINE (APRESOLINE) 25 MG tablet; Take 1 tablet (25 mg total) by mouth 2 (two) times daily.  Dispense: 180 tablet; Refill: 1  3. Dizziness Resolved.   4. Paroxysmal atrial fibrillation (HCC) Rate controlled, off metoprolol at this time. Continues on coumadin 4 mg by mouth daily   5. Overweight with body mass index (BMI) 25.0-29.9 -noted today, discuss proper diet and increasing  activity as tolerates  6. Body mass index 29.0-29.9, adult Noted today.  Next appt: 4 weeks for INR Charmelle Soh K. [place], [facility] [phone]

## 2018-08-23 ENCOUNTER — Encounter: Payer: Self-pay | Admitting: Nurse Practitioner

## 2018-08-24 ENCOUNTER — Encounter: Payer: Self-pay | Admitting: Nurse Practitioner

## 2018-09-01 ENCOUNTER — Encounter: Payer: Self-pay | Admitting: Nurse Practitioner

## 2018-09-07 ENCOUNTER — Other Ambulatory Visit: Payer: Self-pay

## 2018-09-07 ENCOUNTER — Ambulatory Visit: Payer: Self-pay | Admitting: Nurse Practitioner

## 2018-09-07 ENCOUNTER — Ambulatory Visit: Payer: Medicare Other | Admitting: Nurse Practitioner

## 2018-09-07 ENCOUNTER — Ambulatory Visit (INDEPENDENT_AMBULATORY_CARE_PROVIDER_SITE_OTHER): Payer: Medicare Other | Admitting: Nurse Practitioner

## 2018-09-07 ENCOUNTER — Encounter: Payer: Self-pay | Admitting: Nurse Practitioner

## 2018-09-07 VITALS — BP 156/90 | HR 94 | Temp 98.0°F | Ht 63.0 in | Wt 158.0 lb

## 2018-09-07 DIAGNOSIS — Z952 Presence of prosthetic heart valve: Secondary | ICD-10-CM | POA: Diagnosis not present

## 2018-09-07 DIAGNOSIS — Z7901 Long term (current) use of anticoagulants: Secondary | ICD-10-CM

## 2018-09-07 DIAGNOSIS — I480 Paroxysmal atrial fibrillation: Secondary | ICD-10-CM

## 2018-09-07 DIAGNOSIS — I10 Essential (primary) hypertension: Secondary | ICD-10-CM

## 2018-09-07 LAB — CBC WITH DIFFERENTIAL/PLATELET
Absolute Monocytes: 636 cells/uL (ref 200–950)
Basophils Absolute: 30 cells/uL (ref 0–200)
Basophils Relative: 0.4 %
Eosinophils Absolute: 148 cells/uL (ref 15–500)
Eosinophils Relative: 2 %
HCT: 46.7 % — ABNORMAL HIGH (ref 35.0–45.0)
Hemoglobin: 15.3 g/dL (ref 11.7–15.5)
Lymphs Abs: 1073 cells/uL (ref 850–3900)
MCH: 28.7 pg (ref 27.0–33.0)
MCHC: 32.8 g/dL (ref 32.0–36.0)
MCV: 87.5 fL (ref 80.0–100.0)
MPV: 10.5 fL (ref 7.5–12.5)
Monocytes Relative: 8.6 %
Neutro Abs: 5513 cells/uL (ref 1500–7800)
Neutrophils Relative %: 74.5 %
Platelets: 345 10*3/uL (ref 140–400)
RBC: 5.34 10*6/uL — ABNORMAL HIGH (ref 3.80–5.10)
RDW: 12.6 % (ref 11.0–15.0)
Total Lymphocyte: 14.5 %
WBC: 7.4 10*3/uL (ref 3.8–10.8)

## 2018-09-07 LAB — BASIC METABOLIC PANEL WITH GFR
BUN: 17 mg/dL (ref 7–25)
CO2: 26 mmol/L (ref 20–32)
Calcium: 10.1 mg/dL (ref 8.6–10.4)
Chloride: 102 mmol/L (ref 98–110)
Creat: 0.73 mg/dL (ref 0.60–0.93)
GFR, Est African American: 93 mL/min/{1.73_m2} (ref >=60)
GFR, Est Non African American: 80 mL/min/{1.73_m2} (ref >=60)
Glucose, Bld: 116 mg/dL (ref 65–139)
Potassium: 4.1 mmol/L (ref 3.5–5.3)
Sodium: 136 mmol/L (ref 135–146)

## 2018-09-07 LAB — POCT INR: INR: 1.9 — AB (ref 2.0–3.0)

## 2018-09-07 MED ORDER — HYDRALAZINE HCL 25 MG PO TABS: 25.0000 mg | ORAL_TABLET | Freq: Three times a day (TID) | ORAL | 0 refills | Status: DC

## 2018-09-07 MED ORDER — SPIRONOLACTONE 25 MG PO TABS: 12.5000 mg | ORAL_TABLET | Freq: Every day | ORAL | 1 refills | Status: DC

## 2018-09-07 MED ORDER — ATORVASTATIN CALCIUM 10 MG PO TABS: 10.0000 mg | ORAL_TABLET | Freq: Every day | ORAL | 1 refills | Status: DC

## 2018-09-07 MED ORDER — WARFARIN SODIUM 4 MG PO TABS: ORAL_TABLET | ORAL | Status: DC

## 2018-09-07 NOTE — Progress Notes (Signed)
Careteam: Patient Care Team: [name], NP as PCP - General (Geriatric Medicine)  Advanced Directive information    Allergies  Allergen Reactions  . Carvedilol Itching  . Ace Inhibitors Other (See Comments)    Reaction (??)  . Amlodipine Swelling  . Hydrochlorothiazide Other (See Comments)    Hyponatremia   . Metoprolol Other (See Comments)    Dizziness     Chief Complaint  Patient presents with  . Anticoagulation    PT/INR check (1.9), no missed doses, no bleeding, and no diet changes   . Follow-up    B/P follow-up. Compared cuffs, PSC 156/90, and patient's cuff 166/104     HPI: Patient is a 78 y.o. female seen in the office today for INR and to review blood pressures. Blood pressure readings at home leevated 153-196/88-125 some of the readings are before medication however.  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 she states has been taking coumadin 4 mg tablet daily. Previously on 4 mg daily except Tuesday and fridays she was taking 6 mg- INR was elevated at that time.  No changes in medication or diet.  No bleeding or bruising. No shortness of breath, chest pains, swelling.    Review of Systems:  Review of Systems  Constitutional: Negative for chills, fever, malaise/fatigue and weight loss.  Respiratory: Negative for cough, sputum production and shortness of breath.   Cardiovascular: Negative for chest pain and palpitations.  Musculoskeletal: Negative for myalgias.  Neurological: Negative for dizziness, tingling and headaches.  Endo/Heme/Allergies: Negative for environmental allergies. Does not bruise/bleed easily.    Past Medical History:  Diagnosis Date  . Broken hip ([place]) 10/2011   left  . Broken wrist   . Depression   . Diarrhea   . H/O long-term (current)  use of anticoagulants   . High cholesterol   . Hypertension   . Memory difficulty 01/18/2016  . 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 Osteoporosis   . SIADH (syndrome of inappropriate ADH production) ([place])   . Urinary retention    chronic  . Vertigo 01/18/2016   Past Surgical History:  Procedure Laterality Date  . HEMATOMA EVACUATION Left 11/25/2012   Procedure: EVACUATION HEMATOMA;  Surgeon: Linna Hoff, MD;  Location: [place];  Service: Orthopedics;  Laterality: Left;  . HIP PINNING,CANNULATED Left 11/03/2012   Procedure: CANNULATED HIP PINNING;  Surgeon: Tobi Bastos, MD;  [facility];  Service: Orthopedics;  Laterality: Left;  . IR INJECT/THERA/INC NEEDLE/CATH/PLC EPI/LUMB/SAC W/IMG  07/20/2017  . 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  . OPEN REDUCTION INTERNAL FIXATION (ORIF) WRIST WITH ILIAC CREST BONE GRAFT Left 11/24/2012   Procedure: OPEN REDUCTION INTERNAL FIXATION LEFT WRIST POSSIBLE BONE GRAFTING AND PINNING (ORIF) ;  Surgeon: Linna Hoff, MD;  Location: [place];  Service: Orthopedics;  Laterality: Left;  . SHOULDER OPEN ROTATOR CUFF REPAIR     Social History:   reports that she has never smoked. She has never used smokeless tobacco. She reports that she does not drink alcohol or use drugs.  Family History  Problem Relation Age of Onset  . Stroke Mother   . Hypertension Mother   . Stroke Father   . Colon cancer Paternal  Grandmother   . Parkinsonism Brother     Medications: Patient's Medications  New Prescriptions   No medications on file  Previous Medications   ALENDRONATE (FOSAMAX) 70 MG TABLET    Take 1 tablet (70 mg total) by mouth every Sunday. Take with a full glass of water on an empty stomach.   ASPIRIN EC 81 MG TABLET    Take 1 tablet (81 mg total) by mouth daily.   ATORVASTATIN (LIPITOR) 10 MG TABLET    TAKE ONE TABLET BY MOUTH AT BEDTIME   HYDRALAZINE (APRESOLINE) 25 MG TABLET    Take 1 tablet (25 mg total) by mouth 2 (two) times  daily.   OMEGA 3 1200 MG CAPS    Take 1 capsule by mouth 2 (two) times daily.   SPIRONOLACTONE (ALDACTONE) 25 MG TABLET    Take 0.5 tablets (12.5 mg total) by mouth daily.   TRAZODONE (DESYREL) 50 MG TABLET    Take 50 mg by mouth at bedtime as needed.    WARFARIN (COUMADIN) 4 MG TABLET    Take 4 mg by mouth daily.  Modified Medications   No medications on file  Discontinued Medications   SPIRONOLACTONE (ALDACTONE) 25 MG TABLET    Take 0.5 tablets (12.5 mg total) by mouth daily for 30 days.     Physical Exam:  Vitals:   09/07/18 1131  BP: (!) 156/90  Pulse: 94  Temp: 98 F (36.7 C)  TempSrc: Oral  SpO2: 98%  Weight: 158 lb (71.7 kg)  Height: 5' 3" (1.6 m)   Body mass index is 27.99 kg/m.  Physical Exam Vitals signs reviewed.  Constitutional:      General: She is not in acute distress.    Appearance: She is overweight.  HENT:     Head: Normocephalic.     Mouth/Throat:     Mouth: Mucous membranes are moist.     Pharynx: Oropharynx is clear. No oropharyngeal exudate or posterior oropharyngeal erythema.  Eyes:     Pupils: Pupils are equal, round, and reactive to light.  Neck:     Musculoskeletal: Normal range of motion.  Cardiovascular:     Rate and Rhythm: Normal rate and regular rhythm.     Heart sounds: Normal heart sounds. No murmur. No friction rub. No gallop.   Pulmonary:     Effort: Pulmonary effort is normal.     Breath sounds: Normal breath sounds.  Abdominal:     General: Bowel sounds are normal.     Palpations: Abdomen is soft.  Musculoskeletal: Normal range of motion.        General: No swelling or tenderness.     Right lower leg: No edema.     Left lower leg: No edema.  Skin:    General: Skin is warm and dry.     Coloration: Skin is not pale.     Findings: No erythema or rash.  Neurological:     Mental Status: She is alert and oriented to person, place, and time.     Cranial Nerves: No cranial nerve deficit.     Sensory: No sensory deficit.      Coordination: Coordination normal.  Psychiatric:        Mood and Affect: Mood normal.        Behavior: Behavior normal.        Thought Content: Thought content normal.        Judgment: Judgment normal.     Labs reviewed: Basic Metabolic Panel: Recent Labs  10/02/17 1000 03/30/18 1456 07/20/18 1400  NA 136 135 134*  K 4.2 4.4 4.2  CL 102 99 100  CO2 _0 GLUCOSE 105* 99 108  BUN _1 CREATININE 0.70 0.77 0.83  CALCIUM 9.7 10.6* 10.2  TSH 0.72  --   --    Liver Function Tests: Recent Labs    10/02/17 1000 03/30/18 1456  AST 15 19  ALT 12 16  BILITOT 0.9 1.5*  PROT 6.8 7.4   No results for input(s): LIPASE, AMYLASE in the last 8760 hours. No results for input(s): AMMONIA in the last 8760 hours. CBC: Recent Labs    10/02/17 1000 03/30/18 1456  WBC 8.7 7.7  NEUTROABS 6,012 5,552  HGB 13.5 14.9  HCT 41.7 45.8*  MCV 88.5 86.3  PLT 353 361   Lipid Panel: Recent Labs    10/02/17 1000  CHOL 185  HDL 71  LDLCALC 101*  TRIG 50  CHOLHDL 2.6   TSH: Recent Labs    10/02/17 1000  TSH 0.72   A1C: Lab Results  Component Value Date   HGBA1C 5.7 (H) 03/30/2018     Assessment/Plan 1. Long term (current) use of anticoagulants - POC INR low at 1.9 goal 2.5-3.5  2. Essential hypertension, benign Remains elevated. Will increase hydralazine to 25 mg by mouth three times daily and may need to increase to 50 mg at next follow up. Continue dietary modifications.  - spironolactone (ALDACTONE) 25 MG tablet; Take 0.5 tablets (12.5 mg total) by mouth daily.  Dispense: 45 tablet; Refill: 1 - hydrALAZINE (APRESOLINE) 25 MG tablet; Take 1 tablet (25 mg total) by mouth 3 (three) times daily.  Dispense: 90 tablet; Refill: 0 - BMP with eGFR([facility]) - CBC with Differential/Platelet  3. H/O mitral valve replacement with mechanical valve -INR low at 1.9, will increase coumadin to 1.5 tablets 6 mg today and tomorrow, then to start 1 tablet (4 mg) by mouth daily  except 1.5 tablets (6 mg) on tuesdays.   4. Paroxysmal atrial fibrillation (HCC) Rate controlled, not currently on medication due to tolerability issue. Continues on coumadin, will increase dose to get back to therapeutic range (see number 3)  Next appt: 2 weeks for INR and HTN. Carlos American. [place], [facility] [phone]

## 2018-09-07 NOTE — Patient Instructions (Addendum)
coumadin 4 mg tablet- today and tomorrow INCREASE  to take 6 mg (1.5 tablet) then start 4 mg (1 tablet) daily except 6 mg (1.5 tablet) on Tuesday)  Increase hydralazine to 25 by mouth THREE TIMES DAILY  Continue spironolactone.   Follow up in 2 weeks for INR and blood pressure check

## 2018-09-10 ENCOUNTER — Encounter: Payer: Self-pay | Admitting: Nurse Practitioner

## 2018-09-20 ENCOUNTER — Encounter: Payer: Self-pay | Admitting: Nurse Practitioner

## 2018-09-21 ENCOUNTER — Ambulatory Visit (INDEPENDENT_AMBULATORY_CARE_PROVIDER_SITE_OTHER): Payer: Medicare Other | Admitting: Nurse Practitioner

## 2018-09-21 ENCOUNTER — Other Ambulatory Visit: Payer: Self-pay

## 2018-09-21 ENCOUNTER — Encounter: Payer: Self-pay | Admitting: Nurse Practitioner

## 2018-09-21 VITALS — BP 172/88 | HR 82 | Temp 98.2°F

## 2018-09-21 DIAGNOSIS — I480 Paroxysmal atrial fibrillation: Secondary | ICD-10-CM

## 2018-09-21 DIAGNOSIS — Z7901 Long term (current) use of anticoagulants: Secondary | ICD-10-CM

## 2018-09-21 DIAGNOSIS — Z952 Presence of prosthetic heart valve: Secondary | ICD-10-CM | POA: Diagnosis not present

## 2018-09-21 DIAGNOSIS — I10 Essential (primary) hypertension: Secondary | ICD-10-CM

## 2018-09-21 LAB — POCT INR: INR: 2.3 (ref 2.0–3.0)

## 2018-09-21 MED ORDER — HYDRALAZINE HCL 50 MG PO TABS: 50.0000 mg | ORAL_TABLET | Freq: Three times a day (TID) | ORAL | 2 refills | Status: DC

## 2018-09-21 NOTE — Progress Notes (Signed)
Careteam: Patient Care Team: [name], NP as PCP - General (Geriatric Medicine)  Advanced Directive information    Allergies  Allergen Reactions  . Carvedilol Itching  . Ace Inhibitors Other (See Comments)    Reaction (??)  . Amlodipine Swelling  . Hydrochlorothiazide Other (See Comments)    Hyponatremia   . Metoprolol Other (See Comments)    Dizziness     Chief Complaint  Patient presents with  . Coagulation Disorder    INR check     HPI: Patient is a 78 y.o. female for INR and blood pressure check.  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  Due to low INR at last visit coumadin was increased to 4 mg daily except 6 mg on tuesdays.  Missed last thursdays medication.  No changes in medication or diet.  No bleeding or bruising. No shortness of breath, chest pains, swelling.   htn- blood pressure has been elevated therefore at last OV hydralazine was increased to 25 mg TID from BID. Following low sodium diet. Was taking blood pressure at home and has been high at home as well. Tolerating hydralazine, no side effects noted. Denies headache, dizziness, blurred vision, chest pains.  Review of Systems:  Review of Systems  Constitutional: Negative for chills, fever, malaise/fatigue and weight loss.  Respiratory: Negative for cough, sputum production and shortness of breath.   Cardiovascular: Negative for chest pain and palpitations.  Musculoskeletal: Negative for myalgias.  Neurological: Negative for dizziness, tingling and headaches.  Endo/Heme/Allergies: Negative for environmental allergies. Does not bruise/bleed easily.    Past Medical History:  Diagnosis Date  . Broken hip ([place]) 10/2011   left  . Broken wrist   . Depression   . Diarrhea   . H/O long-term (current) use of anticoagulants   . High cholesterol   . Hypertension   . Memory difficulty 01/18/2016   . 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 Osteoporosis   . SIADH (syndrome of inappropriate ADH production) ([place])   . Urinary retention    chronic  . Vertigo 01/18/2016   Past Surgical History:  Procedure Laterality Date  . HEMATOMA EVACUATION Left 11/25/2012   Procedure: EVACUATION HEMATOMA;  Surgeon: Linna Hoff, MD;  Location: [facility];  Service: Orthopedics;  Laterality: Left;  . HIP PINNING,CANNULATED Left 11/03/2012   Procedure: CANNULATED HIP PINNING;  Surgeon: Tobi Bastos, MD;  Location: [facility];  Service: Orthopedics;  Laterality: Left;  . IR INJECT/THERA/INC NEEDLE/CATH/PLC EPI/LUMB/SAC W/IMG  07/20/2017  . 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  . OPEN REDUCTION INTERNAL FIXATION (ORIF) WRIST WITH ILIAC CREST BONE GRAFT Left 11/24/2012   Procedure: OPEN REDUCTION INTERNAL FIXATION LEFT WRIST POSSIBLE BONE GRAFTING AND PINNING (ORIF) ;  Surgeon: Linna Hoff, MD;  Location: [facility];  Service: Orthopedics;  Laterality: Left;  . SHOULDER OPEN ROTATOR CUFF REPAIR     Social History:   reports that she has never smoked. She has never used smokeless tobacco. She reports that she does not drink alcohol or use drugs.  Family History  Problem Relation Age of Onset  . Stroke Mother   . Hypertension Mother   . Stroke Father   . Colon cancer Paternal Grandmother   . Parkinsonism Brother     Medications: Patient's Medications  New Prescriptions   No  medications on file  Previous Medications   ALENDRONATE (FOSAMAX) 70 MG TABLET    Take 1 tablet (70 mg total) by mouth every Sunday. Take with a full glass of water on an empty stomach.   ASPIRIN EC 81 MG TABLET    Take 1 tablet (81 mg total) by mouth daily.   ATORVASTATIN (LIPITOR) 10 MG TABLET    Take 1 tablet (10 mg total) by mouth at bedtime.   HYDRALAZINE (APRESOLINE) 25 MG TABLET    Take 1 tablet (25 mg total) by mouth 3 (three) times daily.   OMEGA 3 1200 MG CAPS    Take 1 capsule by mouth 2 (two) times daily.    SPIRONOLACTONE (ALDACTONE) 25 MG TABLET    Take 0.5 tablets (12.5 mg total) by mouth daily.   TRAZODONE (DESYREL) 50 MG TABLET    Take 50 mg by mouth at bedtime as needed.    WARFARIN (COUMADIN) 4 MG TABLET    4 mg  (1 tablet) daily except Tuesdays to take 6 mg (1.5 tablets)  Modified Medications   No medications on file  Discontinued Medications   No medications on file     Physical Exam:  Vitals:   09/21/18 1314  BP: (!) 172/88  Pulse: 82  Temp: 98.2 F (36.8 C)  SpO2: 97%   There is no height or weight on file to calculate BMI.  Physical Exam Vitals signs reviewed.  Constitutional:      General: She is not in acute distress.    Appearance: She is overweight.  HENT:     Head: Normocephalic.     Mouth/Throat:     Mouth: Mucous membranes are moist.     Pharynx: Oropharynx is clear. No oropharyngeal exudate or posterior oropharyngeal erythema.  Eyes:     Pupils: Pupils are equal, round, and reactive to light.  Neck:     Musculoskeletal: Normal range of motion.  Cardiovascular:     Rate and Rhythm: Normal rate and regular rhythm.     Heart sounds: Normal heart sounds. No murmur. No friction rub. No gallop.   Pulmonary:     Effort: Pulmonary effort is normal.     Breath sounds: Normal breath sounds.  Abdominal:     General: Bowel sounds are normal.     Palpations: Abdomen is soft.  Musculoskeletal: Normal range of motion.        General: No swelling or tenderness.     Right lower leg: No edema.     Left lower leg: No edema.  Skin:    General: Skin is warm and dry.     Coloration: Skin is not pale.     Findings: No erythema or rash.  Neurological:     Mental Status: She is alert and oriented to person, place, and time.     Cranial Nerves: No cranial nerve deficit.     Sensory: No sensory deficit.     Coordination: Coordination normal.  Psychiatric:        Mood and Affect: Mood normal.        Behavior: Behavior normal.        Thought Content: Thought content  normal.        Judgment: Judgment normal.     Labs reviewed: Basic Metabolic Panel: Recent Labs    10/02/17 1000 03/30/18 1456 07/20/18 1400 09/07/18 1208  NA 136 135 134* 136  K 4.2 4.4 4.2 4.1  CL 102 99 100 102  CO2 27 27 23  26  GLUCOSE 105* 99 108 116  BUN 12 18 18 17   CREATININE 0.70 0.77 0.83 0.73  CALCIUM 9.7 10.6* 10.2 10.1  TSH 0.72  --   --   --    Liver Function Tests: Recent Labs    10/02/17 1000 03/30/18 1456  AST 15 19  ALT 12 16  BILITOT 0.9 1.5*  PROT 6.8 7.4   No results for input(s): LIPASE, AMYLASE in the last 8760 hours. No results for input(s): AMMONIA in the last 8760 hours. CBC: Recent Labs    10/02/17 1000 03/30/18 1456 09/07/18 1208  WBC 8.7 7.7 7.4  NEUTROABS 6,012 5,552 5,513  HGB 13.5 14.9 15.3  HCT 41.7 45.8* 46.7*  MCV 88.5 86.3 87.5  PLT 353 361 345   Lipid Panel: Recent Labs    10/02/17 1000  CHOL 185  HDL 71  LDLCALC 101*  TRIG 50  CHOLHDL 2.6   TSH: Recent Labs    10/02/17 1000  TSH 0.72   A1C: Lab Results  Component Value Date   HGBA1C 5.7 (H) 03/30/2018     Assessment/Plan 1. Essential hypertension, benign Not at goal. Will increase hydralazine to 50 mg TID, continue aldactone 25 mg daily with dietary modifications,  - hydrALAZINE (APRESOLINE) 50 MG tablet; Take 1 tablet (50 mg total) by mouth 3 (three) times daily.  Dispense: 90 tablet; Refill: 2  2. Long term (current) use of anticoagulants - POC INR 2.3 today, goal 2.5-3.5 however missed dose recently. Will continue current regimen 4 mg daily except 6 mg on Tuesday and follow up in 2 weeks.   3. H/O mitral valve replacement with mechanical valve Stable, requiring anticoagulation with coumadin, goal 2.5-3.5  4. Paroxysmal atrial fibrillation (HCC) Rate controlled, continue on coumadin for anticoagulation.   Next appt: 10/05/2018 Carlos American. [place], [facility] [phone]

## 2018-09-21 NOTE — Patient Instructions (Signed)
INCREASE hydralazine to 50 mg three times daily  To take 2 of the 25 tablet until refill is needed then you will start 50 mg tablet.   Since you missed dose on Thursday lets continue the same coumadin instructions. To take coumadin 4 mg daily except to take 6 mg on tuesdays   Follow up INR and blood pressure in 2 weeks

## 2018-09-23 ENCOUNTER — Encounter: Payer: Self-pay | Admitting: Nurse Practitioner

## 2018-09-28 ENCOUNTER — Ambulatory Visit: Payer: Medicare Other | Admitting: Nurse Practitioner

## 2018-09-29 ENCOUNTER — Encounter: Payer: Self-pay | Admitting: Nurse Practitioner

## 2018-10-05 ENCOUNTER — Encounter: Payer: Self-pay | Admitting: Nurse Practitioner

## 2018-10-05 ENCOUNTER — Telehealth: Payer: Self-pay

## 2018-10-05 ENCOUNTER — Ambulatory Visit (INDEPENDENT_AMBULATORY_CARE_PROVIDER_SITE_OTHER): Payer: Medicare Other | Admitting: Nurse Practitioner

## 2018-10-05 ENCOUNTER — Ambulatory Visit: Payer: Medicare Other | Admitting: Nurse Practitioner

## 2018-10-05 ENCOUNTER — Other Ambulatory Visit: Payer: Self-pay

## 2018-10-05 VITALS — BP 142/80 | HR 90 | Temp 97.8°F | Ht 63.0 in | Wt 160.2 lb

## 2018-10-05 DIAGNOSIS — Z7901 Long term (current) use of anticoagulants: Secondary | ICD-10-CM

## 2018-10-05 DIAGNOSIS — Z952 Presence of prosthetic heart valve: Secondary | ICD-10-CM | POA: Diagnosis not present

## 2018-10-05 DIAGNOSIS — F5101 Primary insomnia: Secondary | ICD-10-CM

## 2018-10-05 DIAGNOSIS — I10 Essential (primary) hypertension: Secondary | ICD-10-CM | POA: Diagnosis not present

## 2018-10-05 DIAGNOSIS — I480 Paroxysmal atrial fibrillation: Secondary | ICD-10-CM

## 2018-10-05 LAB — POCT INR: INR: 3.6 — AB (ref 2.0–3.0)

## 2018-10-05 NOTE — Telephone Encounter (Signed)
Incoming fax received from [employer]: Order expired, cologuard order was cancelled because it has an inactive order and has exceeded 365 days from initial order.  I called patient to question why she never completed cologuard. Per patient she received kit and decided she did not wish to perform test. Patient sent unused kit back to [employer].   Message sent to [name], NP as a Juluis Rainier.  Patient was also informed that her visit for Thursday can not be completed today due to time restraint. Patient was ok with that.

## 2018-10-05 NOTE — Progress Notes (Signed)
Careteam: Patient Care Team: [name], NP as PCP - General (Geriatric Medicine)  Advanced Directive information    Allergies  Allergen Reactions  . Carvedilol Itching  . Ace Inhibitors Other (See Comments)    Reaction (??)  . Amlodipine Swelling  . Hydrochlorothiazide Other (See Comments)    Hyponatremia   . Metoprolol Other (See Comments)    Dizziness     Chief Complaint  Patient presents with  . Anticoagulation    PT/INR (3.6), no missed doses, no bleeding, and no major diet changes. Blood pressure check  . Sleeping Problem    Patient c/o trouble falling asleep      HPI: Patient is a 78 y.o. female seen in the office today for INR and blood pressure check.  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 she missed a dose prior to last INR and here today to recheck Current dose coumadin 4 mg daily except 6 mg on tuesdays.  Previously on 4 mg daily but INR was too low.  No changes in medication or diet.  No bleeding or bruising. No shortness of breath, chest pains, swelling.  htn- blood pressure has been elevated therefore at last OV hydralazine was increased to 50 mg TID from 25 mg TID. Following low sodium diet.  Denies headache, dizziness, blurred vision, chest pains. Does not check blood pressure at home. In office today blood pressure much better.   Having trouble sleeping- just recently started.  Feels like she is "getting old"  Going to bed around 11 but did not go to sleep for a while. Stayed up until 3 am a few nights ago. Denies any increase in anxiety. Denies depression.  Gets up around 10 am-11am. Wants to sleep in. No naps.  Reports she walks 1/2-1 mile a day.  Sits around a lot during the day now "nothing to do anymore"  Drinks a cup of coffee in the morning and that's it in regards to caffeine.  Eats chocolate before bed.   Review of Systems:   Review of Systems  Constitutional: Negative for chills, fever, malaise/fatigue and weight loss.  Respiratory: Negative for cough, sputum production and shortness of breath.   Cardiovascular: Negative for chest pain and palpitations.  Musculoskeletal: Negative for myalgias.  Neurological: Negative for dizziness, tingling and headaches.  Endo/Heme/Allergies: Negative for environmental allergies. Does not bruise/bleed easily.  Psychiatric/Behavioral: Negative for depression. The patient has insomnia. The patient is not nervous/anxious.     Past Medical History:  Diagnosis Date  . Broken hip ([place]) 10/2011   left  . Broken wrist   . Depression   . Diarrhea   . H/O long-term (current) use of anticoagulants   . High cholesterol   . Hypertension   . Memory difficulty 01/18/2016  . 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 Osteoporosis   . SIADH (syndrome of inappropriate ADH production) ([place])   . Urinary retention    chronic  . Vertigo 01/18/2016   Past Surgical History:  Procedure Laterality Date  . HEMATOMA EVACUATION Left 11/25/2012   Procedure: EVACUATION HEMATOMA;  Surgeon: Linna Hoff, MD;  Location: [facility];  Service: Orthopedics;  Laterality: Left;  . HIP PINNING,CANNULATED Left 11/03/2012   Procedure: CANNULATED HIP PINNING;  Surgeon: Tobi Bastos, MD;  [facility];  Service: Orthopedics;  Laterality: Left;  . IR INJECT/THERA/INC NEEDLE/CATH/PLC  EPI/LUMB/SAC W/IMG  07/20/2017  . 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  . OPEN REDUCTION INTERNAL FIXATION (ORIF) WRIST WITH ILIAC CREST BONE GRAFT Left 11/24/2012   Procedure: OPEN REDUCTION INTERNAL FIXATION LEFT WRIST POSSIBLE BONE GRAFTING AND PINNING (ORIF) ;  Surgeon: Linna Hoff, MD;  Location: [facility];  Service: Orthopedics;  Laterality: Left;  . SHOULDER OPEN ROTATOR CUFF REPAIR     Social History:   reports that she has never smoked. She has never used smokeless tobacco. She reports that she does not  drink alcohol or use drugs.  Family History  Problem Relation Age of Onset  . Stroke Mother   . Hypertension Mother   . Stroke Father   . Colon cancer Paternal Grandmother   . Parkinsonism Brother     Medications: Patient's Medications  New Prescriptions   No medications on file  Previous Medications   ALENDRONATE (FOSAMAX) 70 MG TABLET    Take 1 tablet (70 mg total) by mouth every Sunday. Take with a full glass of water on an empty stomach.   ASPIRIN EC 81 MG TABLET    Take 1 tablet (81 mg total) by mouth daily.   ATORVASTATIN (LIPITOR) 10 MG TABLET    Take 1 tablet (10 mg total) by mouth at bedtime.   HYDRALAZINE (APRESOLINE) 50 MG TABLET    Take 1 tablet (50 mg total) by mouth 3 (three) times daily.   SPIRONOLACTONE (ALDACTONE) 25 MG TABLET    Take 0.5 tablets (12.5 mg total) by mouth daily.   TRAZODONE (DESYREL) 50 MG TABLET    Take 50 mg by mouth at bedtime as needed.    WARFARIN (COUMADIN) 4 MG TABLET    4 mg  (1 tablet) daily except Tuesdays to take 6 mg (1.5 tablets)  Modified Medications   No medications on file  Discontinued Medications   OMEGA 3 1200 MG CAPS    Take 1 capsule by mouth 2 (two) times daily.     Physical Exam:  Vitals:   10/05/18 1126  BP: (!) 142/80  Pulse: 90  Temp: 97.8 F (36.6 C)  TempSrc: Oral  SpO2: 97%  Weight: 160 lb 3.2 oz (72.7 kg)  Height: 5\' 3"  (1.6 m)   Body mass index is 28.38 kg/m.  Physical Exam Vitals signs reviewed.  Constitutional:      General: She is not in acute distress.    Appearance: She is overweight.  HENT:     Head: Normocephalic.     Mouth/Throat:     Mouth: Mucous membranes are moist.     Pharynx: Oropharynx is clear. No oropharyngeal exudate or posterior oropharyngeal erythema.  Eyes:     Pupils: Pupils are equal, round, and reactive to light.  Neck:     Musculoskeletal: Normal range of motion.  Cardiovascular:     Rate and Rhythm: Normal rate and regular rhythm.     Heart sounds: Normal heart  sounds. No murmur. No friction rub. No gallop.   Pulmonary:     Effort: Pulmonary effort is normal.     Breath sounds: Normal breath sounds.  Abdominal:     General: Bowel sounds are normal.     Palpations: Abdomen is soft.  Musculoskeletal: Normal range of motion.        General: No swelling or tenderness.     Right lower leg: No edema.     Left lower leg: No edema.  Skin:    General: Skin  is warm and dry.     Coloration: Skin is not pale.     Findings: No erythema or rash.  Neurological:     Mental Status: She is alert and oriented to person, place, and time.     Cranial Nerves: No cranial nerve deficit.     Sensory: No sensory deficit.     Coordination: Coordination normal.  Psychiatric:        Mood and Affect: Mood normal.        Behavior: Behavior normal.        Thought Content: Thought content normal.        Judgment: Judgment normal.    Labs reviewed: Basic Metabolic Panel: Recent Labs    03/30/18 1456 07/20/18 1400 09/07/18 1208  NA 135 134* 136  K 4.4 4.2 4.1  CL 99 100 102  CO2 27 23 26   GLUCOSE 99 108 116  BUN 18 18 17   CREATININE 0.77 0.83 0.73  CALCIUM 10.6* 10.2 10.1   Liver Function Tests: Recent Labs    03/30/18 1456  AST 19  ALT 16  BILITOT 1.5*  PROT 7.4   No results for input(s): LIPASE, AMYLASE in the last 8760 hours. No results for input(s): AMMONIA in the last 8760 hours. CBC: Recent Labs    03/30/18 1456 09/07/18 1208  WBC 7.7 7.4  NEUTROABS 5,552 5,513  HGB 14.9 15.3  HCT 45.8* 46.7*  MCV 86.3 87.5  PLT 361 345   Lipid Panel: No results for input(s): CHOL, HDL, LDLCALC, TRIG, CHOLHDL, LDLDIRECT in the last 8760 hours. TSH: No results for input(s): TSH in the last 8760 hours. A1C: Lab Results  Component Value Date   HGBA1C 5.7 (H) 03/30/2018     Assessment/Plan 1. Long term (current) use of anticoagulants - POC INR 3.6, goal 2.5-3.5. will continue current regimen at this time of coumadin 4 mg daily except 6 mg on  Tuesday. Encouraged compliance. with Vit K intake.   2. Essential hypertension, benign Improved control with hydralazine 50 mg by mouth three times daily. Continues on aldactone   3. H/O mitral valve replacement with mechanical valve Stable, continue on coumadin for anticoagulation.   4. Paroxysmal atrial fibrillation (HCC) Stable, rate controlled without medication. Continues on coumadin for anticoagulation.   5. Primary insomnia -information provided on lifestyle modifications.  -to use trazodone 50 mg by mouth nightly as needed -may add low dose melatonin if lifestyle changes not effective however she is not wanting to get up until 10-11 am therefore may not fall asleep right away. She is also less active due to quarantine with COVID-19 which is likely contributing.   Next appt: 2 week INR.  Carlos American. [place], [facility] [phone]

## 2018-10-05 NOTE — Patient Instructions (Addendum)
Follow up in 2 weeks for INR continue coumadin 4 mg daily except 6 mg on Tuesday   lifestyle modifications for sleep.  Get up earlier Increase activity No chocolate in the evening.  Only get in bed sleep  Can use MELATONIN 3-6 mg by mouth nightly.   Insomnia Insomnia is a sleep disorder that makes it difficult to fall asleep or stay asleep. Insomnia can cause fatigue, low energy, difficulty concentrating, mood swings, and poor performance at work or school. There are three different ways to classify insomnia:  Difficulty falling asleep.  Difficulty staying asleep.  Waking up too early in the morning. Any type of insomnia can be long-term (chronic) or short-term (acute). Both are common. Short-term insomnia usually lasts for three months or less. Chronic insomnia occurs at least three times a week for longer than three months. What are the causes? Insomnia may be caused by another condition, situation, or substance, such as:  Anxiety.  Certain medicines.  Gastroesophageal reflux disease (GERD) or other gastrointestinal conditions.  Asthma or other breathing conditions.  Restless legs syndrome, sleep apnea, or other sleep disorders.  Chronic pain.  Menopause.  Stroke.  Abuse of alcohol, tobacco, or illegal drugs.  Mental health conditions, such as depression.  Caffeine.  Neurological disorders, such as Alzheimer's disease.  An overactive thyroid (hyperthyroidism). Sometimes, the cause of insomnia may not be known. What increases the risk? Risk factors for insomnia include:  Gender. Women are affected more often than men.  Age. Insomnia is more common as you get older.  Stress.  Lack of exercise.  Irregular work schedule or working night shifts.  Traveling between different time zones.  Certain medical and mental health conditions. What are the signs or symptoms? If you have insomnia, the main symptom is having trouble falling asleep or having trouble  staying asleep. This may lead to other symptoms, such as:  Feeling fatigued or having low energy.  Feeling nervous about going to sleep.  Not feeling rested in the morning.  Having trouble concentrating.  Feeling irritable, anxious, or depressed. How is this diagnosed? This condition may be diagnosed based on:  Your symptoms and medical history. Your health care provider may ask about: ? Your sleep habits. ? Any medical conditions you have. ? Your mental health.  A physical exam. How is this treated? Treatment for insomnia depends on the cause. Treatment may focus on treating an underlying condition that is causing insomnia. Treatment may also include:  Medicines to help you sleep.  Counseling or therapy.  Lifestyle adjustments to help you sleep better. Follow these instructions at home: Eating and drinking   Limit or avoid alcohol, caffeinated beverages, and cigarettes, especially close to bedtime. These can disrupt your sleep.  Do not eat a large meal or eat spicy foods right before bedtime. This can lead to digestive discomfort that can make it hard for you to sleep. Sleep habits   Keep a sleep diary to help you and your health care provider figure out what could be causing your insomnia. Write down: ? When you sleep. ? When you wake up during the night. ? How well you sleep. ? How rested you feel the next day. ? Any side effects of medicines you are taking. ? What you eat and drink.  Make your bedroom a dark, comfortable place where it is easy to fall asleep. ? Put up shades or blackout curtains to block light from outside. ? Use a white noise machine to block noise. ?  Keep the temperature cool.  Limit screen use before bedtime. This includes: ? Watching TV. ? Using your smartphone, tablet, or computer.  Stick to a routine that includes going to bed and waking up at the same times every day and night. This can help you fall asleep faster. Consider making a  quiet activity, such as reading, part of your nighttime routine.  Try to avoid taking naps during the day so that you sleep better at night.  Get out of bed if you are still awake after 15 minutes of trying to sleep. Keep the lights down, but try reading or doing a quiet activity. When you feel sleepy, go back to bed. General instructions  Take over-the-counter and prescription medicines only as told by your health care provider.  Exercise regularly, as told by your health care provider. Avoid exercise starting several hours before bedtime.  Use relaxation techniques to manage stress. Ask your health care provider to suggest some techniques that may work well for you. These may include: ? Breathing exercises. ? Routines to release muscle tension. ? Visualizing peaceful scenes.  Make sure that you drive carefully. Avoid driving if you feel very sleepy.  Keep all follow-up visits as told by your health care provider. This is important. Contact a health care provider if:  You are tired throughout the day.  You have trouble in your daily routine due to sleepiness.  You continue to have sleep problems, or your sleep problems get worse. Get help right away if:  You have serious thoughts about hurting yourself or someone else. If you ever feel like you may hurt yourself or others, or have thoughts about taking your own life, get help right away. You can go to your nearest emergency department or call:  Your local emergency services (911 in the [place]  A suicide crisis helpline, such as the [facility] at 828-072-5919. This is open 24 hours a day. Summary  Insomnia is a sleep disorder that makes it difficult to fall asleep or stay asleep.  Insomnia can be long-term (chronic) or short-term (acute).  Treatment for insomnia depends on the cause. Treatment may focus on treating an underlying condition that is causing insomnia.  Keep a sleep diary to help you  and your health care provider figure out what could be causing your insomnia. This information is not intended to replace advice given to you by your health care provider. Make sure you discuss any questions you have with your health care provider. Document Released: 05/09/2000 Document Revised: 02/19/2017 Document Reviewed: 02/19/2017 Elsevier Interactive Patient Education  2019 [employer].

## 2018-10-06 ENCOUNTER — Encounter: Payer: Medicare Other | Admitting: Family

## 2018-10-06 ENCOUNTER — Encounter: Payer: Self-pay | Admitting: Nurse Practitioner

## 2018-10-06 ENCOUNTER — Other Ambulatory Visit: Payer: Self-pay

## 2018-10-06 ENCOUNTER — Ambulatory Visit (INDEPENDENT_AMBULATORY_CARE_PROVIDER_SITE_OTHER): Payer: Medicare Other | Admitting: Nurse Practitioner

## 2018-10-06 ENCOUNTER — Ambulatory Visit: Payer: Medicare Other

## 2018-10-06 DIAGNOSIS — Z0000 Encounter for general adult medical examination without abnormal findings: Secondary | ICD-10-CM | POA: Diagnosis not present

## 2018-10-06 MED ORDER — TETANUS-DIPHTH-ACELL PERTUSSIS 5-2.5-18.5 LF-MCG/0.5 IM SUSP: 0.5000 mL | Freq: Once | INTRAMUSCULAR | 0 refills | Status: AC

## 2018-10-06 MED ORDER — ZOSTER VAC RECOMB ADJUVANTED 50 MCG/0.5ML IM SUSR: 0.5000 mL | Freq: Once | INTRAMUSCULAR | 1 refills | Status: AC

## 2018-10-06 NOTE — Progress Notes (Signed)
    This service is provided via telemedicine  No vital signs collected/recorded due to the encounter was a telemedicine visit.   Location of patient (ex: home, work): Home  Patient consents to a telephone visit:  Yes  Location of the provider (ex: office, home):  [facility], Office   Name of any referring provider:  N/A  Names of all persons participating in the telemedicine service and their role in the encounter:  S.Chrae B/CMA, Sherrie Mustache, NP, and Patient   Time spent on call:  6 min with medical assistant

## 2018-10-06 NOTE — Patient Instructions (Signed)
Jodi Frank , Thank you for taking time to come for your Medicare Wellness Visit. I appreciate your ongoing commitment to your health goals. Please review the following plan we discussed and let me know if I can assist you in the future.   Screening recommendations/referrals: Colonoscopy declines Mammogram aged out Bone Density please call and schedule bone density at [facility] [phone] to make appt Recommended yearly ophthalmology/optometry visit for glaucoma screening and checkup Recommended yearly dental visit for hygiene and checkup  Vaccinations: Influenza vaccine Due 12/2018 Pneumococcal vaccine up to date Tdap vaccine - sent to pharmacy Shingles vaccine -sent to pharmacy    Advanced directives: on file.   Conditions/risks identified: none.   Next appointment: 1 year.    Preventive Care 27 Years and Older, Female Preventive care refers to lifestyle choices and visits with your health care provider that can promote health and wellness. What does preventive care include?  A yearly physical exam. This is also called an annual well check.  Dental exams once or twice a year.  Routine eye exams. Ask your health care provider how often you should have your eyes checked.  Personal lifestyle choices, including:  Daily care of your teeth and gums.  Regular physical activity.  Eating a healthy diet.  Avoiding tobacco and drug use.  Limiting alcohol use.  Practicing safe sex.  Taking low-dose aspirin every day.  Taking vitamin and mineral supplements as recommended by your health care provider. What happens during an annual well check? The services and screenings done by your health care provider during your annual well check will depend on your age, overall health, lifestyle risk factors, and family history of disease. Counseling  Your health care provider may ask you questions about your:  Alcohol use.  Tobacco use.  Drug use.   Emotional well-being.  Home and relationship well-being.  Sexual activity.  Eating habits.  History of falls.  Memory and ability to understand (cognition).  Work and work Statistician.  Reproductive health. Screening  You may have the following tests or measurements:  Height, weight, and BMI.  Blood pressure.  Lipid and cholesterol levels. These may be checked every 5 years, or more frequently if you are over 49 years old.  Skin check.  Lung cancer screening. You may have this screening every year starting at age 89 if you have a 30-pack-year history of smoking and currently smoke or have quit within the past 15 years.  Fecal occult blood test (FOBT) of the stool. You may have this test every year starting at age 37.  Flexible sigmoidoscopy or colonoscopy. You may have a sigmoidoscopy every 5 years or a colonoscopy every 10 years starting at age 77.  Hepatitis C blood test.  Hepatitis B blood test.  Sexually transmitted disease (STD) testing.  Diabetes screening. This is done by checking your blood sugar (glucose) after you have not eaten for a while (fasting). You may have this done every 1-3 years.  Bone density scan. This is done to screen for osteoporosis. You may have this done starting at age 27.  Mammogram. This may be done every 1-2 years. Talk to your health care provider about how often you should have regular mammograms. Talk with your health care provider about your test results, treatment options, and if necessary, the need for more tests. Vaccines  Your health care provider may recommend certain vaccines, such as:  Influenza vaccine. This is recommended every year.  Tetanus, diphtheria, and acellular pertussis (  Tdap, Td) vaccine. You may need a Td booster every 10 years.  Zoster vaccine. You may need this after age 12.  Pneumococcal 13-valent conjugate (PCV13) vaccine. One dose is recommended after age 22.  Pneumococcal polysaccharide (PPSV23)  vaccine. One dose is recommended after age 83. Talk to your health care provider about which screenings and vaccines you need and how often you need them. This information is not intended to replace advice given to you by your health care provider. Make sure you discuss any questions you have with your health care provider. Document Released: 06/08/2015 Document Revised: 01/30/2016 Document Reviewed: 03/13/2015 Elsevier Interactive Patient Education  2017 [place] Prevention in the Home Falls can cause injuries. They can happen to people of all ages. There are many things you can do to make your home safe and to help prevent falls. What can I do on the outside of my home?  Regularly fix the edges of walkways and driveways and fix any cracks.  Remove anything that might make you trip as you walk through a door, such as a raised step or threshold.  Trim any bushes or trees on the path to your home.  Use bright outdoor lighting.  Clear any walking paths of anything that might make someone trip, such as rocks or tools.  Regularly check to see if handrails are loose or broken. Make sure that both sides of any steps have handrails.  Any raised decks and porches should have guardrails on the edges.  Have any leaves, snow, or ice cleared regularly.  Use sand or salt on walking paths during winter.  Clean up any spills in your garage right away. This includes oil or grease spills. What can I do in the bathroom?  Use night lights.  Install grab bars by the toilet and in the tub and shower. Do not use towel bars as grab bars.  Use non-skid mats or decals in the tub or shower.  If you need to sit down in the shower, use a plastic, non-slip stool.  Keep the floor dry. Clean up any water that spills on the floor as soon as it happens.  Remove soap buildup in the tub or shower regularly.  Attach bath mats securely with double-sided non-slip rug tape.  Do not have throw rugs  and other things on the floor that can make you trip. What can I do in the bedroom?  Use night lights.  Make sure that you have a light by your bed that is easy to reach.  Do not use any sheets or blankets that are too big for your bed. They should not hang down onto the floor.  Have a firm chair that has side arms. You can use this for support while you get dressed.  Do not have throw rugs and other things on the floor that can make you trip. What can I do in the kitchen?  Clean up any spills right away.  Avoid walking on wet floors.  Keep items that you use a lot in easy-to-reach places.  If you need to reach something above you, use a strong step stool that has a grab bar.  Keep electrical cords out of the way.  Do not use floor polish or wax that makes floors slippery. If you must use wax, use non-skid floor wax.  Do not have throw rugs and other things on the floor that can make you trip. What can I do with my stairs?  Do  not leave any items on the stairs.  Make sure that there are handrails on both sides of the stairs and use them. Fix handrails that are broken or loose. Make sure that handrails are as long as the stairways.  Check any carpeting to make sure that it is firmly attached to the stairs. Fix any carpet that is loose or worn.  Avoid having throw rugs at the top or bottom of the stairs. If you do have throw rugs, attach them to the floor with carpet tape.  Make sure that you have a light switch at the top of the stairs and the bottom of the stairs. If you do not have them, ask someone to add them for you. What else can I do to help prevent falls?  Wear shoes that:  Do not have high heels.  Have rubber bottoms.  Are comfortable and fit you well.  Are closed at the toe. Do not wear sandals.  If you use a stepladder:  Make sure that it is fully opened. Do not climb a closed stepladder.  Make sure that both sides of the stepladder are locked into  place.  Ask someone to hold it for you, if possible.  Clearly mark and make sure that you can see:  Any grab bars or handrails.  First and last steps.  Where the edge of each step is.  Use tools that help you move around (mobility aids) if they are needed. These include:  Canes.  Walkers.  Scooters.  Crutches.  Turn on the lights when you go into a dark area. Replace any light bulbs as soon as they burn out.  Set up your furniture so you have a clear path. Avoid moving your furniture around.  If any of your floors are uneven, fix them.  If there are any pets around you, be aware of where they are.  Review your medicines with your doctor. Some medicines can make you feel dizzy. This can increase your chance of falling. Ask your doctor what other things that you can do to help prevent falls. This information is not intended to replace advice given to you by your health care provider. Make sure you discuss any questions you have with your health care provider. Document Released: 03/08/2009 Document Revised: 10/18/2015 Document Reviewed: 06/16/2014 Elsevier Interactive Patient Education  2017 [employer].

## 2018-10-06 NOTE — Progress Notes (Signed)
Subjective:   Jodi Frank is a 78 y.o. female who presents for Medicare Annual (Subsequent) preventive examination.  Review of Systems:   Cardiac Risk Factors include: advanced age (>71men, >26 women);family history of premature cardiovascular disease;hypertension;dyslipidemia;sedentary lifestyle     Objective:     Vitals: There were no vitals taken for this visit.  There is no height or weight on file to calculate BMI.  Advanced Directives 10/06/2018 03/30/2018 03/02/2018 11/23/2017 11/09/2017 10/02/2017 07/12/2017  Does Patient Have a Medical Advance Directive? Yes Yes Yes Yes Yes Yes Yes  Type of Paramedic of [place];Living will [place];Living will [place];Living will [place];Living will [place];Living will [place];Living will Living will  Does patient want to make changes to medical advance directive? No - Guardian declined - - - - No - Patient declined No - Patient declined  Copy of [place] in Chart? Yes - validated most recent copy scanned in chart (See row information) Yes - validated most recent copy scanned in chart (See row information) Yes Yes Yes Yes -  Would patient like information on creating a medical advance directive? - - - - - - -  Pre-existing out of facility DNR order (yellow form or pink MOST form) - - - - - - -    Tobacco Social History   Tobacco Use  Smoking Status Never Smoker  Smokeless Tobacco Never Used     Counseling given: Not Answered   Clinical Intake:  Pre-visit preparation completed: Yes  Pain : No/denies pain     BMI - recorded: 28.38 Nutritional Status: BMI 25 -29 Overweight Nutritional Risks: None Diabetes: No  How often do you need to have someone help you when you read instructions, pamphlets, or other written materials from your doctor or pharmacy?: 1 - Never What is the last grade  level you completed in school?: 4 years of college, BS in psychology and zoology   Interpreter Needed?: No     Past Medical History:  Diagnosis Date  . Broken hip ([place]) 10/2011   left  . Broken wrist   . Depression   . Diarrhea   . H/O long-term (current) use of anticoagulants   . High cholesterol   . Hypertension   . Memory difficulty 01/18/2016  . 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 Osteoporosis   . SIADH (syndrome of inappropriate ADH production) ([place])   . Urinary retention    chronic  . Vertigo 01/18/2016   Past Surgical History:  Procedure Laterality Date  . HEMATOMA EVACUATION Left 11/25/2012   Procedure: EVACUATION HEMATOMA;  Surgeon: Linna Hoff, MD;  Location: [facility];  Service: Orthopedics;  Laterality: Left;  . HIP PINNING,CANNULATED Left 11/03/2012   Procedure: CANNULATED HIP PINNING;  Surgeon: Tobi Bastos, MD;  Location: [facility];  Service: Orthopedics;  Laterality: Left;  . IR INJECT/THERA/INC NEEDLE/CATH/PLC EPI/LUMB/SAC W/IMG  07/20/2017  . 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  . OPEN REDUCTION INTERNAL FIXATION (ORIF) WRIST WITH ILIAC CREST BONE GRAFT Left 11/24/2012   Procedure: OPEN REDUCTION INTERNAL FIXATION LEFT WRIST POSSIBLE BONE GRAFTING AND PINNING (ORIF) ;  Surgeon: Linna Hoff, MD;  Location: [facility];  Service: Orthopedics;  Laterality: Left;  . SHOULDER OPEN ROTATOR CUFF REPAIR     Family History  Problem Relation Age of Onset  . Stroke Mother   .  Hypertension Mother   . Stroke Father   . Colon cancer Paternal Grandmother   . Parkinsonism Brother    Social History   Socioeconomic History  . Marital status: Divorced    Spouse name: Not on file  . Number of children: 1  . Years of education: BS  . Highest education level: Not on file  Occupational History  . Occupation: Retired  Scientific laboratory technician  . Financial resource strain: Not hard at all  . Food insecurity:    Worry: Never true    Inability: Never true  .  Transportation needs:    Medical: No    Non-medical: No  Tobacco Use  . Smoking status: Never Smoker  . Smokeless tobacco: Never Used  Substance and Sexual Activity  . Alcohol use: No    Frequency: Never  . Drug use: No  . Sexual activity: Not Currently  Lifestyle  . Physical activity:    Days per week: 0 days    Minutes per session: 0 min  . Stress: Not at all  Relationships  . Social connections:    Talks on phone: Twice a week    Gets together: Twice a week    Attends religious service: Never    Active member of club or organization: No    Attends meetings of clubs or organizations: Never    Relationship status: Divorced  Other Topics Concern  . Not on file  Social History Narrative   Lives at home, alone   Right-handed   Caffeine: 2 cups per day    Outpatient Encounter Medications as of 10/06/2018  Medication Sig  . alendronate (FOSAMAX) 70 MG tablet Take 1 tablet (70 mg total) by mouth every Sunday. Take with a full glass of water on an empty stomach.  Marland Kitchen aspirin EC 81 MG tablet Take 1 tablet (81 mg total) by mouth daily.  Marland Kitchen atorvastatin (LIPITOR) 10 MG tablet Take 1 tablet (10 mg total) by mouth at bedtime.  . hydrALAZINE (APRESOLINE) 50 MG tablet Take 1 tablet (50 mg total) by mouth 3 (three) times daily.  Marland Kitchen spironolactone (ALDACTONE) 25 MG tablet Take 0.5 tablets (12.5 mg total) by mouth daily.  . traZODone (DESYREL) 50 MG tablet Take 50 mg by mouth at bedtime as needed.   . warfarin (COUMADIN) 4 MG tablet 4 mg  (1 tablet) daily except Tuesdays to take 6 mg (1.5 tablets)  . Zoster Vaccine Adjuvanted [facility]) injection Inject 0.5 mLs into the muscle once for 1 dose.  . [DISCONTINUED] Zoster Vaccine Adjuvanted [facility]) injection Inject 0.5 mLs into the muscle once.   No facility-administered encounter medications on file as of 10/06/2018.     Activities of Daily Living In your present state of health, do you have any difficulty performing the following  activities: 10/06/2018  Hearing? N  Vision? N  Difficulty concentrating or making decisions? N  Walking or climbing stairs? N  Dressing or bathing? N  Doing errands, shopping? N  Preparing Food and eating ? N  Using the Toilet? N  In the past six months, have you accidently leaked urine? N  Do you have problems with loss of bowel control? N  Managing your Medications? N  Managing your Finances? N  Housekeeping or managing your Housekeeping? N  Some recent data might be hidden    Patient Care Team: [name], NP as PCP - General (Geriatric Medicine)    Assessment:   This is a routine wellness examination for Jodi Frank.  Exercise Activities  and Dietary recommendations Current Exercise Habits: Home exercise routine, Type of exercise: walking, Time (Minutes): 30, Frequency (Times/Week): 7, Weekly Exercise (Minutes/Week): 210, Intensity: Moderate  Goals    . Patient Stated     Maintain level of health       Fall Risk Fall Risk  10/06/2018 10/05/2018 08/09/2018 07/28/2018 07/20/2018  Falls in the past year? 0 0 0 0 0  Number falls in past yr: 0 - 0 0 0  Injury with Fall? 0 0 0 0 0   Is the patient's home free of loose throw rugs in walkways, pet beds, electrical cords, etc?   yes      Grab bars in the bathroom? yes      Handrails on the stairs?   yes      Adequate lighting?   yes  Timed Get Up and Go performed: na  Depression Screen PHQ 2/9 Scores 10/06/2018 01/15/2018 10/02/2017 05/12/2017  PHQ - 2 Score 0 0 0 0     Cognitive Function MMSE - Mini Mental State Exam 10/02/2017 01/18/2016  Orientation to time 5 5  Orientation to Place 5 5  Registration 3 3  Attention/ Calculation 5 5  Recall 3 2  Language- name 2 objects 2 2  Language- repeat 1 1  Language- follow 3 step command 3 3  Language- read & follow direction 1 1  Write a sentence 1 1  Copy design 1 1  Total score 30 29     6CIT Screen 10/06/2018  What Year? 0 points  What month? 0 points  What time? 0  points  Count back from 20 0 points  Months in reverse 0 points  Repeat phrase 0 points  Total Score 0    Immunization History  Administered Date(s) Administered  . Influenza, High Dose Seasonal PF 02/17/2014, 02/14/2015  . Influenza, Seasonal, Injecte, Preservative Fre 02/14/2016  . Influenza,inj,Quad PF,6+ Mos 02/10/2018  . Influenza-Unspecified 02/23/2013, 02/26/2017  . Pneumococcal Conjugate-13 08/27/2013  . Pneumococcal Polysaccharide-23 05/03/2007, 09/26/2009, 02/17/2014  . Td 06/26/2005, 07/09/2005  . Tetanus 06/26/2005, 07/09/2005  . Zoster 08/19/2013    Qualifies for Shingles Vaccine?yes  Screening Tests Health Maintenance  Topic Date Due  . COLONOSCOPY  03/31/2019 (Originally 09/20/2015)  . TETANUS/TDAP  06/02/2028 (Originally 07/10/2015)  . INFLUENZA VACCINE  12/25/2018  . DEXA SCAN  Completed  . PNA vac Low Risk Adult  Completed    Cancer Screenings: Lung: Low Dose CT Chest recommended if Age 19-80 years, 30 pack-year currently smoking OR have quit w/in 15years. Patient does not qualify. Breast:  Up to date on Mammogram? Yes   Up to date of Bone Density/Dexa? No Colorectal: declines any further screenings  Additional Screenings: Hepatitis C Screening: declines.     Plan:      I have personally reviewed and noted the following in the patient's chart:   . Medical and social history . Use of alcohol, tobacco or illicit drugs  . Current medications and supplements . Functional ability and status . Nutritional status . Physical activity . Advanced directives . List of other physicians . Hospitalizations, surgeries, and ER visits in previous 12 months . Vitals . Screenings to include cognitive, depression, and falls . Referrals and appointments  In addition, I have reviewed and discussed with patient certain preventive protocols, quality metrics, and best practice recommendations. A written personalized care plan for preventive services as well as  general preventive health recommendations were provided to patient.     [name],  NP  10/06/2018

## 2018-10-06 NOTE — Addendum Note (Signed)
Addended by: Logan Bores on: 10/06/2018 01:46 PM   Modules accepted: Orders

## 2018-10-07 ENCOUNTER — Encounter: Payer: Medicare Other | Admitting: Nurse Practitioner

## 2018-10-07 ENCOUNTER — Ambulatory Visit: Payer: Medicare Other | Admitting: Nurse Practitioner

## 2018-10-18 ENCOUNTER — Other Ambulatory Visit: Payer: Self-pay | Admitting: Nurse Practitioner

## 2018-10-18 DIAGNOSIS — Z952 Presence of prosthetic heart valve: Secondary | ICD-10-CM

## 2018-10-18 DIAGNOSIS — Z7901 Long term (current) use of anticoagulants: Secondary | ICD-10-CM

## 2018-10-18 DIAGNOSIS — I480 Paroxysmal atrial fibrillation: Secondary | ICD-10-CM

## 2018-10-19 ENCOUNTER — Other Ambulatory Visit: Payer: Self-pay

## 2018-10-19 ENCOUNTER — Ambulatory Visit (INDEPENDENT_AMBULATORY_CARE_PROVIDER_SITE_OTHER): Payer: Medicare Other | Admitting: Nurse Practitioner

## 2018-10-19 VITALS — BP 138/92 | HR 88 | Temp 98.3°F | Ht 63.0 in | Wt 158.0 lb

## 2018-10-19 DIAGNOSIS — R42 Dizziness and giddiness: Secondary | ICD-10-CM

## 2018-10-19 DIAGNOSIS — Z952 Presence of prosthetic heart valve: Secondary | ICD-10-CM

## 2018-10-19 DIAGNOSIS — E782 Mixed hyperlipidemia: Secondary | ICD-10-CM | POA: Diagnosis not present

## 2018-10-19 DIAGNOSIS — R739 Hyperglycemia, unspecified: Secondary | ICD-10-CM | POA: Diagnosis not present

## 2018-10-19 DIAGNOSIS — M810 Age-related osteoporosis without current pathological fracture: Secondary | ICD-10-CM | POA: Diagnosis not present

## 2018-10-19 DIAGNOSIS — F5101 Primary insomnia: Secondary | ICD-10-CM | POA: Diagnosis not present

## 2018-10-19 DIAGNOSIS — I480 Paroxysmal atrial fibrillation: Secondary | ICD-10-CM

## 2018-10-19 DIAGNOSIS — I10 Essential (primary) hypertension: Secondary | ICD-10-CM

## 2018-10-19 DIAGNOSIS — Z7901 Long term (current) use of anticoagulants: Secondary | ICD-10-CM

## 2018-10-19 LAB — POCT INR: INR: 3.2 — AB (ref 2.0–3.0)

## 2018-10-19 MED ORDER — ALENDRONATE SODIUM 70 MG PO TABS: 70.0000 mg | ORAL_TABLET | ORAL | 1 refills | Status: DC

## 2018-10-19 MED ORDER — WARFARIN SODIUM 4 MG PO TABS: ORAL_TABLET | ORAL | Status: DC

## 2018-10-19 NOTE — Patient Instructions (Signed)
Continue current dose of coumadin.  Continue current blood pressure medication with diet modifications

## 2018-10-19 NOTE — Progress Notes (Signed)
Careteam: Patient Care Team: [name], NP as PCP - General (Geriatric Medicine)  Advanced Directive information Does Patient Have a Medical Advance Directive?: Yes, Type of Advance Directive: [place];Living will, Does patient want to make changes to medical advance directive?: No - Patient declined  Allergies  Allergen Reactions  . Carvedilol Itching  . Ace Inhibitors Other (See Comments)    Reaction (??)  . Amlodipine Swelling  . Hydrochlorothiazide Other (See Comments)    Hyponatremia   . Metoprolol Other (See Comments)    Dizziness     Chief Complaint  Patient presents with  . Medical Management of Chronic Issues    6 month follow-up, patient with blood pressure log to review   . Anticoagulation    2 week PT/INR check, no missed doses, no bleeding, no major diet changes   . Medication Refill    Refill Coumadin      HPI: Patient is a 78 y.o. female seen in the office today routine follow up and INR check  INR today3.2--- INR at last check was3.6her goal iswith goal 2.5-3.5 for Afib post MVR in 1990 2/2 MR she has a mechanical valve.   htn- home bp ranging from 130-167/76-107. No chest pains, shortness of breath, leg swelling.   Hyperlipidemia- not fasting today. Lipids due. Continues on lipitor 10 mg daily.  Osteoposis- continues on fosamax. Need follow up bone density. Not taking calcium or vit D, reports she does walk daily  Insomnia- improved, continues on trazodone PRN, uses melatonin as well which has helped.   Vertigo- none at this time.     Review of Systems:  Review of Systems  Constitutional: Negative for chills, fever, malaise/fatigue and weight loss.  Respiratory: Negative for cough, sputum production and shortness of breath.   Cardiovascular: Negative for chest pain and palpitations.  Gastrointestinal: Negative for abdominal pain, constipation and diarrhea.  Genitourinary: Negative for dysuria, frequency and  urgency.  Musculoskeletal: Negative for myalgias.  Neurological: Negative for dizziness, tingling and headaches.  Endo/Heme/Allergies: Negative for environmental allergies. Does not bruise/bleed easily.  Psychiatric/Behavioral: Negative for depression. The patient is not nervous/anxious and does not have insomnia.     Past Medical History:  Diagnosis Date  . Broken hip ([place]) 10/2011   left  . Broken wrist   . Depression   . Diarrhea   . H/O long-term (current) use of anticoagulants   . High cholesterol   . Hypertension   . Memory difficulty 01/18/2016  . 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 Osteoporosis   . SIADH (syndrome of inappropriate ADH production) ([place])   . Urinary retention    chronic  . Vertigo 01/18/2016   Past Surgical History:  Procedure Laterality Date  . HEMATOMA EVACUATION Left 11/25/2012   Procedure: EVACUATION HEMATOMA;  Surgeon: Linna Hoff, MD;  Location: [facility];  Service: Orthopedics;  Laterality: Left;  . HIP PINNING,CANNULATED Left 11/03/2012   Procedure: CANNULATED HIP PINNING;  Surgeon: Tobi Bastos, MD;  Location[facility];  Service: Orthopedics;  Laterality: Left;  . IR INJECT/THERA/INC NEEDLE/CATH/PLC EPI/LUMB/SAC W/IMG  07/20/2017  . 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  . OPEN REDUCTION INTERNAL FIXATION (ORIF) WRIST WITH ILIAC CREST BONE GRAFT Left 11/24/2012   Procedure: OPEN REDUCTION INTERNAL FIXATION LEFT WRIST POSSIBLE BONE GRAFTING AND PINNING (ORIF) ;  Surgeon: Linna Hoff, MD;  Location: [facility];  Service: Orthopedics;  Laterality: Left;  .  SHOULDER OPEN ROTATOR CUFF REPAIR     Social History:   reports that she has never smoked. She has never used smokeless tobacco. She reports that she does not drink alcohol or use drugs.  Family History  Problem Relation Age of Onset  . Stroke Mother   . Hypertension Mother   . Stroke Father   . Colon cancer Paternal Grandmother   . Parkinsonism Brother     Medications:  Patient's Medications  New Prescriptions   No medications on file  Previous Medications   ALENDRONATE (FOSAMAX) 70 MG TABLET    Take 1 tablet (70 mg total) by mouth every Sunday. Take with a full glass of water on an empty stomach.   ASPIRIN EC 81 MG TABLET    Take 1 tablet (81 mg total) by mouth daily.   ATORVASTATIN (LIPITOR) 10 MG TABLET    Take 1 tablet (10 mg total) by mouth at bedtime.   HYDRALAZINE (APRESOLINE) 50 MG TABLET    Take 1 tablet (50 mg total) by mouth 3 (three) times daily.   SPIRONOLACTONE (ALDACTONE) 25 MG TABLET    Take 0.5 tablets (12.5 mg total) by mouth daily.   TRAZODONE (DESYREL) 50 MG TABLET    Take 50 mg by mouth at bedtime as needed.    WARFARIN (COUMADIN) 4 MG TABLET    4 mg  (1 tablet) daily except Tuesdays to take 6 mg (1.5 tablets)  Modified Medications   No medications on file  Discontinued Medications   No medications on file     Physical Exam:  Vitals:   10/19/18 1130  BP: (!) 138/92  Pulse: 88  Temp: 98.3 F (36.8 C)  TempSrc: Oral  SpO2: 98%  Weight: 158 lb (71.7 kg)  Height: 5\' 3"  (1.6 m)   Body mass index is 27.99 kg/m.  Physical Exam Vitals signs reviewed.  Constitutional:      General: She is not in acute distress.    Appearance: She is overweight.  HENT:     Head: Normocephalic.     Mouth/Throat:     Mouth: Mucous membranes are moist.     Pharynx: Oropharynx is clear. No oropharyngeal exudate or posterior oropharyngeal erythema.  Eyes:     Pupils: Pupils are equal, round, and reactive to light.  Neck:     Musculoskeletal: Normal range of motion.  Cardiovascular:     Rate and Rhythm: Normal rate and regular rhythm.     Heart sounds: Normal heart sounds. No murmur. No friction rub. No gallop.   Pulmonary:     Effort: Pulmonary effort is normal.     Breath sounds: Normal breath sounds.  Abdominal:     General: Bowel sounds are normal.     Palpations: Abdomen is soft.  Musculoskeletal: Normal range of motion.         General: No swelling or tenderness.     Right lower leg: No edema.     Left lower leg: No edema.  Skin:    General: Skin is warm and dry.     Coloration: Skin is not pale.     Findings: No erythema or rash.  Neurological:     Mental Status: She is alert and oriented to person, place, and time.     Cranial Nerves: No cranial nerve deficit.     Sensory: No sensory deficit.     Coordination: Coordination normal.  Psychiatric:        Mood and Affect: Mood normal.  Behavior: Behavior normal.        Thought Content: Thought content normal.        Judgment: Judgment normal.     Labs reviewed: Basic Metabolic Panel: Recent Labs    03/30/18 1456 07/20/18 1400 09/07/18 1208  NA 135 134* 136  K 4.4 4.2 4.1  CL 99 100 102  CO2 27 23 26   GLUCOSE 99 108 116  BUN 18 18 17   CREATININE 0.77 0.83 0.73  CALCIUM 10.6* 10.2 10.1   Liver Function Tests: Recent Labs    03/30/18 1456  AST 19  ALT 16  BILITOT 1.5*  PROT 7.4   No results for input(s): LIPASE, AMYLASE in the last 8760 hours. No results for input(s): AMMONIA in the last 8760 hours. CBC: Recent Labs    03/30/18 1456 09/07/18 1208  WBC 7.7 7.4  NEUTROABS 5,552 5,513  HGB 14.9 15.3  HCT 45.8* 46.7*  MCV 86.3 87.5  PLT 361 345   Lipid Panel: No results for input(s): CHOL, HDL, LDLCALC, TRIG, CHOLHDL, LDLDIRECT in the last 8760 hours. TSH: No results for input(s): TSH in the last 8760 hours. A1C: Lab Results  Component Value Date   HGBA1C 5.7 (H) 03/30/2018     Assessment/Plan 1. Long term (current) use of anticoagulants - POC INR 3.2 without missed doses, no recent changes in medication or diet. Will continue current regimen.  - warfarin (COUMADIN) 4 MG tablet; 4 mg  (1 tablet) daily except Tuesdays to take 6 mg (1.5 tablets)  2. Essential hypertension, benign Stable, has tolerability issues to a lot of medication. Currently doing well on hydralazine and spironolactone. Continue DASH diet.   3. Age  related osteoporosis, unspecified pathological fracture presence -to schedule follow up bone density.  - alendronate (FOSAMAX) 70 MG tablet; Take 1 tablet (70 mg total) by mouth every Sunday. Take with a full glass of water on an empty stomach. Dispense: 12 tablet; Refill: 1 -continues weight bearing activity. Recommended to take calcium with D 600/400 twice a day.   4. Primary insomnia Has improved with melatonin, uses trazodone PRN.   5. Vertigo Resolved at this time, no recurrent esisodes  6. H/O mitral valve replacement with mechanical valve INR of 3.2 which is at goal 2.5-3.5 - warfarin (COUMADIN) 4 MG tablet; 4 mg  (1 tablet) daily except Tuesdays to take 6 mg (1.5 tablets)  7. Mixed hyperlipidemia -continues on lipitor, not fasting today but would like blood work done.  - Lipid Panel  8. Hyperglycemia -discussed dietary modifications, will follow up A1c - Hemoglobin A1c  9. Paroxysmal atrial fibrillation (HCC) -rate controlled, could not tolerate rate controlling medications. Continues on coumadin. INR at goal.  - warfarin (COUMADIN) 4 MG tablet; 4 mg  (1 tablet) daily except Tuesdays to take 6 mg (1.5 tablets)  Next appt: 4 weeks for INR Reyn Faivre K. [place], [facility] [phone]

## 2018-10-20 ENCOUNTER — Encounter: Payer: Self-pay | Admitting: Nurse Practitioner

## 2018-10-20 LAB — LIPID PANEL
Cholesterol: 175 mg/dL (ref <200)
HDL: 76 mg/dL (ref >=50)
LDL Cholesterol (Calc): 85 mg/dL (calc)
Non-HDL Cholesterol (Calc): 99 mg/dL (calc) (ref <130)
Total CHOL/HDL Ratio: 2.3 (calc) (ref <5.0)
Triglycerides: 60 mg/dL (ref <150)

## 2018-10-20 LAB — HEMOGLOBIN A1C
Hgb A1c MFr Bld: 5.4 % of total Hgb (ref <5.7)
Mean Plasma Glucose: 108 (calc)
eAG (mmol/L): 6 (calc)

## 2018-11-16 ENCOUNTER — Encounter: Payer: Self-pay | Admitting: Nurse Practitioner

## 2018-11-16 ENCOUNTER — Other Ambulatory Visit: Payer: Self-pay

## 2018-11-16 ENCOUNTER — Ambulatory Visit (INDEPENDENT_AMBULATORY_CARE_PROVIDER_SITE_OTHER): Payer: Medicare Other | Admitting: Nurse Practitioner

## 2018-11-16 VITALS — BP 148/92 | HR 88 | Temp 98.2°F | Ht 63.0 in | Wt 161.0 lb

## 2018-11-16 DIAGNOSIS — I480 Paroxysmal atrial fibrillation: Secondary | ICD-10-CM

## 2018-11-16 DIAGNOSIS — M810 Age-related osteoporosis without current pathological fracture: Secondary | ICD-10-CM

## 2018-11-16 DIAGNOSIS — Z952 Presence of prosthetic heart valve: Secondary | ICD-10-CM | POA: Diagnosis not present

## 2018-11-16 DIAGNOSIS — Z7901 Long term (current) use of anticoagulants: Secondary | ICD-10-CM | POA: Diagnosis not present

## 2018-11-16 LAB — POCT INR: INR: 2.9 (ref 2.0–3.0)

## 2018-11-16 NOTE — Patient Instructions (Addendum)
Need to update your bone density exam Please call and schedule the [facility] center in [place], [place] in: [facility] Address: [address], [place], [place] [address] Phone: [phone]  Recommended to take caltrate with D 600/400 twice a day with weight bearing exercise.   Continue current warfarin 4 mg daily except 6 mg on Tuesday

## 2018-11-16 NOTE — Progress Notes (Signed)
Careteam: Patient Care Team: [name], NP as PCP - General (Geriatric Medicine)  Advanced Directive information    Allergies  Allergen Reactions  . Carvedilol Itching  . Ace Inhibitors Other (See Comments)    Reaction (??)  . Amlodipine Swelling  . Hydrochlorothiazide Other (See Comments)    Hyponatremia   . Metoprolol Other (See Comments)    Dizziness     Chief Complaint  Patient presents with  . Anticoagulation    PT/INR check, no missed doses, do bleeding, and no diet changes   . Medication Management    Discuss any recommended vitamins for patient      HPI: Patient is a 78 y.o. female seen in the office today for INR.    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  She has not missed any doses, no diet changes, no new medication.  Denies chest pains, shortness of breath, swelling, bruising or bleeding.   Review of Systems:  Review of Systems  Constitutional: Negative for chills, fever, malaise/fatigue and weight loss.  Respiratory: Negative for cough, sputum production and shortness of breath.   Cardiovascular: Negative for chest pain and palpitations.  Gastrointestinal: Negative for abdominal pain, constipation and diarrhea.  Genitourinary: Negative for dysuria, frequency and urgency.  Musculoskeletal: Negative for myalgias.  Neurological: Negative for dizziness, tingling and headaches.  Endo/Heme/Allergies: Negative for environmental allergies. Does not bruise/bleed easily.  Psychiatric/Behavioral: Negative for depression. The patient is not nervous/anxious and does not have insomnia.     Past Medical History:  Diagnosis Date  . Broken hip ([place]) 10/2011   left  . Broken wrist   . Depression   . Diarrhea   . H/O long-term (current) use of anticoagulants   . High cholesterol   . Hypertension   . Memory difficulty 01/18/2016  . 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 Osteoporosis   .  SIADH (syndrome of inappropriate ADH production) ([place])   . Urinary retention    chronic  . Vertigo 01/18/2016   Past Surgical History:  Procedure Laterality Date  . HEMATOMA EVACUATION Left 11/25/2012   Procedure: EVACUATION HEMATOMA;  Surgeon: Linna Hoff, MD;  Location: [facility];  Service: Orthopedics;  Laterality: Left;  . HIP PINNING,CANNULATED Left 11/03/2012   Procedure: CANNULATED HIP PINNING;  Surgeon: Tobi Bastos, MD;  [facility];  Service: Orthopedics;  Laterality: Left;  . IR INJECT/THERA/INC NEEDLE/CATH/PLC EPI/LUMB/SAC W/IMG  07/20/2017  . 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  . OPEN REDUCTION INTERNAL FIXATION (ORIF) WRIST WITH ILIAC CREST BONE GRAFT Left 11/24/2012   Procedure: OPEN REDUCTION INTERNAL FIXATION LEFT WRIST POSSIBLE BONE GRAFTING AND PINNING (ORIF) ;  Surgeon: Linna Hoff, MD;  Location: [facility];  Service: Orthopedics;  Laterality: Left;  . SHOULDER OPEN ROTATOR CUFF REPAIR     Social History:   reports that she has never smoked. She has never used smokeless tobacco. She reports that she does not drink alcohol or use drugs.  Family History  Problem Relation Age of Onset  . Stroke Mother   . Hypertension Mother   . Stroke Father   . Colon cancer Paternal Grandmother   . Parkinsonism Brother     Medications: Patient's Medications  New Prescriptions   No medications on file  Previous Medications   ALENDRONATE (FOSAMAX) 70 MG TABLET    Take 1 tablet (70 mg  total) by mouth every Sunday. Take with a full glass of water on an empty stomach.   ASPIRIN EC 81 MG TABLET    Take 1 tablet (81 mg total) by mouth daily.   ATORVASTATIN (LIPITOR) 10 MG TABLET    Take 1 tablet (10 mg total) by mouth at bedtime.   HYDRALAZINE (APRESOLINE) 50 MG TABLET    Take 1 tablet (50 mg total) by mouth 3 (three) times daily.   SPIRONOLACTONE (ALDACTONE) 25 MG TABLET    Take 0.5 tablets (12.5 mg total) by mouth daily.   TRAZODONE (DESYREL) 50 MG TABLET    Take 50  mg by mouth at bedtime as needed.    WARFARIN (COUMADIN) 4 MG TABLET    4 mg  (1 tablet) daily except Tuesdays to take 6 mg (1.5 tablets)  Modified Medications   No medications on file  Discontinued Medications   WARFARIN (COUMADIN) 4 MG TABLET    TAKE ONE TABLET BY MOUTH DAILY EXCEPT TUESDAYS AND FRIDAYS. TAKE ONE AND ONE-HALF (1 & 1/2) TABLET BY MOUTH DAILY ON TUESDAY AND FRIDAY    Physical Exam:  Vitals:   11/16/18 1334  BP: (!) 148/92  Pulse: 88  Temp: 98.2 F (36.8 C)  TempSrc: Oral  SpO2: 97%  Weight: 161 lb (73 kg)  Height: 5\' 3"  (1.6 m)   Body mass index is 28.52 kg/m. Wt Readings from Last 3 Encounters:  11/16/18 161 lb (73 kg)  10/19/18 158 lb (71.7 kg)  10/05/18 160 lb 3.2 oz (72.7 kg)    Physical Exam Vitals signs reviewed.  Constitutional:      General: She is not in acute distress.    Appearance: She is overweight.  HENT:     Head: Normocephalic.  Eyes:     Pupils: Pupils are equal, round, and reactive to light.  Neck:     Musculoskeletal: Normal range of motion.  Cardiovascular:     Rate and Rhythm: Normal rate. Rhythm irregular.     Heart sounds: Murmur present. No friction rub. No gallop.   Pulmonary:     Effort: Pulmonary effort is normal.     Breath sounds: Normal breath sounds.  Abdominal:     General: Bowel sounds are normal.     Palpations: Abdomen is soft.  Musculoskeletal: Normal range of motion.        General: No swelling or tenderness.     Right lower leg: No edema.     Left lower leg: No edema.  Skin:    General: Skin is warm and dry.     Coloration: Skin is not pale.     Findings: No erythema or rash.  Neurological:     Mental Status: She is alert and oriented to person, place, and time.     Cranial Nerves: No cranial nerve deficit.     Sensory: No sensory deficit.     Coordination: Coordination normal.  Psychiatric:        Mood and Affect: Mood normal.        Behavior: Behavior normal.        Thought Content: Thought  content normal.        Judgment: Judgment normal.     Labs reviewed: Basic Metabolic Panel: Recent Labs    03/30/18 1456 07/20/18 1400 09/07/18 1208  NA 135 134* 136  K 4.4 4.2 4.1  CL 99 100 102  CO2 27 23 26   GLUCOSE 99 108 116  BUN 18 18 17   CREATININE  0.77 0.83 0.73  CALCIUM 10.6* 10.2 10.1   Liver Function Tests: Recent Labs    03/30/18 1456  AST 19  ALT 16  BILITOT 1.5*  PROT 7.4   No results for input(s): LIPASE, AMYLASE in the last 8760 hours. No results for input(s): AMMONIA in the last 8760 hours. CBC: Recent Labs    03/30/18 1456 09/07/18 1208  WBC 7.7 7.4  NEUTROABS 5,552 5,513  HGB 14.9 15.3  HCT 45.8* 46.7*  MCV 86.3 87.5  PLT 361 345   Lipid Panel: Recent Labs    10/19/18 1203  CHOL 175  HDL 76  LDLCALC 85  TRIG 60  CHOLHDL 2.3   TSH: No results for input(s): TSH in the last 8760 hours. A1C: Lab Results  Component Value Date   HGBA1C 5.4 10/19/2018     Assessment/Plan 1. Long term (current) use of anticoagulants - POC INR at goal 2.9, will continue current dose of coumadin 4 mg daily execept 6 mg on tuesday  2. Paroxysmal atrial fibrillation (HCC) controlled. Not on any rate controlling medication due to intolerance. Continues on coumadin for anticoagulation.   3. Age related osteoporosis, unspecified pathological fracture presence -number provided to schedule bone density. Recommended to take caltrate with D 600/400 twice a day with weight bearing activity. -continues on fosamax "for many years". Based on bone density will most likely need to transition to another medication.  4. H/O mitral valve replacement with mechanical valve Stable, continues on coumadin for anticoagulation GOAL 2.5-3.5  Next appt: 4 weeks for INR.  Carlos American. [place], [facility] [phone]

## 2018-11-25 ENCOUNTER — Telehealth: Payer: Self-pay | Admitting: *Deleted

## 2018-11-25 MED ORDER — MECLIZINE HCL 25 MG PO TABS: 25.0000 mg | ORAL_TABLET | Freq: Three times a day (TID) | ORAL | 0 refills | Status: AC | PRN

## 2018-11-25 NOTE — Telephone Encounter (Signed)
Faxed request asking for refill of Meclizine 25 mg tablet, take one tablet by mouth daily as needed for dizziness Medication not on active med list, please advise.

## 2018-11-25 NOTE — Telephone Encounter (Signed)
Okay to approve meclizine 25 mg by mouth every 8 hours as needed dizziness #30/0 refills

## 2018-12-05 ENCOUNTER — Other Ambulatory Visit: Payer: Self-pay | Admitting: Nurse Practitioner

## 2018-12-05 DIAGNOSIS — Z7901 Long term (current) use of anticoagulants: Secondary | ICD-10-CM

## 2018-12-05 DIAGNOSIS — Z952 Presence of prosthetic heart valve: Secondary | ICD-10-CM

## 2018-12-05 DIAGNOSIS — I480 Paroxysmal atrial fibrillation: Secondary | ICD-10-CM

## 2018-12-16 ENCOUNTER — Other Ambulatory Visit: Payer: Self-pay

## 2018-12-16 ENCOUNTER — Ambulatory Visit (INDEPENDENT_AMBULATORY_CARE_PROVIDER_SITE_OTHER): Payer: Medicare Other | Admitting: Nurse Practitioner

## 2018-12-16 ENCOUNTER — Encounter: Payer: Self-pay | Admitting: Nurse Practitioner

## 2018-12-16 VITALS — BP 144/86 | HR 79 | Temp 98.2°F | Ht 63.0 in | Wt 161.0 lb

## 2018-12-16 DIAGNOSIS — Z7901 Long term (current) use of anticoagulants: Secondary | ICD-10-CM

## 2018-12-16 DIAGNOSIS — I480 Paroxysmal atrial fibrillation: Secondary | ICD-10-CM | POA: Diagnosis not present

## 2018-12-16 DIAGNOSIS — Z952 Presence of prosthetic heart valve: Secondary | ICD-10-CM

## 2018-12-16 LAB — POCT INR: INR: 3.3 — AB (ref 2.0–3.0)

## 2018-12-16 NOTE — Progress Notes (Signed)
Careteam: Patient Care Team: [name], NP as PCP - General (Geriatric Medicine)  Advanced Directive information    Allergies  Allergen Reactions  . Carvedilol Itching  . Ace Inhibitors Other (See Comments)    Reaction (??)  . Amlodipine Swelling  . Hydrochlorothiazide Other (See Comments)    Hyponatremia   . Metoprolol Other (See Comments)    Dizziness     Chief Complaint  Patient presents with  . Anticoagulation    1 month PT/INR check, no missed doses of blood thinner, 3.3 today      HPI: Patient is a 78 y.o. female seen in the office today for INR follow up.  INR today 3.3, goal 2.5-3.5  Hx of a fib s/p MVR in 1990 2/2 MR with valve on chronic coumadin. No changes in medication or diet.  No bruising or bleeding noted. No edema, shortness of breath, chest pains noted.   Review of Systems:  Review of Systems  Constitutional: Negative for chills, fever, malaise/fatigue and weight loss.  Respiratory: Negative for cough, hemoptysis, sputum production and shortness of breath.   Cardiovascular: Negative for chest pain, palpitations and leg swelling.  Gastrointestinal: Negative for blood in stool and diarrhea.  Genitourinary: Negative for hematuria.  Musculoskeletal: Negative for myalgias.  Neurological: Negative for dizziness, tingling and headaches.  Endo/Heme/Allergies: Negative for environmental allergies. Does not bruise/bleed easily.    Past Medical History:  Diagnosis Date  . Broken hip ([place]) 10/2011   left  . Broken wrist   . Depression   . Diarrhea   . H/O long-term (current) use of anticoagulants   . High cholesterol   . Hypertension   . Memory difficulty 01/18/2016  . 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 Osteoporosis   . SIADH (syndrome of inappropriate ADH production) ([place])   . Urinary retention    chronic  . Vertigo 01/18/2016   Past Surgical History:  Procedure Laterality Date  . HEMATOMA EVACUATION Left 11/25/2012   Procedure: EVACUATION HEMATOMA;  Surgeon: Linna Hoff, MD;  Location: [facility];  Service: Orthopedics;  Laterality: Left;  . HIP PINNING,CANNULATED Left 11/03/2012   Procedure: CANNULATED HIP PINNING;  Surgeon: Tobi Bastos, MD;  [facility];  Service: Orthopedics;  Laterality: Left;  . IR INJECT/THERA/INC NEEDLE/CATH/PLC EPI/LUMB/SAC W/IMG  07/20/2017  . 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  . OPEN REDUCTION INTERNAL FIXATION (ORIF) WRIST WITH ILIAC CREST BONE GRAFT Left 11/24/2012   Procedure: OPEN REDUCTION INTERNAL FIXATION LEFT WRIST POSSIBLE BONE GRAFTING AND PINNING (ORIF) ;  Surgeon: Linna Hoff, MD;  Location: [facility];  Service: Orthopedics;  Laterality: Left;  . SHOULDER OPEN ROTATOR CUFF REPAIR     Social History:   reports that she has never smoked. She has never used smokeless tobacco. She reports that she does not drink alcohol or use drugs.  Family History  Problem Relation Age of Onset  . Stroke Mother   . Hypertension Mother   . Stroke Father   . Colon cancer Paternal Grandmother   . Parkinsonism Brother     Medications: Patient's Medications  New Prescriptions   No medications on file  Previous Medications   ALENDRONATE (FOSAMAX) 70 MG TABLET    Take 1 tablet (70 mg total) by mouth every Sunday. Take with a full glass of water on an empty stomach.   ASPIRIN EC 81 MG TABLET    Take 1 tablet (81 mg total)  by mouth daily.   ATORVASTATIN (LIPITOR) 10 MG TABLET    Take 1 tablet (10 mg total) by mouth at bedtime.   HYDRALAZINE (APRESOLINE) 50 MG TABLET    Take 1 tablet (50 mg total) by mouth 3 (three) times daily.   MECLIZINE (ANTIVERT) 25 MG TABLET    Take 1 tablet (25 mg total) by mouth every 8 (eight) hours as needed for dizziness.   SPIRONOLACTONE (ALDACTONE) 25 MG TABLET    Take 0.5 tablets (12.5 mg total) by mouth daily.   TRAZODONE (DESYREL) 50 MG TABLET    Take 50 mg by mouth at bedtime as needed.    WARFARIN (COUMADIN) 4 MG TABLET    TAKE ONE  TABLET BY MOUTH DAILY EXCEPT TUESDAYS TAKE ONE AND ONE-HALF (1 & 1/2)  Modified Medications   No medications on file  Discontinued Medications   No medications on file    Physical Exam:  Vitals:   12/16/18 1420  BP: (!) 144/86  Pulse: 79  Temp: 98.2 F (36.8 C)  TempSrc: Oral  SpO2: 97%  Weight: 161 lb (73 kg)  Height: 5\' 3"  (1.6 m)   Body mass index is 28.52 kg/m. Wt Readings from Last 3 Encounters:  12/16/18 161 lb (73 kg)  11/16/18 161 lb (73 kg)  10/19/18 158 lb (71.7 kg)    Physical Exam Vitals signs reviewed.  Constitutional:      General: She is not in acute distress.    Appearance: She is overweight.  HENT:     Head: Normocephalic.  Eyes:     Pupils: Pupils are equal, round, and reactive to light.  Neck:     Musculoskeletal: Normal range of motion.  Cardiovascular:     Rate and Rhythm: Normal rate. Rhythm irregular.     Heart sounds: Murmur present. No friction rub. No gallop.   Pulmonary:     Effort: Pulmonary effort is normal.     Breath sounds: Normal breath sounds.  Abdominal:     General: Bowel sounds are normal.     Palpations: Abdomen is soft.  Musculoskeletal: Normal range of motion.        General: No swelling or tenderness.     Right lower leg: No edema.     Left lower leg: No edema.  Skin:    General: Skin is warm and dry.     Coloration: Skin is not pale.     Findings: No erythema or rash.  Neurological:     Mental Status: She is alert and oriented to person, place, and time.     Cranial Nerves: No cranial nerve deficit.     Sensory: No sensory deficit.     Coordination: Coordination normal.  Psychiatric:        Mood and Affect: Mood normal.        Behavior: Behavior normal.        Thought Content: Thought content normal.        Judgment: Judgment normal.    Labs reviewed: Basic Metabolic Panel: Recent Labs    03/30/18 1456 07/20/18 1400 09/07/18 1208  NA 135 134* 136  K 4.4 4.2 4.1  CL 99 100 102  CO2 27 23 26    GLUCOSE 99 108 116  BUN 18 18 17   CREATININE 0.77 0.83 0.73  CALCIUM 10.6* 10.2 10.1   Liver Function Tests: Recent Labs    03/30/18 1456  AST 19  ALT 16  BILITOT 1.5*  PROT 7.4   No results for  input(s): LIPASE, AMYLASE in the last 8760 hours. No results for input(s): AMMONIA in the last 8760 hours. CBC: Recent Labs    03/30/18 1456 09/07/18 1208  WBC 7.7 7.4  NEUTROABS 5,552 5,513  HGB 14.9 15.3  HCT 45.8* 46.7*  MCV 86.3 87.5  PLT 361 345   Lipid Panel: Recent Labs    10/19/18 1203  CHOL 175  HDL 76  LDLCALC 85  TRIG 60  CHOLHDL 2.3   TSH: No results for input(s): TSH in the last 8760 hours. A1C: Lab Results  Component Value Date   HGBA1C 5.4 10/19/2018     Assessment/Plan 1. Long term (current) use of anticoagulants - POC INR 3.3 which is goal. Continues on coumadin 4 mg daily except 6 mg daily Tuesday   2. Paroxysmal atrial fibrillation (HCC) -rate controlled without medication (unable to tolerate due to side effects) continues on coumadin.  3. H/O mitral valve replacement with mechanical valve INR goal 2.5-3.5, currently at goal 3.3  Next appt: 4 weeks for INR Brittiny Levitz K. [place], [facility] [phone]

## 2018-12-16 NOTE — Patient Instructions (Signed)
Continue coumadin 4 mg daily except 6 mg on Tuesday

## 2019-01-03 ENCOUNTER — Other Ambulatory Visit: Payer: Self-pay | Admitting: Nurse Practitioner

## 2019-01-03 DIAGNOSIS — I480 Paroxysmal atrial fibrillation: Secondary | ICD-10-CM

## 2019-01-03 DIAGNOSIS — Z7901 Long term (current) use of anticoagulants: Secondary | ICD-10-CM

## 2019-01-03 DIAGNOSIS — I10 Essential (primary) hypertension: Secondary | ICD-10-CM

## 2019-01-03 DIAGNOSIS — Z952 Presence of prosthetic heart valve: Secondary | ICD-10-CM

## 2019-01-17 ENCOUNTER — Other Ambulatory Visit: Payer: Self-pay

## 2019-01-17 ENCOUNTER — Ambulatory Visit (INDEPENDENT_AMBULATORY_CARE_PROVIDER_SITE_OTHER): Payer: Medicare Other | Admitting: Nurse Practitioner

## 2019-01-17 ENCOUNTER — Encounter: Payer: Self-pay | Admitting: Nurse Practitioner

## 2019-01-17 VITALS — BP 144/86 | HR 86 | Temp 97.5°F | Ht 63.0 in | Wt 157.0 lb

## 2019-01-17 DIAGNOSIS — I480 Paroxysmal atrial fibrillation: Secondary | ICD-10-CM | POA: Diagnosis not present

## 2019-01-17 DIAGNOSIS — F5101 Primary insomnia: Secondary | ICD-10-CM

## 2019-01-17 DIAGNOSIS — M810 Age-related osteoporosis without current pathological fracture: Secondary | ICD-10-CM

## 2019-01-17 DIAGNOSIS — Z952 Presence of prosthetic heart valve: Secondary | ICD-10-CM | POA: Diagnosis not present

## 2019-01-17 DIAGNOSIS — Z7901 Long term (current) use of anticoagulants: Secondary | ICD-10-CM | POA: Diagnosis not present

## 2019-01-17 DIAGNOSIS — E782 Mixed hyperlipidemia: Secondary | ICD-10-CM

## 2019-01-17 DIAGNOSIS — I10 Essential (primary) hypertension: Secondary | ICD-10-CM

## 2019-01-17 LAB — POCT INR: INR: 3.8 — AB (ref 2.0–3.0)

## 2019-01-17 MED ORDER — ALENDRONATE SODIUM 70 MG PO TABS: 70.0000 mg | ORAL_TABLET | ORAL | 1 refills | Status: DC

## 2019-01-17 MED ORDER — WARFARIN SODIUM 4 MG PO TABS: 4.0000 mg | ORAL_TABLET | Freq: Every day | ORAL | 0 refills | Status: DC

## 2019-01-17 NOTE — Progress Notes (Signed)
Careteam: Patient Care Team: [name], NP as PCP - General (Geriatric Medicine)  Advanced Directive information Does Patient Have a Medical Advance Directive?: Yes, Type of Advance Directive: [place];Living will, Does patient want to make changes to medical advance directive?: No - Patient declined  Allergies  Allergen Reactions  . Carvedilol Itching  . Ace Inhibitors Other (See Comments)    Reaction (??)  . Amlodipine Swelling  . Hydrochlorothiazide Other (See Comments)    Hyponatremia   . Metoprolol Other (See Comments)    Dizziness     Chief Complaint  Patient presents with  . Medical Management of Chronic Issues    Routine follow-up  . Anticoagulation    PT/INR check (3.8) no missed doses, no bleeding, and no major diet changes. Patient c/o brusing.   . Immunizations    Refused Flu vaccine today, patient will get at a later date   . Medication Refill    Renew fosamax if patient to continue      HPI: Patient is a 78 y.o. female seen in the office today routine follow up and INR check.  Hx of a fib s/p MVR in 1990 2/2 MR with valve on chronic coumadin- INR goal 2.5-3.5. INR today 3.8; no changes in medication, eating less greens recently. bruising easily but no major bruising.   htn- continues on hydralazine 50 mg TID with spironolactone 25 mg 1/2 tablet daily. Does not check blood pressure at home.   Hyperlipidemia- continues on lipitor 10 mg daily; does not make dietary modification. "Eat whatever I want to eat"  Osteoporosis- over due for bone density, continues on fosamax   Insomnia-sleeping well. Has not use trazodone in a long time. Using melatonin.   Dizziness- has not needed to use meclizine. No dizziness noted.   Review of Systems:  Review of Systems  Constitutional: Negative for chills, fever, malaise/fatigue and weight loss.  Respiratory: Negative for cough, hemoptysis, sputum production and shortness of breath.    Cardiovascular: Negative for chest pain, palpitations and leg swelling.  Gastrointestinal: Negative for blood in stool and diarrhea.  Genitourinary: Negative for hematuria.  Musculoskeletal: Negative for myalgias.  Neurological: Negative for dizziness, tingling and headaches.  Endo/Heme/Allergies: Negative for environmental allergies. Bruises/bleeds easily (small bruising noted).    Past Medical History:  Diagnosis Date  . Broken hip ([place]) 10/2011   left  . Broken wrist   . Depression   . Diarrhea   . H/O long-term (current) use of anticoagulants   . High cholesterol   . Hypertension   . Memory difficulty 01/18/2016  . 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 Osteoporosis   . SIADH (syndrome of inappropriate ADH production) ([place])   . Urinary retention    chronic  . Vertigo 01/18/2016   Past Surgical History:  Procedure Laterality Date  . HEMATOMA EVACUATION Left 11/25/2012   Procedure: EVACUATION HEMATOMA;  Surgeon: Linna Hoff, MD;  Location: [facility];  Service: Orthopedics;  Laterality: Left;  . HIP PINNING,CANNULATED Left 11/03/2012   Procedure: CANNULATED HIP PINNING;  Surgeon: Tobi Bastos, MD;  Location: [facility];  Service: Orthopedics;  Laterality: Left;  . IR INJECT/THERA/INC NEEDLE/CATH/PLC EPI/LUMB/SAC W/IMG  07/20/2017  . 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  . OPEN REDUCTION INTERNAL FIXATION (ORIF) WRIST WITH ILIAC CREST BONE GRAFT Left 11/24/2012   Procedure: OPEN REDUCTION INTERNAL FIXATION LEFT WRIST POSSIBLE BONE GRAFTING AND PINNING (ORIF) ;  Surgeon: Linna Hoff, MD;  Location: [facility];  Service: Orthopedics;  Laterality: Left;  . SHOULDER OPEN ROTATOR CUFF REPAIR     Social History:   reports that she has never smoked. She has never used smokeless tobacco. She reports current alcohol use of about 7.0 standard drinks of alcohol per week. She reports that she does not use drugs.  Family History  Problem Relation Age of Onset  . Stroke Mother    . Hypertension Mother   . Stroke Father   . Colon cancer Paternal Grandmother   . Parkinsonism Brother     Medications: Patient's Medications  New Prescriptions   No medications on file  Previous Medications   ALENDRONATE (FOSAMAX) 70 MG TABLET    Take 1 tablet (70 mg total) by mouth every Sunday. Take with a full glass of water on an empty stomach.   ASPIRIN EC 81 MG TABLET    Take 1 tablet (81 mg total) by mouth daily.   ATORVASTATIN (LIPITOR) 10 MG TABLET    Take 1 tablet (10 mg total) by mouth at bedtime.   HYDRALAZINE (APRESOLINE) 50 MG TABLET    TAKE ONE TABLET BY MOUTH THREE TIMES A DAY   MECLIZINE (ANTIVERT) 25 MG TABLET    Take 1 tablet (25 mg total) by mouth every 8 (eight) hours as needed for dizziness.   SPIRONOLACTONE (ALDACTONE) 25 MG TABLET    Take 0.5 tablets (12.5 mg total) by mouth daily.   TRAZODONE (DESYREL) 50 MG TABLET    Take 50 mg by mouth at bedtime as needed.    WARFARIN (COUMADIN) 4 MG TABLET    TAKE ONE TABLET BY MOUTH DAILY EXCEPT TUESDAYS. TAKE 1 AND 1/2 TABLET BY MOUTH EVERY TUESDAY.  Modified Medications   No medications on file  Discontinued Medications   No medications on file    Physical Exam:  Vitals:   01/17/19 1438  BP: (!) 144/86  Pulse: 86  Temp: (!) 97.5 F (36.4 C)  TempSrc: Temporal  SpO2: 97%  Weight: 157 lb (71.2 kg)  Height: 5\' 3"  (1.6 m)   Body mass index is 27.81 kg/m. Wt Readings from Last 3 Encounters:  01/17/19 157 lb (71.2 kg)  12/16/18 161 lb (73 kg)  11/16/18 161 lb (73 kg)    Physical Exam Vitals signs reviewed.  Constitutional:      General: She is not in acute distress.    Appearance: She is overweight.  HENT:     Head: Normocephalic.  Eyes:     Pupils: Pupils are equal, round, and reactive to light.  Neck:     Musculoskeletal: Normal range of motion.  Cardiovascular:     Rate and Rhythm: Normal rate. Rhythm irregular.     Heart sounds: Murmur present. No friction rub. No gallop.   Pulmonary:      Effort: Pulmonary effort is normal.     Breath sounds: Normal breath sounds.  Abdominal:     General: Bowel sounds are normal.     Palpations: Abdomen is soft.  Musculoskeletal: Normal range of motion.        General: No swelling or tenderness.     Right lower leg: No edema.     Left lower leg: No edema.  Skin:    General: Skin is warm and dry.     Coloration: Skin is not pale.     Findings: No erythema or rash.  Neurological:     Mental Status: She is alert and  oriented to person, place, and time.     Cranial Nerves: No cranial nerve deficit.     Sensory: No sensory deficit.     Coordination: Coordination normal.  Psychiatric:        Mood and Affect: Mood normal.        Behavior: Behavior normal.        Thought Content: Thought content normal.        Judgment: Judgment normal.     Labs reviewed: Basic Metabolic Panel: Recent Labs    03/30/18 1456 07/20/18 1400 09/07/18 1208  NA 135 134* 136  K 4.4 4.2 4.1  CL 99 100 102  CO2 27 23 26   GLUCOSE 99 108 116  BUN 18 18 17   CREATININE 0.77 0.83 0.73  CALCIUM 10.6* 10.2 10.1   Liver Function Tests: Recent Labs    03/30/18 1456  AST 19  ALT 16  BILITOT 1.5*  PROT 7.4   No results for input(s): LIPASE, AMYLASE in the last 8760 hours. No results for input(s): AMMONIA in the last 8760 hours. CBC: Recent Labs    03/30/18 1456 09/07/18 1208  WBC 7.7 7.4  NEUTROABS 5,552 5,513  HGB 14.9 15.3  HCT 45.8* 46.7*  MCV 86.3 87.5  PLT 361 345   Lipid Panel: Recent Labs    10/19/18 1203  CHOL 175  HDL 76  LDLCALC 85  TRIG 60  CHOLHDL 2.3   TSH: No results for input(s): TSH in the last 8760 hours. A1C: Lab Results  Component Value Date   HGBA1C 5.4 10/19/2018     Assessment/Plan 1. Long term (current) use of anticoagulants - POC INR 3.8, eating less greens lately, will decrease coumadin to 4 mg daily. Encouraged to be consistent with Vit K intake.  - warfarin (COUMADIN) 4 MG tablet; Take 1 tablet (4 mg  total) by mouth daily.  Dispense: 32 tablet; Refill: 0  2. Age related osteoporosis, unspecified pathological fracture presence -overdue for dexa scan, to call and make appt at this time, number provided  - alendronate (FOSAMAX) 70 MG tablet; Take 1 tablet (70 mg total) by mouth every Sunday. Take with a full glass of water on an empty stomach.  Dispense: 12 tablet; Refill: 1  3. Paroxysmal atrial fibrillation (HCC) -not on any rate controlling medication however rate controlled. Continues on coumadin for  - warfarin (COUMADIN) 4 MG tablet; Take 1 tablet (4 mg total) by mouth daily.  Dispense: 32 tablet; Refill: 0  4. H/O mitral valve replacement with mechanical valve Stable, continues on coumadin for anticoagulation.  - warfarin (COUMADIN) 4 MG tablet; Take 1 tablet (4 mg total) by mouth daily.  Dispense: 32 tablet; Refill: 0  5. Essential hypertension, benign Stable, blood pressure well controlled for patient at this time she has more difficulty with dizziness and balance issues when blood pressure lower. Will continue on current regimen, encouraged dietary compliance.   6. 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 Mixed hyperlipidemia Stable, continues on lipitor 10 mg daily   Next appt: 1 week for INR Aubreana Cornacchia K. [place], [facility] [phone]

## 2019-01-17 NOTE — Patient Instructions (Addendum)
To call [facility] to schedule bone density-- you are over due Address: [address], [place], [place] [address] Phone: [phone]  DECREASE COUMADIN TO 4 MG DAILY (Tomorrow (Tuesday) take 4 mg) Follow up INR in 1 week.

## 2019-01-20 IMAGING — CT CT HEAD W/O CM
3 series · 15 of 45 positions shown, 18 images · non-contrast
Comparison: 05/05/2017

CLINICAL DATA: History of stroke. Clinical evaluation for altered
level of consciousness, possibly vascular dementia.

EXAM:
CT HEAD WITHOUT CONTRAST
TECHNIQUE: Contiguous axial images were obtained from the base of the skull
through the vertex without intravenous contrast.

[Series 2: head wo · axial · 0.47mm/px · z∈[-165,-50]mm · 9 of 28 slices shown, 12 images]
[im 3/28  brain]
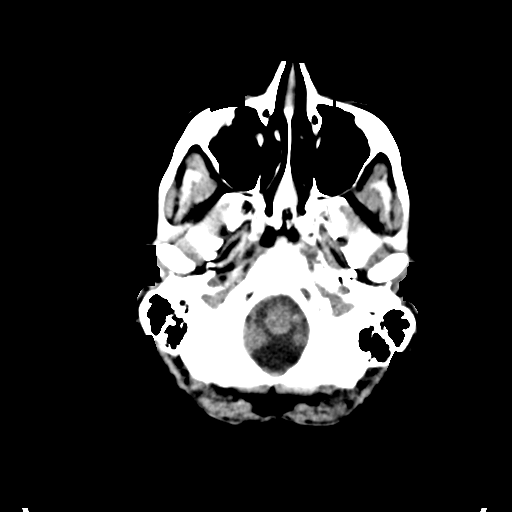
[im 3/28  bone]
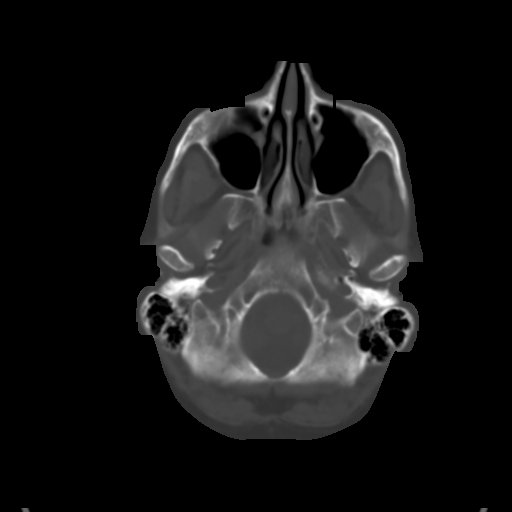
[im 6/28  brain]
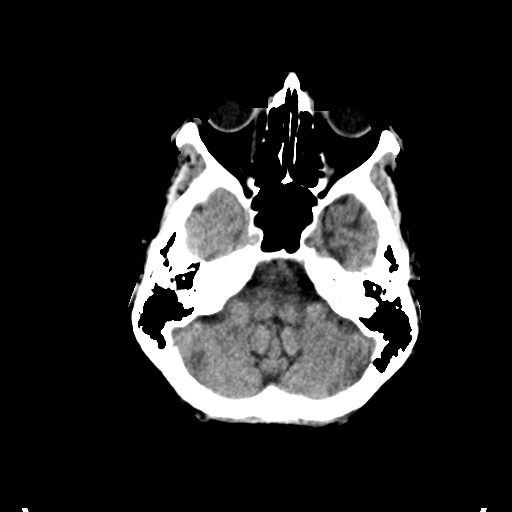
[im 9/28  brain]
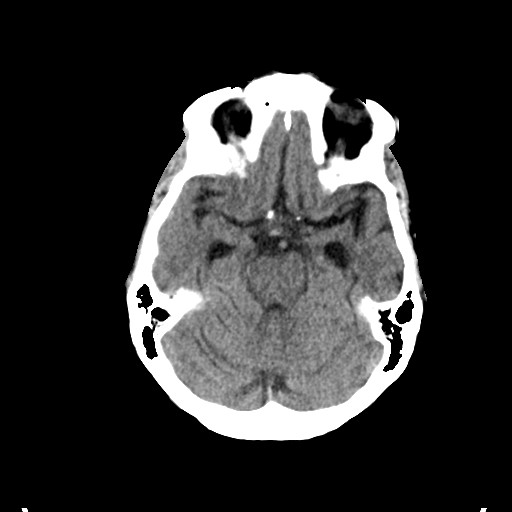
[im 12/28  brain]
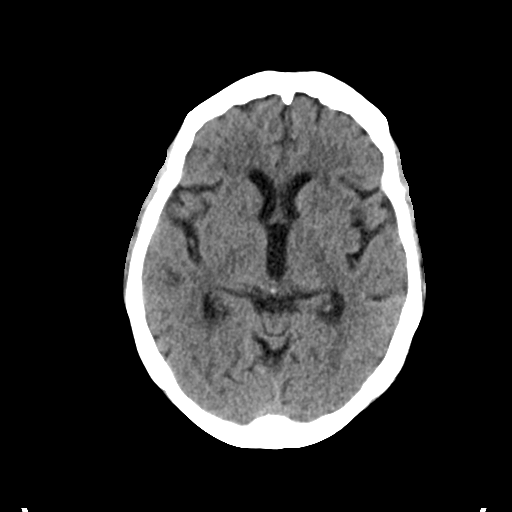
[im 15/28  brain]
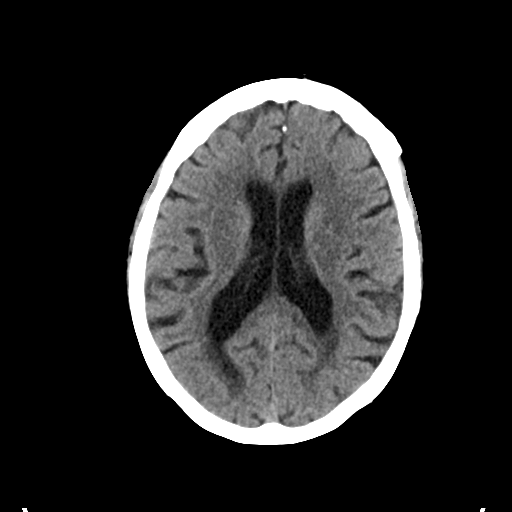
[im 15/28  bone]
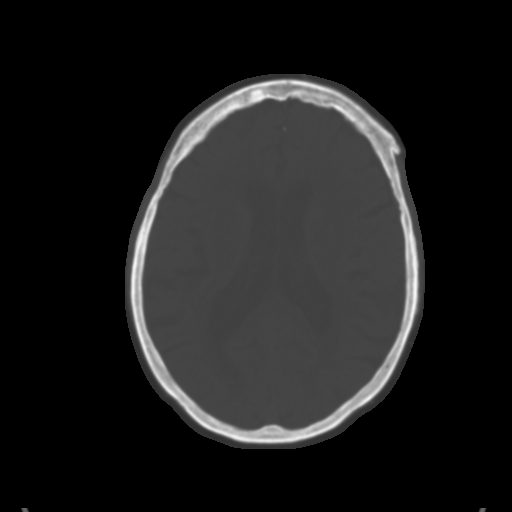
[im 17/28  brain]
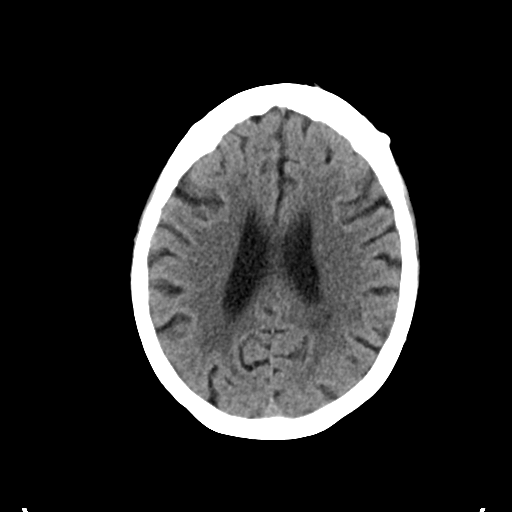
[im 20/28  brain]
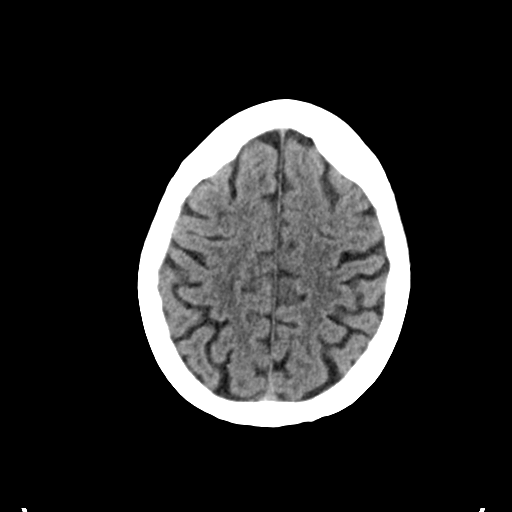
[im 23/28  brain]
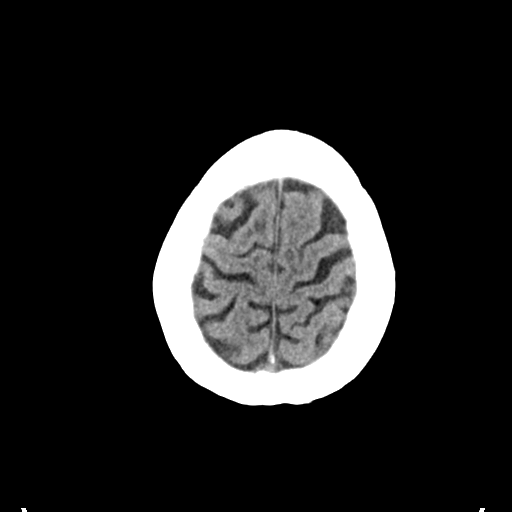
[im 26/28  brain]
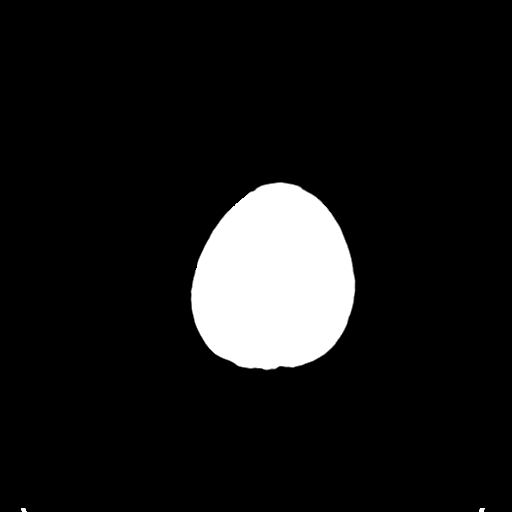
[im 26/28  bone]
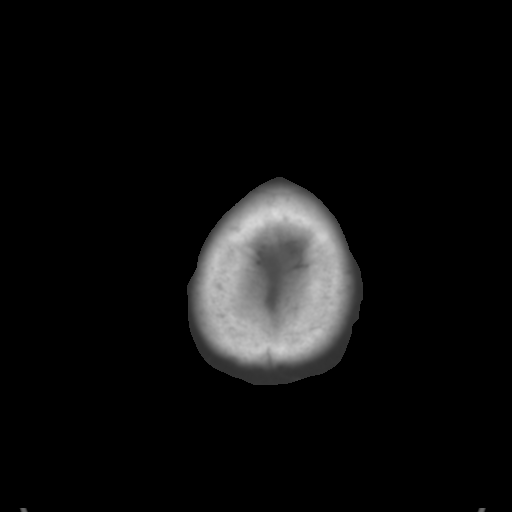

[Series 4: coronal soft tissue · coronal · 0.29mm/px · 3 of 58 slices shown]
[im 20/58  brain]
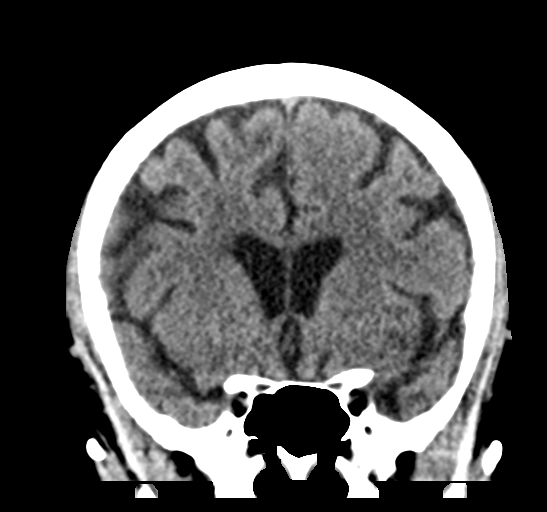
[im 26/58  brain]
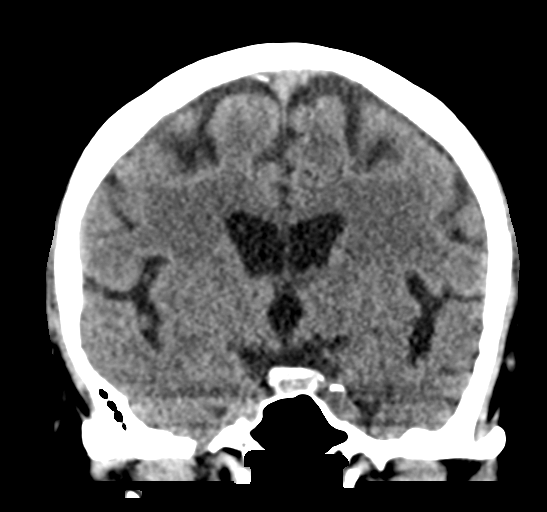
[im 32/58  brain]
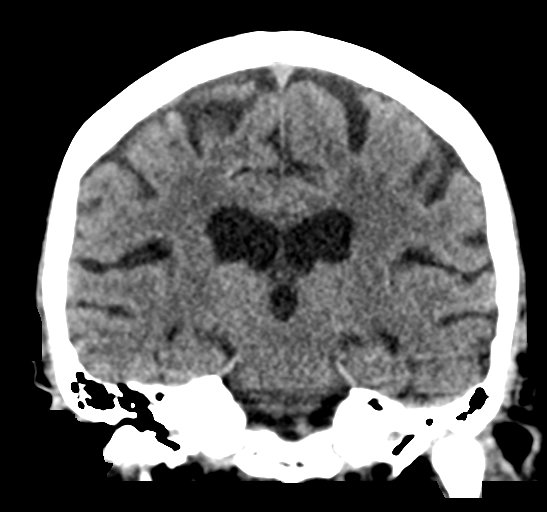

[Series 5: sagittal soft tissue · sagittal · 0.29mm/px · 3 of 47 slices shown]
[im 16/47  brain]
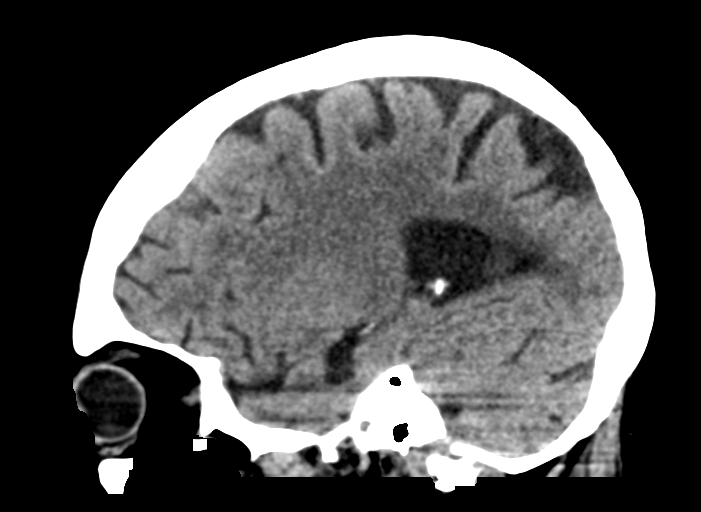
[im 24/47  brain]
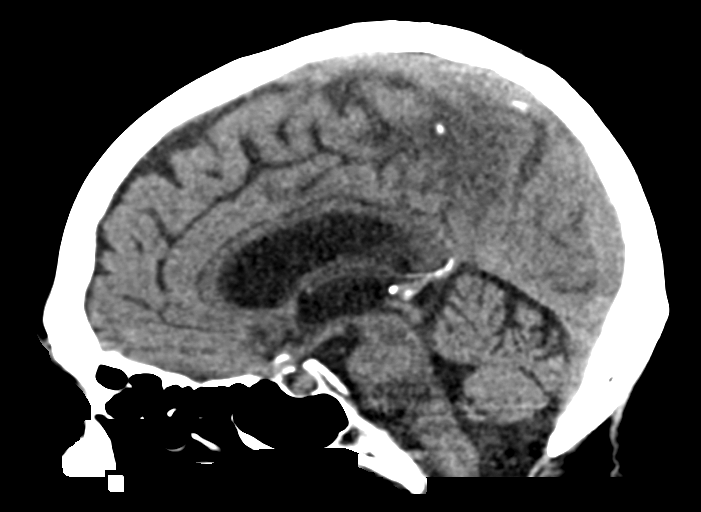
[im 31/47  brain]
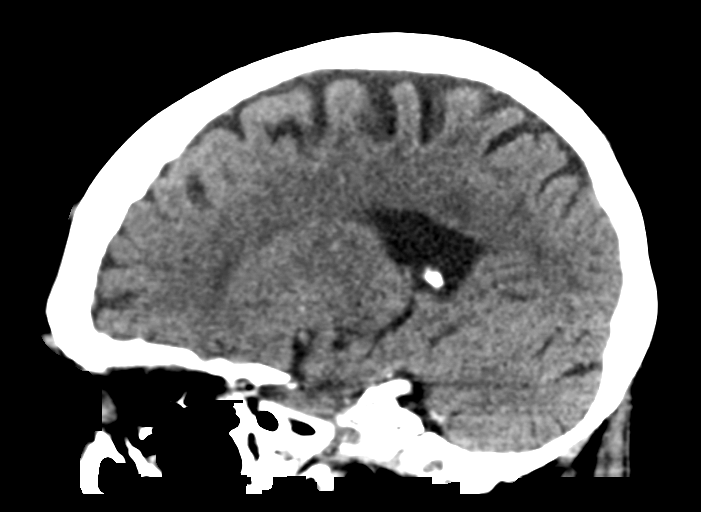

[15 of 45 positions shown; findings below may reference images not displayed]

FINDINGS: Brain: Mild age related volume loss. Mild chronic small vessel
ischemic changes of the cerebral hemispheric white matter. No sign
of other focal infarction, mass lesion, hemorrhage, hydrocephalus or
extra-axial collection.

Vascular: There is atherosclerotic calcification of the major
vessels at the base of the brain.

Skull: No significant finding. Left frontal outer table exostotic
prominence as seen previously.

Sinuses/Orbits: Clear/normal

Other: None
IMPRESSION: No change. Mild chronic small-vessel ischemic change of the cerebral
hemispheric white matter. Atherosclerotic calcification of the major
vessels at the base of the brain.

## 2019-01-26 ENCOUNTER — Ambulatory Visit (INDEPENDENT_AMBULATORY_CARE_PROVIDER_SITE_OTHER): Payer: Medicare Other | Admitting: Nurse Practitioner

## 2019-01-26 ENCOUNTER — Other Ambulatory Visit: Payer: Self-pay

## 2019-01-26 ENCOUNTER — Encounter: Payer: Self-pay | Admitting: Nurse Practitioner

## 2019-01-26 VITALS — BP 136/74 | HR 79 | Temp 97.5°F | Ht 63.0 in | Wt 156.0 lb

## 2019-01-26 DIAGNOSIS — Z7901 Long term (current) use of anticoagulants: Secondary | ICD-10-CM

## 2019-01-26 DIAGNOSIS — I480 Paroxysmal atrial fibrillation: Secondary | ICD-10-CM

## 2019-01-26 DIAGNOSIS — Z952 Presence of prosthetic heart valve: Secondary | ICD-10-CM

## 2019-01-26 LAB — POCT INR: INR: 5.1 — AB (ref 2.0–3.0)

## 2019-01-26 NOTE — Progress Notes (Signed)
Careteam: Patient Care Team: [name], NP as PCP - General (Geriatric Medicine)  Advanced Directive information    Allergies  Allergen Reactions  . Carvedilol Itching  . Ace Inhibitors Other (See Comments)    Reaction (??)  . Amlodipine Swelling  . Hydrochlorothiazide Other (See Comments)    Hyponatremia   . Metoprolol Other (See Comments)    Dizziness     Chief Complaint  Patient presents with  . Anticoagulation    1 week INR check      HPI: Patient is a 78 y.o. female seen in the office today for recheck of INR. Hx of a fib s/p MVR in 1990 2/2 MR with valve on chronic coumadin- INR goal 2.5-3.5. INR on last visit was 3.8; no changes in medication, eating less greens recently. Reported bruising easily but no major bruising.she was decrease to 4 mg of coumadin daily from 4 mg daily except 6 mg on tuesdays.   INR TODAY -- 5.1 Again no changes in medications, with increase in bruising noted but no excessive bruising or bleeding noted. No chest pains, shortness of breath, palpitations, swelling, dizziness or edema.    Review of Systems:  Review of Systems  Constitutional: Negative for chills, fever, malaise/fatigue and weight loss.  Respiratory: Negative for cough, hemoptysis, sputum production and shortness of breath.   Cardiovascular: Negative for chest pain, palpitations and leg swelling.  Gastrointestinal: Negative for blood in stool and diarrhea.  Genitourinary: Negative for hematuria.  Musculoskeletal: Negative for myalgias.  Neurological: Negative for dizziness, tingling and headaches.  Endo/Heme/Allergies: Negative for environmental allergies. Bruises/bleeds easily (small bruising noted on leg and arm).    Past Medical History:  Diagnosis Date  . Broken hip ([place]) 10/2011   left  . Broken wrist   . Depression   . Diarrhea   . H/O long-term (current) use of anticoagulants   . High cholesterol   . Hypertension   . Memory difficulty 01/18/2016   . 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 Osteoporosis   . SIADH (syndrome of inappropriate ADH production) ([place])   . Urinary retention    chronic  . Vertigo 01/18/2016   Past Surgical History:  Procedure Laterality Date  . HEMATOMA EVACUATION Left 11/25/2012   Procedure: EVACUATION HEMATOMA;  Surgeon: Linna Hoff, MD;  Location: [facility];  Service: Orthopedics;  Laterality: Left;  . HIP PINNING,CANNULATED Left 11/03/2012   Procedure: CANNULATED HIP PINNING;  Surgeon: Tobi Bastos, MD;  Location: [facility];  Service: Orthopedics;  Laterality: Left;  . IR INJECT/THERA/INC NEEDLE/CATH/PLC EPI/LUMB/SAC W/IMG  07/20/2017  . 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  . OPEN REDUCTION INTERNAL FIXATION (ORIF) WRIST WITH ILIAC CREST BONE GRAFT Left 11/24/2012   Procedure: OPEN REDUCTION INTERNAL FIXATION LEFT WRIST POSSIBLE BONE GRAFTING AND PINNING (ORIF) ;  Surgeon: Linna Hoff, MD;  Location: [facility];  Service: Orthopedics;  Laterality: Left;  . SHOULDER OPEN ROTATOR CUFF REPAIR     Social History:   reports that she has never smoked. She has never used smokeless tobacco. She reports current alcohol use of about 7.0 standard drinks of alcohol per week. She reports that she does not use drugs.  Family History  Problem Relation Age of Onset  . Stroke Mother   . Hypertension Mother   . Stroke Father   . Colon cancer Paternal Grandmother   . Parkinsonism Brother     Medications: Patient's Medications  New Prescriptions   No medications on file  Previous Medications   ALENDRONATE (FOSAMAX) 70 MG TABLET    Take 1 tablet (70 mg total) by mouth every Sunday. Take with a full glass of water on an empty stomach.   ASPIRIN EC 81 MG TABLET    Take 1 tablet (81 mg total) by mouth daily.   ATORVASTATIN (LIPITOR) 10 MG TABLET    Take 1 tablet (10 mg total) by mouth at bedtime.   HYDRALAZINE (APRESOLINE) 50 MG TABLET    TAKE ONE TABLET BY MOUTH THREE TIMES A DAY   MECLIZINE (ANTIVERT)  25 MG TABLET    Take 1 tablet (25 mg total) by mouth every 8 (eight) hours as needed for dizziness.   SPIRONOLACTONE (ALDACTONE) 25 MG TABLET    Take 0.5 tablets (12.5 mg total) by mouth daily.   WARFARIN (COUMADIN) 4 MG TABLET    Take 1 tablet (4 mg total) by mouth daily.  Modified Medications   No medications on file  Discontinued Medications   No medications on file    Physical Exam:  Vitals:   01/26/19 1439  BP: 136/74  Pulse: 79  Temp: (!) 97.5 F (36.4 C)  TempSrc: Temporal  SpO2: 97%  Weight: 156 lb (70.8 kg)  Height: 5\' 3"  (1.6 m)   Body mass index is 27.63 kg/m. Wt Readings from Last 3 Encounters:  01/26/19 156 lb (70.8 kg)  01/17/19 157 lb (71.2 kg)  12/16/18 161 lb (73 kg)    Physical Exam Vitals signs reviewed.  Constitutional:      General: She is not in acute distress.    Appearance: She is overweight.  HENT:     Head: Normocephalic.  Eyes:     Pupils: Pupils are equal, round, and reactive to light.  Neck:     Musculoskeletal: Normal range of motion.  Cardiovascular:     Rate and Rhythm: Normal rate. Rhythm irregular.     Heart sounds: Murmur present. No friction rub. No gallop.   Pulmonary:     Effort: Pulmonary effort is normal.     Breath sounds: Normal breath sounds.  Abdominal:     General: Bowel sounds are normal.     Palpations: Abdomen is soft.  Musculoskeletal: Normal range of motion.        General: No swelling or tenderness.     Right lower leg: No edema.     Left lower leg: No edema.  Skin:    General: Skin is warm and dry.     Findings: Bruising present.  Neurological:     Mental Status: She is alert and oriented to person, place, and time.     Sensory: No sensory deficit.  Psychiatric:        Mood and Affect: Mood normal.        Behavior: Behavior normal.        Thought Content: Thought content normal.        Judgment: Judgment normal.     Labs reviewed: Basic Metabolic Panel: Recent Labs    03/30/18 1456 07/20/18  1400 09/07/18 1208  NA 135 134* 136  K 4.4 4.2 4.1  CL 99 100 102  CO2 27 23 26   GLUCOSE 99 108 116  BUN 18 18 17   CREATININE 0.77 0.83 0.73  CALCIUM 10.6* 10.2 10.1   Liver Function Tests: Recent Labs    03/30/18 1456  AST 19  ALT 16  BILITOT 1.5*  PROT 7.4   No  results for input(s): LIPASE, AMYLASE in the last 8760 hours. No results for input(s): AMMONIA in the last 8760 hours. CBC: Recent Labs    03/30/18 1456 09/07/18 1208  WBC 7.7 7.4  NEUTROABS 5,552 5,513  HGB 14.9 15.3  HCT 45.8* 46.7*  MCV 86.3 87.5  PLT 361 345   Lipid Panel: Recent Labs    10/19/18 1203  CHOL 175  HDL 76  LDLCALC 85  TRIG 60  CHOLHDL 2.3   TSH: No results for input(s): TSH in the last 8760 hours. A1C: Lab Results  Component Value Date   HGBA1C 5.4 10/19/2018     Assessment/Plan 1. Long term (current) use of anticoagulants - POC INR 5.1, INR up despite going down on weekly coumadin. Will hold today and tomorrow and have her recheck in 2 days   2. Paroxysmal atrial fibrillation (HCC) Rate controlled without medication. Continues on coumadin for anticoagulation.   3. H/O mitral valve replacement with mechanical valve -continues on coumadin goal 2.5-3.5, elevated today at 5.1  Next appt: 2 days for INR check Keven Osborn K. [place], [facility] [phone]

## 2019-01-26 NOTE — Patient Instructions (Addendum)
Hold coumadin today Wednesday, Thursday and Friday   Come in for recheck on Friday

## 2019-01-28 ENCOUNTER — Ambulatory Visit (INDEPENDENT_AMBULATORY_CARE_PROVIDER_SITE_OTHER): Payer: Medicare Other | Admitting: Nurse Practitioner

## 2019-01-28 ENCOUNTER — Encounter: Payer: Self-pay | Admitting: Nurse Practitioner

## 2019-01-28 ENCOUNTER — Other Ambulatory Visit: Payer: Self-pay

## 2019-01-28 VITALS — BP 148/90 | HR 78 | Temp 96.4°F

## 2019-01-28 DIAGNOSIS — Z23 Encounter for immunization: Secondary | ICD-10-CM

## 2019-01-28 DIAGNOSIS — Z952 Presence of prosthetic heart valve: Secondary | ICD-10-CM

## 2019-01-28 DIAGNOSIS — Z7901 Long term (current) use of anticoagulants: Secondary | ICD-10-CM

## 2019-01-28 DIAGNOSIS — I480 Paroxysmal atrial fibrillation: Secondary | ICD-10-CM

## 2019-01-28 LAB — POCT INR: INR: 2.6 (ref 2.0–3.0)

## 2019-01-28 MED ORDER — WARFARIN SODIUM 4 MG PO TABS: ORAL_TABLET | ORAL | 0 refills | Status: DC

## 2019-01-28 NOTE — Patient Instructions (Addendum)
INR NOW AT 2.6  GOAL 2.5-3.5  Restart coumadin 4 mg daily except 2 mg on Tuesday and Thursday   FOLLOW UP in 1 week

## 2019-01-28 NOTE — Progress Notes (Signed)
Careteam: Patient Care Team: [name], NP as PCP - General (Geriatric Medicine)  Advanced Directive information    Allergies  Allergen Reactions  . Carvedilol Itching  . Ace Inhibitors Other (See Comments)    Reaction (??)  . Amlodipine Swelling  . Hydrochlorothiazide Other (See Comments)    Hyponatremia   . Metoprolol Other (See Comments)    Dizziness     Chief Complaint  Patient presents with  . Anticoagulation    2 day PT/INR check   . Immunizations    Discuss if ok to fet flu vaccine, was to receive 2 days ago and told to wait      HPI: Patient is a 78 y.o. female seen in the office today for INR follow up.  INR at last visit was 5.1 after decreasing coumadin to 4 mg daily Pt was instructed to hold coumadin for 2 days and to follow up today INR today 2.6- goal 2.5-3.5 There has been no changes in medication or diet.  She has not experienced any shortness of breath, chest pains, palpations, leg swelling, bleeding or worsening bruising.    Review of Systems:  Review of Systems  Constitutional: Negative for chills, fever, malaise/fatigue and weight loss.  Respiratory: Negative for cough, hemoptysis, sputum production and shortness of breath.   Cardiovascular: Negative for chest pain, palpitations and leg swelling.  Gastrointestinal: Negative for blood in stool and diarrhea.  Genitourinary: Negative for hematuria.  Musculoskeletal: Negative for myalgias.  Neurological: Negative for dizziness, tingling and headaches.  Endo/Heme/Allergies: Negative for environmental allergies. Bruises/bleeds easily (small bruising noted on leg and arm).    Past Medical History:  Diagnosis Date  . Broken hip ([place]) 10/2011   left  . Broken wrist   . Depression   . Diarrhea   . H/O long-term (current) use of anticoagulants   . High cholesterol   . Hypertension   . Memory difficulty 01/18/2016  . 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 Osteoporosis   .  SIADH (syndrome of inappropriate ADH production) ([place])   . Urinary retention    chronic  . Vertigo 01/18/2016   Past Surgical History:  Procedure Laterality Date  . HEMATOMA EVACUATION Left 11/25/2012   Procedure: EVACUATION HEMATOMA;  Surgeon: Linna Hoff, MD;  Location: [facility];  Service: Orthopedics;  Laterality: Left;  . HIP PINNING,CANNULATED Left 11/03/2012   Procedure: CANNULATED HIP PINNING;  Surgeon: Tobi Bastos, MD;  Location: [facility];  Service: Orthopedics;  Laterality: Left;  . IR INJECT/THERA/INC NEEDLE/CATH/PLC EPI/LUMB/SAC W/IMG  07/20/2017  . 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  . OPEN REDUCTION INTERNAL FIXATION (ORIF) WRIST WITH ILIAC CREST BONE GRAFT Left 11/24/2012   Procedure: OPEN REDUCTION INTERNAL FIXATION LEFT WRIST POSSIBLE BONE GRAFTING AND PINNING (ORIF) ;  Surgeon: Linna Hoff, MD;  Location: [facility];  Service: Orthopedics;  Laterality: Left;  . SHOULDER OPEN ROTATOR CUFF REPAIR     Social History:   reports that she has never smoked. She has never used smokeless tobacco. She reports current alcohol use of about 7.0 standard drinks of alcohol per week. She reports that she does not use drugs.  Family History  Problem Relation Age of Onset  . Stroke Mother   . Hypertension Mother   . Stroke Father   . Colon cancer Paternal Grandmother   . Parkinsonism Brother     Medications: Patient's Medications  New Prescriptions   No medications  on file  Previous Medications   ALENDRONATE (FOSAMAX) 70 MG TABLET    Take 1 tablet (70 mg total) by mouth every Sunday. Take with a full glass of water on an empty stomach.   ASPIRIN EC 81 MG TABLET    Take 1 tablet (81 mg total) by mouth daily.   ATORVASTATIN (LIPITOR) 10 MG TABLET    Take 1 tablet (10 mg total) by mouth at bedtime.   HYDRALAZINE (APRESOLINE) 50 MG TABLET    TAKE ONE TABLET BY MOUTH THREE TIMES A DAY   MECLIZINE (ANTIVERT) 25 MG TABLET    Take 1 tablet (25 mg total) by mouth every 8 (eight)  hours as needed for dizziness.   SPIRONOLACTONE (ALDACTONE) 25 MG TABLET    Take 0.5 tablets (12.5 mg total) by mouth daily.   WARFARIN (COUMADIN) 4 MG TABLET    Take 1 tablet (4 mg total) by mouth daily.  Modified Medications   No medications on file  Discontinued Medications   No medications on file    Physical Exam:  Vitals:   01/28/19 1410  BP: (!) 148/90  Pulse: 78  Temp: (!) 96.4 F (35.8 C)  TempSrc: Temporal  SpO2: 97%   There is no height or weight on file to calculate BMI. Wt Readings from Last 3 Encounters:  01/26/19 156 lb (70.8 kg)  01/17/19 157 lb (71.2 kg)  12/16/18 161 lb (73 kg)    Physical Exam Vitals signs reviewed.  Constitutional:      General: She is not in acute distress.    Appearance: She is overweight.  HENT:     Head: Normocephalic.  Eyes:     Pupils: Pupils are equal, round, and reactive to light.  Neck:     Musculoskeletal: Normal range of motion.  Cardiovascular:     Rate and Rhythm: Normal rate. Rhythm irregular.     Heart sounds: Murmur present. No friction rub. No gallop.   Pulmonary:     Effort: Pulmonary effort is normal.     Breath sounds: Normal breath sounds.  Abdominal:     General: Bowel sounds are normal.     Palpations: Abdomen is soft.  Musculoskeletal: Normal range of motion.        General: No swelling or tenderness.     Right lower leg: No edema.     Left lower leg: No edema.  Skin:    General: Skin is warm and dry.     Findings: Bruising present.  Neurological:     Mental Status: She is alert and oriented to person, place, and time.     Sensory: No sensory deficit.  Psychiatric:        Mood and Affect: Mood normal.        Behavior: Behavior normal.        Thought Content: Thought content normal.        Judgment: Judgment normal.     Labs reviewed: Basic Metabolic Panel: Recent Labs    03/30/18 1456 07/20/18 1400 09/07/18 1208  NA 135 134* 136  K 4.4 4.2 4.1  CL 99 100 102  CO2 27 23 26   GLUCOSE  99 108 116  BUN 18 18 17   CREATININE 0.77 0.83 0.73  CALCIUM 10.6* 10.2 10.1   Liver Function Tests: Recent Labs    03/30/18 1456  AST 19  ALT 16  BILITOT 1.5*  PROT 7.4   No results for input(s): LIPASE, AMYLASE in the last 8760 hours. No results  for input(s): AMMONIA in the last 8760 hours. CBC: Recent Labs    03/30/18 1456 09/07/18 1208  WBC 7.7 7.4  NEUTROABS 5,552 5,513  HGB 14.9 15.3  HCT 45.8* 46.7*  MCV 86.3 87.5  PLT 361 345   Lipid Panel: Recent Labs    10/19/18 1203  CHOL 175  HDL 76  LDLCALC 85  TRIG 60  CHOLHDL 2.3   TSH: No results for input(s): TSH in the last 8760 hours. A1C: Lab Results  Component Value Date   HGBA1C 5.4 10/19/2018     Assessment/Plan 1. Long term (current) use of anticoagulants - POC INR currently at goal from holding coumadin for 2 days. Will restart coumadin 4 mg daily except 2 mg on Tuesday and thursdays  -will have her follow up INR in 1 week  2. Paroxysmal atrial fibrillation (HCC) -rate controlled without medication, continues on coumadin for anticoagulation.  - POC INR  3. H/o mitral value replacement  -INR GOAL 2.5-3.5, at goal but has been holding due to elevated INR. Will restart coumadin 4 mg daily with 2 mg on Tuesday and thursdays.   4. Need for influenza vaccination - Flu Vaccine QUAD High Dose(Fluad)   Next appt: 1 weeks.  Carlos American. [facility], [facility] [phone]

## 2019-01-31 ENCOUNTER — Other Ambulatory Visit: Payer: Self-pay | Admitting: Family

## 2019-01-31 DIAGNOSIS — I480 Paroxysmal atrial fibrillation: Secondary | ICD-10-CM

## 2019-01-31 DIAGNOSIS — Z952 Presence of prosthetic heart valve: Secondary | ICD-10-CM

## 2019-01-31 DIAGNOSIS — Z7901 Long term (current) use of anticoagulants: Secondary | ICD-10-CM

## 2019-02-03 ENCOUNTER — Encounter: Payer: Self-pay | Admitting: Family

## 2019-02-03 ENCOUNTER — Other Ambulatory Visit: Payer: Self-pay

## 2019-02-03 ENCOUNTER — Ambulatory Visit (INDEPENDENT_AMBULATORY_CARE_PROVIDER_SITE_OTHER): Payer: Medicare Other | Admitting: Family

## 2019-02-03 VITALS — BP 140/82 | HR 90 | Temp 97.1°F | Resp 18 | Ht 63.0 in | Wt 153.2 lb

## 2019-02-03 DIAGNOSIS — I480 Paroxysmal atrial fibrillation: Secondary | ICD-10-CM | POA: Diagnosis not present

## 2019-02-03 DIAGNOSIS — Z7901 Long term (current) use of anticoagulants: Secondary | ICD-10-CM

## 2019-02-03 LAB — POCT INR: INR: 2.6 (ref 2.0–3.0)

## 2019-02-03 NOTE — Patient Instructions (Signed)
1.Continue on warfarin 4 mg tablet one by mouth daily except 2 mg tablet one by mouth on Tuesday and Thursday   2. Notify provider's office for any signs of bleeding.

## 2019-02-03 NOTE — Progress Notes (Signed)
Provider:   FNP-C   [name], NP  Patient Care Team: [name], NP as PCP - General (Geriatric Medicine)  Extended Emergency Contact Information Primary Emergency Contact: Graziosi,Pam Address: [place] [address] [place] Phone: [phone] Mobile Phone: [phone] Relation: Daughter Secondary Emergency Contact: Pilar Plate [place] Phone: [phone] Mobile Phone: [phone] Relation: Other  Code Status: Goals of care: Advanced Directive information Advanced Directives 01/17/2019  Does Patient Have a Medical Advance Directive? Yes  Type of Paramedic of [place];Living will  Does patient want to make changes to medical advance directive? No - Patient declined  Copy of [place] in Chart? Yes - validated most recent copy scanned in chart (See row information)  Would patient like information on creating a medical advance directive? -  Pre-existing out of facility DNR order (yellow form or pink MOST form) -     Chief Complaint  Patient presents with  . Anticoagulation    PT INR check     HPI:  Pt is a 77 y.o. female seen today for INR check.she states currently taking coumadin 4 mg tablet daily except 2 mg tablet on Tuesday and Thursday.she has not missed any doses.Denies any signs of bleeding.Has had no changes in her diet or medication.Previous INR was 2.6 with a goal of 2.5-3.5 Her INR today is 2.6 she denies any acute issues.   Past Medical History:  Diagnosis Date  . Broken hip ([place]) 10/2011   left  . Broken wrist   . Depression   . Diarrhea   . H/O long-term (current) use of anticoagulants   . High cholesterol   . Hypertension   . Memory difficulty 01/18/2016  . 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 Osteoporosis   . SIADH (syndrome of inappropriate ADH production) ([place])   . Urinary retention    chronic   . Vertigo 01/18/2016   Past Surgical History:  Procedure Laterality Date  . HEMATOMA EVACUATION Left 11/25/2012   Procedure: EVACUATION HEMATOMA;  Surgeon: Linna Hoff, MD;  Location: [facility];  Service: Orthopedics;  Laterality: Left;  . HIP PINNING,CANNULATED Left 11/03/2012   Procedure: CANNULATED HIP PINNING;  Surgeon: Tobi Bastos, MD;  Location: [facility];  Service: Orthopedics;  Laterality: Left;  . IR INJECT/THERA/INC NEEDLE/CATH/PLC EPI/LUMB/SAC W/IMG  07/20/2017  . 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  . OPEN REDUCTION INTERNAL FIXATION (ORIF) WRIST WITH ILIAC CREST BONE GRAFT Left 11/24/2012   Procedure: OPEN REDUCTION INTERNAL FIXATION LEFT WRIST POSSIBLE BONE GRAFTING AND PINNING (ORIF) ;  Surgeon: Linna Hoff, MD;  Location: [facility];  Service: Orthopedics;  Laterality: Left;  . SHOULDER OPEN ROTATOR CUFF REPAIR      Allergies  Allergen Reactions  . Carvedilol Itching  . Ace Inhibitors Other (See Comments)    Reaction (??)  . Amlodipine Swelling  . Hydrochlorothiazide Other (See Comments)    Hyponatremia   . Metoprolol Other (See Comments)    Dizziness     Allergies as of 02/03/2019      Reactions   Carvedilol Itching   Ace Inhibitors Other (See Comments)   Reaction (??)   Amlodipine Swelling   Hydrochlorothiazide Other (See Comments)   Hyponatremia   Metoprolol Other (See Comments)   Dizziness       Medication List  Accurate as of February 03, 2019  1:19 PM. If you have any questions, ask your nurse or doctor.        alendronate 70 MG tablet Commonly known as: FOSAMAX Take 1 tablet (70 mg total) by mouth every Sunday. Take with a full glass of water on an empty stomach.   aspirin EC 81 MG tablet Take 1 tablet (81 mg total) by mouth daily.   atorvastatin 10 MG tablet Commonly known as: LIPITOR Take 1 tablet (10 mg total) by mouth at bedtime.   hydrALAZINE 50 MG tablet Commonly known as: APRESOLINE TAKE ONE TABLET BY MOUTH THREE  TIMES A DAY   meclizine 25 MG tablet Commonly known as: ANTIVERT Take 1 tablet (25 mg total) by mouth every 8 (eight) hours as needed for dizziness.   spironolactone 25 MG tablet Commonly known as: ALDACTONE Take 0.5 tablets (12.5 mg total) by mouth daily.   warfarin 4 MG tablet Commonly known as: COUMADIN Take as directed by the anticoagulation clinic. If you are unsure how to take this medication, talk to your nurse or doctor. Original instructions: 4 mg daily except 2 mg on Tuesday and Thursdays       Review of Systems  Constitutional: Negative for appetite change, chills, fatigue and fever.  HENT: Negative for congestion, nosebleeds, rhinorrhea, sinus pressure, sinus pain, sneezing and sore throat.   Respiratory: Negative for cough, chest tightness, shortness of breath and wheezing.   Cardiovascular: Negative for chest pain, palpitations and leg swelling.  Gastrointestinal: Negative for abdominal distention, abdominal pain, anal bleeding, blood in stool, constipation, diarrhea, nausea, rectal pain and vomiting.  Genitourinary: Negative for flank pain, hematuria and vaginal bleeding.  Musculoskeletal: Positive for gait problem.       Walks with a cane   Skin: Negative for color change, pallor and rash.  Neurological: Negative for dizziness, light-headedness and headaches.  Hematological: Bruises/bleeds easily.    Immunization History  Administered Date(s) Administered  . Fluad Quad(high Dose 65+) 01/28/2019  . Influenza, High Dose Seasonal PF 02/17/2014, 02/14/2015  . Influenza, Seasonal, Injecte, Preservative Fre 02/14/2016  . Influenza,inj,Quad PF,6+ Mos 02/10/2018  . Influenza-Unspecified 02/23/2013, 02/26/2017  . Pneumococcal Conjugate-13 08/27/2013  . Pneumococcal Polysaccharide-23 05/03/2007, 09/26/2009, 02/17/2014  . Td 06/26/2005, 07/09/2005  . Tetanus 06/26/2005, 07/09/2005  . Zoster 08/19/2013   Pertinent  Health Maintenance Due  Topic Date Due  .  COLONOSCOPY  03/31/2019 (Originally 09/20/2015)  . INFLUENZA VACCINE  Completed  . DEXA SCAN  Completed  . PNA vac Low Risk Adult  Completed   Fall Risk  12/16/2018 10/19/2018 10/06/2018 10/05/2018 08/09/2018  Falls in the past year? 0 0 0 0 0  Number falls in past yr: 0 0 0 - 0  Injury with Fall? 0 0 0 0 0   Functional Status Survey:    Vitals:   02/03/19 1315  BP: 140/82  Pulse: 90  Temp: (!) 97.1 F (36.2 C)  TempSrc: Temporal  SpO2: 97%  Weight: 153 lb 3.2 oz (69.5 kg)  Height: 5\' 3"  (1.6 m)   Body mass index is 27.14 kg/m. Physical Exam Vitals signs reviewed.  Constitutional:      General: She is not in acute distress.    Appearance: She is overweight. She is not ill-appearing.  HENT:     Nose: No congestion or rhinorrhea.  Eyes:     General: No scleral icterus.       Right eye: No discharge.        Left  eye: No discharge.     Conjunctiva/sclera: Conjunctivae normal.     Pupils: Pupils are equal, round, and reactive to light.  Neck:     Musculoskeletal: Normal range of motion. No neck rigidity or muscular tenderness.     Vascular: No carotid bruit.  Cardiovascular:     Rate and Rhythm: Normal rate. Rhythm irregular.     Pulses: Normal pulses.     Heart sounds: Murmur present. No friction rub. No gallop.   Pulmonary:     Effort: Pulmonary effort is normal. No respiratory distress.     Breath sounds: Normal breath sounds. No wheezing, rhonchi or rales.  Chest:     Chest wall: No tenderness.  Abdominal:     General: Bowel sounds are normal. There is no distension.     Palpations: Abdomen is soft. There is no mass.     Tenderness: There is no abdominal tenderness. There is no right CVA tenderness, left CVA tenderness, guarding or rebound.  Musculoskeletal:        General: No swelling or tenderness.     Right lower leg: No edema.     Left lower leg: No edema.     Comments: Gait steady with a cane   Lymphadenopathy:     Cervical: No cervical adenopathy.  Skin:     General: Skin is warm and dry.     Coloration: Skin is not pale.     Findings: No erythema or rash.     Comments: Left thigh small purple bruise   Neurological:     Mental Status: She is alert and oriented to person, place, and time.     Cranial Nerves: No cranial nerve deficit.     Sensory: No sensory deficit.     Motor: No weakness.     Gait: Gait abnormal.  Psychiatric:        Mood and Affect: Mood normal.        Behavior: Behavior normal.        Thought Content: Thought content normal.        Judgment: Judgment normal.    Labs reviewed: Recent Labs    03/30/18 1456 07/20/18 1400 09/07/18 1208  NA 135 134* 136  K 4.4 4.2 4.1  CL 99 100 102  CO2 27 23 26   GLUCOSE 99 108 116  BUN 18 18 17   CREATININE 0.77 0.83 0.73  CALCIUM 10.6* 10.2 10.1   Recent Labs    03/30/18 1456  AST 19  ALT 16  BILITOT 1.5*  PROT 7.4   Recent Labs    03/30/18 1456 09/07/18 1208  WBC 7.7 7.4  NEUTROABS 5,552 5,513  HGB 14.9 15.3  HCT 45.8* 46.7*  MCV 86.3 87.5  PLT 361 345   Lab Results  Component Value Date   TSH 0.72 10/02/2017   Lab Results  Component Value Date   HGBA1C 5.4 10/19/2018   Lab Results  Component Value Date   CHOL 175 10/19/2018   HDL 76 10/19/2018   LDLCALC 85 10/19/2018   TRIG 60 10/19/2018   CHOLHDL 2.3 10/19/2018    Significant Diagnostic Results in last 30 days:  No results found.  Assessment/Plan 1. Long term (current) use of anticoagulants INR 2.6 today at goal.No active bleeding noted.continue on coumadin 4 mg tablet one by mouth daily except coumadin 2 mg tablet one by mouth on Tuesday's and Thursday. - Notify provider's office for any signs of bleeding. - POC INR 2.6  - will return in  4 weeks for INR check   2. Paroxysmal atrial fibrillation (HCC) Continue on coumadin as above. - POC INR  Family/ staff Communication: Reviewed plan of care with patient.  Labs/tests ordered: None   Next Appt: 4 weeks with Sherrie Mustache for INR  check   Sandrea Hughs, NP

## 2019-03-04 ENCOUNTER — Other Ambulatory Visit: Payer: Self-pay

## 2019-03-04 ENCOUNTER — Ambulatory Visit (INDEPENDENT_AMBULATORY_CARE_PROVIDER_SITE_OTHER): Payer: Medicare Other | Admitting: Nurse Practitioner

## 2019-03-04 ENCOUNTER — Encounter: Payer: Self-pay | Admitting: Nurse Practitioner

## 2019-03-04 VITALS — BP 166/88 | HR 93 | Temp 97.0°F | Resp 18 | Ht 63.0 in | Wt 155.4 lb

## 2019-03-04 DIAGNOSIS — Z7901 Long term (current) use of anticoagulants: Secondary | ICD-10-CM

## 2019-03-04 DIAGNOSIS — W19XXXA Unspecified fall, initial encounter: Secondary | ICD-10-CM

## 2019-03-04 DIAGNOSIS — R2681 Unsteadiness on feet: Secondary | ICD-10-CM | POA: Diagnosis not present

## 2019-03-04 DIAGNOSIS — I480 Paroxysmal atrial fibrillation: Secondary | ICD-10-CM | POA: Diagnosis not present

## 2019-03-04 DIAGNOSIS — Z952 Presence of prosthetic heart valve: Secondary | ICD-10-CM

## 2019-03-04 LAB — POCT INR: INR: 2.3 (ref 2.0–3.0)

## 2019-03-04 NOTE — Patient Instructions (Signed)
Minimize wine intake  Continue coumadin 4 mg daily with 2 mg on Tuesday and Thursday  Follow up INR in 2 weeks.

## 2019-03-04 NOTE — Progress Notes (Signed)
Careteam: Patient Care Team: [name], NP as PCP - General (Geriatric Medicine)  Advanced Directive information Does Patient Have a Medical Advance Directive?: Yes, Type of Advance Directive: [place];Living will, Does patient want to make changes to medical advance directive?: No - Patient declined  Allergies  Allergen Reactions  . Carvedilol Itching  . Ace Inhibitors Other (See Comments)    Reaction (??)  . Amlodipine Swelling  . Hydrochlorothiazide Other (See Comments)    Hyponatremia   . Metoprolol Other (See Comments)    Dizziness     Chief Complaint  Patient presents with  . Medical Management of Chronic Issues    INR- check     HPI: Patient is a 78 y.o. female seen in the office today for INR check. Goal INR 2.5-3.5 INR today 2.3 taking coumadin 4 mg daily except 2 mg on tuesdays and thursdays  No missed doses. No changes in diet or medication. No abnormal bruising or bleeding  Pt had a fall last night, she is not sure what happened, feels like she had too much wine to drink. She drank 1.5 glasses but did not have anything to eat. It made her dizzy and she generally does not have this effect.  Her friend helped her home and she ate once she got to the house. Hit the ground with some abrasions around the eye, otherwise no injuries.  No headaches, blurred vision. No pain but tender when she touches the area.   Reports she drinks a glass of wine every night around 9-10 oclock.    Review of Systems:  Review of Systems  Constitutional: Negative for chills, fever, malaise/fatigue and weight loss.  Respiratory: Negative for cough, hemoptysis, sputum production and shortness of breath.   Cardiovascular: Negative for chest pain, palpitations and leg swelling.  Gastrointestinal: Negative for blood in stool and diarrhea.  Genitourinary: Negative for hematuria.  Musculoskeletal: Positive for falls. Negative for myalgias.  Neurological:  Negative for dizziness, tingling, tremors, sensory change, focal weakness, weakness and headaches.  Endo/Heme/Allergies: Negative for environmental allergies. Bruises/bleeds easily (small bruising noted on leg and arm).    Past Medical History:  Diagnosis Date  . Broken hip ([place]) 10/2011   left  . Broken wrist   . Depression   . Diarrhea   . H/O long-term (current) use of anticoagulants   . High cholesterol   . Hypertension   . Memory difficulty 01/18/2016  . 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 Osteoporosis   . SIADH (syndrome of inappropriate ADH production) ([place])   . Urinary retention    chronic  . Vertigo 01/18/2016   Past Surgical History:  Procedure Laterality Date  . HEMATOMA EVACUATION Left 11/25/2012   Procedure: EVACUATION HEMATOMA;  Surgeon: Linna Hoff, MD;  Location: [facility];  Service: Orthopedics;  Laterality: Left;  . HIP PINNING,CANNULATED Left 11/03/2012   Procedure: CANNULATED HIP PINNING;  Surgeon: Tobi Bastos, MD;  Location[facility];  Service: Orthopedics;  Laterality: Left;  . IR INJECT/THERA/INC NEEDLE/CATH/PLC EPI/LUMB/SAC W/IMG  07/20/2017  . 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  . OPEN REDUCTION INTERNAL FIXATION (ORIF) WRIST WITH ILIAC CREST BONE GRAFT Left 11/24/2012   Procedure: OPEN REDUCTION INTERNAL FIXATION LEFT WRIST POSSIBLE BONE GRAFTING AND PINNING (ORIF) ;  Surgeon: Linna Hoff, MD;  Location: [facility];  Service: Orthopedics;  Laterality: Left;  . SHOULDER OPEN ROTATOR CUFF REPAIR  Social History:   reports that she has never smoked. She has never used smokeless tobacco. She reports current alcohol use of about 7.0 standard drinks of alcohol per week. She reports that she does not use drugs.  Family History  Problem Relation Age of Onset  . Stroke Mother   . Hypertension Mother   . Stroke Father   . Colon cancer Paternal Grandmother   . Parkinsonism Brother     Medications: Patient's Medications  New  Prescriptions   No medications on file  Previous Medications   ALENDRONATE (FOSAMAX) 70 MG TABLET    Take 1 tablet (70 mg total) by mouth every Sunday. Take with a full glass of water on an empty stomach.   ASPIRIN EC 81 MG TABLET    Take 1 tablet (81 mg total) by mouth daily.   ATORVASTATIN (LIPITOR) 10 MG TABLET    Take 1 tablet (10 mg total) by mouth at bedtime.   HYDRALAZINE (APRESOLINE) 50 MG TABLET    TAKE ONE TABLET BY MOUTH THREE TIMES A DAY   MECLIZINE (ANTIVERT) 25 MG TABLET    Take 1 tablet (25 mg total) by mouth every 8 (eight) hours as needed for dizziness.   SPIRONOLACTONE (ALDACTONE) 25 MG TABLET    Take 0.5 tablets (12.5 mg total) by mouth daily.   WARFARIN (COUMADIN) 4 MG TABLET    4 mg daily except 2 mg on Tuesday and Thursdays  Modified Medications   No medications on file  Discontinued Medications   No medications on file    Physical Exam:  Vitals:   03/04/19 1414  Pulse: 93  Resp: 18  Temp: (!) 97 F (36.1 C)  SpO2: 96%  Weight: 155 lb 6.4 oz (70.5 kg)  Height: 5\' 3"  (1.6 m)   Body mass index is 27.53 kg/m. Wt Readings from Last 3 Encounters:  03/04/19 155 lb 6.4 oz (70.5 kg)  02/03/19 153 lb 3.2 oz (69.5 kg)  01/26/19 156 lb (70.8 kg)    Physical Exam Constitutional:      General: She is not in acute distress.    Appearance: She is overweight.  HENT:     Head: Abrasion, contusion and right periorbital erythema present.     Comments: Redness noted around left eye from abrasions due to fall Left cheek with bruising and swelling.     Nose: No congestion or rhinorrhea.     Mouth/Throat:     Mouth: Mucous membranes are moist.  Eyes:     General: No scleral icterus.       Right eye: No discharge.        Left eye: No discharge.     Conjunctiva/sclera: Conjunctivae normal.     Pupils: Pupils are equal, round, and reactive to light.  Neck:     Musculoskeletal: Normal range of motion. No neck rigidity or muscular tenderness.     Vascular: No carotid  bruit.  Cardiovascular:     Rate and Rhythm: Normal rate. Rhythm irregular.     Pulses: Normal pulses.     Heart sounds: Murmur present. No friction rub. No gallop.   Pulmonary:     Effort: Pulmonary effort is normal. No respiratory distress.     Breath sounds: Normal breath sounds. No wheezing, rhonchi or rales.  Chest:     Chest wall: No tenderness.  Abdominal:     General: Bowel sounds are normal.     Palpations: Abdomen is soft.     Tenderness: There is  no rebound.  Musculoskeletal: Normal range of motion.        General: No swelling or tenderness.     Right lower leg: No edema.     Left lower leg: No edema.     Comments: Gait steady with a cane   Lymphadenopathy:     Cervical: No cervical adenopathy.  Skin:    General: Skin is warm and dry.     Coloration: Skin is not pale.     Findings: Bruising present. No erythema or rash.  Neurological:     Mental Status: She is alert and oriented to person, place, and time.     Cranial Nerves: No cranial nerve deficit.     Sensory: No sensory deficit.     Motor: No weakness.     Gait: Gait abnormal.  Psychiatric:        Mood and Affect: Mood normal.        Behavior: Behavior normal.        Thought Content: Thought content normal.        Judgment: Judgment normal.     Labs reviewed: Basic Metabolic Panel: Recent Labs    03/30/18 1456 07/20/18 1400 09/07/18 1208  NA 135 134* 136  K 4.4 4.2 4.1  CL 99 100 102  CO2 27 23 26   GLUCOSE 99 108 116  BUN 18 18 17   CREATININE 0.77 0.83 0.73  CALCIUM 10.6* 10.2 10.1   Liver Function Tests: Recent Labs    03/30/18 1456  AST 19  ALT 16  BILITOT 1.5*  PROT 7.4   No results for input(s): LIPASE, AMYLASE in the last 8760 hours. No results for input(s): AMMONIA in the last 8760 hours. CBC: Recent Labs    03/30/18 1456 09/07/18 1208  WBC 7.7 7.4  NEUTROABS 5,552 5,513  HGB 14.9 15.3  HCT 45.8* 46.7*  MCV 86.3 87.5  PLT 361 345   Lipid Panel: Recent Labs     10/19/18 1203  CHOL 175  HDL 76  LDLCALC 85  TRIG 60  CHOLHDL 2.3   TSH: No results for input(s): TSH in the last 8760 hours. A1C: Lab Results  Component Value Date   HGBA1C 5.4 10/19/2018     Assessment/Plan 1. Unsteady gait -pt with baseline unsteady gait, uses a cane. Discussed her ETOH use and encouraged cessation due to high risk of fall and the use of coumadin which puts her at high risk for adverse event. Continues to use cane for support.   2. H/O mitral valve replacement with mechanical valve -INR goal 2.5-3.5, maintains on coumadin  3. Long term current use of anticoagulant INR 2.3 goal of 2.5-3.5, she is currently on coumadin 4 mg daily except 2 mg Tuesday and Thursday. Due to pt being very sensitive to coumadin titration will continue her on current regimen. Previously INR was elevated and coumadin had to be decreased. She has been maintained on current dose for 1 month after INR was 5.1. will have her rechecked in 2 weeks.  4. Paroxysmal atrial fibrillation (HCC) Rate controlled without medication, continues on coumadin for anticoagulation.   5. Fall, initial encounter -reports she was dizzy from drinking wine. Encouraged cessation of wine and education provided on fall prevention.  Education provided as to when to seek emergency intervention.   Next appt: 2 weeks for INR.  Carlos American. [place], [facility] [phone]

## 2019-03-05 ENCOUNTER — Other Ambulatory Visit: Payer: Self-pay | Admitting: Nurse Practitioner

## 2019-03-05 DIAGNOSIS — I10 Essential (primary) hypertension: Secondary | ICD-10-CM

## 2019-03-13 ENCOUNTER — Other Ambulatory Visit: Payer: Self-pay | Admitting: Nurse Practitioner

## 2019-03-13 ENCOUNTER — Other Ambulatory Visit: Payer: Self-pay | Admitting: Family

## 2019-03-13 DIAGNOSIS — I480 Paroxysmal atrial fibrillation: Secondary | ICD-10-CM

## 2019-03-13 DIAGNOSIS — Z952 Presence of prosthetic heart valve: Secondary | ICD-10-CM

## 2019-03-13 DIAGNOSIS — Z7901 Long term (current) use of anticoagulants: Secondary | ICD-10-CM

## 2019-03-14 NOTE — Telephone Encounter (Signed)
Routing to provider for approval. High allergy / contraindication indicated.

## 2019-03-21 ENCOUNTER — Ambulatory Visit (INDEPENDENT_AMBULATORY_CARE_PROVIDER_SITE_OTHER): Payer: Medicare Other | Admitting: Nurse Practitioner

## 2019-03-21 ENCOUNTER — Encounter: Payer: Self-pay | Admitting: Nurse Practitioner

## 2019-03-21 ENCOUNTER — Other Ambulatory Visit: Payer: Self-pay

## 2019-03-21 VITALS — BP 150/82 | HR 97 | Temp 97.7°F | Ht 63.0 in | Wt 156.0 lb

## 2019-03-21 DIAGNOSIS — Z7901 Long term (current) use of anticoagulants: Secondary | ICD-10-CM

## 2019-03-21 DIAGNOSIS — R2681 Unsteadiness on feet: Secondary | ICD-10-CM

## 2019-03-21 DIAGNOSIS — I480 Paroxysmal atrial fibrillation: Secondary | ICD-10-CM

## 2019-03-21 DIAGNOSIS — W19XXXA Unspecified fall, initial encounter: Secondary | ICD-10-CM

## 2019-03-21 DIAGNOSIS — F1010 Alcohol abuse, uncomplicated: Secondary | ICD-10-CM

## 2019-03-21 DIAGNOSIS — R55 Syncope and collapse: Secondary | ICD-10-CM

## 2019-03-21 DIAGNOSIS — Z952 Presence of prosthetic heart valve: Secondary | ICD-10-CM

## 2019-03-21 DIAGNOSIS — R413 Other amnesia: Secondary | ICD-10-CM

## 2019-03-21 LAB — POCT INR: INR: 1.6 — AB (ref 2.0–3.0)

## 2019-03-21 NOTE — Progress Notes (Signed)
Careteam: Patient Care Team: [name], NP as PCP - General (Geriatric Medicine)  Advanced Directive information    Allergies  Allergen Reactions  . Carvedilol Itching  . Ace Inhibitors Other (See Comments)    Reaction (??)  . Amlodipine Swelling  . Hydrochlorothiazide Other (See Comments)    Hyponatremia   . Metoprolol Other (See Comments)    Dizziness     Chief Complaint  Patient presents with  . Anticoagulation    PT/INR check. Patient had a recent fall, patient with brusing on left side of hip      HPI: Patient is a 78 y.o. female seen in the office today for INR follow up.  Pt reports she fell last night, states she does not know what happened she just remembers waking up on the floor and now she is all bruised. States she does not think she hit her head because she does not have any bruises or pain to her head.  Back of left arm tender as well as left side and the anterior aspect of left thigh. When talking with her she did not remember the fall she had 2 weeks ago after having wine at her friends house.  Pt reports she had 2 glasses of wine last night as well. Denies dizziness, headache, shortness of breath, palpitations or chest pains. Pt chronically dizzy but reports she has not been dizzy. States she is now walking with a cane due to leg being tender where she previously was not having to.   Established with Dr Radford Pax last year.   She has been taking coumadin 4 mg daily except Tuesday and Thursday taking 2 mg daily. No missed doses, no changes in medication. Drinking wine daily, fills glass to the top- drinking 2 glasses daily.  Unsure if she has even gone a day without drinking.  Has wine every night. No symptoms of withdrawal.  "have been drinking forever"  She agreed for me to call her daughter.  Daughter very concern. States she drinks "2 glasses easy" of wine at night. Daughter had encouraged her to quit drinking as well. Daughter lives out of  town in [place], [place].  Daughter concerned over memory, even though she scores well on her MMSE.  She was at her house during christmas 2019, very agitated and had confusion (earily stages of dementia)  Friend called her daughter and was also concerned over memory loss. Would like her evaluated further due to these signs.  Daughter tried to get her a life alert and she cancelled it, tried to get someone to come in but she hated them and basically kicked them out. Daughter has tried to get her help but she is not been agreeable.     Review of Systems:  Review of Systems  Constitutional: Negative for chills, fever and weight loss.  HENT: Negative for congestion, nosebleeds, sore throat and tinnitus.   Respiratory: Negative for cough, sputum production and shortness of breath.   Cardiovascular: Negative for chest pain, palpitations and leg swelling.  Gastrointestinal: Negative for abdominal pain, constipation, diarrhea and heartburn.  Genitourinary: Negative for dysuria, frequency and urgency.  Musculoskeletal: Positive for falls (2 in 2 weeks ). Negative for back pain, joint pain and myalgias.  Skin: Negative.   Neurological: Negative for dizziness, tingling, sensory change, focal weakness, weakness and headaches.  Psychiatric/Behavioral: Positive for memory loss. Negative for depression. The patient is not nervous/anxious and does not have insomnia.    Past Medical History:  Diagnosis  Date  . Broken hip ([place]) 10/2011   left  . Broken wrist   . Depression   . Diarrhea   . H/O long-term (current) use of anticoagulants   . High cholesterol   . Hypertension   . Memory difficulty 01/18/2016  . 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 Osteoporosis   . SIADH (syndrome of inappropriate ADH production) ([place])   . Urinary retention    chronic  . Vertigo 01/18/2016   Past Surgical History:  Procedure Laterality Date  . HEMATOMA EVACUATION Left 11/25/2012   Procedure:  EVACUATION HEMATOMA;  Surgeon: Linna Hoff, MD;  Location: [facility];  Service: Orthopedics;  Laterality: Left;  . HIP PINNING,CANNULATED Left 11/03/2012   Procedure: CANNULATED HIP PINNING;  Surgeon: Tobi Bastos, MD;  Location[facility];  Service: Orthopedics;  Laterality: Left;  . IR INJECT/THERA/INC NEEDLE/CATH/PLC EPI/LUMB/SAC W/IMG  07/20/2017  . 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  . OPEN REDUCTION INTERNAL FIXATION (ORIF) WRIST WITH ILIAC CREST BONE GRAFT Left 11/24/2012   Procedure: OPEN REDUCTION INTERNAL FIXATION LEFT WRIST POSSIBLE BONE GRAFTING AND PINNING (ORIF) ;  Surgeon: Linna Hoff, MD;  Location: [facility];  Service: Orthopedics;  Laterality: Left;  . SHOULDER OPEN ROTATOR CUFF REPAIR     Social History:   reports that she has never smoked. She has never used smokeless tobacco. She reports current alcohol use of about 7.0 standard drinks of alcohol per week. She reports that she does not use drugs.  Family History  Problem Relation Age of Onset  . Stroke Mother   . Hypertension Mother   . Stroke Father   . Colon cancer Paternal Grandmother   . Parkinsonism Brother     Medications: Patient's Medications  New Prescriptions   No medications on file  Previous Medications   ALENDRONATE (FOSAMAX) 70 MG TABLET    Take 1 tablet (70 mg total) by mouth every Sunday. Take with a full glass of water on an empty stomach.   ASPIRIN EC 81 MG TABLET    Take 1 tablet (81 mg total) by mouth daily.   ATORVASTATIN (LIPITOR) 10 MG TABLET    TAKE ONE TABLET BY MOUTH EVERY NIGHT AT BEDTIME   HYDRALAZINE (APRESOLINE) 50 MG TABLET    TAKE ONE TABLET BY MOUTH THREE TIMES A DAY   MECLIZINE (ANTIVERT) 25 MG TABLET    Take 1 tablet (25 mg total) by mouth every 8 (eight) hours as needed for dizziness.   SPIRONOLACTONE (ALDACTONE) 25 MG TABLET    Take 0.5 tablets (12.5 mg total) by mouth daily.   WARFARIN (COUMADIN) 4 MG TABLET    TAKE ONE TABLET BY MOUTH EVERY EVENING AS DIRECTED   Modified Medications   No medications on file  Discontinued Medications   No medications on file    Physical Exam:  Vitals:   03/21/19 1456  BP: (!) 150/82  Pulse: 97  Temp: 97.7 F (36.5 C)  TempSrc: Temporal  SpO2: 96%  Weight: 156 lb (70.8 kg)  Height: 5\' 3"  (1.6 m)   Body mass index is 27.63 kg/m. Wt Readings from Last 3 Encounters:  03/21/19 156 lb (70.8 kg)  03/04/19 155 lb 6.4 oz (70.5 kg)  02/03/19 153 lb 3.2 oz (69.5 kg)    Physical Exam Constitutional:      General: She is not in acute distress.    Appearance: She is well-developed. She is not diaphoretic.  HENT:  Head: Normocephalic and atraumatic.     Right Ear: Tympanic membrane and ear canal normal.     Left Ear: Tympanic membrane and ear canal normal.     Nose: Nose normal.     Mouth/Throat:     Mouth: Mucous membranes are moist.     Pharynx: Oropharynx is clear. No oropharyngeal exudate.  Eyes:     Conjunctiva/sclera: Conjunctivae normal.     Pupils: Pupils are equal, round, and reactive to light.  Neck:     Musculoskeletal: Normal range of motion and neck supple.  Cardiovascular:     Rate and Rhythm: Normal rate and regular rhythm.     Heart sounds: Murmur present.  Pulmonary:     Effort: Pulmonary effort is normal.     Breath sounds: Normal breath sounds.  Abdominal:     General: Bowel sounds are normal.     Palpations: Abdomen is soft.  Musculoskeletal: Normal range of motion.        General: Tenderness (to bruised areas ) present.     Right lower leg: No edema.     Left lower leg: No edema.  Skin:    General: Skin is warm and dry.  Neurological:     Mental Status: She is alert and oriented to person, place, and time.     Cranial Nerves: No cranial nerve deficit.     Sensory: No sensory deficit.     Motor: No weakness.     Coordination: Coordination normal.     Gait: Gait normal.  Psychiatric:        Mood and Affect: Mood normal.     Labs reviewed: Basic Metabolic Panel:  Recent Labs    03/30/18 1456 07/20/18 1400 09/07/18 1208  NA 135 134* 136  K 4.4 4.2 4.1  CL 99 100 102  CO2 27 23 26   GLUCOSE 99 108 116  BUN 18 18 17   CREATININE 0.77 0.83 0.73  CALCIUM 10.6* 10.2 10.1   Liver Function Tests: Recent Labs    03/30/18 1456  AST 19  ALT 16  BILITOT 1.5*  PROT 7.4   No results for input(s): LIPASE, AMYLASE in the last 8760 hours. No results for input(s): AMMONIA in the last 8760 hours. CBC: Recent Labs    03/30/18 1456 09/07/18 1208  WBC 7.7 7.4  NEUTROABS 5,552 5,513  HGB 14.9 15.3  HCT 45.8* 46.7*  MCV 86.3 87.5  PLT 361 345   Lipid Panel: Recent Labs    10/19/18 1203  CHOL 175  HDL 76  LDLCALC 85  TRIG 60  CHOLHDL 2.3   TSH: No results for input(s): TSH in the last 8760 hours. A1C: Lab Results  Component Value Date   HGBA1C 5.4 10/19/2018     Assessment/Plan 1. Long term current use of anticoagulant - POC INR 1.6 goal 2.5-3.5 - pt reports compliance of coumadin will increase to coumadin 4 mg daily and have her follow up in 2 weeks.  2. H/O mitral valve replacement with mechanical valve -goal INR 2.5-3.5, will increase coumadin to 4 mg daily to help get to goal.  - TSH  3. Paroxysmal atrial fibrillation (HCC) -rate has been controlled. HR slightly elevated today - EKG 12-Lead done which shows some non-specific changes, reviewed EKG with Dr Mariea Clonts. incluing QRS widing compared to previous EKG with flipped T wave in V5.  She was unaware of who her cardiologist was or that she was supposed to follow up -appt made today and she was  agreeable for follow up.  - TSH  4. Unsteady gait -continues to use cane.  - Ambulatory referral to Physical Therapy for further evaluation   5. Fall, initial encounter -has had 2 falls in the last  2 week, question if this is related to ETOH use. No neurological deficits noted other than progressive memory loss. No head injury noted.  -encouraged to quit drinking alcohol.  -  Ambulatory referral to Physical Therapy - CBC with Differential/Platelet - COMPLETE METABOLIC PANEL WITH GFR - TSH  6. Syncope, unspecified syncope type -pt had similar episode back in 2018 which ended her up in the hospital for days - where she had been drinking and "passed out" fell onto her bed and a sharp object that caused her to have excessive bleeding.  -stressed importance of cessation of ETOH  -encouraged life alert -appt made for follow up with cardiologist.  - CBC with Differential/Platelet - COMPLETE METABOLIC PANEL WITH GFR - Ammonia - TSH  7. ETOH abuse -drinking 2 full glasses of wine daily for as long as she can remember, this could be contributing to memory loss and falls. Question if she is also dehydrated as well.  -strongly encouraged cessation.   - CBC with Differential/Platelet - COMPLETE METABOLIC PANEL WITH GFR - Ammonia  8. Memory loss -daughter is very concerned over memory loss and pt showing more signs of forgetfulness and memory deficits. She is very forgetful during OV as well but remembers her dosing to medications. Daughter plans to have some conversations with her to help improve safety.  - CT Head Wo Contrast; Future -B12 and folate level -RPR -CMP -TSH -CBC today -possible neuropysch evaluation but will get CT and labs first.   Next appt: 2 weeks.  Carlos American. Harle Battiest  [facility] [phone]   Total time 50 mins:  time greater than 50% of total time spent doing pt counseling and coordination of care due to increase confusion, memory loss, fall.

## 2019-03-21 NOTE — Patient Instructions (Addendum)
Get a life alert- if you fall and can not get up or in touch with someone this will be a lifeline  To stop drinking, worried this is contributing to your falls.   To follow up with cardiologist  Increase coumadin to 4 mg daily  INR in 2 weeks  PT referral has been made

## 2019-03-23 ENCOUNTER — Encounter: Payer: Self-pay | Admitting: Nurse Practitioner

## 2019-03-24 ENCOUNTER — Other Ambulatory Visit: Payer: Self-pay

## 2019-03-24 LAB — COMPLETE METABOLIC PANEL WITH GFR
AG Ratio: 1.9 (calc) (ref 1.0–2.5)
ALT: 12 U/L (ref 6–29)
AST: 17 U/L (ref 10–35)
Albumin: 4.6 g/dL (ref 3.6–5.1)
Alkaline phosphatase (APISO): 69 U/L (ref 37–153)
BUN: 15 mg/dL (ref 7–25)
CO2: 22 mmol/L (ref 20–32)
Calcium: 9.8 mg/dL (ref 8.6–10.4)
Chloride: 101 mmol/L (ref 98–110)
Creat: 0.8 mg/dL (ref 0.60–0.93)
GFR, Est African American: 83 mL/min/{1.73_m2} (ref >=60)
GFR, Est Non African American: 72 mL/min/{1.73_m2} (ref >=60)
Globulin: 2.4 g/dL (calc) (ref 1.9–3.7)
Glucose, Bld: 102 mg/dL (ref 65–139)
Potassium: 4.3 mmol/L (ref 3.5–5.3)
Sodium: 136 mmol/L (ref 135–146)
Total Bilirubin: 1.6 mg/dL — ABNORMAL HIGH (ref 0.2–1.2)
Total Protein: 7 g/dL (ref 6.1–8.1)

## 2019-03-24 LAB — CBC WITH DIFFERENTIAL/PLATELET
Absolute Monocytes: 974 cells/uL — ABNORMAL HIGH (ref 200–950)
Basophils Absolute: 61 cells/uL (ref 0–200)
Basophils Relative: 0.7 %
Eosinophils Absolute: 78 cells/uL (ref 15–500)
Eosinophils Relative: 0.9 %
HCT: 44.3 % (ref 35.0–45.0)
Hemoglobin: 14.5 g/dL (ref 11.7–15.5)
Lymphs Abs: 1244 cells/uL (ref 850–3900)
MCH: 28.1 pg (ref 27.0–33.0)
MCHC: 32.7 g/dL (ref 32.0–36.0)
MCV: 85.9 fL (ref 80.0–100.0)
MPV: 10.4 fL (ref 7.5–12.5)
Monocytes Relative: 11.2 %
Neutro Abs: 6342 cells/uL (ref 1500–7800)
Neutrophils Relative %: 72.9 %
Platelets: 377 10*3/uL (ref 140–400)
RBC: 5.16 10*6/uL — ABNORMAL HIGH (ref 3.80–5.10)
RDW: 13.3 % (ref 11.0–15.0)
Total Lymphocyte: 14.3 %
WBC: 8.7 10*3/uL (ref 3.8–10.8)

## 2019-03-24 LAB — AMMONIA: Ammonia: 34 umol/L (ref <=72)

## 2019-03-24 LAB — B12 AND FOLATE PANEL
Folate: 2.3 ng/mL — ABNORMAL LOW
Vitamin B-12: 235 pg/mL (ref 200–1100)

## 2019-03-24 LAB — TEST AUTHORIZATION

## 2019-03-24 LAB — TSH: TSH: 1.24 mIU/L (ref 0.40–4.50)

## 2019-03-24 LAB — RPR: RPR Ser Ql: NONREACTIVE

## 2019-03-24 MED ORDER — VITAMIN B-12 1000 MCG PO TABS: 1000.0000 ug | ORAL_TABLET | Freq: Every day | ORAL | 0 refills | Status: DC

## 2019-03-24 MED ORDER — FOLIC ACID 1 MG PO TABS: 1.0000 mg | ORAL_TABLET | Freq: Every day | ORAL | 0 refills | Status: DC

## 2019-03-24 NOTE — Telephone Encounter (Signed)
Message routed to Eubanks, Jessica K, NP  

## 2019-03-25 ENCOUNTER — Telehealth: Payer: Self-pay

## 2019-03-25 NOTE — Telephone Encounter (Signed)
Spoke with patients daughter to inform her that we had made the appointment for her mother for her CT Scan and her mother canceled it right after she said she would call and discuss this with her as she is in [place] and can not bring her I told her that if we could be of any help to let us know

## 2019-03-29 ENCOUNTER — Other Ambulatory Visit: Payer: PRIVATE HEALTH INSURANCE

## 2019-04-04 ENCOUNTER — Encounter: Payer: Self-pay | Admitting: Nurse Practitioner

## 2019-04-04 ENCOUNTER — Other Ambulatory Visit: Payer: Self-pay

## 2019-04-04 ENCOUNTER — Ambulatory Visit (INDEPENDENT_AMBULATORY_CARE_PROVIDER_SITE_OTHER): Payer: Medicare Other | Admitting: Nurse Practitioner

## 2019-04-04 VITALS — BP 162/80 | HR 91 | Temp 96.9°F | Ht 63.0 in | Wt 152.0 lb

## 2019-04-04 DIAGNOSIS — I480 Paroxysmal atrial fibrillation: Secondary | ICD-10-CM | POA: Diagnosis not present

## 2019-04-04 DIAGNOSIS — Z952 Presence of prosthetic heart valve: Secondary | ICD-10-CM | POA: Diagnosis not present

## 2019-04-04 DIAGNOSIS — F1010 Alcohol abuse, uncomplicated: Secondary | ICD-10-CM

## 2019-04-04 DIAGNOSIS — Z7901 Long term (current) use of anticoagulants: Secondary | ICD-10-CM

## 2019-04-04 DIAGNOSIS — R413 Other amnesia: Secondary | ICD-10-CM

## 2019-04-04 DIAGNOSIS — R634 Abnormal weight loss: Secondary | ICD-10-CM

## 2019-04-04 DIAGNOSIS — E538 Deficiency of other specified B group vitamins: Secondary | ICD-10-CM

## 2019-04-04 DIAGNOSIS — I10 Essential (primary) hypertension: Secondary | ICD-10-CM

## 2019-04-04 LAB — POCT INR: INR: 3.5 — AB (ref 2.0–3.0)

## 2019-04-04 NOTE — Patient Instructions (Addendum)
CHANGE Coumadin to 4 mg daily except 2 mg (1/2 tablet) on MONDAYS    TO CALL [facility] and schedule head CT Phone: [phone]   Continue to work on decreasing wine intake  Going down to 1/2 a glass at night then stopping all together would be ideal

## 2019-04-04 NOTE — Progress Notes (Signed)
Careteam: Patient Care Team: [name], NP as PCP - General (Geriatric Medicine) Sueanne Margarita, MD as Consulting Physician (Cardiology)  Advanced Directive information    Allergies  Allergen Reactions  . Carvedilol Itching  . Ace Inhibitors Other (See Comments)    Reaction (??)  . Amlodipine Swelling  . Hydrochlorothiazide Other (See Comments)    Hyponatremia   . Metoprolol Other (See Comments)    Dizziness     Chief Complaint  Patient presents with  . Anticoagulation    PT/INR Check 3.5 today      HPI: Patient is a 78 y.o. female seen in the office today for INR check.  Last INR was 1.6 she was taking 4 mg daily except 2 on Tuesday and Thursday due to ongoing subtheraputic INR coumadin was increase to coumadin 4 mg daily. Today INR 3.5  INR goal 2.5-3.5 She has cut back on drinking. Now drinking 1 full glass instead of 2 full glasses. Reports she did not drink for 2 nights after last OV  No falls since last follow up.   Has silenced all incoming calls from unknown numbers therefore unsure if she has heard from [facility] to set up CT head  htn- blood pressure up to today- thinks she took her medication today but unsure.   Reports she is not very hungry- not eating much, has lost some weight.     Lab Results  Component Value Date   INR 3.5 (A) 04/04/2019   INR 1.6 (A) 03/21/2019   INR 2.3 03/04/2019   PROTIME 24.3 (A) 02/27/2017       Review of Systems:  Review of Systems  Constitutional: Positive for weight loss. Negative for chills and fever.  HENT: Negative for tinnitus.   Respiratory: Negative for cough, sputum production and shortness of breath.   Cardiovascular: Negative for chest pain, palpitations and leg swelling.  Gastrointestinal: Negative for abdominal pain, constipation, diarrhea and heartburn.  Genitourinary: Negative for dysuria, frequency and urgency.  Musculoskeletal: Negative for back pain, falls, joint pain and  myalgias.  Skin: Negative.   Neurological: Negative for dizziness and headaches.  Psychiatric/Behavioral: Positive for memory loss. Negative for depression. The patient does not have insomnia.     Past Medical History:  Diagnosis Date  . Broken hip ([place]) 10/2011   left  . Broken wrist   . Depression   . Diarrhea   . H/O long-term (current) use of anticoagulants   . High cholesterol   . Hypertension   . Memory difficulty 01/18/2016  . 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 Osteoporosis   . SIADH (syndrome of inappropriate ADH production) ([place])   . Urinary retention    chronic  . Vertigo 01/18/2016   Past Surgical History:  Procedure Laterality Date  . HEMATOMA EVACUATION Left 11/25/2012   Procedure: EVACUATION HEMATOMA;  Surgeon: Linna Hoff, MD;  Location: [facility];  Service: Orthopedics;  Laterality: Left;  . HIP PINNING,CANNULATED Left 11/03/2012   Procedure: CANNULATED HIP PINNING;  Surgeon: Tobi Bastos, MD;  Location: [facility];  Service: Orthopedics;  Laterality: Left;  . IR INJECT/THERA/INC NEEDLE/CATH/PLC EPI/LUMB/SAC W/IMG  07/20/2017  . 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  . OPEN REDUCTION INTERNAL FIXATION (ORIF) WRIST WITH ILIAC CREST BONE GRAFT Left 11/24/2012   Procedure: OPEN REDUCTION INTERNAL FIXATION LEFT WRIST POSSIBLE BONE GRAFTING AND PINNING (ORIF) ;  Surgeon: Linna Hoff, MD;  Location: [facility];  Service: Orthopedics;  Laterality: Left;  . SHOULDER OPEN ROTATOR CUFF REPAIR     Social History:   reports that she has never smoked. She has never used smokeless tobacco. She reports current alcohol use of about 7.0 standard drinks of alcohol per week. She reports that she does not use drugs.  Family History  Problem Relation Age of Onset  . Stroke Mother   . Hypertension Mother   . Stroke Father   . Colon cancer Paternal Grandmother   . Parkinsonism Brother     Medications: Patient's Medications  New Prescriptions   No medications on  file  Previous Medications   ALENDRONATE (FOSAMAX) 70 MG TABLET    Take 1 tablet (70 mg total) by mouth every Sunday. Take with a full glass of water on an empty stomach.   ASPIRIN EC 81 MG TABLET    Take 1 tablet (81 mg total) by mouth daily.   ATORVASTATIN (LIPITOR) 10 MG TABLET    TAKE ONE TABLET BY MOUTH EVERY NIGHT AT BEDTIME   FOLIC ACID (FOLVITE) 1 MG TABLET    Take 1 tablet (1 mg total) by mouth daily.   HYDRALAZINE (APRESOLINE) 50 MG TABLET    TAKE ONE TABLET BY MOUTH THREE TIMES A DAY   MECLIZINE (ANTIVERT) 25 MG TABLET    Take 1 tablet (25 mg total) by mouth every 8 (eight) hours as needed for dizziness.   SPIRONOLACTONE (ALDACTONE) 25 MG TABLET    Take 0.5 tablets (12.5 mg total) by mouth daily.   VITAMIN B-12 (CYANOCOBALAMIN) 1000 MCG TABLET    Take 1 tablet (1,000 mcg total) by mouth daily.   WARFARIN (COUMADIN) 4 MG TABLET    TAKE ONE TABLET BY MOUTH EVERY EVENING AS DIRECTED  Modified Medications   No medications on file  Discontinued Medications   No medications on file    Physical Exam:  Vitals:   04/04/19 1405  BP: (!) 162/80  Pulse: 91  Temp: (!) 96.9 F (36.1 C)  TempSrc: Temporal  SpO2: 98%  Weight: 152 lb (68.9 kg)  Height: 5\' 3"  (1.6 m)   Body mass index is 26.93 kg/m. Wt Readings from Last 3 Encounters:  04/04/19 152 lb (68.9 kg)  03/21/19 156 lb (70.8 kg)  03/04/19 155 lb 6.4 oz (70.5 kg)    Physical Exam Constitutional:      General: She is not in acute distress.    Appearance: She is well-developed. She is not diaphoretic.  HENT:     Head: Normocephalic and atraumatic.  Eyes:     Conjunctiva/sclera: Conjunctivae normal.     Pupils: Pupils are equal, round, and reactive to light.  Neck:     Musculoskeletal: Normal range of motion and neck supple.  Cardiovascular:     Rate and Rhythm: Normal rate. Rhythm irregular.     Heart sounds: Murmur present.  Pulmonary:     Effort: Pulmonary effort is normal.     Breath sounds: Normal breath  sounds.  Abdominal:     General: Bowel sounds are normal.     Palpations: Abdomen is soft.  Musculoskeletal:        General: No tenderness.  Skin:    General: Skin is warm and dry.  Neurological:     Mental Status: She is alert and oriented to person, place, and time.  Psychiatric:        Mood and Affect: Mood normal.     Labs reviewed: Basic Metabolic Panel: Recent Labs  07/20/18 1400 09/07/18 1208 03/21/19 1600  NA 134* 136 136  K 4.2 4.1 4.3  CL 100 102 101  CO2 23 26 22   GLUCOSE 108 116 102  BUN 18 17 15   CREATININE 0.83 0.73 0.80  CALCIUM 10.2 10.1 9.8  TSH  --   --  1.24   Liver Function Tests: Recent Labs    03/21/19 1600  AST 17  ALT 12  BILITOT 1.6*  PROT 7.0   No results for input(s): LIPASE, AMYLASE in the last 8760 hours. Recent Labs    03/21/19 1600  AMMONIA 34   CBC: Recent Labs    09/07/18 1208 03/21/19 1600  WBC 7.4 8.7  NEUTROABS 5,513 6,342  HGB 15.3 14.5  HCT 46.7* 44.3  MCV 87.5 85.9  PLT 345 377   Lipid Panel: Recent Labs    10/19/18 1203  CHOL 175  HDL 76  LDLCALC 85  TRIG 60  CHOLHDL 2.3   TSH: Recent Labs    03/21/19 1600  TSH 1.24   A1C: Lab Results  Component Value Date   HGBA1C 5.4 10/19/2018     Assessment/Plan 1. Long term current use of anticoagulant - POC INR 3.5, GOAL 2.5-3.5 however due to hx of falls with ETOH abuse would prefer her closer to 3, will decrease coumadin to 4 mg daily with 2 mg on Monday.   2. H/O mitral valve replacement with mechanical valve -continues on coumadin therapy goal 2.5-3.5  3. Paroxysmal atrial fibrillation (HCC) Rate controlled without medication. Continues on coumadin for anticoagulation   4. ETOH abuse -reports she has cut back on her wine intake, going from 2 full glasses to 1 a night. Discussed cutting back more and eventually cessation. She is agreeable to cutting down to half a glass at night -continue on [id number] and folic acid supplement  5. Memory loss  -MMSE 26/30 which is a decline from last year and 6 months ago with the [place] has been ordered but she does not pick up the phone for unknown callers so the number for [facility] was provided and it was encouraged that she call and set up appt.  -[id number] and folic acid was added to regimen  6. Hypertension Blood pressure 158/84 on recheck, pt unsure if she took medication prior to OV, question compliance with this. She also reports she is unable to take blood pressure at home. Encouraged dietary modifications with DASH diet.   7. Weight loss -weight loss noted in the last 2 weeks will have to follow this. Encouraged her to make sure she was eating 3 meals a day.  8. B12 def Started on supplement after last labs  Next appt: 2 weeks. Carlos American. [place], [facility] [phone]

## 2019-04-15 NOTE — Progress Notes (Signed)
CARDIOLOGY OFFICE NOTE  Date:  04/18/2019    Jodi Frank Date of Birth: 11-09-1940 Medical Record [id number]  PCP:  [name], NP  Cardiologist:  Radford Pax   Chief Complaint  Patient presents with  . Loss of Consciousness    Work in visit - seen for Dr. Radford Pax    History of Present Illness: Jodi Frank is a 78 y.o. female who presents today for a work in visit. Seen for Dr. Radford Pax.   She has a history of MR with prior mechanical MVR, PAF, chronic anticoagulation with Warfarin - managed by PCP. Noted history of falls, alcohol abuse and memory disorder.   Seen here in June of 2019 by Dr. Radford Pax to establish cardiology care. Echo was ordered but looks to have never been completed - last study from 04/2017.   The patient does not have symptoms concerning for COVID-19 infection (fever, chills, cough, or new shortness of breath).   Comes in today. Here alone. She is not able to tell me why she is here. She admits that she mixes up instructions for medicines and forgets to take things. She lives alone. No recent falls but she does remember that she has fallen. She has daily alcohol use. She is still driving a car. She has one daughter - she thinks she's coming down the week of December 7 - but she is not sure. Looks like she has follow up and a CT of the head on the 11th - hoping that this is when her daughter is here. She says she feels ok. No chest pain. Breathing is ok. She is using a cane.   Past Medical History:  Diagnosis Date  . Broken hip ([place]) 10/2011   left  . Broken wrist   . Depression   . Diarrhea   . H/O long-term (current) use of anticoagulants   . High cholesterol   . Hypertension   . Memory difficulty 01/18/2016  . 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 Osteoporosis   . SIADH (syndrome of inappropriate ADH production) ([place])   . Urinary retention    chronic  . Vertigo 01/18/2016    Past Surgical History:  Procedure Laterality Date  .  HEMATOMA EVACUATION Left 11/25/2012   Procedure: EVACUATION HEMATOMA;  Surgeon: Linna Hoff, MD;  Location: [facility];  Service: Orthopedics;  Laterality: Left;  . HIP PINNING,CANNULATED Left 11/03/2012   Procedure: CANNULATED HIP PINNING;  Surgeon: Tobi Bastos, MD;  [facility];  Service: Orthopedics;  Laterality: Left;  . IR INJECT/THERA/INC NEEDLE/CATH/PLC EPI/LUMB/SAC W/IMG  07/20/2017  . 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  . OPEN REDUCTION INTERNAL FIXATION (ORIF) WRIST WITH ILIAC CREST BONE GRAFT Left 11/24/2012   Procedure: OPEN REDUCTION INTERNAL FIXATION LEFT WRIST POSSIBLE BONE GRAFTING AND PINNING (ORIF) ;  Surgeon: Linna Hoff, MD;  Location: [facility];  Service: Orthopedics;  Laterality: Left;  . SHOULDER OPEN ROTATOR CUFF REPAIR       Medications: Current Meds  Medication Sig  . alendronate (FOSAMAX) 70 MG tablet Take 1 tablet (70 mg total) by mouth every Sunday. Take with a full glass of water on an empty stomach.  Marland Kitchen aspirin EC 81 MG tablet Take 1 tablet (81 mg total) by mouth daily.  Marland Kitchen atorvastatin (LIPITOR) 10 MG tablet TAKE ONE TABLET BY MOUTH EVERY NIGHT AT BEDTIME  . folic acid (FOLVITE) 1 MG tablet Take 1 tablet (1 mg  total) by mouth daily.  . hydrALAZINE (APRESOLINE) 50 MG tablet TAKE ONE TABLET BY MOUTH THREE TIMES A DAY  . meclizine (ANTIVERT) 25 MG tablet Take 1 tablet (25 mg total) by mouth every 8 (eight) hours as needed for dizziness.  Marland Kitchen spironolactone (ALDACTONE) 25 MG tablet Take 0.5 tablets (12.5 mg total) by mouth daily.  . vitamin B-12 (CYANOCOBALAMIN) 1000 MCG tablet Take 1 tablet (1,000 mcg total) by mouth daily.  Marland Kitchen warfarin (COUMADIN) 4 MG tablet TAKE ONE TABLET BY MOUTH EVERY EVENING AS DIRECTED     Allergies: Allergies  Allergen Reactions  . Carvedilol Itching  . Ace Inhibitors Other (See Comments)    Reaction (??)  . Amlodipine Swelling  . Hydrochlorothiazide Other (See Comments)    Hyponatremia   . Metoprolol Other (See  Comments)    Dizziness     Social History: The patient  reports that she has never smoked. She has never used smokeless tobacco. She reports current alcohol use of about 7.0 standard drinks of alcohol per week. She reports that she does not use drugs.   Family History: The patient's family history includes Colon cancer in her paternal grandmother; Hypertension in her mother; Parkinsonism in her brother; Stroke in her father and mother.   Review of Systems: Please see the history of present illness.   All other systems are reviewed and negative.   Physical Exam: VS:  BP (!) 150/80   Pulse 93   Ht 5\' 3"  (1.6 m)   Wt 151 lb 6.4 oz (68.7 kg)   SpO2 99%   BMI 26.82 kg/m  .  BMI Body mass index is 26.82 kg/m.  Wt Readings from Last 3 Encounters:  04/18/19 151 lb 6.4 oz (68.7 kg)  04/18/19 153 lb (69.4 kg)  04/04/19 152 lb (68.9 kg)   BP is 140/80 by me.   General: Pleasant. Has memory deficit noted. She is not in any acute distress.   HEENT: Normal.  Neck: Supple, no JVD, carotid bruits, or masses noted.  Cardiac: Irregular - rate is ok. Valve is crisp.  No edema.  Respiratory:  Lungs are clear to auscultation bilaterally with normal work of breathing.  GI: Soft and nontender.  MS: No deformity or atrophy. Gait and ROM intact. She has a cane but she is carrying it and not actually using it.  Skin: Warm and dry. Color is normal.  Neuro:  Strength and sensation are intact and no gross focal deficits noted.  Psych: Alert, appropriate and with normal affect.   LABORATORY DATA:  EKG:  EKG is ordered today. This demonstrates atrial flutter with variable block - HR is 89.  Lab Results  Component Value Date   WBC 8.7 03/21/2019   HGB 14.5 03/21/2019   HCT 44.3 03/21/2019   PLT 377 03/21/2019   GLUCOSE 102 03/21/2019   CHOL 175 10/19/2018   TRIG 60 10/19/2018   HDL 76 10/19/2018   LDLCALC 85 10/19/2018   ALT 12 03/21/2019   AST 17 03/21/2019   NA 136 03/21/2019   K 4.3  03/21/2019   CL 101 03/21/2019   CREATININE 0.80 03/21/2019   BUN 15 03/21/2019   CO2 22 03/21/2019   TSH 1.24 03/21/2019   INR 2.9 04/18/2019   HGBA1C 5.4 10/19/2018     BNP (last 3 results) No results for input(s): BNP in the last 8760 hours.  ProBNP (last 3 results) No results for input(s): PROBNP in the last 8760 hours.   Other Studies  Reviewed Today:  Echo Study Conclusions December 2018  - Left ventricle: The cavity size was normal. Wall thickness was   normal. Systolic function was vigorous. The estimated ejection   fraction was in the range of 65% to 70%. Wall motion was normal;   there were no regional wall motion abnormalities. - Aortic valve: There was mild regurgitation. - Mitral valve: A mechanical prosthesis was present. Valve area by   continuity equation (using LVOT flow): 1.57 cm^2. - Left atrium: The atrium was severely dilated. - Right atrium: The atrium was moderately dilated. - Pulmonary arteries: Systolic pressure was moderately increased.   PA peak pressure: 56 mm Hg (S).  Recommendations:  Vigorous LV systolic function; elevated LVOT velocity of 2.2 m/s likely related to vigorous LV systolic function; mild AI; s/p MVR with elevated mean gradient of 13 mmHg; no MR; biatrial enlargement; mild TR with moderate pulmonary Hypertension.   Assessment/Plan:  1.   Persistent atrial fibrillation/flutter - her rate is fair - she was previously on Toprol - currently appears to not be taking - not really clear to me as to why. Rate is ok today.  Has severe LAE making restoration to NSR less likely.   2. HTN - BP is fair here - she is not really able to tell me what she is taking - I think medication compliance is an issue. She needs oversight to ensure what she is taking is correct.   3. MR with prior MVR - on chronic coumadin - last echo 2 years ago. She is also on baby aspirin. She has no CHF symptoms noted. I do not see where updating her echo at this  time will change our focus.   4. Memory disorder - crux of issues - safety needs to be the focus - she should not be driving. Needs oversight into her medicines. Unclear what her home situation is like. She tells me her daughter is coming and will be meeting with PCP for further discussion. I suspect she needs higher level of supervision.   5. Chronic coumadin - noted difficulty with instructions - she admits this too.   6. Alcohol abuse - probably has impacted her memory issue.   7. COVID-19 Education: The signs and symptoms of COVID-19 were discussed with the patient and how to seek care for testing (follow up with PCP or arrange E-visit).  The importance of social distancing, staying at home, hand hygiene and wearing a mask when out in public were discussed today.  Current medicines are reviewed with the patient today.  The patient does not have concerns regarding medicines other than what has been noted above.  The following changes have been made:  See above.  Labs/ tests ordered today include:   No orders of the defined types were placed in this encounter.    Disposition:   FU with [place] in a few months. Overall prognosis tenuous at best.   Patient is agreeable to this plan and will call if any problems develop in the interim.   SignedTruitt Merle, NP  04/18/2019 3:02 PM  [facility] [address] [place], [place]  [address] Phone: [phone] Fax: [phone]

## 2019-04-17 ENCOUNTER — Other Ambulatory Visit: Payer: Self-pay | Admitting: Family

## 2019-04-17 DIAGNOSIS — I480 Paroxysmal atrial fibrillation: Secondary | ICD-10-CM

## 2019-04-17 DIAGNOSIS — Z7901 Long term (current) use of anticoagulants: Secondary | ICD-10-CM

## 2019-04-17 DIAGNOSIS — Z952 Presence of prosthetic heart valve: Secondary | ICD-10-CM

## 2019-04-18 ENCOUNTER — Encounter: Payer: Self-pay | Admitting: Nurse Practitioner

## 2019-04-18 ENCOUNTER — Other Ambulatory Visit: Payer: Self-pay

## 2019-04-18 ENCOUNTER — Ambulatory Visit (INDEPENDENT_AMBULATORY_CARE_PROVIDER_SITE_OTHER): Payer: Medicare Other | Admitting: Nurse Practitioner

## 2019-04-18 VITALS — BP 150/80 | HR 93 | Ht 63.0 in | Wt 151.4 lb

## 2019-04-18 VITALS — BP 144/90 | HR 89 | Temp 98.0°F | Ht 63.0 in | Wt 153.0 lb

## 2019-04-18 DIAGNOSIS — Z7901 Long term (current) use of anticoagulants: Secondary | ICD-10-CM

## 2019-04-18 DIAGNOSIS — I4819 Other persistent atrial fibrillation: Secondary | ICD-10-CM

## 2019-04-18 DIAGNOSIS — R413 Other amnesia: Secondary | ICD-10-CM | POA: Diagnosis not present

## 2019-04-18 DIAGNOSIS — Z952 Presence of prosthetic heart valve: Secondary | ICD-10-CM

## 2019-04-18 DIAGNOSIS — I10 Essential (primary) hypertension: Secondary | ICD-10-CM | POA: Diagnosis not present

## 2019-04-18 DIAGNOSIS — E538 Deficiency of other specified B group vitamins: Secondary | ICD-10-CM

## 2019-04-18 DIAGNOSIS — F1010 Alcohol abuse, uncomplicated: Secondary | ICD-10-CM | POA: Diagnosis not present

## 2019-04-18 DIAGNOSIS — R634 Abnormal weight loss: Secondary | ICD-10-CM

## 2019-04-18 DIAGNOSIS — I480 Paroxysmal atrial fibrillation: Secondary | ICD-10-CM

## 2019-04-18 LAB — POCT INR: INR: 2.9 (ref 2.0–3.0)

## 2019-04-18 MED ORDER — VITAMIN B-12 1000 MCG PO TABS: 1000.0000 ug | ORAL_TABLET | Freq: Every day | ORAL | 0 refills | Status: DC

## 2019-04-18 MED ORDER — FOLIC ACID 1 MG PO TABS: 1.0000 mg | ORAL_TABLET | Freq: Every day | ORAL | 1 refills | Status: DC

## 2019-04-18 MED ORDER — FOLIC ACID 1 MG PO TABS: 1.0000 mg | ORAL_TABLET | Freq: Every day | ORAL | 0 refills | Status: DC

## 2019-04-18 MED ORDER — VITAMIN B-12 1000 MCG PO TABS: 1000.0000 ug | ORAL_TABLET | Freq: Every day | ORAL | 1 refills | Status: DC

## 2019-04-18 NOTE — Patient Instructions (Addendum)
TO CALL [facility] and schedule head CT Phone: [phone]  To continue coumadin 4 mg daily   Continue to decrease your wine in the evening- 1/2 a glass or LESS every night.   Follow up on Dec 9th for INR check and routine follow up.

## 2019-04-18 NOTE — Telephone Encounter (Signed)
High risk or very high risk warning populated when attempting to refill medication. RX request sent to PCP for review and approval if warranted.   

## 2019-04-18 NOTE — Progress Notes (Signed)
Careteam: Patient Care Team: [name], NP as PCP - General (Geriatric Medicine) Sueanne Margarita, MD as Consulting Physician (Cardiology)  Advanced Directive information    Allergies  Allergen Reactions  . Carvedilol Itching  . Ace Inhibitors Other (See Comments)    Reaction (??)  . Amlodipine Swelling  . Hydrochlorothiazide Other (See Comments)    Hyponatremia   . Metoprolol Other (See Comments)    Dizziness     Chief Complaint  Patient presents with  . Anticoagulation    2 week PT/INR (2.9)     HPI: Patient is a 78 y.o. female seen in the office today for INR check. Pt with hx of MVR with mechanical valve and a fib. Pt with ETOH abuse and recently with increase in memory loss and falls.  Reports she does not remember that she was supposed to only take 2 mg on Mondays. Pt reports she does not remember the directions at the last visit. Last INR 3.5 and coumadin was decrease to 4 mg daily with 2 mg on Monday. Pt states she is taking 4 mg daily.   INR today has improved to 2.9. GOAL INR 2.5-3.5  Lab Results  Component Value Date   INR 2.9 04/18/2019   INR 3.5 (A) 04/04/2019   INR 1.6 (A) 03/21/2019   PROTIME 24.3 (A) 02/27/2017   She had been educated to decrease alcohol intake and decreased from 2 full glasses to 1 full glass of wine a night. Now drinking 3/4 of a glass at night.  Memory continues to show signs of ongoing decline. Reports she is taking b12 and folate supplement but ran out.   No additional falls.   Review of Systems:  Review of Systems  Constitutional: Negative for chills, fever and weight loss.  Respiratory: Negative for cough, hemoptysis, sputum production and shortness of breath.   Cardiovascular: Negative for chest pain, palpitations and leg swelling.  Gastrointestinal: Negative for abdominal pain, blood in stool, constipation, diarrhea, heartburn and melena.  Genitourinary: Negative for dysuria, frequency and urgency.   Musculoskeletal: Negative for falls.       Pt denies any additional falls  Skin: Negative.   Neurological: Negative for dizziness, weakness and headaches.  Endo/Heme/Allergies: Does not bruise/bleed easily.       No excessive bleeding or bruising.   Psychiatric/Behavioral: Positive for memory loss.    Past Medical History:  Diagnosis Date  . Broken hip ([place]) 10/2011   left  . Broken wrist   . Depression   . Diarrhea   . H/O long-term (current) use of anticoagulants   . High cholesterol   . Hypertension   . Memory difficulty 01/18/2016  . 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 Osteoporosis   . SIADH (syndrome of inappropriate ADH production) ([place])   . Urinary retention    chronic  . Vertigo 01/18/2016   Past Surgical History:  Procedure Laterality Date  . HEMATOMA EVACUATION Left 11/25/2012   Procedure: EVACUATION HEMATOMA;  Surgeon: Linna Hoff, MD;  Location: [place];  Service: Orthopedics;  Laterality: Left;  . HIP PINNING,CANNULATED Left 11/03/2012   Procedure: CANNULATED HIP PINNING;  Surgeon: Tobi Bastos, MD;  Location: [facility];  Service: Orthopedics;  Laterality: Left;  . IR INJECT/THERA/INC NEEDLE/CATH/PLC EPI/LUMB/SAC W/IMG  07/20/2017  . 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  . OPEN REDUCTION INTERNAL FIXATION (ORIF) WRIST WITH ILIAC CREST BONE GRAFT Left 11/24/2012   Procedure:  OPEN REDUCTION INTERNAL FIXATION LEFT WRIST POSSIBLE BONE GRAFTING AND PINNING (ORIF) ;  Surgeon: Linna Hoff, MD;  Location: [place];  Service: Orthopedics;  Laterality: Left;  . SHOULDER OPEN ROTATOR CUFF REPAIR     Social History:   reports that she has never smoked. She has never used smokeless tobacco. She reports current alcohol use of about 7.0 standard drinks of alcohol per week. She reports that she does not use drugs.  Family History  Problem Relation Age of Onset  . Stroke Mother   . Hypertension Mother   . Stroke Father   . Colon cancer Paternal Grandmother    . Parkinsonism Brother     Medications: Patient's Medications  New Prescriptions   No medications on file  Previous Medications   ALENDRONATE (FOSAMAX) 70 MG TABLET    Take 1 tablet (70 mg total) by mouth every Sunday. Take with a full glass of water on an empty stomach.   ASPIRIN EC 81 MG TABLET    Take 1 tablet (81 mg total) by mouth daily.   ATORVASTATIN (LIPITOR) 10 MG TABLET    TAKE ONE TABLET BY MOUTH EVERY NIGHT AT BEDTIME   FOLIC ACID (FOLVITE) 1 MG TABLET    Take 1 tablet (1 mg total) by mouth daily.   HYDRALAZINE (APRESOLINE) 50 MG TABLET    TAKE ONE TABLET BY MOUTH THREE TIMES A DAY   MECLIZINE (ANTIVERT) 25 MG TABLET    Take 1 tablet (25 mg total) by mouth every 8 (eight) hours as needed for dizziness.   SPIRONOLACTONE (ALDACTONE) 25 MG TABLET    Take 0.5 tablets (12.5 mg total) by mouth daily.   VITAMIN B-12 (CYANOCOBALAMIN) 1000 MCG TABLET    Take 1 tablet (1,000 mcg total) by mouth daily.   WARFARIN (COUMADIN) 4 MG TABLET    TAKE ONE TABLET BY MOUTH EVERY EVENING AS DIRECTED  Modified Medications   No medications on file  Discontinued Medications   No medications on file    Physical Exam:  Vitals:   04/18/19 1135  BP: (!) 144/90  Pulse: 89  Temp: 98 F (36.7 C)  TempSrc: Temporal  SpO2: 97%  Weight: 153 lb (69.4 kg)  Height: 5\' 3"  (1.6 m)   Body mass index is 27.1 kg/m. Wt Readings from Last 3 Encounters:  04/18/19 153 lb (69.4 kg)  04/04/19 152 lb (68.9 kg)  03/21/19 156 lb (70.8 kg)    Physical Exam Constitutional:      General: She is not in acute distress.    Appearance: She is well-developed. She is not diaphoretic.  HENT:     Head: Normocephalic and atraumatic.     Mouth/Throat:     Pharynx: No oropharyngeal exudate.  Eyes:     Conjunctiva/sclera: Conjunctivae normal.     Pupils: Pupils are equal, round, and reactive to light.  Neck:     Musculoskeletal: Normal range of motion and neck supple.  Cardiovascular:     Rate and Rhythm:  Normal rate. Rhythm irregular.     Heart sounds: Murmur present.  Pulmonary:     Effort: Pulmonary effort is normal.     Breath sounds: Normal breath sounds.  Abdominal:     General: Bowel sounds are normal.     Palpations: Abdomen is soft.  Musculoskeletal:        General: No tenderness.  Skin:    General: Skin is warm and dry.  Neurological:     Mental Status: She is alert  and oriented to person, place, and time.  Psychiatric:        Cognition and Memory: Cognition is impaired. Memory is not impaired.     Labs reviewed: Basic Metabolic Panel: Recent Labs    07/20/18 1400 09/07/18 1208 03/21/19 1600  NA 134* 136 136  K 4.2 4.1 4.3  CL 100 102 101  CO2 23 26 22   GLUCOSE 108 116 102  BUN 18 17 15   CREATININE 0.83 0.73 0.80  CALCIUM 10.2 10.1 9.8  TSH  --   --  1.24   Liver Function Tests: Recent Labs    03/21/19 1600  AST 17  ALT 12  BILITOT 1.6*  PROT 7.0   No results for input(s): LIPASE, AMYLASE in the last 8760 hours. Recent Labs    03/21/19 1600  AMMONIA 34   CBC: Recent Labs    09/07/18 1208 03/21/19 1600  WBC 7.4 8.7  NEUTROABS 5,513 6,342  HGB 15.3 14.5  HCT 46.7* 44.3  MCV 87.5 85.9  PLT 345 377   Lipid Panel: Recent Labs    10/19/18 1203  CHOL 175  HDL 76  LDLCALC 85  TRIG 60  CHOLHDL 2.3   TSH: Recent Labs    03/21/19 1600  TSH 1.24   A1C: Lab Results  Component Value Date   HGBA1C 5.4 10/19/2018     Assessment/Plan 1. Long term current use of anticoagulant - POC INR 2.9 today, reports she has been taking coumain 4 mg daily. "did not read" new dosing on last AVS and forgot the instructions that were given in office. Daughter was notified and concern brought up over medication compliance esp with coumadin and risk for bleeding. Daughter plans to visit in the next month.   2. B12 deficiency - vitamin B-12 (CYANOCOBALAMIN) 1000 MCG tablet; Take 1 tablet (1,000 mcg total) by mouth daily.  Dispense: 90 tablet; Refill: 1  - folic acid (FOLVITE) 1 MG tablet; Take 1 tablet (1 mg total) by mouth daily.  Dispense: 90 tablet; Refill: 1  3. ETOH abuse -worsening memory loss noted and ETOH intake could be exacerbating encouraged to continue to cut back and cessation.  - vitamin B-12 (CYANOCOBALAMIN) 1000 MCG tablet; Take 1 tablet (1,000 mcg total) by mouth daily.  Dispense: 90 tablet; Refill: 1 - folic acid (FOLVITE) 1 MG tablet; Take 1 tablet (1 mg total) by mouth daily.  Dispense: 90 tablet; Refill: 1  4. H/O mitral valve replacement with mechanical valve Continues on coumadin. INR goal 2.5-3.5. INR at goal today. Follow up scheduled with cardiology today  5. Paroxysmal atrial fibrillation (HCC) -rate controlled without medication. Continues on coumadin for anticoagulation. Discussed importance of medication compliance however memory loss is a barrier for this. Has follow up with cardiology today  6. Memory loss -pt does not remember that she was instructed to call and make appt. Called daughter and she reports that she did make appt but cancelled. Daughter will help assist in getting CT of the head done while he is here. She is also looking into other arrangements for safety.   7. Weight loss -stable at this time.  Next appt: 2 weeks- daughter will be at visit.  Carlos American. [place], [facility] [phone]

## 2019-04-18 NOTE — Addendum Note (Signed)
Addended by: Jeremy Johann on: 04/18/2019 03:42 PM   Modules accepted: Orders

## 2019-04-18 NOTE — Patient Instructions (Addendum)
After Visit Summary:  We will be checking the following labs today - NONE   Medication Instructions:    Continue with your current medicines.   We need for your daughter to go over your medicines when she gets here to make sure they are accurate.    If you need a refill on your cardiac medications before your next appointment, please call your pharmacy.     Testing/Procedures To Be Arranged:  N/A  Follow-Up:   See Dr. Radford Pax in 3 to 4 months.    At [facility], you and your health needs are our priority.  As part of our continuing mission to provide you with exceptional heart care, we have created designated Provider Care Teams.  These Care Teams include your primary Cardiologist (physician) and Advanced Practice Providers (APPs -  Physician Assistants and Nurse Practitioners) who all work together to provide you with the care you need, when you need it.  Special Instructions:  . Stay safe, stay home, wash your hands for at least 20 seconds and wear a mask when out in public.  . It was good to talk with you today.    Call the [place] office at [phone] if you have any questions, problems or concerns.

## 2019-04-19 ENCOUNTER — Encounter: Payer: Self-pay | Admitting: Nurse Practitioner

## 2019-04-19 NOTE — Telephone Encounter (Signed)
I replied to patient, please add any additional comments if necessary

## 2019-04-19 NOTE — Progress Notes (Signed)
She is on her coumadin - monitored by PCP.  Daughter is coming early next month and meeting with PCP for discussion.   My note went to Sherrie Mustache, who is following her.   Cecille Rubin

## 2019-04-24 ENCOUNTER — Encounter: Payer: Self-pay | Admitting: Nurse Practitioner

## 2019-05-04 ENCOUNTER — Ambulatory Visit: Payer: Medicare Other | Admitting: Nurse Practitioner

## 2019-05-06 ENCOUNTER — Encounter: Payer: Self-pay | Admitting: Nurse Practitioner

## 2019-05-06 ENCOUNTER — Other Ambulatory Visit: Payer: Self-pay

## 2019-05-06 ENCOUNTER — Ambulatory Visit (INDEPENDENT_AMBULATORY_CARE_PROVIDER_SITE_OTHER): Payer: Medicare Other | Admitting: Nurse Practitioner

## 2019-05-06 ENCOUNTER — Ambulatory Visit
Admission: RE | Admit: 2019-05-06 | Discharge: 2019-05-06 | Disposition: A | Discharge status: Home / Self Care | Payer: Medicare Other | Source: Ambulatory Visit | Attending: Nurse Practitioner | Admitting: Nurse Practitioner

## 2019-05-06 VITALS — BP 128/80 | HR 76 | Temp 97.1°F | Ht 63.0 in | Wt 149.0 lb

## 2019-05-06 DIAGNOSIS — R413 Other amnesia: Secondary | ICD-10-CM

## 2019-05-06 DIAGNOSIS — Z952 Presence of prosthetic heart valve: Secondary | ICD-10-CM | POA: Diagnosis not present

## 2019-05-06 DIAGNOSIS — I480 Paroxysmal atrial fibrillation: Secondary | ICD-10-CM

## 2019-05-06 DIAGNOSIS — Z7901 Long term (current) use of anticoagulants: Secondary | ICD-10-CM | POA: Diagnosis not present

## 2019-05-06 DIAGNOSIS — I10 Essential (primary) hypertension: Secondary | ICD-10-CM

## 2019-05-06 DIAGNOSIS — W19XXXD Unspecified fall, subsequent encounter: Secondary | ICD-10-CM

## 2019-05-06 DIAGNOSIS — E538 Deficiency of other specified B group vitamins: Secondary | ICD-10-CM | POA: Diagnosis not present

## 2019-05-06 DIAGNOSIS — F1010 Alcohol abuse, uncomplicated: Secondary | ICD-10-CM

## 2019-05-06 LAB — POCT INR: INR: 4.4 — AB (ref 2.0–3.0)

## 2019-05-06 MED ORDER — WARFARIN SODIUM 4 MG PO TABS: ORAL_TABLET | ORAL | 0 refills | Status: DC

## 2019-05-06 NOTE — Patient Instructions (Addendum)
Decreased coumadin to 2 mg today (Friday), tomorrow (Saturday) and Sunday- eat some greens tonight due to HIGH INR  On Monday-- Start 4 mg daily except 2 mg on Tuesday and Friday   FOLLOW UP INR in 1 week  No more WINE

## 2019-05-06 NOTE — Progress Notes (Signed)
Careteam: Patient Care Team: [name], NP as PCP - General (Geriatric Medicine) Sueanne Margarita, MD as Consulting Physician (Cardiology)  Advanced Directive information    Allergies  Allergen Reactions  . Carvedilol Itching  . Ace Inhibitors Other (See Comments)    Reaction (??)  . Amlodipine Swelling  . Hydrochlorothiazide Other (See Comments)    Hyponatremia   . Metoprolol Other (See Comments)    Dizziness     Chief Complaint  Patient presents with  . Anticoagulation    PT/INR check. Per patient, no missed doses, no diet changes, and no bleeding. Here with daughter [name]  . Quality Metric Gaps    Discuss need for colonoscopy      HPI: Patient is a 78 y.o. female seen in the office today for INR and routine follow up. Daughter is here with her today- she leaves tomorrow.   INR elevated to 4.4 today. GOAL 2.5-3.5 Has cut back to half a glass but not having wine routinely. Pt reports no recent fall- but LARGE bruise to left hip and left forearm- she then admits she feels like she had a fall but can not remember. Admits to having a hard time with coumadin.   Ongoing falls- pt has had multiple falls- does not remember however. She has had hx of falls and in the past ended up hospitalized due to traumatic head injury. Unsure if she hit her head during last fall- unsure when fall even was- daughter has been there fore 3 days so felt like it was prior to this but bruise is dark purple so felt like not that old. No changes LOS noted. No dizziness, headaches, blurred vision noted. Had CT of her head to evaluate memory loss earlier today.    Reports good appetite. Eating more junk food- more than ever per daughter. Daughter noticed a different types of food in the house than what she is used to.  Has a lot of junk food on the counter.  Daughter states there are a lot of bills on the counter House maintenance getting away from her per daughter.   Reports she just did not  feel like doing cologuard - does not wish to do any more screenings for colorectal cancer  Review of Systems:  Review of Systems  Constitutional: Positive for weight loss (reports more walking). Negative for chills, fever and malaise/fatigue.  Respiratory: Negative for shortness of breath.   Cardiovascular: Negative for chest pain, palpitations and leg swelling.  Musculoskeletal: Negative for joint pain and myalgias.  Neurological: Negative for dizziness and headaches.  Psychiatric/Behavioral: Positive for memory loss. Negative for depression. The patient is not nervous/anxious and does not have insomnia.     Past Medical History:  Diagnosis Date  . Broken hip ([place]) 10/2011   left  . Broken wrist   . Depression   . Diarrhea   . H/O long-term (current) use of anticoagulants   . High cholesterol   . Hypertension   . Memory difficulty 01/18/2016  . 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 Osteoporosis   . SIADH (syndrome of inappropriate ADH production) ([place])   . Urinary retention    chronic  . Vertigo 01/18/2016   Past Surgical History:  Procedure Laterality Date  . HEMATOMA EVACUATION Left 11/25/2012   Procedure: EVACUATION HEMATOMA;  Surgeon: Linna Hoff, MD;  Location: [place];  Service: Orthopedics;  Laterality: Left;  . HIP PINNING,CANNULATED Left 11/03/2012   Procedure: CANNULATED HIP  PINNING;  Surgeon: Tobi Bastos, MD;  Location: [facility];  Service: Orthopedics;  Laterality: Left;  . IR INJECT/THERA/INC NEEDLE/CATH/PLC EPI/LUMB/SAC W/IMG  07/20/2017  . 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  . OPEN REDUCTION INTERNAL FIXATION (ORIF) WRIST WITH ILIAC CREST BONE GRAFT Left 11/24/2012   Procedure: OPEN REDUCTION INTERNAL FIXATION LEFT WRIST POSSIBLE BONE GRAFTING AND PINNING (ORIF) ;  Surgeon: Linna Hoff, MD;  Location: [place];  Service: Orthopedics;  Laterality: Left;  . SHOULDER OPEN ROTATOR CUFF REPAIR     Social History:   reports that she has never  smoked. She has never used smokeless tobacco. She reports current alcohol use of about 7.0 standard drinks of alcohol per week. She reports that she does not use drugs.  Family History  Problem Relation Age of Onset  . Stroke Mother   . Hypertension Mother   . Stroke Father   . Colon cancer Paternal Grandmother   . Parkinsonism Brother     Medications: Patient's Medications  New Prescriptions   No medications on file  Previous Medications   ALENDRONATE (FOSAMAX) 70 MG TABLET    Take 1 tablet (70 mg total) by mouth every Sunday. Take with a full glass of water on an empty stomach.   ASPIRIN EC 81 MG TABLET    Take 1 tablet (81 mg total) by mouth daily.   ATORVASTATIN (LIPITOR) 10 MG TABLET    TAKE ONE TABLET BY MOUTH EVERY NIGHT AT BEDTIME   FOLIC ACID (FOLVITE) 1 MG TABLET    Take 1 tablet (1 mg total) by mouth daily.   HYDRALAZINE (APRESOLINE) 50 MG TABLET    TAKE ONE TABLET BY MOUTH THREE TIMES A DAY   MECLIZINE (ANTIVERT) 25 MG TABLET    Take 1 tablet (25 mg total) by mouth every 8 (eight) hours as needed for dizziness.   SPIRONOLACTONE (ALDACTONE) 25 MG TABLET    Take 0.5 tablets (12.5 mg total) by mouth daily.   VITAMIN B-12 (CYANOCOBALAMIN) 1000 MCG TABLET    Take 1 tablet (1,000 mcg total) by mouth daily.   WARFARIN (COUMADIN) 4 MG TABLET    TAKE ONE TABLET BY MOUTH EVERY EVENING AS DIRECTED  Modified Medications   No medications on file  Discontinued Medications   No medications on file    Physical Exam:  Vitals:   05/06/19 1502  BP: 128/80  Pulse: 76  Temp: (!) 97.1 F (36.2 C)  TempSrc: Temporal  SpO2: 97%  Weight: 149 lb (67.6 kg)  Height: 5\' 3"  (1.6 m)   Body mass index is 26.39 kg/m. Wt Readings from Last 3 Encounters:  05/06/19 149 lb (67.6 kg)  04/18/19 151 lb 6.4 oz (68.7 kg)  04/18/19 153 lb (69.4 kg)    Physical Exam Constitutional:      General: She is not in acute distress.    Appearance: She is well-developed. She is not diaphoretic.  HENT:      Head: Normocephalic and atraumatic.  Eyes:     Conjunctiva/sclera: Conjunctivae normal.     Pupils: Pupils are equal, round, and reactive to light.  Cardiovascular:     Rate and Rhythm: Normal rate. Rhythm irregular.     Heart sounds: Murmur present.  Pulmonary:     Effort: Pulmonary effort is normal.     Breath sounds: Normal breath sounds.  Abdominal:     General: Bowel sounds are normal.     Palpations: Abdomen is soft.  Musculoskeletal:  General: No tenderness.     Cervical back: Normal range of motion and neck supple.     Right hip: Normal. No deformity, lacerations or tenderness. Normal range of motion.     Right upper leg: Normal.  Skin:    General: Skin is warm and dry.     Findings: Bruising and ecchymosis present.       Neurological:     Mental Status: She is alert and oriented to person, place, and time. Mental status is at baseline.     Cranial Nerves: No cranial nerve deficit.     Sensory: No sensory deficit.     Gait: Gait normal.  Psychiatric:        Mood and Affect: Mood normal.        Cognition and Memory: Cognition is impaired.     Labs reviewed: Basic Metabolic Panel: Recent Labs    07/20/18 1400 09/07/18 1208 03/21/19 1600  NA 134* 136 136  K 4.2 4.1 4.3  CL 100 102 101  CO2 23 26 22   GLUCOSE 108 116 102  BUN 18 17 15   CREATININE 0.83 0.73 0.80  CALCIUM 10.2 10.1 9.8  TSH  --   --  1.24   Liver Function Tests: Recent Labs    03/21/19 1600  AST 17  ALT 12  BILITOT 1.6*  PROT 7.0   No results for input(s): LIPASE, AMYLASE in the last 8760 hours. Recent Labs    03/21/19 1600  AMMONIA 34   CBC: Recent Labs    09/07/18 1208 03/21/19 1600  WBC 7.4 8.7  NEUTROABS 5,513 6,342  HGB 15.3 14.5  HCT 46.7* 44.3  MCV 87.5 85.9  PLT 345 377   Lipid Panel: Recent Labs    10/19/18 1203  CHOL 175  HDL 76  LDLCALC 85  TRIG 60  CHOLHDL 2.3   TSH: Recent Labs    03/21/19 1600  TSH 1.24   A1C: Lab Results    Component Value Date   HGBA1C 5.4 10/19/2018     Assessment/Plan 1. Long term current use of anticoagulant - POC INR-- elevated today at 4.4, goal 2.5-3.5- will have her reduce coumadin to 2 mg daily for 3 days then to start 4 mg daily except 2 mg on Tuesday and Friday. Will have her follow up INR in 1 week. To eat serving of greens tonight as well.   2. Long term (current) use of anticoagulants -due to chronic a fib with Mitral valve replacement with mechanical valve goal 2.5-3.5 - warfarin (COUMADIN) 4 MG tablet; 4 mg daily except Tuesday and Friday take 2 mg  Dispense: 33 tablet; Refill: 0  3. H/O mitral valve replacement with mechanical valve conts on coumadin for a fib and hx of MVR with mechanical valve, discussed risk vs benefits with daughter due to multiple falls and memory deficits with elevated INR at high risk for head injury and bleeding- which she has had in the past. Daughter is aware and plans to have someone come by to help and also plans to move pt closer to her. - warfarin (COUMADIN) 4 MG tablet; 4 mg daily except Tuesday and Friday take 2 mg  Dispense: 33 tablet; Refill: 0  4. Paroxysmal atrial fibrillation (HCC) See number 3 - warfarin (COUMADIN) 4 MG tablet; 4 mg daily except Tuesday and Friday take 2 mg  Dispense: 33 tablet; Refill: 0  5. B12 deficiency -has now started b12 supplement  6. ETOH abuse -daughter noted several bottles of  empty wine. Pt not remembering falls. Feel like wine may have contributed to this. Daughter has encouraged no more wine to pt and I am in agreement of this. Pt agreeable at this time- states she has not had wine in 2 days.   7. Memory loss Ongoing, progressively worsening at this time. Daughter talks about moving her closer to her ([place] of state) to help her more. CT head pending. Discussed use of ETOH could be playing a role.   8. Essential hypertension, benign -well controlled today, continue current regimen  9. Fall,  subsequent encounter Ongoing falls, stress the danger in this with being on anticoagulation. Pt does not remember anything about the fall but was prior to daughters arrival. Plan to look into life alert wrist system and having someone check on her. Encouraged no ETOH as well as this is most likely contributing. Declines PT.    Next appt: 1 week Ortha Metts K. Harle Battiest  [facility] [phone]    Total time 50:  time greater than 50% of total time spent doing pt counseling and coordination of care due to progressive memory loss, anticoagulation and falls.

## 2019-05-12 NOTE — Progress Notes (Signed)
Reviewed CT of the head in detail with daughter

## 2019-05-13 ENCOUNTER — Ambulatory Visit (INDEPENDENT_AMBULATORY_CARE_PROVIDER_SITE_OTHER): Payer: Medicare Other | Admitting: Nurse Practitioner

## 2019-05-13 ENCOUNTER — Other Ambulatory Visit: Payer: Self-pay

## 2019-05-13 ENCOUNTER — Encounter: Payer: Self-pay | Admitting: Nurse Practitioner

## 2019-05-13 VITALS — BP 148/92 | HR 87 | Temp 97.1°F | Ht 63.0 in | Wt 149.0 lb

## 2019-05-13 DIAGNOSIS — Z7901 Long term (current) use of anticoagulants: Secondary | ICD-10-CM

## 2019-05-13 DIAGNOSIS — Z952 Presence of prosthetic heart valve: Secondary | ICD-10-CM | POA: Diagnosis not present

## 2019-05-13 DIAGNOSIS — M810 Age-related osteoporosis without current pathological fracture: Secondary | ICD-10-CM

## 2019-05-13 DIAGNOSIS — I480 Paroxysmal atrial fibrillation: Secondary | ICD-10-CM

## 2019-05-13 DIAGNOSIS — E538 Deficiency of other specified B group vitamins: Secondary | ICD-10-CM

## 2019-05-13 DIAGNOSIS — F0150 Vascular dementia without behavioral disturbance: Secondary | ICD-10-CM

## 2019-05-13 DIAGNOSIS — F1010 Alcohol abuse, uncomplicated: Secondary | ICD-10-CM

## 2019-05-13 LAB — POCT INR: INR: 2.3 (ref 2.0–3.0)

## 2019-05-13 MED ORDER — ALENDRONATE SODIUM 70 MG PO TABS: 70.0000 mg | ORAL_TABLET | ORAL | 3 refills | Status: AC

## 2019-05-13 MED ORDER — VITAMIN B-12 1000 MCG PO TABS: 1000.0000 ug | ORAL_TABLET | Freq: Every day | ORAL | 1 refills | Status: AC

## 2019-05-13 MED ORDER — FOLIC ACID 1 MG PO TABS: 1.0000 mg | ORAL_TABLET | Freq: Every day | ORAL | 1 refills | Status: AC

## 2019-05-13 MED ORDER — DONEPEZIL HCL 5 MG PO TABS: 5.0000 mg | ORAL_TABLET | Freq: Every day | ORAL | 0 refills | Status: DC

## 2019-05-13 NOTE — Patient Instructions (Addendum)
Continue current dose of coumadin- 4 mg daily except 2 mg on Tuesday and Fridays  Follow up INR on Wednesday the 30th at 11:30  Continue to NOT drink wine.   Will start Aricept 5 mg for memory, to take at bedtime

## 2019-05-13 NOTE — Progress Notes (Signed)
Careteam: Patient Care Team: [name], NP as PCP - General (Geriatric Medicine) Sueanne Margarita, MD as Consulting Physician (Cardiology)  Advanced Directive information    Allergies  Allergen Reactions  . Carvedilol Itching  . Ace Inhibitors Other (See Comments)    Reaction (??)  . Amlodipine Swelling  . Hydrochlorothiazide Other (See Comments)    Hyponatremia   . Metoprolol Other (See Comments)    Dizziness     Chief Complaint  Patient presents with  . Anticoagulation    1 week pt/inr check, no missed doses, no bleeding, and no diet changes      HPI: Patient is a 78 y.o. female seen in the office today for INR follow up.   INR today 2.3, down from 4.4 last week. Now taking coumadin 4 mg daily except 2 mg on Tuesday and Friday.  She has stopped drinking wine, no wine in the last week. Otherwise no diet changes. No medication changes. No additional falls. No more bruising.   She has pill box that has helped her stay organized   memory loss- CT of the head reviewed with pt. Noted to have A lacunar infarct in the right basal ganglia is new since 2019, but favored to be chronic. Elsewhere stable gray-white matter differentiation with mild to moderate for age patchy white matter hypodensity.   A fib- rate controlled, not on any rate controlling medication due to tolerability issues with medication causing dizziness.   Long term use of anticoagulation due to a fib and hx of mitral valve replacement with mechanical valve- Continues on coumadin for anticoagulation at last visit was 4.4 with huge bruise on left thigh after    Review of Systems:  Review of Systems  Constitutional: Negative for chills, fever and weight loss.  HENT: Negative for tinnitus.   Respiratory: Negative for cough, sputum production and shortness of breath.   Cardiovascular: Negative for chest pain, palpitations and leg swelling.  Gastrointestinal: Negative for abdominal pain,  constipation, diarrhea and heartburn.  Genitourinary: Negative for dysuria, frequency and urgency.  Musculoskeletal: Negative for back pain, falls, joint pain and myalgias.  Skin: Negative.        Bruising after fall fading, no additional bruising noted.  Neurological: Negative for dizziness and headaches.  Psychiatric/Behavioral: Positive for memory loss. Negative for depression. The patient does not have insomnia.     Past Medical History:  Diagnosis Date  . Broken hip ([place]) 10/2011   left  . Broken wrist   . Depression   . Diarrhea   . H/O long-term (current) use of anticoagulants   . High cholesterol   . Hypertension   . Memory difficulty 01/18/2016  . 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 Osteoporosis   . SIADH (syndrome of inappropriate ADH production) ([place])   . Urinary retention    chronic  . Vertigo 01/18/2016   Past Surgical History:  Procedure Laterality Date  . HEMATOMA EVACUATION Left 11/25/2012   Procedure: EVACUATION HEMATOMA;  Surgeon: Linna Hoff, MD;  Location: [facility];  Service: Orthopedics;  Laterality: Left;  . HIP PINNING,CANNULATED Left 11/03/2012   Procedure: CANNULATED HIP PINNING;  Surgeon: Tobi Bastos, MD;  Location: [facility];  Service: Orthopedics;  Laterality: Left;  . IR INJECT/THERA/INC NEEDLE/CATH/PLC EPI/LUMB/SAC W/IMG  07/20/2017  . 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  . OPEN REDUCTION INTERNAL FIXATION (ORIF) WRIST WITH ILIAC CREST BONE GRAFT Left 11/24/2012  Procedure: OPEN REDUCTION INTERNAL FIXATION LEFT WRIST POSSIBLE BONE GRAFTING AND PINNING (ORIF) ;  Surgeon: Linna Hoff, MD;  Location: [employer];  Service: Orthopedics;  Laterality: Left;  . SHOULDER OPEN ROTATOR CUFF REPAIR     Social History:   reports that she has never smoked. She has never used smokeless tobacco. She reports current alcohol use of about 7.0 standard drinks of alcohol per week. She reports that she does not use drugs.  Family History  Problem  Relation Age of Onset  . Stroke Mother   . Hypertension Mother   . Stroke Father   . Colon cancer Paternal Grandmother   . Parkinsonism Brother     Medications: Patient's Medications  New Prescriptions   No medications on file  Previous Medications   ASPIRIN EC 81 MG TABLET    Take 1 tablet (81 mg total) by mouth daily.   ATORVASTATIN (LIPITOR) 10 MG TABLET    TAKE ONE TABLET BY MOUTH EVERY NIGHT AT BEDTIME   FOLIC ACID (FOLVITE) 1 MG TABLET    Take 1 tablet (1 mg total) by mouth daily.   HYDRALAZINE (APRESOLINE) 50 MG TABLET    TAKE ONE TABLET BY MOUTH THREE TIMES A DAY   MECLIZINE (ANTIVERT) 25 MG TABLET    Take 1 tablet (25 mg total) by mouth every 8 (eight) hours as needed for dizziness.   SPIRONOLACTONE (ALDACTONE) 25 MG TABLET    Take 0.5 tablets (12.5 mg total) by mouth daily.   VITAMIN B-12 (CYANOCOBALAMIN) 1000 MCG TABLET    Take 1 tablet (1,000 mcg total) by mouth daily.   WARFARIN (COUMADIN) 4 MG TABLET    4 mg daily except Tuesday and Friday take 2 mg  Modified Medications   Modified Medication Previous Medication   ALENDRONATE (FOSAMAX) 70 MG TABLET alendronate (FOSAMAX) 70 MG tablet      Take 1 tablet (70 mg total) by mouth every Sunday. Take with a full glass of water on an empty stomach.    Take 1 tablet (70 mg total) by mouth every Sunday. Take with a full glass of water on an empty stomach.  Discontinued Medications   No medications on file    Physical Exam:  Vitals:   05/13/19 1324  BP: (!) 148/92  Pulse: 87  Temp: (!) 97.1 F (36.2 C)  TempSrc: Temporal  SpO2: 97%  Weight: 149 lb (67.6 kg)  Height: 5\' 3"  (1.6 m)   Body mass index is 26.39 kg/m. Wt Readings from Last 3 Encounters:  05/13/19 149 lb (67.6 kg)  05/06/19 149 lb (67.6 kg)  04/18/19 151 lb 6.4 oz (68.7 kg)    Physical Exam Constitutional:      General: She is not in acute distress.    Appearance: She is well-developed. She is not diaphoretic.  HENT:     Head: Normocephalic and  atraumatic.  Eyes:     Conjunctiva/sclera: Conjunctivae normal.     Pupils: Pupils are equal, round, and reactive to light.  Cardiovascular:     Rate and Rhythm: Normal rate. Rhythm irregular.     Heart sounds: Murmur present.  Pulmonary:     Effort: Pulmonary effort is normal.     Breath sounds: Normal breath sounds.  Abdominal:     General: Bowel sounds are normal.     Palpations: Abdomen is soft.  Musculoskeletal:        General: No tenderness.     Cervical back: Normal range of motion and neck supple.  Right hip: Normal. No deformity, lacerations or tenderness. Normal range of motion.     Right upper leg: Normal.  Skin:    General: Skin is warm and dry.     Findings: Bruising (stable from last OV) and ecchymosis present.       Neurological:     Mental Status: She is alert and oriented to person, place, and time. Mental status is at baseline.     Cranial Nerves: No cranial nerve deficit.     Sensory: No sensory deficit.     Gait: Gait normal.  Psychiatric:        Mood and Affect: Mood normal.        Cognition and Memory: Cognition is impaired.     Labs reviewed: Basic Metabolic Panel: Recent Labs    07/20/18 1400 09/07/18 1208 03/21/19 1600  NA 134* 136 136  K 4.2 4.1 4.3  CL 100 102 101  CO2 23 26 22   GLUCOSE 108 116 102  BUN 18 17 15   CREATININE 0.83 0.73 0.80  CALCIUM 10.2 10.1 9.8  TSH  --   --  1.24   Liver Function Tests: Recent Labs    03/21/19 1600  AST 17  ALT 12  BILITOT 1.6*  PROT 7.0   No results for input(s): LIPASE, AMYLASE in the last 8760 hours. Recent Labs    03/21/19 1600  AMMONIA 34   CBC: Recent Labs    09/07/18 1208 03/21/19 1600  WBC 7.4 8.7  NEUTROABS 5,513 6,342  HGB 15.3 14.5  HCT 46.7* 44.3  MCV 87.5 85.9  PLT 345 377   Lipid Panel: Recent Labs    10/19/18 1203  CHOL 175  HDL 76  LDLCALC 85  TRIG 60  CHOLHDL 2.3   TSH: Recent Labs    03/21/19 1600  TSH 1.24   A1C: Lab Results  Component  Value Date   HGBA1C 5.4 10/19/2018     Assessment/Plan  1. Long term current use of anticoagulant - POC INR 2.3. goal 2.5-3.5 however she has had recent fall with large bruise, with her hx of ETOH abuse and falls would like her closer to 2.5-3.0 range. Will continue current dose and recheck in 2 weeks.  2. Age related osteoporosis, unspecified pathological fracture presence - alendronate (FOSAMAX) 70 MG tablet; Take 1 tablet (70 mg total) by mouth every Sunday. Take with a full glass of water on an empty stomach.  Dispense: 12 tablet; Refill: 3  3. H/O mitral valve replacement with mechanical valve -stable, asymptomatic, continues on coumadin.   4. Paroxysmal atrial fibrillation (HCC) -rate controlled without medication, continues on couamdin for anticoagulation.   5. ETOH abuse Reports she plans to stop drinking wine. - vitamin B-12 (CYANOCOBALAMIN) 1000 MCG tablet; Take 1 tablet (1,000 mcg total) by mouth daily.  Dispense: 90 tablet; Refill: 1 - folic acid (FOLVITE) 1 MG tablet; Take 1 tablet (1 mg total) by mouth daily.  Dispense: 90 tablet; Refill: 1  6. Vascular dementia without behavioral disturbance ([place]) -reviewed head CT with pt, memory loss likely due to several factors including what appears to be new infarct since 2019, mild to moderate gray-white matter differentiation and chronic ETOH abuse. Educated to pt that memory loss will progress over time and she should consider making future plans. She states she does not think she will move to where her daughter is because her daughter is planning to move. Encouraged to have help in the home with medication as she is getting  mixed up with taking medication at time. She has been agreeable to stop drinking wine.  - donepezil (ARICEPT) 5 MG tablet; Take 1 tablet (5 mg total) by mouth at bedtime.  Dispense: 30 tablet; Refill: 0  7. B12 deficiency -educated that she needs to continue on supplements. - vitamin B-12 (CYANOCOBALAMIN) 1000  MCG tablet; Take 1 tablet (1,000 mcg total) by mouth daily.  Dispense: 90 tablet; Refill: 1 - folic acid (FOLVITE) 1 MG tablet; Take 1 tablet (1 mg total) by mouth daily.  Dispense: 90 tablet; Refill: 1    Next appt: 2 weeks.  Carlos American. [place], [facility] [phone]

## 2019-05-17 ENCOUNTER — Encounter: Payer: Self-pay | Admitting: Nurse Practitioner

## 2019-05-25 ENCOUNTER — Encounter: Payer: Self-pay | Admitting: Nurse Practitioner

## 2019-05-25 ENCOUNTER — Ambulatory Visit (INDEPENDENT_AMBULATORY_CARE_PROVIDER_SITE_OTHER): Payer: Medicare Other | Admitting: Nurse Practitioner

## 2019-05-25 ENCOUNTER — Other Ambulatory Visit: Payer: Self-pay

## 2019-05-25 VITALS — BP 138/88 | HR 104 | Temp 96.9°F | Ht 63.0 in | Wt 145.0 lb

## 2019-05-25 DIAGNOSIS — Z952 Presence of prosthetic heart valve: Secondary | ICD-10-CM

## 2019-05-25 DIAGNOSIS — I480 Paroxysmal atrial fibrillation: Secondary | ICD-10-CM

## 2019-05-25 DIAGNOSIS — M810 Age-related osteoporosis without current pathological fracture: Secondary | ICD-10-CM

## 2019-05-25 DIAGNOSIS — Z7901 Long term (current) use of anticoagulants: Secondary | ICD-10-CM | POA: Diagnosis not present

## 2019-05-25 DIAGNOSIS — F0150 Vascular dementia without behavioral disturbance: Secondary | ICD-10-CM

## 2019-05-25 DIAGNOSIS — F1010 Alcohol abuse, uncomplicated: Secondary | ICD-10-CM

## 2019-05-25 LAB — POCT INR: INR: 2.6 (ref 2.0–3.0)

## 2019-05-25 NOTE — Progress Notes (Signed)
Careteam: Patient Care Team: [name], NP as PCP - General (Geriatric Medicine) Sueanne Margarita, MD as Consulting Physician (Cardiology)  Advanced Directive information    Allergies  Allergen Reactions  . Carvedilol Itching  . Ace Inhibitors Other (See Comments)    Reaction (??)  . Amlodipine Swelling  . Hydrochlorothiazide Other (See Comments)    Hyponatremia   . Metoprolol Other (See Comments)    Dizziness     Chief Complaint  Patient presents with  . Anticoagulation    PT/INR check, no misses doses, no bleeding, no diet changes (2.6)   . Medication Management    Out of fosamax x 2 weeks. Patient informed rx filled on 05/15/2019. I called [facility], spoke with April, patient picked up a 3 month supply on 04/22/2019 and not due for additional refill at this time.      HPI: Patient is a 78 y.o. female seen in the office today for follow up INR.   Pt taking coumadin 2 mg on Tuesday and Friday with 4 mg the rest of the week. States she has been taking medication as prescribed. No miss doses. Does not feel like she has had another fall (acutally admits to not remembering any falls but we have discussed that she has at at least 3 in the last several months due to bruising noted on her during OV) no changes in diet.  Reports she has stopped drinking wine, reports her daughter told her to stop drinking so she reports she has adhered. Plans to drink for new years.   States she is out of fosamax, does not remember getting prescription at pharmacy in November for 3 month   Review of Systems:  Review of Systems  Constitutional: Negative for chills and fever.  Respiratory: Negative for hemoptysis and shortness of breath.   Cardiovascular: Negative for chest pain, palpitations and leg swelling.  Genitourinary: Negative for hematuria.  Musculoskeletal: Negative for myalgias.  Neurological: Negative for dizziness and headaches.  Endo/Heme/Allergies: Does not  bruise/bleed easily.  Psychiatric/Behavioral: Positive for memory loss.    Past Medical History:  Diagnosis Date  . Broken hip ([place]) 10/2011   left  . Broken wrist   . Depression   . Diarrhea   . H/O long-term (current) use of anticoagulants   . High cholesterol   . Hypertension   . Memory difficulty 01/18/2016  . 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 Osteoporosis   . SIADH (syndrome of inappropriate ADH production) ([place])   . Urinary retention    chronic  . Vertigo 01/18/2016   Past Surgical History:  Procedure Laterality Date  . HEMATOMA EVACUATION Left 11/25/2012   Procedure: EVACUATION HEMATOMA;  Surgeon: Linna Hoff, MD;  Location: [facility];  Service: Orthopedics;  Laterality: Left;  . HIP PINNING,CANNULATED Left 11/03/2012   Procedure: CANNULATED HIP PINNING;  Surgeon: Tobi Bastos, MD;  Location: [facility];  Service: Orthopedics;  Laterality: Left;  . IR INJECT/THERA/INC NEEDLE/CATH/PLC EPI/LUMB/SAC W/IMG  07/20/2017  . 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  . OPEN REDUCTION INTERNAL FIXATION (ORIF) WRIST WITH ILIAC CREST BONE GRAFT Left 11/24/2012   Procedure: OPEN REDUCTION INTERNAL FIXATION LEFT WRIST POSSIBLE BONE GRAFTING AND PINNING (ORIF) ;  Surgeon: Linna Hoff, MD;  Location: [facility];  Service: Orthopedics;  Laterality: Left;  . SHOULDER OPEN ROTATOR CUFF REPAIR     Social History:   reports that she has never smoked.  She has never used smokeless tobacco. She reports current alcohol use of about 1.0 standard drinks of alcohol per week. She reports that she does not use drugs.  Family History  Problem Relation Age of Onset  . Stroke Mother   . Hypertension Mother   . Stroke Father   . Colon cancer Paternal Grandmother   . Parkinsonism Brother     Medications: Patient's Medications  New Prescriptions   No medications on file  Previous Medications   ALENDRONATE (FOSAMAX) 70 MG TABLET    Take 1 tablet (70 mg total) by mouth every Sunday.  Take with a full glass of water on an empty stomach.   ASPIRIN EC 81 MG TABLET    Take 1 tablet (81 mg total) by mouth daily.   ATORVASTATIN (LIPITOR) 10 MG TABLET    TAKE ONE TABLET BY MOUTH EVERY NIGHT AT BEDTIME   DONEPEZIL (ARICEPT) 5 MG TABLET    Take 1 tablet (5 mg total) by mouth at bedtime.   FOLIC ACID (FOLVITE) 1 MG TABLET    Take 1 tablet (1 mg total) by mouth daily.   HYDRALAZINE (APRESOLINE) 50 MG TABLET    TAKE ONE TABLET BY MOUTH THREE TIMES A DAY   MECLIZINE (ANTIVERT) 25 MG TABLET    Take 1 tablet (25 mg total) by mouth every 8 (eight) hours as needed for dizziness.   SPIRONOLACTONE (ALDACTONE) 25 MG TABLET    Take 0.5 tablets (12.5 mg total) by mouth daily.   VITAMIN B-12 (CYANOCOBALAMIN) 1000 MCG TABLET    Take 1 tablet (1,000 mcg total) by mouth daily.   WARFARIN (COUMADIN) 4 MG TABLET    4 mg daily except Tuesday and Friday take 2 mg  Modified Medications   No medications on file  Discontinued Medications   No medications on file    Physical Exam:  Vitals:   05/25/19 1133  BP: 138/88  Pulse: (!) 104  Temp: (!) 96.9 F (36.1 C)  TempSrc: Temporal  SpO2: 97%  Weight: 145 lb (65.8 kg)  Height: 5\' 3"  (1.6 m)   Body mass index is 25.69 kg/m. Wt Readings from Last 3 Encounters:  05/25/19 145 lb (65.8 kg)  05/13/19 149 lb (67.6 kg)  05/06/19 149 lb (67.6 kg)    Physical Exam Constitutional:      General: She is not in acute distress.    Appearance: She is well-developed. She is not diaphoretic.  HENT:     Head: Normocephalic and atraumatic.     Mouth/Throat:     Pharynx: No oropharyngeal exudate.  Eyes:     Conjunctiva/sclera: Conjunctivae normal.     Pupils: Pupils are equal, round, and reactive to light.  Cardiovascular:     Rate and Rhythm: Normal rate. Rhythm irregular.     Heart sounds: Normal heart sounds.  Pulmonary:     Effort: Pulmonary effort is normal.     Breath sounds: Normal breath sounds.  Abdominal:     General: Bowel sounds are  normal.     Palpations: Abdomen is soft.  Musculoskeletal:        General: No tenderness.     Cervical back: Normal range of motion and neck supple.  Skin:    General: Skin is warm and dry.     Findings: Bruising (improved bruising to left leg) present.  Neurological:     Mental Status: She is alert and oriented to person, place, and time.     Labs reviewed: Basic Metabolic Panel:  Recent Labs    07/20/18 1400 09/07/18 1208 03/21/19 1600  NA 134* 136 136  K 4.2 4.1 4.3  CL 100 102 101  CO2 23 26 22   GLUCOSE 108 116 102  BUN 18 17 15   CREATININE 0.83 0.73 0.80  CALCIUM 10.2 10.1 9.8  TSH  --   --  1.24   Liver Function Tests: Recent Labs    03/21/19 1600  AST 17  ALT 12  BILITOT 1.6*  PROT 7.0   No results for input(s): LIPASE, AMYLASE in the last 8760 hours. Recent Labs    03/21/19 1600  AMMONIA 34   CBC: Recent Labs    09/07/18 1208 03/21/19 1600  WBC 7.4 8.7  NEUTROABS 5,513 6,342  HGB 15.3 14.5  HCT 46.7* 44.3  MCV 87.5 85.9  PLT 345 377   Lipid Panel: Recent Labs    10/19/18 1203  CHOL 175  HDL 76  LDLCALC 85  TRIG 60  CHOLHDL 2.3   TSH: Recent Labs    03/21/19 1600  TSH 1.24   A1C: Lab Results  Component Value Date   HGBA1C 5.4 10/19/2018     Assessment/Plan 1. Long term current use of anticoagulant - POC INR 2.6. goal 2.5-3.5.  Goal more in the 2.5-3 range due to hx of falls with ETOH abuse.    2. Age related osteoporosis, unspecified pathological fracture presence -pt reports she does not have any fosamax, she denied picking it up in November or does not remember doing so. Reports she knows she is supposed to take medication weekly. Called daughter to update her of concern and encouraged pt to have someone help her with medication.  3. H/O mitral valve replacement with mechanical valve Continues on coumadin  4 mg daily except 2 mg on Tuesday and Friday. INR at goal today.   4. Paroxysmal atrial fibrillation  (HCC) Stable, in a fib today but asymptomatic. Continue coumadin for anticoagulation goal 2.5-3.5 due to Mitral valve replacement. Goal more in the 2.5-3 range due to hx of falls with ETOH abuse.   5. ETOH abuse -reported to CMA only drinking 1 drink a week and planning on drinking on NYE, encouraged not to have any ETOH due to hx of abuse with uncontrolled INR and confusion.   6. Vascular dementia without behavioral disturbance ([place]) -progressive decline, reports she is taking her aricept and tolerating well.    Next appt: 2 weeks for INR.  Carlos American. [place], [facility] [phone]

## 2019-05-25 NOTE — Patient Instructions (Signed)
Continue coumadin 2 mg on Tuesday and Friday and 4 mg on all other day  Follow up in 2 weeks for INR

## 2019-06-10 ENCOUNTER — Other Ambulatory Visit: Payer: Self-pay

## 2019-06-10 ENCOUNTER — Ambulatory Visit (INDEPENDENT_AMBULATORY_CARE_PROVIDER_SITE_OTHER): Payer: Medicare HMO | Admitting: Nurse Practitioner

## 2019-06-10 ENCOUNTER — Other Ambulatory Visit: Payer: Self-pay | Admitting: Nurse Practitioner

## 2019-06-10 ENCOUNTER — Encounter: Payer: Self-pay | Admitting: Nurse Practitioner

## 2019-06-10 VITALS — BP 132/78 | HR 93 | Temp 97.8°F | Ht 63.0 in | Wt 143.0 lb

## 2019-06-10 DIAGNOSIS — Z952 Presence of prosthetic heart valve: Secondary | ICD-10-CM | POA: Diagnosis not present

## 2019-06-10 DIAGNOSIS — F0150 Vascular dementia without behavioral disturbance: Secondary | ICD-10-CM

## 2019-06-10 DIAGNOSIS — I480 Paroxysmal atrial fibrillation: Secondary | ICD-10-CM

## 2019-06-10 DIAGNOSIS — Z7901 Long term (current) use of anticoagulants: Secondary | ICD-10-CM

## 2019-06-10 DIAGNOSIS — R69 Illness, unspecified: Secondary | ICD-10-CM | POA: Diagnosis not present

## 2019-06-10 LAB — POCT INR: INR: 2.4 (ref 2.0–3.0)

## 2019-06-10 NOTE — Progress Notes (Signed)
Careteam: Patient Care Team: [name], NP as PCP - General (Geriatric Medicine) Sueanne Margarita, MD as Consulting Physician (Cardiology)  Advanced Directive information    Allergies  Allergen Reactions  . Carvedilol Itching  . Ace Inhibitors Other (See Comments)    Reaction (??)  . Amlodipine Swelling  . Hydrochlorothiazide Other (See Comments)    Hyponatremia   . Metoprolol Other (See Comments)    Dizziness     Chief Complaint  Patient presents with  . Anticoagulation    2 week PT/INR check(2.4). Moderate fall risk      HPI: Patient is a 79 y.o. female for INR check. INR today 2.4.  Denies any missed doses of coumadin. No new medications. No chest pains, palpitations, shortness of breath or leg swelling.  No abnormal or unusual bruising or bleeding  Lab Results  Component Value Date   INR 2.4 06/10/2019   INR 2.6 05/25/2019   INR 2.3 05/13/2019   PROTIME 24.3 (A) 02/27/2017   Does not have anyone helping her with medications.  Reports she has 3 pill boxes.   No more falls that she remembers.   Reports she has quit drinking wine at this time.   Review of Systems:  Review of Systems  Constitutional: Positive for weight loss. Negative for chills and fever.  Respiratory: Negative for shortness of breath.   Cardiovascular: Negative for chest pain, palpitations and leg swelling.  Gastrointestinal: Negative for blood in stool.  Genitourinary: Negative for hematuria.  Endo/Heme/Allergies: Bruises/bleeds easily (no new bruising).  Psychiatric/Behavioral: Positive for memory loss. Negative for depression. The patient is not nervous/anxious.    Past Medical History:  Diagnosis Date  . Broken hip ([place]) 10/2011   left  . Broken wrist   . Depression   . Diarrhea   . H/O long-term (current) use of anticoagulants   . High cholesterol   . Hypertension   . Memory difficulty 01/18/2016  . 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 Osteoporosis   .  SIADH (syndrome of inappropriate ADH production) ([place])   . Urinary retention    chronic  . Vertigo 01/18/2016   Past Surgical History:  Procedure Laterality Date  . HEMATOMA EVACUATION Left 11/25/2012   Procedure: EVACUATION HEMATOMA;  Surgeon: Linna Hoff, MD;  Location: [facility];  Service: Orthopedics;  Laterality: Left;  . HIP PINNING,CANNULATED Left 11/03/2012   Procedure: CANNULATED HIP PINNING;  Surgeon: Tobi Bastos, MD;  Location: [facility];  Service: Orthopedics;  Laterality: Left;  . IR INJECT/THERA/INC NEEDLE/CATH/PLC EPI/LUMB/SAC W/IMG  07/20/2017  . 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  . OPEN REDUCTION INTERNAL FIXATION (ORIF) WRIST WITH ILIAC CREST BONE GRAFT Left 11/24/2012   Procedure: OPEN REDUCTION INTERNAL FIXATION LEFT WRIST POSSIBLE BONE GRAFTING AND PINNING (ORIF) ;  Surgeon: Linna Hoff, MD;  Location: [facility];  Service: Orthopedics;  Laterality: Left;  . SHOULDER OPEN ROTATOR CUFF REPAIR     Social History:   reports that she has never smoked. She has never used smokeless tobacco. She reports previous alcohol use. She reports that she does not use drugs.  Family History  Problem Relation Age of Onset  . Stroke Mother   . Hypertension Mother   . Stroke Father   . Colon cancer Paternal Grandmother   . Parkinsonism Brother     Medications: Patient's Medications  New Prescriptions   No medications on file  Previous Medications  ALENDRONATE (FOSAMAX) 70 MG TABLET    Take 1 tablet (70 mg total) by mouth every Sunday. Take with a full glass of water on an empty stomach.   ASPIRIN EC 81 MG TABLET    Take 1 tablet (81 mg total) by mouth daily.   ATORVASTATIN (LIPITOR) 10 MG TABLET    TAKE ONE TABLET BY MOUTH EVERY NIGHT AT BEDTIME   DONEPEZIL (ARICEPT) 5 MG TABLET    Take 1 tablet (5 mg total) by mouth at bedtime.   FOLIC ACID (FOLVITE) 1 MG TABLET    Take 1 tablet (1 mg total) by mouth daily.   HYDRALAZINE (APRESOLINE) 50 MG TABLET    TAKE ONE TABLET  BY MOUTH THREE TIMES A DAY   MECLIZINE (ANTIVERT) 25 MG TABLET    Take 1 tablet (25 mg total) by mouth every 8 (eight) hours as needed for dizziness.   SPIRONOLACTONE (ALDACTONE) 25 MG TABLET    Take 0.5 tablets (12.5 mg total) by mouth daily.   VITAMIN B-12 (CYANOCOBALAMIN) 1000 MCG TABLET    Take 1 tablet (1,000 mcg total) by mouth daily.   WARFARIN (COUMADIN) 4 MG TABLET    4 mg daily except Tuesday and Friday take 2 mg  Modified Medications   No medications on file  Discontinued Medications   No medications on file    Physical Exam:  Vitals:   06/10/19 1103  BP: 132/78  Pulse: 93  Temp: 97.8 F (36.6 C)  TempSrc: Temporal  SpO2: 98%  Weight: 143 lb (64.9 kg)  Height: 5\' 3"  (1.6 m)   Body mass index is 25.33 kg/m. Wt Readings from Last 3 Encounters:  06/10/19 143 lb (64.9 kg)  05/25/19 145 lb (65.8 kg)  05/13/19 149 lb (67.6 kg)    Physical Exam Constitutional:      Appearance: Normal appearance.  Cardiovascular:     Rate and Rhythm: Normal rate. Rhythm irregular.  Pulmonary:     Effort: Pulmonary effort is normal.     Breath sounds: Normal breath sounds.  Musculoskeletal:     Right lower leg: No edema.     Left lower leg: No edema.  Skin:    General: Skin is warm and dry.     Findings: Bruising (to left hip and leg improving, without new bruising noted) present.  Neurological:     General: No focal deficit present.     Mental Status: She is alert. Mental status is at baseline.  Psychiatric:        Mood and Affect: Mood normal.        Behavior: Behavior normal.     Labs reviewed: Basic Metabolic Panel: Recent Labs    07/20/18 1400 09/07/18 1208 03/21/19 1600  NA 134* 136 136  K 4.2 4.1 4.3  CL 100 102 101  CO2 23 26 22   GLUCOSE 108 116 102  BUN 18 17 15   CREATININE 0.83 0.73 0.80  CALCIUM 10.2 10.1 9.8  TSH  --   --  1.24   Liver Function Tests: Recent Labs    03/21/19 1600  AST 17  ALT 12  BILITOT 1.6*  PROT 7.0   No results for  input(s): LIPASE, AMYLASE in the last 8760 hours. Recent Labs    03/21/19 1600  AMMONIA 34   CBC: Recent Labs    09/07/18 1208 03/21/19 1600  WBC 7.4 8.7  NEUTROABS 5,513 6,342  HGB 15.3 14.5  HCT 46.7* 44.3  MCV 87.5 85.9  PLT 345 377  Lipid Panel: Recent Labs    10/19/18 1203  CHOL 175  HDL 76  LDLCALC 85  TRIG 60  CHOLHDL 2.3   TSH: Recent Labs    03/21/19 1600  TSH 1.24   A1C: Lab Results  Component Value Date   HGBA1C 5.4 10/19/2018     Assessment/Plan 1. Long term current use of anticoagulant Due to hx of MVR and a fib, goal 2.5-3.5 however due to hx of falls with ETOH abuse would like INR to be in the 2.5-3 range ideally - POC INR- 2.4- will not adjust coumadin as she has been on this dose and will follow up in 3 weeks.   2. H/O mitral valve replacement with mechanical valve -continues on coumadin for anticoagulation.  3. Paroxysmal atrial fibrillation (HCC) -a fib at this time. Rate controlled and not on medication for this at this time. Continues on coumadin for anticoagulation.   4. 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 She has also lost weight and reports decrease in appetite. Encouraged 3 meals days but suspect she is forgetting to eat as well. Can add protein supplement to help with this but still needs to consume 3 meals a day. She really needs increase supervision.   Next appt: 3 weeks. Carlos American. [place], [facility] [phone]

## 2019-06-10 NOTE — Patient Instructions (Signed)
Continue coumadin 4 mg daily except 2 mg on Tuesday and Friday  Encourage you to get life alert and have someone to help you with medications daily- AND help you fill you medication boxes   Follow up INR in 3 weeks

## 2019-06-11 ENCOUNTER — Other Ambulatory Visit: Payer: Self-pay | Admitting: Nurse Practitioner

## 2019-06-11 DIAGNOSIS — F0150 Vascular dementia without behavioral disturbance: Secondary | ICD-10-CM

## 2019-06-11 DIAGNOSIS — I10 Essential (primary) hypertension: Secondary | ICD-10-CM

## 2019-06-18 ENCOUNTER — Encounter: Payer: Self-pay | Admitting: Nurse Practitioner

## 2019-06-28 ENCOUNTER — Encounter: Payer: Self-pay | Admitting: Nurse Practitioner

## 2019-07-01 ENCOUNTER — Ambulatory Visit (INDEPENDENT_AMBULATORY_CARE_PROVIDER_SITE_OTHER): Payer: Medicare HMO | Admitting: Nurse Practitioner

## 2019-07-01 ENCOUNTER — Other Ambulatory Visit: Payer: Self-pay

## 2019-07-01 ENCOUNTER — Encounter: Payer: Self-pay | Admitting: Nurse Practitioner

## 2019-07-01 VITALS — BP 126/80 | HR 81 | Temp 97.5°F | Ht 63.0 in | Wt 144.0 lb

## 2019-07-01 DIAGNOSIS — I480 Paroxysmal atrial fibrillation: Secondary | ICD-10-CM | POA: Diagnosis not present

## 2019-07-01 DIAGNOSIS — D6859 Other primary thrombophilia: Secondary | ICD-10-CM

## 2019-07-01 DIAGNOSIS — F0150 Vascular dementia without behavioral disturbance: Secondary | ICD-10-CM

## 2019-07-01 DIAGNOSIS — R69 Illness, unspecified: Secondary | ICD-10-CM | POA: Diagnosis not present

## 2019-07-01 DIAGNOSIS — Z952 Presence of prosthetic heart valve: Secondary | ICD-10-CM | POA: Diagnosis not present

## 2019-07-01 DIAGNOSIS — Z7901 Long term (current) use of anticoagulants: Secondary | ICD-10-CM | POA: Diagnosis not present

## 2019-07-01 DIAGNOSIS — I700 Atherosclerosis of aorta: Secondary | ICD-10-CM

## 2019-07-01 LAB — POCT INR: INR: 2.3 (ref 2.0–3.0)

## 2019-07-01 MED ORDER — WARFARIN SODIUM 4 MG PO TABS: ORAL_TABLET | ORAL | 0 refills | Status: AC

## 2019-07-01 NOTE — Patient Instructions (Addendum)
CHANGE COUMADIN--- 4 MG DAILY EXCEPT 2 MG ON Tuesday ONLY

## 2019-07-01 NOTE — Progress Notes (Signed)
Careteam: Patient Care Team: [name], NP as PCP - General (Geriatric Medicine) Sueanne Margarita, MD as Consulting Physician (Cardiology)  Advanced Directive information    Allergies  Allergen Reactions  . Carvedilol Itching  . Ace Inhibitors Other (See Comments)    Reaction (??)  . Amlodipine Swelling  . Hydrochlorothiazide Other (See Comments)    Hyponatremia   . Metoprolol Other (See Comments)    Dizziness     Chief Complaint  Patient presents with  . Anticoagulation    PT/INR check      HPI: Patient is a 79 y.o. female for INR check.  Pt with hx of memory loss and reports now she has someone helping her with her medication boxes Pt taking 4 mg daily except Tuesday and Friday which she takes 2 mg. She states she has been very compliant with medication and has NOT missed any doses.  No abnormal bruising or bleeding  No chest pains, palpitations, shortness of breath or swelling   Lab Results  Component Value Date   INR 2.3 07/01/2019   INR 2.4 06/10/2019   INR 2.6 05/25/2019   PROTIME 24.3 (A) 02/27/2017   Reports she has completely stopped drinking wine.     Review of Systems:  Review of Systems  Constitutional: Negative for chills, fever and weight loss.  HENT: Negative for tinnitus.   Respiratory: Negative for cough, sputum production and shortness of breath.   Cardiovascular: Negative for chest pain, palpitations and leg swelling.  Gastrointestinal: Negative for abdominal pain, constipation, diarrhea and heartburn.  Genitourinary: Negative for dysuria, frequency and urgency.  Musculoskeletal: Negative for back pain, falls, joint pain and myalgias.  Skin: Negative.   Neurological: Negative for dizziness and headaches.  Psychiatric/Behavioral: Positive for memory loss. Negative for depression. The patient does not have insomnia.     Past Medical History:  Diagnosis Date  . Broken hip ([place]) 10/2011   left  . Broken wrist   . Depression    . Diarrhea   . H/O long-term (current) use of anticoagulants   . High cholesterol   . Hypertension   . Memory difficulty 01/18/2016  . 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 Osteoporosis   . SIADH (syndrome of inappropriate ADH production) ([place])   . Urinary retention    chronic  . Vertigo 01/18/2016   Past Surgical History:  Procedure Laterality Date  . HEMATOMA EVACUATION Left 11/25/2012   Procedure: EVACUATION HEMATOMA;  Surgeon: Linna Hoff, MD;  Location: [facility];  Service: Orthopedics;  Laterality: Left;  . HIP PINNING,CANNULATED Left 11/03/2012   Procedure: CANNULATED HIP PINNING;  Surgeon: Tobi Bastos, MD;  Location: [facility];  Service: Orthopedics;  Laterality: Left;  . IR INJECT/THERA/INC NEEDLE/CATH/PLC EPI/LUMB/SAC W/IMG  07/20/2017  . 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  . OPEN REDUCTION INTERNAL FIXATION (ORIF) WRIST WITH ILIAC CREST BONE GRAFT Left 11/24/2012   Procedure: OPEN REDUCTION INTERNAL FIXATION LEFT WRIST POSSIBLE BONE GRAFTING AND PINNING (ORIF) ;  Surgeon: Linna Hoff, MD;  Location: [facility];  Service: Orthopedics;  Laterality: Left;  . SHOULDER OPEN ROTATOR CUFF REPAIR     Social History:   reports that she has never smoked. She has never used smokeless tobacco. She reports previous alcohol use. She reports that she does not use drugs.  Family History  Problem Relation Age of Onset  . Stroke Mother   . Hypertension Mother   .  Stroke Father   . Colon cancer Paternal Grandmother   . Parkinsonism Brother     Medications: Patient's Medications  New Prescriptions   No medications on file  Previous Medications   ALENDRONATE (FOSAMAX) 70 MG TABLET    Take 1 tablet (70 mg total) by mouth every Sunday. Take with a full glass of water on an empty stomach.   ASPIRIN EC 81 MG TABLET    Take 1 tablet (81 mg total) by mouth daily.   ATORVASTATIN (LIPITOR) 10 MG TABLET    TAKE ONE TABLET BY MOUTH EVERY NIGHT AT BEDTIME   DONEPEZIL (ARICEPT)  5 MG TABLET    TAKE ONE TABLET BY MOUTH EVERY NIGHT AT BEDTIME   FOLIC ACID (FOLVITE) 1 MG TABLET    Take 1 tablet (1 mg total) by mouth daily.   HYDRALAZINE (APRESOLINE) 50 MG TABLET    TAKE ONE TABLET BY MOUTH THREE TIMES A DAY   MECLIZINE (ANTIVERT) 25 MG TABLET    Take 1 tablet (25 mg total) by mouth every 8 (eight) hours as needed for dizziness.   SPIRONOLACTONE (ALDACTONE) 25 MG TABLET    TAKE ONE-HALF TABLET BY MOUTH DAILY   VITAMIN B-12 (CYANOCOBALAMIN) 1000 MCG TABLET    Take 1 tablet (1,000 mcg total) by mouth daily.   WARFARIN (COUMADIN) 4 MG TABLET    4 mg daily except Tuesday and Friday take 2 mg  Modified Medications   No medications on file  Discontinued Medications   No medications on file    Physical Exam:  Vitals:   07/01/19 1306  BP: 126/80  Pulse: 81  Temp: (!) 97.5 F (36.4 C)  TempSrc: Temporal  SpO2: 98%  Weight: 144 lb (65.3 kg)  Height: 5\' 3"  (1.6 m)   Body mass index is 25.51 kg/m. Wt Readings from Last 3 Encounters:  07/01/19 144 lb (65.3 kg)  06/10/19 143 lb (64.9 kg)  05/25/19 145 lb (65.8 kg)    Physical Exam Constitutional:      Appearance: Normal appearance.  Cardiovascular:     Rate and Rhythm: Normal rate. Rhythm irregular.  Pulmonary:     Effort: Pulmonary effort is normal.     Breath sounds: Normal breath sounds.  Musculoskeletal:     Right lower leg: No edema.     Left lower leg: No edema.  Skin:    General: Skin is warm and dry.  Neurological:     General: No focal deficit present.     Mental Status: She is alert. Mental status is at baseline.  Psychiatric:        Mood and Affect: Mood normal.        Behavior: Behavior normal.     Labs reviewed: Basic Metabolic Panel: Recent Labs    07/20/18 1400 09/07/18 1208 03/21/19 1600  NA 134* 136 136  K 4.2 4.1 4.3  CL 100 102 101  CO2 23 26 22   GLUCOSE 108 116 102  BUN 18 17 15   CREATININE 0.83 0.73 0.80  CALCIUM 10.2 10.1 9.8  TSH  --   --  1.24   Liver Function  Tests: Recent Labs    03/21/19 1600  AST 17  ALT 12  BILITOT 1.6*  PROT 7.0   No results for input(s): LIPASE, AMYLASE in the last 8760 hours. Recent Labs    03/21/19 1600  AMMONIA 34   CBC: Recent Labs    09/07/18 1208 03/21/19 1600  WBC 7.4 8.7  NEUTROABS 5,513 6,342  HGB 15.3 14.5  HCT 46.7* 44.3  MCV 87.5 85.9  PLT 345 377   Lipid Panel: Recent Labs    10/19/18 1203  CHOL 175  HDL 76  LDLCALC 85  TRIG 60  CHOLHDL 2.3   TSH: Recent Labs    03/21/19 1600  TSH 1.24   A1C: Lab Results  Component Value Date   HGBA1C 5.4 10/19/2018     Assessment/Plan 1. Hypercoagulable state ([place]) Due to afib and hx of mitral valve replacement, on coumadin   2. Long term current use of anticoagulant - POC INR 2.3 GOAL 2.5-3.5, and has been trending down, will increase coumadin to 4 mg daily with 2 mg on Tuesday.  - warfarin (COUMADIN) 4 MG tablet; 4 mg daily by mouth except Tuesday take 2 mg  Dispense: 33 tablet; Refill: 0  3. H/O mitral valve replacement with mechanical valve -stable, continues on coumadin for anticoagulation.  - warfarin (COUMADIN) 4 MG tablet; 4 mg daily by mouth except Tuesday take 2 mg  Dispense: 33 tablet; Refill: 0  5. Paroxysmal atrial fibrillation (HCC) -rate control, not currently on mediation for rate control.   - warfarin (COUMADIN) 4 MG tablet; 4 mg daily by mouth except Tuesday take 2 mg  Dispense: 33 tablet; Refill: 0 - CBC with Differential/Platelet - COMPLETE METABOLIC PANEL WITH GFR  6. Abdominal aortic atherosclerosis ([place]) -noted, continues on ASA with lipid lowing medication.2  7. Vascular dementia without behavioral disturbance (HCC) Stable. Now with help with her medication management and using a pill box.  Next appt: 2 weeks.  Carlos American. [place], [facility] [phone]

## 2019-07-02 LAB — COMPLETE METABOLIC PANEL WITH GFR
AG Ratio: 1.9 (calc) (ref 1.0–2.5)
ALT: 11 U/L (ref 6–29)
AST: 12 U/L (ref 10–35)
Albumin: 4.5 g/dL (ref 3.6–5.1)
Alkaline phosphatase (APISO): 62 U/L (ref 37–153)
BUN: 17 mg/dL (ref 7–25)
CO2: 26 mmol/L (ref 20–32)
Calcium: 9.9 mg/dL (ref 8.6–10.4)
Chloride: 101 mmol/L (ref 98–110)
Creat: 0.92 mg/dL (ref 0.60–0.93)
GFR, Est African American: 70 mL/min/{1.73_m2} (ref >=60)
GFR, Est Non African American: 60 mL/min/{1.73_m2} (ref >=60)
Globulin: 2.4 g/dL (calc) (ref 1.9–3.7)
Glucose, Bld: 111 mg/dL (ref 65–139)
Potassium: 4.2 mmol/L (ref 3.5–5.3)
Sodium: 135 mmol/L (ref 135–146)
Total Bilirubin: 1.1 mg/dL (ref 0.2–1.2)
Total Protein: 6.9 g/dL (ref 6.1–8.1)

## 2019-07-02 LAB — CBC WITH DIFFERENTIAL/PLATELET
Absolute Monocytes: 730 cells/uL (ref 200–950)
Basophils Absolute: 74 cells/uL (ref 0–200)
Basophils Relative: 0.9 %
Eosinophils Absolute: 213 cells/uL (ref 15–500)
Eosinophils Relative: 2.6 %
HCT: 44.1 % (ref 35.0–45.0)
Hemoglobin: 14.3 g/dL (ref 11.7–15.5)
Lymphs Abs: 1041 cells/uL (ref 850–3900)
MCH: 27.8 pg (ref 27.0–33.0)
MCHC: 32.4 g/dL (ref 32.0–36.0)
MCV: 85.6 fL (ref 80.0–100.0)
MPV: 10.2 fL (ref 7.5–12.5)
Monocytes Relative: 8.9 %
Neutro Abs: 6142 cells/uL (ref 1500–7800)
Neutrophils Relative %: 74.9 %
Platelets: 347 10*3/uL (ref 140–400)
RBC: 5.15 10*6/uL — ABNORMAL HIGH (ref 3.80–5.10)
RDW: 12.7 % (ref 11.0–15.0)
Total Lymphocyte: 12.7 %
WBC: 8.2 10*3/uL (ref 3.8–10.8)

## 2019-07-07 ENCOUNTER — Other Ambulatory Visit: Payer: Self-pay | Admitting: Nurse Practitioner

## 2019-07-07 DIAGNOSIS — I480 Paroxysmal atrial fibrillation: Secondary | ICD-10-CM

## 2019-07-07 DIAGNOSIS — Z952 Presence of prosthetic heart valve: Secondary | ICD-10-CM

## 2019-07-09 ENCOUNTER — Other Ambulatory Visit: Payer: Self-pay

## 2019-07-09 ENCOUNTER — Emergency Department (HOSPITAL_COMMUNITY): Payer: Medicare HMO

## 2019-07-09 ENCOUNTER — Inpatient Hospital Stay (HOSPITAL_COMMUNITY)
Admission: EM | Admit: 2019-07-09 | Discharge: 2019-07-13 | DRG: 964 | Disposition: A | Discharge status: Skilled Nursing Facility | Payer: Medicare HMO | Attending: Family Medicine | Admitting: Family Medicine

## 2019-07-09 DIAGNOSIS — R52 Pain, unspecified: Secondary | ICD-10-CM | POA: Diagnosis not present

## 2019-07-09 DIAGNOSIS — Z8781 Personal history of (healed) traumatic fracture: Secondary | ICD-10-CM | POA: Diagnosis not present

## 2019-07-09 DIAGNOSIS — E7849 Other hyperlipidemia: Secondary | ICD-10-CM | POA: Diagnosis not present

## 2019-07-09 DIAGNOSIS — E569 Vitamin deficiency, unspecified: Secondary | ICD-10-CM | POA: Diagnosis not present

## 2019-07-09 DIAGNOSIS — S329XXA Fracture of unspecified parts of lumbosacral spine and pelvis, initial encounter for closed fracture: Secondary | ICD-10-CM

## 2019-07-09 DIAGNOSIS — Z952 Presence of prosthetic heart valve: Secondary | ICD-10-CM | POA: Diagnosis not present

## 2019-07-09 DIAGNOSIS — R69 Illness, unspecified: Secondary | ICD-10-CM | POA: Diagnosis not present

## 2019-07-09 DIAGNOSIS — M810 Age-related osteoporosis without current pathological fracture: Secondary | ICD-10-CM | POA: Diagnosis present

## 2019-07-09 DIAGNOSIS — R002 Palpitations: Secondary | ICD-10-CM | POA: Diagnosis not present

## 2019-07-09 DIAGNOSIS — S32810A Multiple fractures of pelvis with stable disruption of pelvic ring, initial encounter for closed fracture: Secondary | ICD-10-CM | POA: Diagnosis not present

## 2019-07-09 DIAGNOSIS — E782 Mixed hyperlipidemia: Secondary | ICD-10-CM | POA: Diagnosis present

## 2019-07-09 DIAGNOSIS — R7989 Other specified abnormal findings of blood chemistry: Secondary | ICD-10-CM | POA: Diagnosis not present

## 2019-07-09 DIAGNOSIS — M6282 Rhabdomyolysis: Secondary | ICD-10-CM | POA: Diagnosis present

## 2019-07-09 DIAGNOSIS — F0150 Vascular dementia without behavioral disturbance: Secondary | ICD-10-CM | POA: Diagnosis present

## 2019-07-09 DIAGNOSIS — E222 Syndrome of inappropriate secretion of antidiuretic hormone: Secondary | ICD-10-CM | POA: Diagnosis not present

## 2019-07-09 DIAGNOSIS — Z7983 Long term (current) use of bisphosphonates: Secondary | ICD-10-CM | POA: Diagnosis not present

## 2019-07-09 DIAGNOSIS — I447 Left bundle-branch block, unspecified: Secondary | ICD-10-CM | POA: Diagnosis present

## 2019-07-09 DIAGNOSIS — Z7982 Long term (current) use of aspirin: Secondary | ICD-10-CM

## 2019-07-09 DIAGNOSIS — S32512A Fracture of superior rim of left pubis, initial encounter for closed fracture: Secondary | ICD-10-CM | POA: Diagnosis not present

## 2019-07-09 DIAGNOSIS — Z66 Do not resuscitate: Secondary | ICD-10-CM | POA: Diagnosis present

## 2019-07-09 DIAGNOSIS — W06XXXA Fall from bed, initial encounter: Secondary | ICD-10-CM | POA: Diagnosis present

## 2019-07-09 DIAGNOSIS — R945 Abnormal results of liver function studies: Secondary | ICD-10-CM | POA: Diagnosis not present

## 2019-07-09 DIAGNOSIS — Z20822 Contact with and (suspected) exposure to covid-19: Secondary | ICD-10-CM | POA: Diagnosis not present

## 2019-07-09 DIAGNOSIS — R791 Abnormal coagulation profile: Secondary | ICD-10-CM | POA: Diagnosis present

## 2019-07-09 DIAGNOSIS — T796XXA Traumatic ischemia of muscle, initial encounter: Secondary | ICD-10-CM | POA: Diagnosis not present

## 2019-07-09 DIAGNOSIS — R609 Edema, unspecified: Secondary | ICD-10-CM | POA: Diagnosis not present

## 2019-07-09 DIAGNOSIS — I10 Essential (primary) hypertension: Secondary | ICD-10-CM | POA: Diagnosis present

## 2019-07-09 DIAGNOSIS — Z820 Family history of epilepsy and other diseases of the nervous system: Secondary | ICD-10-CM

## 2019-07-09 DIAGNOSIS — I119 Hypertensive heart disease without heart failure: Secondary | ICD-10-CM | POA: Diagnosis not present

## 2019-07-09 DIAGNOSIS — R519 Headache, unspecified: Secondary | ICD-10-CM | POA: Diagnosis not present

## 2019-07-09 DIAGNOSIS — Z8249 Family history of ischemic heart disease and other diseases of the circulatory system: Secondary | ICD-10-CM

## 2019-07-09 DIAGNOSIS — M2550 Pain in unspecified joint: Secondary | ICD-10-CM | POA: Diagnosis not present

## 2019-07-09 DIAGNOSIS — Z954 Presence of other heart-valve replacement: Secondary | ICD-10-CM | POA: Diagnosis not present

## 2019-07-09 DIAGNOSIS — Z888 Allergy status to other drugs, medicaments and biological substances status: Secondary | ICD-10-CM

## 2019-07-09 DIAGNOSIS — M6281 Muscle weakness (generalized): Secondary | ICD-10-CM | POA: Diagnosis not present

## 2019-07-09 DIAGNOSIS — Y92003 Bedroom of unspecified non-institutional (private) residence as the place of occurrence of the external cause: Secondary | ICD-10-CM | POA: Diagnosis not present

## 2019-07-09 DIAGNOSIS — W19XXXA Unspecified fall, initial encounter: Secondary | ICD-10-CM

## 2019-07-09 DIAGNOSIS — R58 Hemorrhage, not elsewhere classified: Secondary | ICD-10-CM | POA: Diagnosis not present

## 2019-07-09 DIAGNOSIS — S32599A Other specified fracture of unspecified pubis, initial encounter for closed fracture: Secondary | ICD-10-CM | POA: Insufficient documentation

## 2019-07-09 DIAGNOSIS — Z7901 Long term (current) use of anticoagulants: Secondary | ICD-10-CM | POA: Diagnosis not present

## 2019-07-09 DIAGNOSIS — S3993XA Unspecified injury of pelvis, initial encounter: Secondary | ICD-10-CM | POA: Diagnosis not present

## 2019-07-09 DIAGNOSIS — I4891 Unspecified atrial fibrillation: Secondary | ICD-10-CM

## 2019-07-09 DIAGNOSIS — M169 Osteoarthritis of hip, unspecified: Secondary | ICD-10-CM | POA: Diagnosis not present

## 2019-07-09 DIAGNOSIS — R2681 Unsteadiness on feet: Secondary | ICD-10-CM | POA: Diagnosis not present

## 2019-07-09 DIAGNOSIS — M48061 Spinal stenosis, lumbar region without neurogenic claudication: Secondary | ICD-10-CM | POA: Diagnosis present

## 2019-07-09 DIAGNOSIS — E860 Dehydration: Secondary | ICD-10-CM | POA: Diagnosis present

## 2019-07-09 DIAGNOSIS — Z800 Family history of malignant neoplasm of digestive organs: Secondary | ICD-10-CM | POA: Diagnosis not present

## 2019-07-09 DIAGNOSIS — Z823 Family history of stroke: Secondary | ICD-10-CM | POA: Diagnosis not present

## 2019-07-09 DIAGNOSIS — S32509A Unspecified fracture of unspecified pubis, initial encounter for closed fracture: Secondary | ICD-10-CM | POA: Diagnosis not present

## 2019-07-09 DIAGNOSIS — R000 Tachycardia, unspecified: Secondary | ICD-10-CM | POA: Diagnosis not present

## 2019-07-09 DIAGNOSIS — R4182 Altered mental status, unspecified: Secondary | ICD-10-CM | POA: Diagnosis not present

## 2019-07-09 DIAGNOSIS — S32119A Unspecified Zone I fracture of sacrum, initial encounter for closed fracture: Principal | ICD-10-CM | POA: Diagnosis present

## 2019-07-09 DIAGNOSIS — Z7401 Bed confinement status: Secondary | ICD-10-CM | POA: Diagnosis not present

## 2019-07-09 DIAGNOSIS — I480 Paroxysmal atrial fibrillation: Secondary | ICD-10-CM | POA: Diagnosis present

## 2019-07-09 DIAGNOSIS — M79605 Pain in left leg: Secondary | ICD-10-CM | POA: Diagnosis not present

## 2019-07-09 DIAGNOSIS — R0902 Hypoxemia: Secondary | ICD-10-CM | POA: Diagnosis not present

## 2019-07-09 DIAGNOSIS — S329XXD Fracture of unspecified parts of lumbosacral spine and pelvis, subsequent encounter for fracture with routine healing: Secondary | ICD-10-CM | POA: Diagnosis not present

## 2019-07-09 DIAGNOSIS — Z79899 Other long term (current) drug therapy: Secondary | ICD-10-CM

## 2019-07-09 DIAGNOSIS — R55 Syncope and collapse: Secondary | ICD-10-CM | POA: Diagnosis not present

## 2019-07-09 LAB — CBC WITH DIFFERENTIAL/PLATELET
Abs Immature Granulocytes: 0.11 10*3/uL — ABNORMAL HIGH (ref 0.00–0.07)
Basophils Absolute: 0 10*3/uL (ref 0.0–0.1)
Basophils Relative: 0 %
Eosinophils Absolute: 0 10*3/uL (ref 0.0–0.5)
Eosinophils Relative: 0 %
HCT: 44.1 % (ref 36.0–46.0)
Hemoglobin: 14.4 g/dL (ref 12.0–15.0)
Immature Granulocytes: 1 %
Lymphocytes Relative: 3 %
Lymphs Abs: 0.6 10*3/uL — ABNORMAL LOW (ref 0.7–4.0)
MCH: 27.7 pg (ref 26.0–34.0)
MCHC: 32.7 g/dL (ref 30.0–36.0)
MCV: 84.8 fL (ref 80.0–100.0)
Monocytes Absolute: 0.9 10*3/uL (ref 0.1–1.0)
Monocytes Relative: 4 %
Neutro Abs: 19 10*3/uL — ABNORMAL HIGH (ref 1.7–7.7)
Neutrophils Relative %: 92 %
Platelets: 263 10*3/uL (ref 150–400)
RBC: 5.2 MIL/uL — ABNORMAL HIGH (ref 3.87–5.11)
RDW: 14 % (ref 11.5–15.5)
WBC: 20.7 10*3/uL — ABNORMAL HIGH (ref 4.0–10.5)
nRBC: 0 % (ref 0.0–0.2)

## 2019-07-09 LAB — COMPREHENSIVE METABOLIC PANEL
ALT: 28 U/L (ref 0–44)
AST: 46 U/L — ABNORMAL HIGH (ref 15–41)
Albumin: 4.1 g/dL (ref 3.5–5.0)
Alkaline Phosphatase: 56 U/L (ref 38–126)
Anion gap: 14 (ref 5–15)
BUN: 15 mg/dL (ref 8–23)
CO2: 21 mmol/L — ABNORMAL LOW (ref 22–32)
Calcium: 9.1 mg/dL (ref 8.9–10.3)
Chloride: 98 mmol/L (ref 98–111)
Creatinine, Ser: 0.92 mg/dL (ref 0.44–1.00)
GFR calc Af Amer: >60 mL/min (ref >60)
GFR calc non Af Amer: >60 mL/min (ref >60)
Glucose, Bld: 112 mg/dL — ABNORMAL HIGH (ref 70–99)
Potassium: 4.6 mmol/L (ref 3.5–5.1)
Sodium: 133 mmol/L — ABNORMAL LOW (ref 135–145)
Total Bilirubin: 3.1 mg/dL — ABNORMAL HIGH (ref 0.3–1.2)
Total Protein: 6.9 g/dL (ref 6.5–8.1)

## 2019-07-09 LAB — RESPIRATORY PANEL BY RT PCR (FLU A&B, COVID)
Influenza A by PCR: NEGATIVE
Influenza B by PCR: NEGATIVE
SARS Coronavirus 2 by RT PCR: NEGATIVE

## 2019-07-09 LAB — CK: Total CK: 835 U/L — ABNORMAL HIGH (ref 38–234)

## 2019-07-09 LAB — TROPONIN I (HIGH SENSITIVITY)
Troponin I (High Sensitivity): 53 ng/L — ABNORMAL HIGH (ref <18)
Troponin I (High Sensitivity): 77 ng/L — ABNORMAL HIGH (ref <18)

## 2019-07-09 LAB — LACTIC ACID, PLASMA
Lactic Acid, Venous: 1.5 mmol/L (ref 0.5–1.9)
Lactic Acid, Venous: 1.4 mmol/L (ref 0.5–1.9)

## 2019-07-09 LAB — URINALYSIS, ROUTINE W REFLEX MICROSCOPIC
Bacteria, UA: NONE SEEN
Bilirubin Urine: NEGATIVE
Glucose, UA: NEGATIVE mg/dL
Ketones, ur: 80 mg/dL — AB
Nitrite: NEGATIVE
Protein, ur: 100 mg/dL — AB
Specific Gravity, Urine: 1.024 (ref 1.005–1.030)
WBC, UA: >50 WBC/hpf — ABNORMAL HIGH (ref 0–5)
pH: 5 (ref 5.0–8.0)

## 2019-07-09 LAB — PROTIME-INR
INR: 5.1 (ref 0.8–1.2)
Prothrombin Time: 47.3 seconds — ABNORMAL HIGH (ref 11.4–15.2)

## 2019-07-09 MED ORDER — SODIUM CHLORIDE 0.9 % IV SOLN: INTRAVENOUS | Status: AC

## 2019-07-09 MED ORDER — METOPROLOL TARTRATE 5 MG/5ML IV SOLN: 5.0000 mg | INTRAVENOUS | Status: DC | PRN

## 2019-07-09 MED ORDER — ALENDRONATE SODIUM 70 MG PO TABS
70.0000 mg | ORAL_TABLET | ORAL | Status: DC
  Administered 2019-07-10: 70 mg via ORAL
  Filled 2019-07-09: qty 1

## 2019-07-09 MED ORDER — FOLIC ACID 1 MG PO TABS
1.0000 mg | ORAL_TABLET | Freq: Every day | ORAL | Status: DC
  Administered 2019-07-09 – 2019-07-13 (×5): 1 mg via ORAL
  Filled 2019-07-09 (×5): qty 1

## 2019-07-09 MED ORDER — HYDRALAZINE HCL 50 MG PO TABS
50.0000 mg | ORAL_TABLET | Freq: Three times a day (TID) | ORAL | Status: DC
  Administered 2019-07-09 – 2019-07-13 (×11): 50 mg via ORAL
  Filled 2019-07-09 (×11): qty 1

## 2019-07-09 MED ORDER — ACETAMINOPHEN 325 MG PO TABS
650.0000 mg | ORAL_TABLET | Freq: Four times a day (QID) | ORAL | Status: DC | PRN
  Administered 2019-07-10 – 2019-07-13 (×4): 650 mg via ORAL
  Filled 2019-07-09 (×4): qty 2

## 2019-07-09 MED ORDER — ACETAMINOPHEN 650 MG RE SUPP: 650.0000 mg | Freq: Four times a day (QID) | RECTAL | Status: DC | PRN

## 2019-07-09 MED ORDER — SODIUM CHLORIDE 0.9 % IV SOLN
1.0000 g | Freq: Once | INTRAVENOUS | Status: AC
  Administered 2019-07-09: 1 g via INTRAVENOUS
  Filled 2019-07-09: qty 10

## 2019-07-09 MED ORDER — ATORVASTATIN CALCIUM 10 MG PO TABS
10.0000 mg | ORAL_TABLET | Freq: Every day | ORAL | Status: DC
  Administered 2019-07-09: 10 mg via ORAL
  Filled 2019-07-09: qty 1

## 2019-07-09 MED ORDER — FENTANYL CITRATE (PF) 100 MCG/2ML IJ SOLN
25.0000 ug | Freq: Once | INTRAMUSCULAR | Status: AC
  Administered 2019-07-09: 25 ug via INTRAVENOUS
  Filled 2019-07-09: qty 2

## 2019-07-09 MED ORDER — HYDROCODONE-ACETAMINOPHEN 5-325 MG PO TABS
1.0000 | ORAL_TABLET | Freq: Four times a day (QID) | ORAL | Status: DC | PRN
  Administered 2019-07-10 (×2): 1 via ORAL
  Administered 2019-07-11: 2 via ORAL
  Administered 2019-07-11: 1 via ORAL
  Administered 2019-07-11: 2 via ORAL
  Administered 2019-07-11 – 2019-07-13 (×3): 1 via ORAL
  Filled 2019-07-09: qty 1
  Filled 2019-07-09: qty 2
  Filled 2019-07-09 (×2): qty 1
  Filled 2019-07-09: qty 2
  Filled 2019-07-09: qty 1
  Filled 2019-07-09: qty 2
  Filled 2019-07-09 (×2): qty 1

## 2019-07-09 MED ORDER — DONEPEZIL HCL 5 MG PO TABS
5.0000 mg | ORAL_TABLET | Freq: Every day | ORAL | Status: DC
  Administered 2019-07-09 – 2019-07-12 (×4): 5 mg via ORAL
  Filled 2019-07-09 (×5): qty 1

## 2019-07-09 MED ORDER — MORPHINE SULFATE (PF) 2 MG/ML IV SOLN
0.5000 mg | INTRAVENOUS | Status: DC | PRN
  Administered 2019-07-12: 0.5 mg via INTRAVENOUS
  Filled 2019-07-09: qty 1

## 2019-07-09 MED ORDER — VITAMIN B-12 1000 MCG PO TABS
1000.0000 ug | ORAL_TABLET | Freq: Every day | ORAL | Status: DC
  Administered 2019-07-09 – 2019-07-13 (×5): 1000 ug via ORAL
  Filled 2019-07-09 (×5): qty 1

## 2019-07-09 MED ORDER — SENNA 8.6 MG PO TABS
1.0000 | ORAL_TABLET | Freq: Every day | ORAL | Status: DC
  Administered 2019-07-09 – 2019-07-12 (×4): 8.6 mg via ORAL
  Filled 2019-07-09 (×4): qty 1

## 2019-07-09 MED ORDER — SODIUM CHLORIDE 0.9 % IV BOLUS
1000.0000 mL | Freq: Once | INTRAVENOUS | Status: AC
  Administered 2019-07-09: 1000 mL via INTRAVENOUS

## 2019-07-09 MED ORDER — SODIUM CHLORIDE 0.9 % IV BOLUS: 1000.0000 mL | Freq: Once | INTRAVENOUS | Status: DC

## 2019-07-09 NOTE — ED Triage Notes (Signed)
Pt here from home where she lives alone, her neighbor came over to check on her and heard her screaming. Pt was found lying on her back beside the floor, alert, but unsure how she got there. She thinks she has been on the floor for a day and a half. L buttocks, bilateral legs, and elbows hurt. Skin breakdown noted to joints. EMS reports GCS 15 for entirety.

## 2019-07-09 NOTE — ED Notes (Signed)
Pt transported to xray 

## 2019-07-09 NOTE — ED Notes (Signed)
Followed up with dialysis nurse. Stated nurse is aware and will come collect fluid when finished with current treatment.

## 2019-07-09 NOTE — ED Notes (Signed)
Pt was given an ED happy meal and water per [employer] instruction and pt request.

## 2019-07-09 NOTE — Progress Notes (Signed)
ANTICOAGULATION CONSULT NOTE - Initial Consult  Pharmacy Consult for Warfarin  Indication: MVR + Afib  Allergies  Allergen Reactions  . Carvedilol Itching  . Ace Inhibitors Other (See Comments)    Reaction (??)  . Amlodipine Swelling  . Hydrochlorothiazide Other (See Comments)    Hyponatremia   . Metoprolol Other (See Comments)    Dizziness     Patient Measurements:   Heparin Dosing Weight:   Vital Signs: Temp: 98.5 F (36.9 C) (02/13 1619) BP: 145/80 (02/13 2050) Pulse Rate: 111 (02/13 2050)  Labs: Recent Labs    07/09/19 1649 07/09/19 1945  HGB 14.4  --   HCT 44.1  --   PLT 263  --   LABPROT 47.3*  --   INR 5.1*  --   CREATININE 0.92  --   CKTOTAL 835*  --   TROPONINIHS 53* 77*    Estimated Creatinine Clearance: 46.6 mL/min (by C-G formula based on SCr of 0.92 mg/dL).   Medical History: Past Medical History:  Diagnosis Date  . Broken hip ([place]) 10/2011   left  . Broken wrist   . Depression   . Diarrhea   . H/O long-term (current) use of anticoagulants   . High cholesterol   . Hypertension   . Memory difficulty 01/18/2016  . 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 Osteoporosis   . SIADH (syndrome of inappropriate ADH production) ([place])   . Urinary retention    chronic  . Vertigo 01/18/2016    Medications:  Scheduled:  . [START ON 07/10/2019] alendronate  70 mg Oral Q Sun  . atorvastatin  10 mg Oral QHS  . donepezil  5 mg Oral QHS  . folic acid  1 mg Oral Daily  . hydrALAZINE  50 mg Oral TID  . senna  1 tablet Oral QHS  . vitamin B-12  1,000 mcg Oral Daily    Assessment: Patient is a 45 yof that in on warfarin for Afib and hx of a MVR. Patient has been having elevated INRs recently. PTA her dose was Warfarin 4mg  daily except Tues 2mg .   Goal of Therapy:  INR 2.5-3.5 Monitor platelets by anticoagulation protocol: Yes   Plan:  - INR is supra-therapeutic at 5.1 - Will hold warfarin tonight  - PT-INR daily and monitor CBC  - Monitor  patient for s/s of bleeding due to elevated INR   Duanne Limerick PharmD. BCPS 07/09/2019,9:09 PM

## 2019-07-09 NOTE — H&P (Signed)
History and Physical    Jodi Frank WRU:[id number] DOB: Nov 05, 1940 DOA: 07/09/2019  PCP: [name], NP Patient coming from: Home  Chief Complaint: Fall  HPI: Jodi Frank is a 79 y.o. female with medical history significant of vascular dementia, paroxysmal A. 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  EMS reported that patient lives alone.  Her neighbor came over to check on her after she was heard screaming.  She did not remember falling.  EMS believes she fell out of bed and was found lying on her back next to her bed.  Patient states a day and a half ago she fell out of her bed and since then has not been able to get up from the floor.  She is having pain in her left buttock.  She does not remember how exactly she fell.  Denies loss of consciousness.  She is not sure if she injured her head.  Denies any recent illness.  Denies fevers, chills, lightheadedness/dizziness, chest pain, palpitations, cough, shortness of breath, nausea, vomiting, abdominal pain, or diarrhea.  States she was previously taking Coumadin 4 mg 5 days a week and 2 mg 2 days a week.  2 weeks ago the dose of her Coumadin was changed, she is now taking Coumadin 4 mg 6 days a week and 2 mg 1 day a week.  ED Course: Afebrile.  Found to be in A. fib with RVR with rate up to 140s.  Blood pressure elevated.  Labs showing WBC count 20.7.  Lactic acid normal.  Hemoglobin and platelet count normal.  Sodium 133.  Blood glucose 112.  BUN 15, creatinine 0.9.  T bili 3.1 and AST borderline elevated.  ALT and alk phos normal.  INR 5.1.  UA pending.  Initial high-sensitivity troponin 53, repeat pending.  EKG without acute ischemic changes.  CK 835.  SARS-CoV-2 PCR test pending.  Blood culture x2 pending. Chest x-ray showing borderline cardiomegaly, probable chronic interstitial lung disease, and no acute findings. Pelvic x-ray  negative for fracture or dislocation. X-ray of left femur negative for acute findings. CT head negative for acute intracranial abnormality. CT C-spine negative for acute traumatic injury. CT abdomen pelvis showing left sacral ala and superior and inferior pubic rami fractures. Patient received fentanyl and 1L fluid.  Review of Systems:  All systems reviewed and apart from history of presenting illness, are negative.  Past Medical History:  Diagnosis Date  . Broken hip ([place]) 10/2011   left  . Broken wrist   . Depression   . Diarrhea   . H/O long-term (current) use of anticoagulants   . High cholesterol   . Hypertension   . Memory difficulty 01/18/2016  . 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 Osteoporosis   . SIADH (syndrome of inappropriate ADH production) ([place])   . Urinary retention    chronic  . Vertigo 01/18/2016    Past Surgical History:  Procedure Laterality Date  . HEMATOMA EVACUATION Left 11/25/2012   Procedure: EVACUATION HEMATOMA;  Surgeon: Linna Hoff, MD;  Location: [place];  Service: Orthopedics;  Laterality: Left;  . HIP PINNING,CANNULATED Left 11/03/2012   Procedure: CANNULATED HIP PINNING;  Surgeon: Tobi Bastos, MD;  Location: [facility];  Service: Orthopedics;  Laterality: Left;  . IR INJECT/THERA/INC NEEDLE/CATH/PLC EPI/LUMB/SAC W/IMG  07/20/2017  . 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  Marland Kitchen  OPEN REDUCTION INTERNAL FIXATION (ORIF) WRIST WITH ILIAC CREST BONE GRAFT Left 11/24/2012   Procedure: OPEN REDUCTION INTERNAL FIXATION LEFT WRIST POSSIBLE BONE GRAFTING AND PINNING (ORIF) ;  Surgeon: Linna Hoff, MD;  Location: [place];  Service: Orthopedics;  Laterality: Left;  . SHOULDER OPEN ROTATOR CUFF REPAIR       reports that she has never smoked. She has never used smokeless tobacco. She reports previous alcohol use. She reports that she does not use drugs.  Allergies  Allergen Reactions  . Carvedilol Itching  . Ace Inhibitors Other (See  Comments)    Reaction (??)  . Amlodipine Swelling  . Hydrochlorothiazide Other (See Comments)    Hyponatremia   . Metoprolol Other (See Comments)    Dizziness     Family History  Problem Relation Age of Onset  . Stroke Mother   . Hypertension Mother   . Stroke Father   . Colon cancer Paternal Grandmother   . Parkinsonism Brother     Prior to Admission medications   Medication Sig Start Date End Date Taking? Authorizing Provider  alendronate (FOSAMAX) 70 MG tablet Take 1 tablet (70 mg total) by mouth every Sunday. Take with a full glass of water on an empty stomach. 05/15/19  Yes [name], NP  atorvastatin (LIPITOR) 10 MG tablet TAKE ONE TABLET BY MOUTH EVERY NIGHT AT BEDTIME Patient taking differently: Take 10 mg by mouth at bedtime.  06/13/19  Yes [name], NP  donepezil (ARICEPT) 5 MG tablet TAKE ONE TABLET BY MOUTH EVERY NIGHT AT BEDTIME Patient taking differently: Take 5 mg by mouth at bedtime.  06/13/19  Yes [name], NP  folic acid (FOLVITE) 1 MG tablet Take 1 tablet (1 mg total) by mouth daily. 05/13/19  Yes [name], NP  hydrALAZINE (APRESOLINE) 50 MG tablet TAKE ONE TABLET BY MOUTH THREE TIMES A DAY Patient taking differently: Take 50 mg by mouth 3 (three) times daily.  03/07/19  Yes [name], NP  meclizine (ANTIVERT) 25 MG tablet Take 1 tablet (25 mg total) by mouth every 8 (eight) hours as needed for dizziness. 11/25/18  Yes Reed, Tiffany L, DO  spironolactone (ALDACTONE) 25 MG tablet TAKE ONE-HALF TABLET BY MOUTH DAILY Patient taking differently: Take 12.5 mg by mouth daily.  06/13/19  Yes [name], NP  vitamin B-12 (CYANOCOBALAMIN) 1000 MCG tablet Take 1 tablet (1,000 mcg total) by mouth daily. 05/13/19  Yes [name], NP  warfarin (COUMADIN) 4 MG tablet 4 mg daily by mouth except Tuesday take 2 mg 07/01/19  Yes [name], NP  aspirin EC 81 MG tablet Take 1 tablet (81 mg total) by mouth  daily. Patient not taking: Reported on 07/01/2019 11/13/17   Sueanne Margarita, MD    Physical Exam: Vitals:   07/09/19 1845 07/09/19 1846 07/09/19 1942 07/09/19 2050  BP: (!) 178/89  (!) 159/86 (!) 145/80  Pulse: 100  (!) 101 (!) 111  Resp: (!) 23 (!) 23 (!) 25 (!) 24  Temp:      SpO2: 92%  95% 91%    Physical Exam  Constitutional: She is oriented to person, place, and time. She appears well-developed and well-nourished. No distress.  HENT:  Head: Normocephalic.  Eyes: Right eye exhibits no discharge. Left eye exhibits no discharge.  Cardiovascular: Normal rate and intact distal pulses.  Irregularly irregular rhythm  Pulmonary/Chest: Effort normal and breath sounds normal. No respiratory distress. She has no wheezes. She  has no rales.  Abdominal: Soft. Bowel sounds are normal. She exhibits no distension. There is no abdominal tenderness. There is no guarding.  Musculoskeletal:        General: No edema.     Cervical back: Neck supple.  Neurological: She is alert and oriented to person, place, and time.  Skin: Skin is warm and dry. She is not diaphoretic.     Labs on Admission: I have personally reviewed following labs and imaging studies  CBC: Recent Labs  Lab 07/09/19 1649  WBC 20.7*  NEUTROABS 19.0*  HGB 14.4  HCT 44.1  MCV 84.8  PLT 644   Basic Metabolic Panel: Recent Labs  Lab 07/09/19 1649  NA 133*  K 4.6  CL 98  CO2 21*  GLUCOSE 112*  BUN 15  CREATININE 0.92  CALCIUM 9.1   GFR: Estimated Creatinine Clearance: 46.6 mL/min (by C-G formula based on SCr of 0.92 mg/dL). Liver Function Tests: Recent Labs  Lab 07/09/19 1649  AST 46*  ALT 28  ALKPHOS 56  BILITOT 3.1*  PROT 6.9  ALBUMIN 4.1   No results for input(s): LIPASE, AMYLASE in the last 168 hours. No results for input(s): AMMONIA in the last 168 hours. Coagulation Profile: Recent Labs  Lab 07/09/19 1649  INR 5.1*   Cardiac Enzymes: Recent Labs  Lab 07/09/19 1649  CKTOTAL 835*   BNP  (last 3 results) No results for input(s): PROBNP in the last 8760 hours. HbA1C: No results for input(s): HGBA1C in the last 72 hours. CBG: No results for input(s): GLUCAP in the last 168 hours. Lipid Profile: No results for input(s): CHOL, HDL, LDLCALC, TRIG, CHOLHDL, LDLDIRECT in the last 72 hours. Thyroid Function Tests: No results for input(s): TSH, T4TOTAL, FREET4, T3FREE, THYROIDAB in the last 72 hours. Anemia Panel: No results for input(s): VITAMINB12, FOLATE, FERRITIN, TIBC, IRON, RETICCTPCT in the last 72 hours. Urine analysis:    Component Value Date/Time   COLORURINE YELLOW 07/09/2019 2026   APPEARANCEUR HAZY (A) 07/09/2019 2026   LABSPEC 1.024 07/09/2019 2026   PHURINE 5.0 07/09/2019 2026   GLUCOSEU NEGATIVE 07/09/2019 2026   HGBUR SMALL (A) 07/09/2019 2026   BILIRUBINUR NEGATIVE 07/09/2019 2026   BILIRUBINUR neg 08/21/2017 1400   KETONESUR 80 (A) 07/09/2019 2026   PROTEINUR 100 (A) 07/09/2019 2026   UROBILINOGEN 0.2 08/21/2017 1400   UROBILINOGEN 1.0 11/02/2012 1951   NITRITE NEGATIVE 07/09/2019 2026   LEUKOCYTESUR MODERATE (A) 07/09/2019 2026    Radiological Exams on Admission: CT ABDOMEN PELVIS WO CONTRAST  Result Date: 07/09/2019 CLINICAL DATA:  Recent fall with back and buttock pain EXAM: CT ABDOMEN AND PELVIS WITHOUT CONTRAST TECHNIQUE: Multidetector CT imaging of the abdomen and pelvis was performed following the standard protocol without IV contrast. COMPARISON:  07/12/2017 FINDINGS: Lower chest: No acute abnormality. Hepatobiliary: No focal liver abnormality is seen. No gallstones, gallbladder wall thickening, or biliary dilatation. Pancreas: Unremarkable. No pancreatic ductal dilatation or surrounding inflammatory changes. Spleen: Normal in size without focal abnormality. Adrenals/Urinary Tract: Adrenal glands are within normal limits. Kidneys demonstrate a normal appearance without renal calculi or obstructive change. The bladder is well distended.  Stomach/Bowel: The appendix is within normal limits. No obstructive or inflammatory changes of the colon are seen. The small bowel is within normal limits. Stomach is unremarkable. Vascular/Lymphatic: Aortic atherosclerosis. No enlarged abdominal or pelvic lymph nodes. 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 Reproductive: Uterine calcifications are seen stable from the prior exam. No adnexal mass is noted. Other: No abdominal  wall hernia or abnormality. No abdominopelvic ascites. Musculoskeletal: Postsurgical changes are noted in the proximal left femur. Left superior and inferior pubic rami fractures are noted. Left sacral ala fractures are noted as well. Degenerative changes of the lumbar spine are seen. IMPRESSION: Left sacral ala and superior and inferior pubic rami fractures. Chronic changes as described above. Electronically Signed   By: Inez Catalina M.D.   On: 07/09/2019 19:27   DG Chest 1 View  Result Date: 07/09/2019 CLINICAL DATA:  Found down. EXAM: CHEST  1 VIEW COMPARISON:  Chest x-ray dated 05/04/2017. FINDINGS: Borderline cardiomegaly. Coarse lung markings bilaterally suggesting some degree of chronic interstitial lung disease. No confluent opacity to suggest pneumonia or pulmonary edema. No pleural effusion or pneumothorax is seen. Osseous structures about the chest are unremarkable. Median sternotomy wires appear intact and stable in alignment. IMPRESSION: 1. No acute findings. No evidence of pneumonia or pulmonary edema. 2. 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 3. Borderline cardiomegaly. Electronically Signed   By: Franki Cabot M.D.   On: 07/09/2019 18:05   DG Pelvis 1-2 Views  Result Date: 07/09/2019 CLINICAL DATA:  Found down. EXAM: PELVIS - 1-2 VIEW COMPARISON:  None. FINDINGS: Single-view of the pelvis. Osseous structures appear normally aligned. No evidence of acute fracture or dislocation. Two screws appear appropriately positioned within the LEFT femoral neck. Soft tissues about the  pelvis are unremarkable. IMPRESSION: No acute findings. Electronically Signed   By: Franki Cabot M.D.   On: 07/09/2019 18:06   CT Head Wo Contrast  Result Date: 07/09/2019 CLINICAL DATA:  Headache, Coumadin, found on floor EXAM: CT HEAD WITHOUT CONTRAST CT CERVICAL SPINE WITHOUT CONTRAST TECHNIQUE: Multidetector CT imaging of the head and cervical spine was performed following the standard protocol without intravenous contrast. Multiplanar CT image reconstructions of the cervical spine were also generated. COMPARISON:  CT 05/06/2019, 05/04/2017 FINDINGS: CT HEAD FINDINGS Brain: No evidence of acute infarction, hemorrhage, hydrocephalus, extra-axial collection or mass lesion/mass effect. Patchy hypodensity in the white matter consistent with chronic small vessel ischemic change. 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 Atrophy Vascular: No hyperdense vessels.  Carotid vascular calcification Skull: Normal. Negative for fracture or focal lesion. Sinuses/Orbits: No acute finding. Mild mucosal thickening in the sinuses Other: None CT CERVICAL SPINE FINDINGS Alignment: Straightening of the cervical spine. Trace retrolisthesis C3 on C4 without change. Facet alignment is maintained Skull base and vertebrae: No acute fracture. No primary bone lesion or focal pathologic process. Soft tissues and spinal canal: No prevertebral fluid or swelling. No visible canal hematoma. Disc levels: Moderate diffuse degenerative change C3 through C7. Chronic superior endplate deformity at T1. Posterior disc osteophyte at C4-C5 results in mild canal narrowing. Facet degenerative change at multiple levels. Multiple level foraminal stenosis. Upper chest: Negative. Other: None IMPRESSION: 1. No CT evidence for acute intracranial abnormality. Atrophy and chronic small vessel ischemic changes of the white matter. 2. Degenerative changes of the cervical spine. No acute osseous abnormality. Electronically Signed   By: Donavan Foil  M.D.   On: 07/09/2019 18:25   CT Cervical Spine Wo Contrast  Result Date: 07/09/2019 CLINICAL DATA:  Headache, Coumadin, found on floor EXAM: CT HEAD WITHOUT CONTRAST CT CERVICAL SPINE WITHOUT CONTRAST TECHNIQUE: Multidetector CT imaging of the head and cervical spine was performed following the standard protocol without intravenous contrast. Multiplanar CT image reconstructions of the cervical spine were also generated. COMPARISON:  CT 05/06/2019, 05/04/2017 FINDINGS: CT HEAD FINDINGS Brain: No evidence of acute infarction, hemorrhage, hydrocephalus, extra-axial  collection or mass lesion/mass effect. Patchy hypodensity in the white matter consistent with chronic small vessel ischemic change. 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 Atrophy Vascular: No hyperdense vessels.  Carotid vascular calcification Skull: Normal. Negative for fracture or focal lesion. Sinuses/Orbits: No acute finding. Mild mucosal thickening in the sinuses Other: None CT CERVICAL SPINE FINDINGS Alignment: Straightening of the cervical spine. Trace retrolisthesis C3 on C4 without change. Facet alignment is maintained Skull base and vertebrae: No acute fracture. No primary bone lesion or focal pathologic process. Soft tissues and spinal canal: No prevertebral fluid or swelling. No visible canal hematoma. Disc levels: Moderate diffuse degenerative change C3 through C7. Chronic superior endplate deformity at T1. Posterior disc osteophyte at C4-C5 results in mild canal narrowing. Facet degenerative change at multiple levels. Multiple level foraminal stenosis. Upper chest: Negative. Other: None IMPRESSION: 1. No CT evidence for acute intracranial abnormality. Atrophy and chronic small vessel ischemic changes of the white matter. 2. Degenerative changes of the cervical spine. No acute osseous abnormality. Electronically Signed   By: Donavan Foil M.D.   On: 07/09/2019 18:25   DG FEMUR MIN 2 VIEWS LEFT  Result Date:  07/09/2019 CLINICAL DATA:  Found down. EXAM: LEFT FEMUR 2 VIEWS COMPARISON:  None. FINDINGS: No evidence of acute osseous fracture or dislocation. Fixation screws are present at the LEFT femoral neck. The screws appear intact and appropriately positioned. Adjacent soft tissues are unremarkable. IMPRESSION: No acute findings. Electronically Signed   By: Franki Cabot M.D.   On: 07/09/2019 18:03    EKG: Independently reviewed.  Atrial fibrillation, heart rate 108.  LBBB, T wave abnormality in inferior leads.  No significant change compared to prior tracings.  Assessment/Plan Principal Problem:   Pelvic fracture (HCC) Active Problems:   Essential hypertension, benign   Long term (current) use of anticoagulants   H/O mitral valve replacement with mechanical valve   Mixed hyperlipidemia   Rhabdomyolysis   Atrial fibrillation with rapid ventricular response (HCC)   Pelvic fractures secondary to a fall CT abdomen pelvis showing left sacral ala and superior and inferior pubic rami fractures.  Discussed the case with Dr. Tonita Cong from [facility] who confirmed that these fractures are treated nonoperatively. -Pain management: Morphine as needed, Norco as needed -PT and OT evaluation -Weightbearing as tolerated -Bowel regimen  Mild traumatic rhabdomyolysis CK 835.  Renal function at baseline. -IV fluid hydration -Continue to monitor CK  Supratherapeutic INR in the setting of chronic anticoagulation with Coumadin for paroxysmal A. fib and history of mechanical mitral valve replacement Patient reports change in her Coumadin dosing 2 weeks ago.  INR currently supratherapeutic at 5.1.  No signs of active bleeding. -Hold Coumadin.  Continue to monitor PT/INR.  Coumadin dosing per pharmacy. -Goal INR 2.5-3.5.  A. fib with RVR Known history of A. fib, found to be in RVR with rate up to 140s in the ED.  Likely precipitated by stress/dehydration.  Rate currently improved in the 90s.  Not on any AV  nodal blocking agent at home. -Cardiac monitoring -On Coumadin for chronic anticoagulation.  Holding at this time as INR supratherapeutic.  Continue to monitor PT/INR.  Coumadin dosing per pharmacy. -Lopressor as needed for heart rate greater than 110  History of mechanical mitral valve replacement -On Coumadin for chronic anticoagulation.  Holding at this time as INR supratherapeutic.  Continue to monitor PT/INR.  Coumadin dosing per pharmacy.  Leukocytosis Likely reactive.  WBC count 20.7.  Afebrile.  Lactic acid normal.  Chest x-ray not suggestive of pneumonia. -UA pending -Continue to monitor WBC count  Mild hyponatremia Sodium 133.  History of SIADH listed in the chart. -Currently receiving IV fluid for mild rhabdo/dehydration -Continue to monitor sodium level closely  Elevated LFTs T bili 3.1 and AST borderline elevated.  ALT and alk phos normal.  No gallstones or biliary dilatation seen on abdominal CT. -Right upper quadrant ultrasound  Mild troponin elevation Likely secondary demand ischemia in the setting of A. fib with RVR.  Initial high-sensitivity troponin 53.  EKG without acute ischemic changes.  Patient is not endorsing any anginal symptoms. -Cardiac monitoring -Trend troponin  Vascular dementia -Continue Aricept  Hypertension -Continue home hydralazine -Lopressor as needed  Hyperlipidemia -Continue Lipitor  DVT prophylaxis: On chronic anticoagulation with Coumadin.  INR currently supratherapeutic. Code Status: Patient wishes to be DNR. Family Communication: No family available at this time. Disposition Plan: Anticipate discharge after clinical improvement. Consults called: Orthopedics Admission status: It is my clinical opinion that admission to INPATIENT is reasonable and necessary in this 79 y.o. female . presenting with pelvic fracture secondary to a fall.  Needs pain control, PT and OT evaluation, and possible SNF placement.  Given the aforementioned,  the predictability of an adverse outcome is felt to be significant. I expect that the patient will require at least 2 midnights in the hospital to treat this condition.   The medical decision making on this patient was of high complexity and the patient is at high risk for clinical deterioration, therefore this is a level 3 visit.  Shela Leff MD [facility]  If 7PM-7AM, please contact night-coverage [email or web address] Password [facility]  07/09/2019, 9:27 PM

## 2019-07-09 NOTE — ED Provider Notes (Signed)
[facility] EMERGENCY DEPARTMENT Provider Note   CSN: [id number] Arrival date & time: 07/09/19  1608     History No chief complaint on file.   Jodi Frank is a 79 y.o. female.  Level 5 caveat for memory problems.  Patient brought in by EMS after being found down in her house where she lives on her own.  She was screaming for help for a neighbor.  She does not remember falling.  EMS believes she fell out of bed and was found lying on her her back next to her bed complaining of pain to her left buttock.  She does live alone.  She remembers going to bed at night.  She is unclear whether this was Thursday night or Friday night.  Today is Saturday afternoon.  She denies hitting her head or losing consciousness.  She complains of pain to her left buttock and left leg and bilateral elbows.  She has skin breakdown to her elbows bilaterally.  She denies any chest pain or shortness of breath.  She does take Coumadin for history of mitral valve replacement as well as atrial fibrillation.  She denies any focal weakness, numbness or tingling.  EMS reports stable vital signs with stable mental status.  Patient does live alone.  The history is provided by the patient, the EMS personnel and a friend. The history is limited by the condition of the patient.       Past Medical History:  Diagnosis Date  . Broken hip ([place]) 10/2011   left  . Broken wrist   . Depression   . Diarrhea   . H/O long-term (current) use of anticoagulants   . High cholesterol   . Hypertension   . Memory difficulty 01/18/2016  . 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 Osteoporosis   . SIADH (syndrome of inappropriate ADH production) ([place])   . Urinary retention    chronic  . Vertigo 01/18/2016    Patient Active Problem List   Diagnosis Date Noted  . Unsteady gait 08/21/2017  . 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 Right sided sciatica 07/12/2017  . Supratherapeutic INR 07/12/2017  .  Acute urinary retention 07/12/2017  . Leukocytosis 07/12/2017  . Sciatica 07/12/2017  . Anemia 05/15/2017  . Paroxysmal atrial fibrillation ([place]) 05/15/2017  . Abdominal aortic atherosclerosis ([place]) 05/06/2017  . Mild cognitive impairment 05/06/2017  . H/O mitral valve replacement with mechanical valve 05/06/2017  . Scalp laceration 05/05/2017  . Acute encephalopathy 05/05/2017  . Fall 05/04/2017  . Intolerance of drug 07/03/2016  . Memory difficulty 01/18/2016  . Vertigo 01/18/2016  . Mixed hyperlipidemia 07/12/2014  . Thiazide diuretic adverse reaction 01/03/2014  . Osteoporosis 01/14/2013  . External hemorrhoids 12/24/2012  . Hip fracture, left S/P cannulated hip pinning 11/10/2012  . Insomnia 11/10/2012  . Muscle spasm 11/10/2012  . Essential hypertension, benign 11/10/2012  . Constipation 11/10/2012  . Long term (current) use of anticoagulants 11/10/2012  . Femoral neck fracture ([place]) 11/10/2012  . Acute blood loss anemia 11/05/2012  . Overactive bladder 09/05/2011  . Rosacea 09/04/2011  . 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 DIARRHEA 01/30/2010  . DEPRESSION 12/08/2006  . Allergic rhinitis 12/08/2006    Past Surgical History:  Procedure Laterality Date  . HEMATOMA EVACUATION Left 11/25/2012   Procedure: EVACUATION HEMATOMA;  Surgeon: Linna Hoff, MD;  Location: [facility];  Service: Orthopedics;  Laterality: Left;  . HIP PINNING,CANNULATED Left 11/03/2012   Procedure: CANNULATED HIP  PINNING;  Surgeon: Tobi Bastos, MD;  Location: [facility];  Service: Orthopedics;  Laterality: Left;  . IR INJECT/THERA/INC NEEDLE/CATH/PLC EPI/LUMB/SAC W/IMG  07/20/2017  . 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  . OPEN REDUCTION INTERNAL FIXATION (ORIF) WRIST WITH ILIAC CREST BONE GRAFT Left 11/24/2012   Procedure: OPEN REDUCTION INTERNAL FIXATION LEFT WRIST POSSIBLE BONE GRAFTING AND PINNING (ORIF) ;  Surgeon: Linna Hoff, MD;  Location: [facility];  Service: Orthopedics;   Laterality: Left;  . SHOULDER OPEN ROTATOR CUFF REPAIR       OB History   No obstetric history on file.     Family History  Problem Relation Age of Onset  . Stroke Mother   . Hypertension Mother   . Stroke Father   . Colon cancer Paternal Grandmother   . Parkinsonism Brother     Social History   Tobacco Use  . Smoking status: Never Smoker  . Smokeless tobacco: Never Used  Substance Use Topics  . Alcohol use: Not Currently  . Drug use: No    Home Medications Prior to Admission medications   Medication Sig Start Date End Date Taking? Authorizing Provider  alendronate (FOSAMAX) 70 MG tablet Take 1 tablet (70 mg total) by mouth every Sunday. Take with a full glass of water on an empty stomach. 05/15/19   [name], NP  aspirin EC 81 MG tablet Take 1 tablet (81 mg total) by mouth daily. Patient not taking: Reported on 07/01/2019 11/13/17   Sueanne Margarita, MD  atorvastatin (LIPITOR) 10 MG tablet TAKE ONE TABLET BY MOUTH EVERY NIGHT AT BEDTIME 06/13/19   [name], NP  donepezil (ARICEPT) 5 MG tablet TAKE ONE TABLET BY MOUTH EVERY NIGHT AT BEDTIME 06/13/19   [name], NP  folic acid (FOLVITE) 1 MG tablet Take 1 tablet (1 mg total) by mouth daily. 05/13/19   [name], NP  hydrALAZINE (APRESOLINE) 50 MG tablet TAKE ONE TABLET BY MOUTH THREE TIMES A DAY 03/07/19   [name], NP  meclizine (ANTIVERT) 25 MG tablet Take 1 tablet (25 mg total) by mouth every 8 (eight) hours as needed for dizziness. 11/25/18   Reed, Tiffany L, DO  spironolactone (ALDACTONE) 25 MG tablet TAKE ONE-HALF TABLET BY MOUTH DAILY 06/13/19   [name], NP  vitamin B-12 (CYANOCOBALAMIN) 1000 MCG tablet Take 1 tablet (1,000 mcg total) by mouth daily. 05/13/19   [name], NP  warfarin (COUMADIN) 4 MG tablet 4 mg daily by mouth except Tuesday take 2 mg 07/01/19   [name], NP    Allergies    Carvedilol, Ace inhibitors, Amlodipine,  Hydrochlorothiazide, and Metoprolol  Review of Systems   Review of Systems  Constitutional: Positive for fatigue. Negative for activity change and appetite change.  HENT: Negative for congestion and rhinorrhea.   Eyes: Negative for visual disturbance.  Respiratory: Negative for chest tightness and shortness of breath.   Cardiovascular: Positive for palpitations. Negative for chest pain and leg swelling.  Genitourinary: Negative for dysuria and hematuria.  Musculoskeletal: Positive for arthralgias, back pain and myalgias.  Neurological: Positive for weakness. Negative for headaches.   all other systems are negative except as noted in the HPI and PMH.    Physical Exam Updated Vital Signs There were no vitals taken for this visit.  Physical Exam Vitals and nursing note reviewed.  Constitutional:      General: She is not in acute distress.  Appearance: She is well-developed.     Comments: GCS 15, no distress, answers questions appropriately.  Oriented x3  HENT:     Head: Normocephalic and atraumatic.     Mouth/Throat:     Pharynx: No oropharyngeal exudate.  Eyes:     Conjunctiva/sclera: Conjunctivae normal.     Pupils: Pupils are equal, round, and reactive to light.  Neck:     Comments: No meningismus. Cardiovascular:     Rate and Rhythm: Tachycardia present. Rhythm irregular.     Heart sounds: Normal heart sounds. No murmur.  Pulmonary:     Effort: Pulmonary effort is normal. No respiratory distress.     Breath sounds: Normal breath sounds.  Abdominal:     Palpations: Abdomen is soft.     Tenderness: There is no abdominal tenderness. There is no guarding or rebound.  Musculoskeletal:        General: Swelling and tenderness present. Normal range of motion.     Cervical back: Normal range of motion and neck supple.     Comments: Bilateral elbows with erythema and ecchymosis without bony tenderness  Surgical scar to left hip.  No obvious shortening or external rotation.   Pain to palpation of left lateral hip and pelvis.  Pelvis is stable.  There is no T or L-spine tenderness  Skin:    General: Skin is warm.  Neurological:     General: No focal deficit present.     Mental Status: She is alert and oriented to person, place, and time. Mental status is at baseline.     Cranial Nerves: No cranial nerve deficit.     Motor: No abnormal muscle tone.     Coordination: Coordination normal.     Comments:  5/5 strength throughout. CN 2-12 intact.Equal grip strength.   Psychiatric:        Behavior: Behavior normal.     ED Results / Procedures / Treatments   Labs (all labs ordered are listed, but only abnormal results are displayed) Labs Reviewed  CBC WITH DIFFERENTIAL/PLATELET - Abnormal; Notable for the following components:      Result Value   WBC 20.7 (*)    RBC 5.20 (*)    Neutro Abs 19.0 (*)    Lymphs Abs 0.6 (*)    Abs Immature Granulocytes 0.11 (*)    All other components within normal limits  COMPREHENSIVE METABOLIC PANEL - Abnormal; Notable for the following components:   Sodium 133 (*)    CO2 21 (*)    Glucose, Bld 112 (*)    AST 46 (*)    Total Bilirubin 3.1 (*)    All other components within normal limits  PROTIME-INR - Abnormal; Notable for the following components:   Prothrombin Time 47.3 (*)    INR 5.1 (*)    All other components within normal limits  CK - Abnormal; Notable for the following components:   Total CK 835 (*)    All other components within normal limits  URINALYSIS, ROUTINE W REFLEX MICROSCOPIC - Abnormal; Notable for the following components:   APPearance HAZY (*)    Hgb urine dipstick SMALL (*)    Ketones, ur 80 (*)    Protein, ur 100 (*)    Leukocytes,Ua MODERATE (*)    WBC, UA >50 (*)    All other components within normal limits  TROPONIN I (HIGH SENSITIVITY) - Abnormal; Notable for the following components:   Troponin I (High Sensitivity) 53 (*)    All other components within  normal limits  TROPONIN I (HIGH  SENSITIVITY) - Abnormal; Notable for the following components:   Troponin I (High Sensitivity) 77 (*)    All other components within normal limits  CULTURE, BLOOD (ROUTINE X 2)  CULTURE, BLOOD (ROUTINE X 2)  RESPIRATORY PANEL BY RT PCR (FLU A&B, COVID)  URINE CULTURE  LACTIC ACID, PLASMA  LACTIC ACID, PLASMA  CK  CBC  PROTIME-INR    EKG EKG Interpretation  Date/Time:  Saturday July 09 2019 16:16:56 EST Ventricular Rate:  108 PR Interval:    QRS Duration: 136 QT Interval:  369 QTC Calculation: 495 R Axis:   67 Text Interpretation: `Atrial fibrillation IVCD, consider atypical LBBB No significant change was found Confirmed by Ezequiel Essex [phone]) on 07/09/2019 4:27:53 PM   Radiology CT ABDOMEN PELVIS WO CONTRAST  Result Date: 07/09/2019 CLINICAL DATA:  Recent fall with back and buttock pain EXAM: CT ABDOMEN AND PELVIS WITHOUT CONTRAST TECHNIQUE: Multidetector CT imaging of the abdomen and pelvis was performed following the standard protocol without IV contrast. COMPARISON:  07/12/2017 FINDINGS: Lower chest: No acute abnormality. Hepatobiliary: No focal liver abnormality is seen. No gallstones, gallbladder wall thickening, or biliary dilatation. Pancreas: Unremarkable. No pancreatic ductal dilatation or surrounding inflammatory changes. Spleen: Normal in size without focal abnormality. Adrenals/Urinary Tract: Adrenal glands are within normal limits. Kidneys demonstrate a normal appearance without renal calculi or obstructive change. The bladder is well distended. Stomach/Bowel: The appendix is within normal limits. No obstructive or inflammatory changes of the colon are seen. The small bowel is within normal limits. Stomach is unremarkable. Vascular/Lymphatic: Aortic atherosclerosis. No enlarged abdominal or pelvic lymph nodes. 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 Reproductive: Uterine calcifications are seen stable from the prior exam. No adnexal mass is noted. Other: No abdominal wall  hernia or abnormality. No abdominopelvic ascites. Musculoskeletal: Postsurgical changes are noted in the proximal left femur. Left superior and inferior pubic rami fractures are noted. Left sacral ala fractures are noted as well. Degenerative changes of the lumbar spine are seen. IMPRESSION: Left sacral ala and superior and inferior pubic rami fractures. Chronic changes as described above. Electronically Signed   By: Inez Catalina M.D.   On: 07/09/2019 19:27   DG Chest 1 View  Result Date: 07/09/2019 CLINICAL DATA:  Found down. EXAM: CHEST  1 VIEW COMPARISON:  Chest x-ray dated 05/04/2017. FINDINGS: Borderline cardiomegaly. Coarse lung markings bilaterally suggesting some degree of chronic interstitial lung disease. No confluent opacity to suggest pneumonia or pulmonary edema. No pleural effusion or pneumothorax is seen. Osseous structures about the chest are unremarkable. Median sternotomy wires appear intact and stable in alignment. IMPRESSION: 1. No acute findings. No evidence of pneumonia or pulmonary edema. 2. 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 3. Borderline cardiomegaly. Electronically Signed   By: Franki Cabot M.D.   On: 07/09/2019 18:05   DG Pelvis 1-2 Views  Result Date: 07/09/2019 CLINICAL DATA:  Found down. EXAM: PELVIS - 1-2 VIEW COMPARISON:  None. FINDINGS: Single-view of the pelvis. Osseous structures appear normally aligned. No evidence of acute fracture or dislocation. Two screws appear appropriately positioned within the LEFT femoral neck. Soft tissues about the pelvis are unremarkable. IMPRESSION: No acute findings. Electronically Signed   By: Franki Cabot M.D.   On: 07/09/2019 18:06   CT Head Wo Contrast  Result Date: 07/09/2019 CLINICAL DATA:  Headache, Coumadin, found on floor EXAM: CT HEAD WITHOUT CONTRAST CT CERVICAL SPINE WITHOUT CONTRAST TECHNIQUE: Multidetector CT imaging of the head and cervical spine was  performed following the standard protocol without intravenous  contrast. Multiplanar CT image reconstructions of the cervical spine were also generated. COMPARISON:  CT 05/06/2019, 05/04/2017 FINDINGS: CT HEAD FINDINGS Brain: No evidence of acute infarction, hemorrhage, hydrocephalus, extra-axial collection or mass lesion/mass effect. Patchy hypodensity in the white matter consistent with chronic small vessel ischemic change. 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 Atrophy Vascular: No hyperdense vessels.  Carotid vascular calcification Skull: Normal. Negative for fracture or focal lesion. Sinuses/Orbits: No acute finding. Mild mucosal thickening in the sinuses Other: None CT CERVICAL SPINE FINDINGS Alignment: Straightening of the cervical spine. Trace retrolisthesis C3 on C4 without change. Facet alignment is maintained Skull base and vertebrae: No acute fracture. No primary bone lesion or focal pathologic process. Soft tissues and spinal canal: No prevertebral fluid or swelling. No visible canal hematoma. Disc levels: Moderate diffuse degenerative change C3 through C7. Chronic superior endplate deformity at T1. Posterior disc osteophyte at C4-C5 results in mild canal narrowing. Facet degenerative change at multiple levels. Multiple level foraminal stenosis. Upper chest: Negative. Other: None IMPRESSION: 1. No CT evidence for acute intracranial abnormality. Atrophy and chronic small vessel ischemic changes of the white matter. 2. Degenerative changes of the cervical spine. No acute osseous abnormality. Electronically Signed   By: Donavan Foil M.D.   On: 07/09/2019 18:25   CT Cervical Spine Wo Contrast  Result Date: 07/09/2019 CLINICAL DATA:  Headache, Coumadin, found on floor EXAM: CT HEAD WITHOUT CONTRAST CT CERVICAL SPINE WITHOUT CONTRAST TECHNIQUE: Multidetector CT imaging of the head and cervical spine was performed following the standard protocol without intravenous contrast. Multiplanar CT image reconstructions of the cervical spine were also generated.  COMPARISON:  CT 05/06/2019, 05/04/2017 FINDINGS: CT HEAD FINDINGS Brain: No evidence of acute infarction, hemorrhage, hydrocephalus, extra-axial collection or mass lesion/mass effect. Patchy hypodensity in the white matter consistent with chronic small vessel ischemic change. 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 Atrophy Vascular: No hyperdense vessels.  Carotid vascular calcification Skull: Normal. Negative for fracture or focal lesion. Sinuses/Orbits: No acute finding. Mild mucosal thickening in the sinuses Other: None CT CERVICAL SPINE FINDINGS Alignment: Straightening of the cervical spine. Trace retrolisthesis C3 on C4 without change. Facet alignment is maintained Skull base and vertebrae: No acute fracture. No primary bone lesion or focal pathologic process. Soft tissues and spinal canal: No prevertebral fluid or swelling. No visible canal hematoma. Disc levels: Moderate diffuse degenerative change C3 through C7. Chronic superior endplate deformity at T1. Posterior disc osteophyte at C4-C5 results in mild canal narrowing. Facet degenerative change at multiple levels. Multiple level foraminal stenosis. Upper chest: Negative. Other: None IMPRESSION: 1. No CT evidence for acute intracranial abnormality. Atrophy and chronic small vessel ischemic changes of the white matter. 2. Degenerative changes of the cervical spine. No acute osseous abnormality. Electronically Signed   By: Donavan Foil M.D.   On: 07/09/2019 18:25   DG FEMUR MIN 2 VIEWS LEFT  Result Date: 07/09/2019 CLINICAL DATA:  Found down. EXAM: LEFT FEMUR 2 VIEWS COMPARISON:  None. FINDINGS: No evidence of acute osseous fracture or dislocation. Fixation screws are present at the LEFT femoral neck. The screws appear intact and appropriately positioned. Adjacent soft tissues are unremarkable. IMPRESSION: No acute findings. Electronically Signed   By: Franki Cabot M.D.   On: 07/09/2019 18:03    Procedures .Critical Care Performed by:  Ezequiel Essex, MD Authorized by: Ezequiel Essex, MD   Critical care provider statement:    Critical care time (minutes):  19   Critical care was necessary to treat or prevent imminent or life-threatening deterioration of the following conditions:  Dehydration and trauma   Critical care was time spent personally by me on the following activities:  Discussions with consultants, evaluation of patient's response to treatment, examination of patient, ordering and performing treatments and interventions, ordering and review of laboratory studies, ordering and review of radiographic studies, pulse oximetry, re-evaluation of patient's condition, obtaining history from patient or surrogate and review of old charts   (including critical care time)  Medications Ordered in ED Medications - No data to display  ED Course  I have reviewed the triage vital signs and the nursing notes.  Pertinent labs & imaging results that were available during my care of the patient were reviewed by me and considered in my medical decision making (see chart for details).    MDM Rules/Calculators/A&P                     Patient found down at home after likely falling out of bed.  On the floor for possibly 24 hours or more.  Patient in A. fib with RVR.  She is hydrated.  Will obtain labs, imaging and CT head.  INR 5.1.  CK elevation of 800.  Creatinine is normal. Leukocytosis likely reactive as patient has no fever or infectious symptoms  Patient given IV fluids.  Chest x-ray and pelvis x-ray are negative.  Pelvic hardware appears to be stable.  Labs show mild CK elevation with normal creatinine.  Urinalysis with ketones and pyuria.  Will send culture.  Blood culture sent and lactic sent.  Heart rate has improved to 100s to 110s.  Will hold on Cardizem at this time.  INR elevated at 5.1.  CT head and C-spine are negative.  Pelvis imaging shows ramus fractures on the left side as well as a sacral ala  fracture.  Patient will need admission for therapies as well as further hydration.  D/w Dr. Marlowe Sax.   Final Clinical Impression(s) / ED Diagnoses Final diagnoses:  Elevated LFTs  Fall, initial encounter  Multiple closed fractures of pelvis with stable disruption of pelvic ring, initial encounter [facility])    Rx / DC Orders ED Discharge Orders    None       Manvi Guilliams, Annie Main, MD 07/10/19 [phone]

## 2019-07-09 NOTE — ED Notes (Signed)
Unsuccessful lab draw x1. 

## 2019-07-09 NOTE — ED Notes (Signed)
Pt to Ct

## 2019-07-09 NOTE — ED Notes (Signed)
Tele   [phone] pts daughter Maribi Malley please update

## 2019-07-10 ENCOUNTER — Inpatient Hospital Stay (HOSPITAL_COMMUNITY): Payer: Medicare HMO

## 2019-07-10 ENCOUNTER — Encounter (HOSPITAL_COMMUNITY): Payer: Self-pay | Admitting: Internal Medicine

## 2019-07-10 LAB — CBC
HCT: 38.2 % (ref 36.0–46.0)
Hemoglobin: 12.5 g/dL (ref 12.0–15.0)
MCH: 27.7 pg (ref 26.0–34.0)
MCHC: 32.7 g/dL (ref 30.0–36.0)
MCV: 84.7 fL (ref 80.0–100.0)
Platelets: 208 10*3/uL (ref 150–400)
RBC: 4.51 MIL/uL (ref 3.87–5.11)
RDW: 14.2 % (ref 11.5–15.5)
WBC: 14.1 10*3/uL — ABNORMAL HIGH (ref 4.0–10.5)
nRBC: 0 % (ref 0.0–0.2)

## 2019-07-10 LAB — CK: Total CK: 393 U/L — ABNORMAL HIGH (ref 38–234)

## 2019-07-10 LAB — BASIC METABOLIC PANEL
Anion gap: 12 (ref 5–15)
BUN: 13 mg/dL (ref 8–23)
CO2: 19 mmol/L — ABNORMAL LOW (ref 22–32)
Calcium: 8.5 mg/dL — ABNORMAL LOW (ref 8.9–10.3)
Chloride: 106 mmol/L (ref 98–111)
Creatinine, Ser: 0.81 mg/dL (ref 0.44–1.00)
GFR calc Af Amer: >60 mL/min (ref >60)
GFR calc non Af Amer: >60 mL/min (ref >60)
Glucose, Bld: 97 mg/dL (ref 70–99)
Potassium: 3.7 mmol/L (ref 3.5–5.1)
Sodium: 137 mmol/L (ref 135–145)

## 2019-07-10 LAB — PROTIME-INR
INR: 6.5 (ref 0.8–1.2)
Prothrombin Time: 57.3 seconds — ABNORMAL HIGH (ref 11.4–15.2)

## 2019-07-10 LAB — TROPONIN I (HIGH SENSITIVITY): Troponin I (High Sensitivity): 76 ng/L — ABNORMAL HIGH (ref <18)

## 2019-07-10 MED ORDER — WARFARIN - PHARMACIST DOSING INPATIENT: Freq: Every day | Status: DC

## 2019-07-10 MED ORDER — METOPROLOL SUCCINATE ER 25 MG PO TB24
12.5000 mg | ORAL_TABLET | Freq: Every day | ORAL | Status: DC
  Administered 2019-07-10 – 2019-07-13 (×4): 12.5 mg via ORAL
  Filled 2019-07-10 (×4): qty 1

## 2019-07-10 NOTE — Progress Notes (Addendum)
Pt stable. Pt complains of pain during activities, but otherwise resting well and pain managed with PRN tylenol. Skin assessment shows redness to bottom (foam applied), redness on both heel (foam applied), redness and skin discoloration on bilateral elbows d/t to fall, minor bruising throughout body(mainly shins). Spoke with Pam (pt's daughter with an update). Pt educated on why she was not receiving her nightly coumadin. Pt HR increases when moving. Will continue monitoring pt.   Bladder  Scan >445 In&out cath 1400 @ 0640 Pt states she feels the need to pee but cannot. Little urine in purewick canister  Urine Culture sent off @0650 

## 2019-07-10 NOTE — Progress Notes (Signed)
ANTICOAGULATION CONSULT NOTE - Initial Consult  Pharmacy Consult for Warfarin  Indication: MVR + Afib  Allergies  Allergen Reactions  . Carvedilol Itching  . Ace Inhibitors Other (See Comments)    Reaction (??)  . Amlodipine Swelling  . Hydrochlorothiazide Other (See Comments)    Hyponatremia   . Metoprolol Other (See Comments)    Dizziness     Patient Measurements:   Heparin Dosing Weight:   Vital Signs: Temp: 99 F (37.2 C) (02/14 0341) Temp Source: Oral (02/14 0341) BP: 128/66 (02/14 0341) Pulse Rate: 97 (02/14 0425)  Labs: Recent Labs    07/09/19 1649 07/09/19 1945 07/10/19 0646  HGB 14.4  --  12.5  HCT 44.1  --  38.2  PLT 263  --  208  LABPROT 47.3*  --   --   INR 5.1*  --   --   CREATININE 0.92  --   --   CKTOTAL 835*  --   --   TROPONINIHS 53* 77*  --     Estimated Creatinine Clearance: 46.6 mL/min (by C-G formula based on SCr of 0.92 mg/dL).   Medical History: Past Medical History:  Diagnosis Date  . Broken hip ([place]) 10/2011   left  . Broken wrist   . Depression   . Diarrhea   . H/O long-term (current) use of anticoagulants   . High cholesterol   . Hypertension   . Memory difficulty 01/18/2016  . 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 Osteoporosis   . SIADH (syndrome of inappropriate ADH production) ([place])   . Urinary retention    chronic  . Vertigo 01/18/2016    Medications:  Scheduled:  . alendronate  70 mg Oral Q Sun  . atorvastatin  10 mg Oral QHS  . donepezil  5 mg Oral QHS  . folic acid  1 mg Oral Daily  . hydrALAZINE  50 mg Oral TID  . senna  1 tablet Oral QHS  . vitamin B-12  1,000 mcg Oral Daily    Assessment: Patient is a 55 yof that in on warfarin for Afib and hx of a MVR. Patient presents after a fall; patient was found a day and half after fall. PTA her dose was Warfarin 4mg  daily except Tues 2mg . INR on admission was 5.1. Last INR in clinic on 2/5 was 2.3.   2/14: INR 6.5 ([facility]PR[facility] - [facility]), Hbg 12.5  (trending down), plt 208 (wnl). RN reports not concerns or signs of bleeding  Goal of Therapy:  INR 2.5-3.5 Monitor platelets by anticoagulation protocol: Yes   Plan:  HOLD warfarin x 1 dose tonight  - Continue to monitor PT-INR and CBC daily - Monitor patient for s/s of bleeding due to elevated INR   Acey Lav, PharmD  PGY1 Acute Care Pharmacy Resident 07/10/2019,8:01 AM

## 2019-07-10 NOTE — Progress Notes (Addendum)
2228: Pt seems more forgetful tonight. Pt asking same question over and over. Notified Dr. Silas [place] about pt HR in 140s, pt was anxious during time of elevatiion. Pt states she is not in pain unless reposition. Will continue to monitor until otherwise advised.  0315: Pt continuously calling out stating she is scared someone is coming to kill her. She thinks she is at home. Pt is reoriented and then shortly after, she is calling out again scared. Pt does not stay oriented long.   Pt voided in the purewick throughout the night.

## 2019-07-10 NOTE — Progress Notes (Signed)
- PROGRESS NOTE    Jodi Frank  [id number] DOB: 1941-03-05 DOA: 07/09/2019 PCP: [name], NP   Brief Narrative: Patient is a 79 year old female with history of vascular dementia, paroxysmal A. 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  She lives alone and ambulates with the help of walker.  She fell out of her bed and fall landing on her back.  She felt pain on her left buttock after the fall.  No loss of consciousness.  No head injury.  On presentation she was found to be A. fib with RVR, hypertension, and leukocytosis.  CK was elevated .    CT abdomen/pelvis showed left sacral alae and superior and inferior pubic rami fracture.  Case discussed with orthopedics and planned for conservative management.  PT/OT consulted.  Assessment & Plan:   Principal Problem:   Pelvic fracture (HCC) Active Problems:   Essential hypertension, benign   Long term (current) use of anticoagulants   H/O mitral valve replacement with mechanical valve   Mixed hyperlipidemia   Rhabdomyolysis   Atrial fibrillation with rapid ventricular response (HCC)   Pelvic fracture: Secondary to fall.  CT finding as above.  Discussed with orthopedics, plan for conservative management.  Continue pain management.  PT/OT evaluation.  Weightbearing as tolerated.  Continue bowel regimen  Rhabdomyolysis: Associated with fall.  Mildly  elevated CK level.  Continue gentle IV fluids  Paroxysmal A. fib with RVR: Heart rate in the range of 140s on presentation.  Rate has improved and she is currently in normal sinus rhythm..  Not on any AV nodal blocking agent at home.  On Coumadin for chronic anticoagulation.  Will start on Toprol.  Supratherapeutic INR: INR elevated at 6 on presentation.  We will continue to monitor. Vitamin K not ordered. Goal INR 2.5-3.5.  History of mechanical  mitral valve replacement: On Coumadin.  Leukocytosis: Most likely reactive.  Chest x-ray did not show pneumonia.  Lactate level normal.  She is afebrile.  UA was impressive for UTI but she denies dysuria.  Continue to monitor the trend.  She was given a dose of ceftriaxone in the emergency department.  We will follow-up urine culture.  Elevated LFTs: Mild.  No right upper quadrant tenderness.  Right upper quadrant ultrasound was normal.  Elevated troponin: Mild with flat trend.  Most likely secondary to supply demand ischemia in the setting of A. fib with RVR.  No chest pain.  Vascular dementia: Continue supportive care.  She is on Aricept at home.  Currently alert and oriented  Hypertension: Monitor blood pressure.  Continue current regimen.  Hyperlipidemia: Continue Lipitor             DVT prophylaxis: Supratherapeutic INR Code Status: DNR Family Communication: Called daughter on phone on 07/10/19 Disposition Plan: Patient is from home.  PT/OT evaluation pending.  Most likely sending skilled services on discharge.   Consultants: None  Procedures: None  Antimicrobials:  Anti-infectives (From admission, onward)   Start     Dose/Rate Route Frequency Ordered Stop   07/09/19 2115  cefTRIAXone (ROCEPHIN) 1 g in sodium chloride 0.9 % 100 mL IVPB     1 g 200 mL/hr over 30 Minutes Intravenous  Once 07/09/19 2113 07/09/19 2327      Subjective: Patient seen and examined the bedside this morning.  Hemodynamically stable.  Looks comfortable.  Denied any pain on the hip  while at rest.  But was complaining of pain when she started to move with physical therapy.  Objective: Vitals:   07/09/19 2150 07/10/19 0341 07/10/19 0345 07/10/19 0425  BP: (!) 141/86 128/66    Pulse: (!) 107 98  97  Resp: 20 (!) 21 18 19   Temp: 99.7 F (37.6 C) 99 F (37.2 C)    TempSrc: Oral Oral    SpO2: 95% 95%      Intake/Output Summary (Last 24 hours) at 07/10/2019 0804 Last data filed at 07/10/2019  [id number] Gross per 24 hour  Intake 1124.92 ml  Output 1400 ml  Net -275.08 ml   There were no vitals filed for this visit.  Examination:  General exam: Appears calm and comfortable ,Not in distress,average built, pleasant elderly female HEENT:PERRL,Oral mucosa moist, Ear/Nose normal on gross exam Respiratory system: Bilateral equal air entry, normal vesicular breath sounds, no wheezes or crackles  Cardiovascular system: S1 & S2 heard, RRR. No JVD, murmurs, rubs, gallops or clicks. No pedal edema. Gastrointestinal system: Abdomen is nondistended, soft and nontender. No organomegaly or masses felt. Normal bowel sounds heard. Central nervous system: Alert and oriented. No focal neurological deficits. Extremities: No edema, no clubbing ,no cyanosis, distal peripheral pulses palpable. Skin: No rashes, lesions or ulcers,no icterus ,no pallor     Data Reviewed: I have personally reviewed following labs and imaging studies  CBC: Recent Labs  Lab 07/09/19 1649 07/10/19 0646  WBC 20.7* 14.1*  NEUTROABS 19.0*  --   HGB 14.4 12.5  HCT 44.1 38.2  MCV 84.8 84.7  PLT 263 [id number]   Basic Metabolic Panel: Recent Labs  Lab 07/09/19 1649  NA 133*  K 4.6  CL 98  CO2 21*  GLUCOSE 112*  BUN 15  CREATININE 0.92  CALCIUM 9.1   GFR: Estimated Creatinine Clearance: 46.6 mL/min (by C-G formula based on SCr of 0.92 mg/dL). Liver Function Tests: Recent Labs  Lab 07/09/19 1649  AST 46*  ALT 28  ALKPHOS 56  BILITOT 3.1*  PROT 6.9  ALBUMIN 4.1   No results for input(s): LIPASE, AMYLASE in the last 168 hours. No results for input(s): AMMONIA in the last 168 hours. Coagulation Profile: Recent Labs  Lab 07/09/19 1649  INR 5.1*   Cardiac Enzymes: Recent Labs  Lab 07/09/19 1649  CKTOTAL 835*   BNP (last 3 results) No results for input(s): PROBNP in the last 8760 hours. HbA1C: No results for input(s): HGBA1C in the last 72 hours. CBG: No results for input(s): GLUCAP in the last 168  hours. Lipid Profile: No results for input(s): CHOL, HDL, LDLCALC, TRIG, CHOLHDL, LDLDIRECT in the last 72 hours. Thyroid Function Tests: No results for input(s): TSH, T4TOTAL, FREET4, T3FREE, THYROIDAB in the last 72 hours. Anemia Panel: No results for input(s): VITAMINB12, FOLATE, FERRITIN, TIBC, IRON, RETICCTPCT in the last 72 hours. Sepsis Labs: Recent Labs  Lab 07/09/19 1649 07/09/19 1945  LATICACIDVEN 1.5 1.4    Recent Results (from the past 240 hour(s))  Respiratory Panel by RT PCR (Flu A&B, Covid) - Nasopharyngeal Swab     Status: None   Collection Time: 07/09/19  8:30 PM   Specimen: Nasopharyngeal Swab  Result Value Ref Range Status   SARS Coronavirus 2 by RT PCR NEGATIVE NEGATIVE Final    Comment: (NOTE) SARS-CoV-2 target nucleic acids are NOT DETECTED. The SARS-CoV-2 RNA is generally detectable in upper respiratoy specimens during the acute phase of infection. The lowest concentration of SARS-CoV-2 viral copies this assay can detect  is 131 copies/mL. A negative result does not preclude SARS-Cov-2 infection and should not be used as the sole basis for treatment or other patient management decisions. A negative result may occur with  improper specimen collection/handling, submission of specimen other than nasopharyngeal swab, presence of viral mutation(s) within the areas targeted by this assay, and inadequate number of viral copies (<131 copies/mL). A negative result must be combined with clinical observations, patient history, and epidemiological information. The expected result is Negative. Fact Sheet for Patients:  PinkCheek.be Fact Sheet for Healthcare Providers:  GravelBags.it This test is not yet ap proved or cleared by the [place] FDA and  has been authorized for detection and/or diagnosis of SARS-CoV-2 by FDA under an Emergency Use Authorization (EUA). This EUA will remain  in effect (meaning  this test can be used) for the duration of the COVID-19 declaration under Section 564(b)(1) of the Act, 21 U.S.C. section 360bbb-3(b)(1), unless the authorization is terminated or revoked sooner.    Influenza A by PCR NEGATIVE NEGATIVE Final   Influenza B by PCR NEGATIVE NEGATIVE Final    Comment: (NOTE) The Xpert Xpress SARS-CoV-2/FLU/RSV assay is intended as an aid in  the diagnosis of influenza from Nasopharyngeal swab specimens and  should not be used as a sole basis for treatment. Nasal washings and  aspirates are unacceptable for Xpert Xpress SARS-CoV-2/FLU/RSV  testing. Fact Sheet for Patients: PinkCheek.be Fact Sheet for Healthcare Providers: GravelBags.it This test is not yet approved or cleared by the [place] FDA and  has been authorized for detection and/or diagnosis of SARS-CoV-2 by  FDA under an Emergency Use Authorization (EUA). This EUA will remain  in effect (meaning this test can be used) for the duration of the  Covid-19 declaration under Section 564(b)(1) of the Act, 21  U.S.C. section 360bbb-3(b)(1), unless the authorization is  terminated or revoked. Performed at [facility], [place] [address] [place],  [address]          Radiology Studies: CT ABDOMEN PELVIS WO CONTRAST  Result Date: 07/09/2019 CLINICAL DATA:  Recent fall with back and buttock pain EXAM: CT ABDOMEN AND PELVIS WITHOUT CONTRAST TECHNIQUE: Multidetector CT imaging of the abdomen and pelvis was performed following the standard protocol without IV contrast. COMPARISON:  07/12/2017 FINDINGS: Lower chest: No acute abnormality. Hepatobiliary: No focal liver abnormality is seen. No gallstones, gallbladder wall thickening, or biliary dilatation. Pancreas: Unremarkable. No pancreatic ductal dilatation or surrounding inflammatory changes. Spleen: Normal in size without focal abnormality. Adrenals/Urinary Tract: Adrenal glands are  within normal limits. Kidneys demonstrate a normal appearance without renal calculi or obstructive change. The bladder is well distended. Stomach/Bowel: The appendix is within normal limits. No obstructive or inflammatory changes of the colon are seen. The small bowel is within normal limits. Stomach is unremarkable. Vascular/Lymphatic: Aortic atherosclerosis. No enlarged abdominal or pelvic lymph nodes. 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 Reproductive: Uterine calcifications are seen stable from the prior exam. No adnexal mass is noted. Other: No abdominal wall hernia or abnormality. No abdominopelvic ascites. Musculoskeletal: Postsurgical changes are noted in the proximal left femur. Left superior and inferior pubic rami fractures are noted. Left sacral ala fractures are noted as well. Degenerative changes of the lumbar spine are seen. IMPRESSION: Left sacral ala and superior and inferior pubic rami fractures. Chronic changes as described above. Electronically Signed   By: Inez Catalina M.D.   On: 07/09/2019 19:27   DG Chest 1 View  Result Date: 07/09/2019 CLINICAL DATA:  Found down. EXAM: CHEST  1 VIEW COMPARISON:  Chest x-ray dated 05/04/2017. FINDINGS: Borderline cardiomegaly. Coarse lung markings bilaterally suggesting some degree of chronic interstitial lung disease. No confluent opacity to suggest pneumonia or pulmonary edema. No pleural effusion or pneumothorax is seen. Osseous structures about the chest are unremarkable. Median sternotomy wires appear intact and stable in alignment. IMPRESSION: 1. No acute findings. No evidence of pneumonia or pulmonary edema. 2. 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 3. Borderline cardiomegaly. Electronically Signed   By: Franki Cabot M.D.   On: 07/09/2019 18:05   DG Pelvis 1-2 Views  Result Date: 07/09/2019 CLINICAL DATA:  Found down. EXAM: PELVIS - 1-2 VIEW COMPARISON:  None. FINDINGS: Single-view of the pelvis. Osseous structures appear normally aligned. No  evidence of acute fracture or dislocation. Two screws appear appropriately positioned within the LEFT femoral neck. Soft tissues about the pelvis are unremarkable. IMPRESSION: No acute findings. Electronically Signed   By: Franki Cabot M.D.   On: 07/09/2019 18:06   CT Head Wo Contrast  Result Date: 07/09/2019 CLINICAL DATA:  Headache, Coumadin, found on floor EXAM: CT HEAD WITHOUT CONTRAST CT CERVICAL SPINE WITHOUT CONTRAST TECHNIQUE: Multidetector CT imaging of the head and cervical spine was performed following the standard protocol without intravenous contrast. Multiplanar CT image reconstructions of the cervical spine were also generated. COMPARISON:  CT 05/06/2019, 05/04/2017 FINDINGS: CT HEAD FINDINGS Brain: No evidence of acute infarction, hemorrhage, hydrocephalus, extra-axial collection or mass lesion/mass effect. Patchy hypodensity in the white matter consistent with chronic small vessel ischemic change. 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 Atrophy Vascular: No hyperdense vessels.  Carotid vascular calcification Skull: Normal. Negative for fracture or focal lesion. Sinuses/Orbits: No acute finding. Mild mucosal thickening in the sinuses Other: None CT CERVICAL SPINE FINDINGS Alignment: Straightening of the cervical spine. Trace retrolisthesis C3 on C4 without change. Facet alignment is maintained Skull base and vertebrae: No acute fracture. No primary bone lesion or focal pathologic process. Soft tissues and spinal canal: No prevertebral fluid or swelling. No visible canal hematoma. Disc levels: Moderate diffuse degenerative change C3 through C7. Chronic superior endplate deformity at T1. Posterior disc osteophyte at C4-C5 results in mild canal narrowing. Facet degenerative change at multiple levels. Multiple level foraminal stenosis. Upper chest: Negative. Other: None IMPRESSION: 1. No CT evidence for acute intracranial abnormality. Atrophy and chronic small vessel ischemic changes of  the white matter. 2. Degenerative changes of the cervical spine. No acute osseous abnormality. Electronically Signed   By: Donavan Foil M.D.   On: 07/09/2019 18:25   CT Cervical Spine Wo Contrast  Result Date: 07/09/2019 CLINICAL DATA:  Headache, Coumadin, found on floor EXAM: CT HEAD WITHOUT CONTRAST CT CERVICAL SPINE WITHOUT CONTRAST TECHNIQUE: Multidetector CT imaging of the head and cervical spine was performed following the standard protocol without intravenous contrast. Multiplanar CT image reconstructions of the cervical spine were also generated. COMPARISON:  CT 05/06/2019, 05/04/2017 FINDINGS: CT HEAD FINDINGS Brain: No evidence of acute infarction, hemorrhage, hydrocephalus, extra-axial collection or mass lesion/mass effect. Patchy hypodensity in the white matter consistent with chronic small vessel ischemic change. 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 Atrophy Vascular: No hyperdense vessels.  Carotid vascular calcification Skull: Normal. Negative for fracture or focal lesion. Sinuses/Orbits: No acute finding. Mild mucosal thickening in the sinuses Other: None CT CERVICAL SPINE FINDINGS Alignment: Straightening of the cervical spine. Trace retrolisthesis C3 on C4 without change. Facet alignment is maintained Skull base and vertebrae: No acute fracture. No primary  bone lesion or focal pathologic process. Soft tissues and spinal canal: No prevertebral fluid or swelling. No visible canal hematoma. Disc levels: Moderate diffuse degenerative change C3 through C7. Chronic superior endplate deformity at T1. Posterior disc osteophyte at C4-C5 results in mild canal narrowing. Facet degenerative change at multiple levels. Multiple level foraminal stenosis. Upper chest: Negative. Other: None IMPRESSION: 1. No CT evidence for acute intracranial abnormality. Atrophy and chronic small vessel ischemic changes of the white matter. 2. Degenerative changes of the cervical spine. No acute osseous  abnormality. Electronically Signed   By: Donavan Foil M.D.   On: 07/09/2019 18:25   DG FEMUR MIN 2 VIEWS LEFT  Result Date: 07/09/2019 CLINICAL DATA:  Found down. EXAM: LEFT FEMUR 2 VIEWS COMPARISON:  None. FINDINGS: No evidence of acute osseous fracture or dislocation. Fixation screws are present at the LEFT femoral neck. The screws appear intact and appropriately positioned. Adjacent soft tissues are unremarkable. IMPRESSION: No acute findings. Electronically Signed   By: Franki Cabot M.D.   On: 07/09/2019 18:03   US Abdomen Limited RUQ  Result Date: 07/10/2019 CLINICAL DATA:  Elevated LFTs. EXAM: ULTRASOUND ABDOMEN LIMITED RIGHT UPPER QUADRANT COMPARISON:  CT abdomen and pelvis 07/09/2019 FINDINGS: Gallbladder: No gallstones or wall thickening visualized. No sonographic Murphy sign noted by sonographer. Common bile duct: Diameter: 4.1 mm Liver: No focal lesion identified. Within normal limits in parenchymal echogenicity. Portal vein is patent on color Doppler imaging with normal direction of blood flow towards the liver. Other: None. IMPRESSION: 1. Normal exam. Electronically Signed   By: Kerby Moors M.D.   On: 07/10/2019 06:23        Scheduled Meds: . alendronate  70 mg Oral Q Sun  . atorvastatin  10 mg Oral QHS  . donepezil  5 mg Oral QHS  . folic acid  1 mg Oral Daily  . hydrALAZINE  50 mg Oral TID  . senna  1 tablet Oral QHS  . vitamin B-12  1,000 mcg Oral Daily   Continuous Infusions: . sodium chloride 125 mL/hr at 07/10/19 0124     LOS: 1 day    Time spent: 35 mins.More than 50% of that time was spent in counseling and/or coordination of care.      Shelly Coss, MD [facility] P2/14/2021, 8:04 AM

## 2019-07-10 NOTE — Plan of Care (Signed)

## 2019-07-10 NOTE — Progress Notes (Signed)
Critical INR 6.5. RN messaged Dr Tawanna Solo, provider plan to just monitor the patient

## 2019-07-10 NOTE — Progress Notes (Signed)
OT Evaluation  PTA, pt lived alone and was independent with ADL and mobility; still driving. Pt currently requires Mod A with mobility and ADL and will benefit from rehab at [facility]. Difficulty bearing weight through LLE - would benefit form using Stedy for transfer to [facility] due to pain. Pt forgetful at times during session and required VC for problem solving. Daughter called at end of session to update on progress and attempt to contact pt's friend to bring her glasses and phone to the hospital. Will follow acutely.    07/10/19 1200  OT Visit Information  Last OT Received On 07/10/19  Assistance Needed +1 (Can be done +1; 2nd person helpful for chair push)  History of Present Illness Jodi Frank is a 79 y.o. female with medical history significant of vascular dementia, paroxysmal A. 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  Precautions  Precautions Fall  Restrictions  Weight Bearing Restrictions No  LLE Weight Bearing WBAT  Other Position/Activity Restrictions WBAT per MD note on 2/13  Home Living  Family/patient expects to be discharged to: [place] Alone  Available Help at Discharge Friend(s)  Type of Home House (Townhome)  Home Access Stairs to enter  Entrance Stairs-Number of Steps 1  Entrance Stairs-Rails None  Home Layout Two level;Able to live on main level with bedroom/bathroom  Bathroom Shower/Tub Tub only;Walk-in Armed forces technical officer - single point  Prior Function  Level of Independence Independent with assistive device(s)  Comments Uses cane for community amb; I believe she is still driving, shopping, etc.   Communication  Communication No difficulties  Pain Assessment  Pain Assessment 0-10  Pain Score 3 (with sitting; 10 with standing; walking)  Pain Location L buttock and groin, with bed mobiltiy and with amb; 3/10 at rest in the chair post amb  Pain Descriptors / Indicators Grimacing;Crying  Pain Intervention(s) Limited activity within patient's tolerance  Cognition  Arousal/Alertness Awake/alert  Behavior During Therapy WFL for tasks assessed/performed  Overall Cognitive Status History of cognitive impairments - at baseline (for simple mobility tasks)  General Comments During session, she reported to MD she had no pain, but Nurse Tech reminded her of her pain earlier this morning; Able to inform staff of who her cardiologist is, and that she has an upcoming appointment; noted vascular dementia in her PMH  Upper Extremity Assessment  Upper Extremity Assessment Overall WFL for tasks assessed  Lower Extremity Assessment  Lower Extremity Assessment Defer to PT evaluation  LLE Deficits / Details able to perform ankle pumps and quad sets without difficulty; good initiation of heel slide, but with noted grimace; significant pain with abd/adduction movements, and weight bearing, which very much limits activity tolerance  LLE Unable to fully assess due to pain ( LLE more painful)  Cervical / Trunk Assessment  Cervical / Trunk Assessment Normal  ADL  Overall ADL's  Needs assistance/impaired  Grooming Set up;Sitting  Upper Body Bathing Set up;Sitting  Lower Body Bathing Moderate assistance;Sit to/from stand  Upper Body Dressing  Set up;Sitting  Lower Body Dressing Maximal assistance;Sit to/from Retail buyer Details (indicate cue type and reason) unable to complete with  RW  due to pain. Able to stand but difficulty with pivoting; will bene  Toileting- Clothing Manipulation and Hygiene Moderate assistance  Functional mobility during ADLs Rolling walker;Cueing for safety;Cueing for  sequencing;Moderate assistance (not able to pivot)  Vision- History  Baseline Vision/History Wears glasses (does not have them with her)  Wears Glasses At all times  Bed Mobility  Overal bed mobility Needs Assistance  Bed Mobility Supine to Sit;Sit to Supine  Supine to sit Mod assist (use of bed pad)  Sit to supine Max assist  Transfers  Overall transfer level Needs assistance  Equipment used Rolling walker (2 wheeled)  Transfers Sit to/from Stand  Sit to Stand Mod assist  General transfer comment unable to pivot with +1; would be good to use Stedy for toilet transfers  Balance  Overall balance assessment Needs assistance  Sitting-balance support Bilateral upper extremity supported  Sitting balance-Leahy Scale Fair  Standing balance-Leahy Scale Poor  OT - End of Session  Equipment Utilized During Treatment Gait belt;Rolling walker  Activity Tolerance Patient tolerated treatment well  Patient left in bed;with call bell/phone within reach;with bed alarm set  Nurse Communication Mobility status;Need for lift equipment Charlaine Dalton for [facility] transfer)  OT Assessment  OT Recommendation/Assessment Patient needs continued OT Services  OT Visit Diagnosis Other abnormalities of gait and mobility (R26.89);Muscle weakness (generalized) (M62.81);History of falling (Z91.81);Other symptoms and signs involving cognitive function;Pain  Pain - Right/Left Left  Pain - part of body Leg (back)  OT Problem List Decreased strength;Decreased range of motion;Decreased activity tolerance;Impaired balance (sitting and/or standing);Decreased coordination;Decreased cognition;Decreased safety awareness;Decreased knowledge of use of DME or AE;Pain  OT Plan  OT Frequency (ACUTE ONLY) Min 2X/week  OT Treatment/Interventions (ACUTE ONLY) Self-care/ADL training;Therapeutic exercise;DME and/or AE instruction;Therapeutic activities;Cognitive remediation/compensation;Patient/family education;Balance training  AM-PAC OT "6  Clicks" Daily Activity Outcome Measure (Version 2)  Help from another person eating meals? 4  Help from another person taking care of personal grooming? 3  Help from another person toileting, which includes using toliet, bedpan, or urinal? 2  Help from another person bathing (including washing, rinsing, drying)? 2  Help from another person to put on and taking off regular upper body clothing? 3  Help from another person to put on and taking off regular lower body clothing? 2  6 Click Score 16  OT Recommendation  Follow Up Recommendations SNF;Supervision/Assistance - 24 hour  OT Equipment 3 in 1 bedside commode  Individuals Consulted  Consulted and Agree with Results and Recommendations Patient;Family member/caregiver  Family Member Consulted daughter via phone  Acute Rehab OT Goals  Patient Stated Goal back to independence  OT Goal Formulation With patient  Time For Goal Achievement 07/24/19  Potential to Achieve Goals Good  OT Time Calculation  OT Start Time (ACUTE ONLY) 1237  OT Stop Time (ACUTE ONLY) 1312  OT Time Calculation (min) 35 min  OT General Charges  $OT Visit 1 Visit  OT Evaluation  $OT Eval Moderate Complexity 1 Mod  OT Treatments  $Self Care/Home Management  8-22 mins  Written Expression  Dominant Hand Right  Maurie Boettcher, OT/L   Acute OT Clinical Specialist [place] Pager [phone] Office [phone]

## 2019-07-10 NOTE — Progress Notes (Signed)
Physical Therapy Evaluation Patient Details Name: Jodi Frank MRN: [id number] DOB: [birth year] Today's Date: 07/10/2019   History of Present Illness  Jodi Frank is a 79 y.o. female with medical history significant of vascular dementia, paroxysmal A. 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  Clinical Impression   Pt admitted with above diagnosis. Comes from home where she was managing independently; Townhome with 2 levels, but bedroom and bathroom are on main level (no pressing need to go up stairs); Presents to PT with pain limiting transitional movements, functional dependencies; needed Max assist for bed mobility, and heavy mod assist for transfers;  States she does not have family local -- will need post-acute rehab to maximize independence and safety with mobility and ADLs;  Pt currently with functional limitations due to the deficits listed below (see PT Problem List). Pt will benefit from skilled PT to increase their independence and safety with mobility to allow discharge to the venue listed below.       Follow Up Recommendations SNF;Supervision/Assistance - 24 hour    Equipment Recommendations  Rolling walker with 5" wheels;3in1 (PT)    Recommendations for Other Services       Precautions / Restrictions Precautions Precautions: Fall Restrictions Weight Bearing Restrictions: No LLE Weight Bearing: Weight bearing as tolerated Other Position/Activity Restrictions: WBAT per MD note on 2/13      Mobility  Bed Mobility Overal bed mobility: Needs Assistance Bed Mobility: Supine to Sit     Supine to sit: Max assist;+2 for safety/equipment     General bed mobility comments: Good  half-bridge with RLE to move hips to EOB in prep for getting up; Max assist to help LLE off of the bed and elevate trunk to sit; very painful with transition movements  Transfers Overall transfer level: Needs assistance Equipment used: Rolling walker (2 wheeled) Transfers: Sit to/from Stand Sit to Stand: Mod assist         General transfer comment: Heavy mod (near Max) assist to power up; cues for hand placement and safety  Ambulation/Gait Ambulation/Gait assistance: Min assist;+2 safety/equipment(chair push) Gait Distance (Feet): 6 Feet Assistive device: Rolling walker (2 wheeled) Gait Pattern/deviations: Step-to pattern Gait velocity: extremely slow   General Gait Details: Step-by-step cues for sequence and to push down through RW to take pressure off of LLE in stance and allow for RLE advancement  Stairs            Wheelchair Mobility    Modified Rankin (Stroke Patients Only)       Balance Overall balance assessment: Needs assistance Sitting-balance support: Bilateral upper extremity supported Sitting balance-Leahy Scale: Fair       Standing balance-Leahy Scale: Poor                               Pertinent Vitals/Pain Pain Assessment: 0-10 Pain Score: 8  Pain Location: L buttock and groin, with bed mobiltiy and with amb; 3/10 at rest in the chair post amb Pain Descriptors / Indicators: Grimacing;Crying Pain Intervention(s): Monitored during session;Premedicated before session;Repositioned    Home Living Family/patient expects to be discharged to:: Private residence Living Arrangements: Alone Available Help  at Discharge: Friend(s) Type of Home: House(Townhome) Home Access: Stairs to enter Entrance Stairs-Rails: None Entrance Stairs-Number of Steps: 1 Home Layout: Two level;Able to live on main level with bedroom/bathroom Home Equipment: Cane - single point      Prior Function Level of Independence: Independent with assistive device(s)          Comments: Uses cane for community amb; I believe she is still driving, shopping, etc.     Hand Dominance        Extremity/Trunk Assessment   Upper Extremity Assessment Upper Extremity Assessment: Defer to OT evaluation    Lower Extremity Assessment Lower Extremity Assessment: LLE deficits/detail LLE Deficits / Details: able to perform ankle pumps and quad sets without difficulty; good initiation of heel slide, but with noted grimace; significant pain with abd/adduction movements, and weight bearing, which very much limits activity tolerance LLE: Unable to fully assess due to pain       Communication   Communication: No difficulties  Cognition Arousal/Alertness: Awake/alert Behavior During Therapy: WFL for tasks assessed/performed Overall Cognitive Status: Within Functional Limits for tasks assessed(for simple mobility tasks)                                 General Comments: During session, she reported to MD she had no pain, but Nurse Tech reminded her of her pain earlier this morning; Able to inform staff of who her cardiologist is, and that she has an upcoming appointment; noted vascular dementia in her [place]      General Comments      Exercises     Assessment/Plan    PT Assessment Patient needs continued PT services  PT Problem List Decreased strength;Decreased range of motion;Decreased activity tolerance;Decreased balance;Decreased mobility;Decreased knowledge of use of DME;Decreased safety awareness;Decreased knowledge of precautions;Pain       PT Treatment Interventions DME instruction;Gait training;Stair training;Functional mobility training;Therapeutic activities;Therapeutic exercise;Balance training;Cognitive remediation;Patient/family education    PT Goals (Current goals can be found in the Care Plan section)  Acute Rehab PT Goals Patient Stated Goal: back to independence PT Goal Formulation: With patient Time For Goal Achievement:  07/24/19 Potential to Achieve Goals: Good    Frequency Min 3X/week   Barriers to discharge Decreased caregiver support      Co-evaluation               AM-PAC PT "6 Clicks" Mobility  Outcome Measure Help needed turning from your back to your side while in a flat bed without using bedrails?: A Lot Help needed moving from lying on your back to sitting on the side of a flat bed without using bedrails?: A Lot Help needed moving to and from a bed to a chair (including a wheelchair)?: A Lot Help needed standing up from a chair using your arms (e.g., wheelchair or bedside chair)?: A Lot Help needed to walk in hospital room?: A Little Help needed climbing 3-5 steps with a railing? : Total 6 Click Score: 12    End of Session Equipment Utilized During Treatment: Gait belt Activity Tolerance: Patient limited by pain Patient left: in chair;with call bell/phone within reach;with chair alarm set Nurse Communication: Mobility status PT Visit Diagnosis: Other abnormalities of gait and mobility (R26.89);Pain;History of falling (Z91.81) Pain - Right/Left: Left Pain - part of body: Leg    Time: [id number] PT Time Calculation (min) (ACUTE ONLY): 28 min   Charges:   PT Evaluation $PT Eval Moderate  Complexity: 1 Mod PT Treatments $Gait Training: 8-22 mins        Roney [place], [place]  Acute Rehabilitation Services Pager [phone] Office [place] 07/10/2019, 12:00 PM

## 2019-07-11 LAB — URINE CULTURE: Culture: NO GROWTH

## 2019-07-11 LAB — CBC WITH DIFFERENTIAL/PLATELET
Abs Immature Granulocytes: 0.04 10*3/uL (ref 0.00–0.07)
Basophils Absolute: 0 10*3/uL (ref 0.0–0.1)
Basophils Relative: 0 %
Eosinophils Absolute: 0.1 10*3/uL (ref 0.0–0.5)
Eosinophils Relative: 1 %
HCT: 37.7 % (ref 36.0–46.0)
Hemoglobin: 12.6 g/dL (ref 12.0–15.0)
Immature Granulocytes: 0 %
Lymphocytes Relative: 7 %
Lymphs Abs: 0.7 10*3/uL (ref 0.7–4.0)
MCH: 28.1 pg (ref 26.0–34.0)
MCHC: 33.4 g/dL (ref 30.0–36.0)
MCV: 84 fL (ref 80.0–100.0)
Monocytes Absolute: 0.6 10*3/uL (ref 0.1–1.0)
Monocytes Relative: 6 %
Neutro Abs: 8.3 10*3/uL — ABNORMAL HIGH (ref 1.7–7.7)
Neutrophils Relative %: 86 %
Platelets: 239 10*3/uL (ref 150–400)
RBC: 4.49 MIL/uL (ref 3.87–5.11)
RDW: 14.3 % (ref 11.5–15.5)
WBC: 9.7 10*3/uL (ref 4.0–10.5)
nRBC: 0 % (ref 0.0–0.2)

## 2019-07-11 LAB — PROTIME-INR
INR: 3.3 — ABNORMAL HIGH (ref 0.8–1.2)
Prothrombin Time: 33.8 seconds — ABNORMAL HIGH (ref 11.4–15.2)

## 2019-07-11 MED ORDER — POLYETHYLENE GLYCOL 3350 17 G PO PACK
17.0000 g | PACK | Freq: Every day | ORAL | Status: DC
  Administered 2019-07-11 – 2019-07-13 (×3): 17 g via ORAL
  Filled 2019-07-11 (×4): qty 1

## 2019-07-11 MED ORDER — WARFARIN SODIUM 4 MG PO TABS
4.0000 mg | ORAL_TABLET | Freq: Once | ORAL | Status: AC
  Administered 2019-07-11: 4 mg via ORAL
  Filled 2019-07-11: qty 1

## 2019-07-11 NOTE — Progress Notes (Signed)
PROGRESS NOTE    Jodi Frank  [id number] DOB: April 03, 1941 DOA: 07/09/2019 PCP: [name], NP   Brief Narrative: Patient is a 79 year old female with history of vascular dementia, paroxysmal A. 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  She lives alone and ambulates with the help of walker.  She fell out of her bed and fall landing on her back.  She felt pain on her left buttock after the fall.  No loss of consciousness.  No head injury.  On presentation she was found to be A. fib with RVR, hypertension, and leukocytosis.  CK was elevated .    CT abdomen/pelvis showed left sacral alae and superior and inferior pubic rami fracture.  Case discussed with orthopedics and planned for conservative management.  PT/OT consulted and recommended skilled nursing facility.  Social worker consulted. Patient is hemodynamically stable for discharge to skilled nursing facility soon as bed is available.  Assessment & Plan:   Principal Problem:   Pelvic fracture (HCC) Active Problems:   Essential hypertension, benign   Long term (current) use of anticoagulants   H/O mitral valve replacement with mechanical valve   Mixed hyperlipidemia   Rhabdomyolysis   Atrial fibrillation with rapid ventricular response (HCC)   Pelvic fracture: Secondary to fall.  CT finding as above.  Discussed with orthopedics, plan for conservative management.  Continue pain management.  PT/OT evaluation done and recommended SN facility.Weightbearing as tolerated.  Continue bowel regimen  Rhabdomyolysis: Associated with fall.  Mildly  elevated CK level.    Paroxysmal A. fib with RVR: Heart rate in the range of 140s on presentation.  Rate has improved and she is currently in normal sinus rhythm..  Not on any AV nodal blocking agent at home.  On Coumadin for chronic anticoagulation.  Started on Toprol.  Supratherapeutic INR: INR elevated at 5 on presentation. Goal INR 2.5-3.5.InR is 3.3 today.  History of mechanical mitral valve replacement: On Coumadin.  Leukocytosis: Most likely reactive.  Chest x-ray did not show pneumonia.  Lactate level normal.  She is afebrile.  UA was impressive for UTI but she denies dysuria.  Continue to monitor the trend.  She was given a dose of ceftriaxone in the emergency department.  Urine culture did not show any growth  Elevated LFTs: Mild.  No right upper quadrant tenderness.  Right upper quadrant ultrasound was normal.  Elevated troponin: Mild with flat trend.  Most likely secondary to supply demand ischemia in the setting of A. fib with RVR.  No chest pain.  Vascular dementia: Continue supportive care.  She is on Aricept at home.  Currently alert and oriented  Hypertension: Monitor blood pressure.  Continue current regimen.  Hyperlipidemia: Continue Lipitor             DVT prophylaxis: Supratherapeutic INR Code Status: DNR Family Communication: Called daughter on phone on 07/10/19 Disposition Plan: Patient is from home.  PT/OT evaluation done and she is waiting for skilled facility bed.  Patient is hemodynamically stable for discharge as soon as bed is available..   Consultants: None  Procedures: None  Antimicrobials:  Anti-infectives (From admission, onward)   Start     Dose/Rate Route Frequency Ordered Stop   07/09/19 2115  cefTRIAXone (ROCEPHIN) 1 g in sodium chloride 0.9 % 100 mL IVPB     1 g 200 mL/hr over 30 Minutes Intravenous  Once 07/09/19 2113  07/10/19 0853      Subjective:  Patient seen and examined at the bedside this morning.  Hemodynamically stable.  Looks comfortable but complains of inability to move out of the bed.  No other complaints.  Objective: Vitals:   07/10/19 0813 07/10/19 1227 07/10/19 1933 07/11/19 0621  BP: (!) 141/53 131/63 136/67 130/62  Pulse: 98 100 88 (!) 101  Resp: 19 18 18  19   Temp: 98.9 F (37.2 C) 97.7 F (36.5 C) 98.2 F (36.8 C) 97.9 F (36.6 C)  TempSrc: Oral Oral Oral Oral  SpO2: 90% 94% 92% 94%    Intake/Output Summary (Last 24 hours) at 07/11/2019 0802 Last data filed at 07/11/2019 0300 Gross per 24 hour  Intake 240 ml  Output 700 ml  Net -460 ml   There were no vitals filed for this visit.  Examination:   General exam: Not in distress Respiratory system: Bilateral equal air entry, normal vesicular breath sounds, no wheezes or crackles  Cardiovascular system: S1 & S2 heard, RRR. No JVD, murmurs, rubs, gallops or clicks. Gastrointestinal system: Abdomen is nondistended, soft and nontender. No organomegaly or masses felt. Normal bowel sounds heard. Central nervous system: Alert and oriented. No focal neurological deficits. Extremities: No edema, no clubbing ,no cyanosis Skin: No rashes, lesions or ulcers,no icterus ,no pallor    Data Reviewed: I have personally reviewed following labs and imaging studies  CBC: Recent Labs  Lab 07/09/19 1649 07/10/19 0646  WBC 20.7* 14.1*  NEUTROABS 19.0*  --   HGB 14.4 12.5  HCT 44.1 38.2  MCV 84.8 84.7  PLT 263 [id number]   Basic Metabolic Panel: Recent Labs  Lab 07/09/19 1649 07/10/19 0646  NA 133* 137  K 4.6 3.7  CL 98 106  CO2 21* 19*  GLUCOSE 112* 97  BUN 15 13  CREATININE 0.92 0.81  CALCIUM 9.1 8.5*   GFR: Estimated Creatinine Clearance: 52.9 mL/min (by C-G formula based on SCr of 0.81 mg/dL). Liver Function Tests: Recent Labs  Lab 07/09/19 1649  AST 46*  ALT 28  ALKPHOS 56  BILITOT 3.1*  PROT 6.9  ALBUMIN 4.1   No results for input(s): LIPASE, AMYLASE in the last 168 hours. No results for input(s): AMMONIA in the last 168 hours. Coagulation Profile: Recent Labs  Lab 07/09/19 1649 07/10/19 0646  INR 5.1* 6.5*   Cardiac Enzymes: Recent Labs  Lab 07/09/19 1649 07/10/19 0646  CKTOTAL 835* 393*   BNP (last 3 results) No results for input(s): PROBNP in the last  8760 hours. HbA1C: No results for input(s): HGBA1C in the last 72 hours. CBG: No results for input(s): GLUCAP in the last 168 hours. Lipid Profile: No results for input(s): CHOL, HDL, LDLCALC, TRIG, CHOLHDL, LDLDIRECT in the last 72 hours. Thyroid Function Tests: No results for input(s): TSH, T4TOTAL, FREET4, T3FREE, THYROIDAB in the last 72 hours. Anemia Panel: No results for input(s): VITAMINB12, FOLATE, FERRITIN, TIBC, IRON, RETICCTPCT in the last 72 hours. Sepsis Labs: Recent Labs  Lab 07/09/19 1649 07/09/19 1945  LATICACIDVEN 1.5 1.4    Recent Results (from the past 240 hour(s))  Blood culture (routine x 2)     Status: None (Preliminary result)   Collection Time: 07/09/19  4:49 PM   Specimen: BLOOD  Result Value Ref Range Status   Specimen Description BLOOD SITE NOT SPECIFIED  Final   Special Requests   Final    BOTTLES DRAWN AEROBIC AND ANAEROBIC Blood Culture results may not be optimal due to an inadequate  volume of blood received in culture bottles   Culture   Final    NO GROWTH 2 DAYS Performed at [facility], [place] [address] [place], [place] [address]    Report Status PENDING  Incomplete  Blood culture (routine x 2)     Status: None (Preliminary result)   Collection Time: 07/09/19  6:57 PM   Specimen: BLOOD  Result Value Ref Range Status   Specimen Description BLOOD BLOOD RIGHT FOREARM  Final   Special Requests   Final    BOTTLES DRAWN AEROBIC AND ANAEROBIC Blood Culture adequate volume   Culture   Final    NO GROWTH 2 DAYS Performed at [facility], [place] [address], [place], [place] [address]    Report Status PENDING  Incomplete  Respiratory Panel by RT PCR (Flu A&B, Covid) - Nasopharyngeal Swab     Status: None   Collection Time: 07/09/19  8:30 PM   Specimen: Nasopharyngeal Swab  Result Value Ref Range Status   SARS Coronavirus 2 by RT PCR NEGATIVE NEGATIVE Final    Comment: (NOTE) SARS-CoV-2 target nucleic acids are NOT DETECTED. The  SARS-CoV-2 RNA is generally detectable in upper respiratoy specimens during the acute phase of infection. The lowest concentration of SARS-CoV-2 viral copies this assay can detect is 131 copies/mL. A negative result does not preclude SARS-Cov-2 infection and should not be used as the sole basis for treatment or other patient management decisions. A negative result may occur with  improper specimen collection/handling, submission of specimen other than nasopharyngeal swab, presence of viral mutation(s) within the areas targeted by this assay, and inadequate number of viral copies (<131 copies/mL). A negative result must be combined with clinical observations, patient history, and epidemiological information. The expected result is Negative. Fact Sheet for Patients:  PinkCheek.be Fact Sheet for Healthcare Providers:  GravelBags.it This test is not yet ap proved or cleared by the [place] FDA and  has been authorized for detection and/or diagnosis of SARS-CoV-2 by FDA under an Emergency Use Authorization (EUA). This EUA will remain  in effect (meaning this test can be used) for the duration of the COVID-19 declaration under Section 564(b)(1) of the Act, 21 U.S.C. section 360bbb-3(b)(1), unless the authorization is terminated or revoked sooner.    Influenza A by PCR NEGATIVE NEGATIVE Final   Influenza B by PCR NEGATIVE NEGATIVE Final    Comment: (NOTE) The Xpert Xpress SARS-CoV-2/FLU/RSV assay is intended as an aid in  the diagnosis of influenza from Nasopharyngeal swab specimens and  should not be used as a sole basis for treatment. Nasal washings and  aspirates are unacceptable for Xpert Xpress SARS-CoV-2/FLU/RSV  testing. Fact Sheet for Patients: PinkCheek.be Fact Sheet for Healthcare Providers: GravelBags.it This test is not yet approved or cleared by the [place] FDA and  has been authorized for detection and/or diagnosis of SARS-CoV-2 by  FDA under an Emergency Use Authorization (EUA). This EUA will remain  in effect (meaning this test can be used) for the duration of the  Covid-19 declaration under Section 564(b)(1) of the Act, 21  U.S.C. section 360bbb-3(b)(1), unless the authorization is  terminated or revoked. Performed at [facility], [place] [address], [place], [place] [address]   Urine Culture     Status: None   Collection Time: 07/10/19  6:46 AM   Specimen: Urine, Catheterized  Result Value Ref Range Status   Specimen Description URINE, CATHETERIZED  Final   Special Requests NONE  Final   Culture   Final    NO GROWTH Performed at [facility], [place] [address], [place], [place] [address]    Report Status 07/11/2019 FINAL  Final         Radiology Studies: CT ABDOMEN PELVIS WO CONTRAST  Result Date: 07/09/2019 CLINICAL DATA:  Recent fall with back and buttock pain EXAM: CT ABDOMEN AND PELVIS WITHOUT CONTRAST TECHNIQUE: Multidetector CT imaging of the abdomen and pelvis was performed following the standard protocol without IV contrast. COMPARISON:  07/12/2017 FINDINGS: Lower chest: No acute abnormality. Hepatobiliary: No focal liver abnormality is seen. No gallstones, gallbladder wall thickening, or biliary dilatation. Pancreas: Unremarkable. No pancreatic ductal dilatation or surrounding inflammatory changes. Spleen: Normal in size without focal abnormality. Adrenals/Urinary Tract: Adrenal glands are within normal limits. Kidneys demonstrate a normal appearance without renal calculi or obstructive change. The bladder is well distended. Stomach/Bowel: The appendix is within normal limits. No obstructive or inflammatory changes of the colon are seen. The small bowel is within normal limits. Stomach is unremarkable. Vascular/Lymphatic: Aortic atherosclerosis. No enlarged abdominal or pelvic lymph nodes. 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 Reproductive: Uterine calcifications are seen stable from the prior exam. No adnexal mass is noted. Other: No abdominal wall hernia or abnormality. No abdominopelvic ascites. Musculoskeletal: Postsurgical changes are noted in the proximal left femur. Left superior and inferior pubic rami fractures are noted. Left sacral ala fractures are noted as well. Degenerative changes of the lumbar spine are seen. IMPRESSION: Left sacral ala and superior and inferior pubic rami fractures. Chronic changes as described above. Electronically Signed   By: Inez Catalina M.D.   On: 07/09/2019 19:27   DG Chest 1 View  Result Date: 07/09/2019 CLINICAL DATA:  Found down. EXAM: CHEST  1 VIEW COMPARISON:  Chest x-ray dated 05/04/2017. FINDINGS: Borderline cardiomegaly. Coarse lung markings bilaterally suggesting some degree of chronic interstitial lung disease. No confluent opacity to suggest pneumonia or pulmonary edema. No pleural effusion or pneumothorax is seen. Osseous structures about the chest are unremarkable. Median sternotomy wires appear intact and stable in alignment. IMPRESSION: 1. No acute findings. No evidence of pneumonia or pulmonary edema. 2. 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 3. Borderline cardiomegaly. Electronically Signed   By: Franki Cabot M.D.   On: 07/09/2019 18:05   DG Pelvis 1-2 Views  Result Date: 07/09/2019 CLINICAL DATA:  Found down. EXAM: PELVIS - 1-2 VIEW COMPARISON:  None. FINDINGS: Single-view of the pelvis. Osseous structures appear normally aligned. No evidence of acute fracture or dislocation. Two screws appear appropriately positioned within the LEFT femoral neck. Soft tissues about the pelvis are unremarkable. IMPRESSION: No acute findings. Electronically Signed   By: Franki Cabot M.D.   On: 07/09/2019 18:06   CT Head Wo Contrast  Result Date: 07/09/2019 CLINICAL DATA:  Headache, Coumadin, found on floor EXAM: CT HEAD WITHOUT CONTRAST CT CERVICAL SPINE WITHOUT  CONTRAST TECHNIQUE: Multidetector CT imaging of the head and cervical spine was performed following the standard protocol without intravenous contrast. Multiplanar CT image reconstructions of the cervical spine were also generated. COMPARISON:  CT 05/06/2019, 05/04/2017 FINDINGS: CT HEAD FINDINGS Brain: No evidence of acute infarction, hemorrhage, hydrocephalus, extra-axial collection or mass lesion/mass effect. Patchy hypodensity in the white matter consistent with chronic small vessel ischemic change. 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 Atrophy Vascular: No hyperdense vessels.  Carotid vascular calcification Skull: Normal. Negative for fracture or focal lesion. Sinuses/Orbits: No acute finding. Mild mucosal thickening in the  sinuses Other: None CT CERVICAL SPINE FINDINGS Alignment: Straightening of the cervical spine. Trace retrolisthesis C3 on C4 without change. Facet alignment is maintained Skull base and vertebrae: No acute fracture. No primary bone lesion or focal pathologic process. Soft tissues and spinal canal: No prevertebral fluid or swelling. No visible canal hematoma. Disc levels: Moderate diffuse degenerative change C3 through C7. Chronic superior endplate deformity at T1. Posterior disc osteophyte at C4-C5 results in mild canal narrowing. Facet degenerative change at multiple levels. Multiple level foraminal stenosis. Upper chest: Negative. Other: None IMPRESSION: 1. No CT evidence for acute intracranial abnormality. Atrophy and chronic small vessel ischemic changes of the white matter. 2. Degenerative changes of the cervical spine. No acute osseous abnormality. Electronically Signed   By: Donavan Foil M.D.   On: 07/09/2019 18:25   CT Cervical Spine Wo Contrast  Result Date: 07/09/2019 CLINICAL DATA:  Headache, Coumadin, found on floor EXAM: CT HEAD WITHOUT CONTRAST CT CERVICAL SPINE WITHOUT CONTRAST TECHNIQUE: Multidetector CT imaging of the head and cervical spine was performed  following the standard protocol without intravenous contrast. Multiplanar CT image reconstructions of the cervical spine were also generated. COMPARISON:  CT 05/06/2019, 05/04/2017 FINDINGS: CT HEAD FINDINGS Brain: No evidence of acute infarction, hemorrhage, hydrocephalus, extra-axial collection or mass lesion/mass effect. Patchy hypodensity in the white matter consistent with chronic small vessel ischemic change. 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 Atrophy Vascular: No hyperdense vessels.  Carotid vascular calcification Skull: Normal. Negative for fracture or focal lesion. Sinuses/Orbits: No acute finding. Mild mucosal thickening in the sinuses Other: None CT CERVICAL SPINE FINDINGS Alignment: Straightening of the cervical spine. Trace retrolisthesis C3 on C4 without change. Facet alignment is maintained Skull base and vertebrae: No acute fracture. No primary bone lesion or focal pathologic process. Soft tissues and spinal canal: No prevertebral fluid or swelling. No visible canal hematoma. Disc levels: Moderate diffuse degenerative change C3 through C7. Chronic superior endplate deformity at T1. Posterior disc osteophyte at C4-C5 results in mild canal narrowing. Facet degenerative change at multiple levels. Multiple level foraminal stenosis. Upper chest: Negative. Other: None IMPRESSION: 1. No CT evidence for acute intracranial abnormality. Atrophy and chronic small vessel ischemic changes of the white matter. 2. Degenerative changes of the cervical spine. No acute osseous abnormality. Electronically Signed   By: Donavan Foil M.D.   On: 07/09/2019 18:25   DG FEMUR MIN 2 VIEWS LEFT  Result Date: 07/09/2019 CLINICAL DATA:  Found down. EXAM: LEFT FEMUR 2 VIEWS COMPARISON:  None. FINDINGS: No evidence of acute osseous fracture or dislocation. Fixation screws are present at the LEFT femoral neck. The screws appear intact and appropriately positioned. Adjacent soft tissues are unremarkable.  IMPRESSION: No acute findings. Electronically Signed   By: Franki Cabot M.D.   On: 07/09/2019 18:03   US Abdomen Limited RUQ  Result Date: 07/10/2019 CLINICAL DATA:  Elevated LFTs. EXAM: ULTRASOUND ABDOMEN LIMITED RIGHT UPPER QUADRANT COMPARISON:  CT abdomen and pelvis 07/09/2019 FINDINGS: Gallbladder: No gallstones or wall thickening visualized. No sonographic Murphy sign noted by sonographer. Common bile duct: Diameter: 4.1 mm Liver: No focal lesion identified. Within normal limits in parenchymal echogenicity. Portal vein is patent on color Doppler imaging with normal direction of blood flow towards the liver. Other: None. IMPRESSION: 1. Normal exam. Electronically Signed   By: Kerby Moors M.D.   On: 07/10/2019 06:23        Scheduled Meds: . alendronate  70 mg Oral Q Sun  . donepezil  5  mg Oral QHS  . folic acid  1 mg Oral Daily  . hydrALAZINE  50 mg Oral TID  . metoprolol succinate  12.5 mg Oral Daily  . senna  1 tablet Oral QHS  . vitamin B-12  1,000 mcg Oral Daily  . Warfarin - Pharmacist Dosing Inpatient   Does not apply q1800   Continuous Infusions:    LOS: 2 days    Time spent: 35 mins.More than 50% of that time was spent in counseling and/or coordination of care.      Shelly Coss, MD [facility] P2/15/2021, 8:02 AM

## 2019-07-11 NOTE — Progress Notes (Signed)
ANTICOAGULATION CONSULT NOTE - Follow Up Consult  Pharmacy Consult for Warfarin Indication: MVR and afib  Allergies  Allergen Reactions  . Carvedilol Itching  . Ace Inhibitors Other (See Comments)    Reaction (??)  . Amlodipine Swelling  . Hydrochlorothiazide Other (See Comments)    Hyponatremia   . Metoprolol Other (See Comments)    Dizziness     Patient Measurements: Height: 5\' 3"  (160 cm) Weight: 144 lb (65.3 kg) IBW/kg (Calculated) : 52.4  Vital Signs: Temp: 97.7 F (36.5 C) (02/15 0813) Temp Source: Oral (02/15 0813) BP: 157/80 (02/15 0813) Pulse Rate: 105 (02/15 0813)  Labs: Recent Labs    07/09/19 1649 07/09/19 1649 07/09/19 1945 07/10/19 0646 07/11/19 1026  HGB 14.4   < >  --  12.5 12.6  HCT 44.1  --   --  38.2 37.7  PLT 263  --   --  208 239  LABPROT 47.3*  --   --  57.3* 33.8*  INR 5.1*  --   --  6.5* 3.3*  CREATININE 0.92  --   --  0.81  --   CKTOTAL 835*  --   --  393*  --   TROPONINIHS 53*  --  77* 76*  --    < > = values in this interval not displayed.    Estimated Creatinine Clearance: 52.9 mL/min (by C-G formula based on SCr of 0.81 mg/dL).  Assessment:  Patient is a 49 yof that in on warfarin for Afib and hx of a MVR. Patient presents after a fall; patient was found a day and half after fall. PTA her dose was Warfarin 4mg  daily except Tues 2mg . INR on admission 2/13 was 5.1. Last INR in clinic on 2/5 was 2.3.     Warfarin held on 2/13 (INR 5.1) and 2/14 (INR 6.5).   INR down to 3.3 today and likely to trend down again tomorrow.   No bleeding reported.  Goal of Therapy:  INR 2.5-3.5 Monitor platelets by anticoagulation protocol: Yes   Plan:   Warfarin 4 mg x 1 today.  Daily PT/INR.  Arty Baumgartner, [place] Phone: [phone] 07/11/2019,1:35 PM

## 2019-07-11 NOTE — TOC Initial Note (Signed)
Transition of Care [facility]) - Initial/Assessment Note    Patient Details  Name: Jodi Frank MRN: [id number] Date of Birth: 12-Jul-1940  [facility]) CM/SW Contact:    [name], RN Phone Number: [phone] 07/11/2019, 11:00 AM  Clinical Narrative:                 Acknowledging consult for SNF placement. Patient having increased confusion and oriented to self. Telephone call to patient's daughter, Jodi Frank, to discuss plans for rehab after hospital. Discussed SNF process and encouraged to research local SNFs on medicare.gov website. Jodi agreed to [facility] faxing out patient information. Will follow up with Jodi for bed offers. Jodi also provided her gmail address - [email or web address].   Jodi stated that she is currently in living in [place], but also has a home in [place]. Patient has a friend who checks in on her, and will be bringing patient's glasses to the hospital.   [facility] team following for transition needs.   Expected Discharge Plan: Skilled Nursing Facility Barriers to Discharge: SNF Pending bed offer, Continued Medical Work up   Patient Goals and CMS Choice Patient states their goals for this hospitalization and ongoing recovery are:: rehab CMS Medicare.gov Compare Post Acute Care list provided to:: Patient Represenative (must comment) Choice offered to / list presented to : Adult Children(Jodi Frank)  Expected Discharge Plan and Services Expected Discharge Plan: [place] In-house Referral: Clinical Social Work Discharge Planning Services: CM Consult Post Acute Care Choice: [place] arrangements for the past 2 months: Single Family Home                 DME Arranged: N/A DME Agency: NA       HH Arranged: NA [place] Agency: NA        Prior Living Arrangements/Services Living arrangements for the past 2 months: [place] Lives with:: Self              Current home services: DME    Activities of Daily  Living Home Assistive Devices/Equipment: Cane (specify quad or straight) ADL Screening (condition at time of admission) Patient's cognitive ability adequate to safely complete daily activities?: Yes Is the patient deaf or have difficulty hearing?: No Does the patient have difficulty seeing, even when wearing glasses/contacts?: No Does the patient have difficulty concentrating, remembering, or making decisions?: Yes Patient able to express need for assistance with ADLs?: Yes Does the patient have difficulty dressing or bathing?: Yes Independently performs ADLs?: Yes (appropriate for developmental age) Does the patient have difficulty walking or climbing stairs?: Yes Weakness of Legs: Left Weakness of Arms/Hands: None  Permission Sought/Granted                  Emotional Assessment       Orientation: : Oriented to Self Alcohol / Substance Use: Not Applicable Psych Involvement: No (comment)  Admission diagnosis:  Rhabdomyolysis [M62.82] Fall [W19.XXXA] Elevated LFTs [R79.89] Patient Active Problem List   Diagnosis Date Noted  . Rhabdomyolysis 07/09/2019  . Pelvic fracture ([place]) 07/09/2019  . Atrial fibrillation with rapid ventricular response ([place]) 07/09/2019  . Closed fracture of pubic ramus ([place])   . Unsteady gait 08/21/2017  . 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 Right sided sciatica 07/12/2017  . Supratherapeutic INR 07/12/2017  . Acute urinary retention 07/12/2017  . Leukocytosis 07/12/2017  . Sciatica 07/12/2017  . Anemia 05/15/2017  . Paroxysmal atrial fibrillation ([place]) 05/15/2017  . Abdominal aortic atherosclerosis ([place])  05/06/2017  . Mild cognitive impairment 05/06/2017  . H/O mitral valve replacement with mechanical valve 05/06/2017  . Scalp laceration 05/05/2017  . Acute encephalopathy 05/05/2017  . Fall 05/04/2017  . Intolerance of drug 07/03/2016  . Memory difficulty 01/18/2016  . Vertigo 01/18/2016  . Mixed hyperlipidemia 07/12/2014   . Thiazide diuretic adverse reaction 01/03/2014  . Osteoporosis 01/14/2013  . External hemorrhoids 12/24/2012  . Hip fracture, left S/P cannulated hip pinning 11/10/2012  . Insomnia 11/10/2012  . Muscle spasm 11/10/2012  . Essential hypertension, benign 11/10/2012  . Constipation 11/10/2012  . Long term (current) use of anticoagulants 11/10/2012  . Femoral neck fracture ([place]) 11/10/2012  . Acute blood loss anemia 11/05/2012  . Overactive bladder 09/05/2011  . Rosacea 09/04/2011  . Renal artery stenosis ([place]) 06/17/2011  . DIARRHEA 01/30/2010  . DEPRESSION 12/08/2006  . Allergic rhinitis 12/08/2006   PCP:  [name], NP Pharmacy:   [facility] [address] [place] - [address] [place] [address] Phone: [phone] Fax: [phone]     Social Determinants of Health (SDOH) Interventions    Readmission Risk Interventions No flowsheet data found.

## 2019-07-11 NOTE — Progress Notes (Signed)
Physical Therapy Treatment Patient Details Name: Jodi Frank MRN: [id number] DOB: 1940/09/22 Today's Date: 07/11/2019    History of Present Illness Jodi Frank is a 79 y.o. female with medical history significant of vascular dementia, paroxysmal A. 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    PT Comments    Continuing work on functional mobility and activity tolerance;  Seen for second session to work on functional transfer back to bed; Good use of RW for support during stand pivot transfer towards Ms. Crapps's less painful R side; Better tolerance of movement and transfers after getting medicated for pain   Follow Up Recommendations  SNF;Supervision/Assistance - 24 hour     Equipment Recommendations  Rolling walker with 5" wheels;3in1 (PT)    Recommendations for Other Services       Precautions / Restrictions Precautions Precautions: Fall Restrictions LLE Weight Bearing: Weight bearing as tolerated Other Position/Activity Restrictions: WBAT per MD note on 2/13    Mobility  Bed Mobility Overal bed mobility: Needs Assistance Bed Mobility: Sit to Supine     Supine to sit: Mod assist Sit to supine: Min assist   General bed mobility comments: Min assist to help LEs back into bed  Transfers Overall transfer level: Needs assistance Equipment used: Rolling walker (2 wheeled) Transfers: Sit to/from [facility] Sit to Stand: Mod assist Stand pivot transfers: Mod assist       General transfer comment: Heavy mod assist to power up to stand; cues to stand mainly on less painful R LE; good "heel-toe pivot" on RLE towards bed (on her right side) with bil UE support from  RW  Ambulation/Gait Ambulation/Gait assistance: Mod assist Gait Distance (Feet): 3 Feet(including pivot steps to recliner) Assistive device: Rolling walker (2 wheeled) Gait Pattern/deviations: Step-to pattern     General Gait Details: Step-by-step cues for sequence and to push down through RW to take pressure off of LLE in stance and allow for RLE advancement; more painful today   Stairs             Wheelchair Mobility    Modified Rankin (Stroke Patients Only)       Balance     Sitting balance-Leahy Scale: Fair       Standing balance-Leahy Scale: Poor                              Cognition Arousal/Alertness: Awake/alert Behavior During Therapy: WFL for tasks assessed/performed Overall Cognitive Status: Impaired/Different from baseline Area of Impairment: Memory                     Memory: Decreased short-term memory(with history of dementia)         General Comments: Noting more repeated questions today      Exercises      General Comments        Pertinent Vitals/Pain Pain Assessment: Faces Faces Pain Scale: Hurts even more Pain Location: L buttock and groin, with bed mobiltiy and with amb Pain Descriptors / Indicators: Grimacing;Crying Pain Intervention(s): Premedicated before session;Repositioned    Home Living  Prior Function            PT Goals (current goals can now be found in the care plan section) Acute Rehab PT Goals Patient Stated Goal: back to independence PT Goal Formulation: With patient Time For Goal Achievement: 07/24/19 Potential to Achieve Goals: Good Progress towards PT goals: Progressing toward goals    Frequency    Min 3X/week      PT Plan Current plan remains appropriate    Co-evaluation              AM-PAC PT "6 Clicks" Mobility   Outcome Measure  Help needed turning from your back to your side while in a flat bed without using bedrails?: A  Lot Help needed moving from lying on your back to sitting on the side of a flat bed without using bedrails?: A Lot Help needed moving to and from a bed to a chair (including a wheelchair)?: A Lot Help needed standing up from a chair using your arms (e.g., wheelchair or bedside chair)?: A Lot Help needed to walk in hospital room?: A Lot Help needed climbing 3-5 steps with a railing? : Total 6 Click Score: 11    End of Session Equipment Utilized During Treatment: Gait belt Activity Tolerance: Patient tolerated treatment well Patient left: with call bell/phone within reach;in bed;with bed alarm set Nurse Communication: Mobility status PT Visit Diagnosis: Other abnormalities of gait and mobility (R26.89);Pain;History of falling (Z91.81) Pain - Right/Left: Left Pain - part of body: Leg     Time: [id number] PT Time Calculation (min) (ACUTE ONLY): 10 min  Charges:  $Therapeutic Activity: 8-22 mins                     Roney Marion, PT  Acute Rehabilitation Services Pager [phone] Office [place] 07/11/2019, 5:17 PM

## 2019-07-11 NOTE — NC FL2 (Signed)
[place] LEVEL OF CARE SCREENING TOOL     IDENTIFICATION  Patient Name: Jodi Frank Birthdate: 09/05/1940 Sex: female Admission Date (Current Location): 07/09/2019  [facility] and [place] Number:  Herbalist and Address:  [facility], [place] [address] [place], [place] [address]      Provider Number: [id number]  Attending Physician Name and Address:  Shelly Coss, MD  Relative Name and Phone Number:  Jenniferanne Reda (daughter) [phone]    Current Level of Care: Hospital Recommended Level of Care: [place] Prior Approval Number:    Date Approved/Denied: 11/04/12 PASRR Number: [id number]  Discharge Plan: SNF    Current Diagnoses: Patient Active Problem List   Diagnosis Date Noted  . Rhabdomyolysis 07/09/2019  . Pelvic fracture ([place]) 07/09/2019  . Atrial fibrillation with rapid ventricular response ([place]) 07/09/2019  . Closed fracture of pubic ramus ([place])   . Unsteady gait 08/21/2017  . 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 Right sided sciatica 07/12/2017  . Supratherapeutic INR 07/12/2017  . Acute urinary retention 07/12/2017  . Leukocytosis 07/12/2017  . Sciatica 07/12/2017  . Anemia 05/15/2017  . Paroxysmal atrial fibrillation ([place]) 05/15/2017  . Abdominal aortic atherosclerosis ([place]) 05/06/2017  . Mild cognitive impairment 05/06/2017  . H/O mitral valve replacement with mechanical valve 05/06/2017  . Scalp laceration 05/05/2017  . Acute encephalopathy 05/05/2017  . Fall 05/04/2017  . Intolerance of drug 07/03/2016  . Memory difficulty 01/18/2016  . Vertigo 01/18/2016  . Mixed hyperlipidemia 07/12/2014  . Thiazide diuretic adverse reaction 01/03/2014  . Osteoporosis 01/14/2013  . External hemorrhoids 12/24/2012  . Hip fracture, left S/P cannulated hip pinning 11/10/2012  . Insomnia 11/10/2012  . Muscle spasm 11/10/2012  . Essential hypertension, benign 11/10/2012  . Constipation  11/10/2012  . Long term (current) use of anticoagulants 11/10/2012  . Femoral neck fracture ([place]) 11/10/2012  . Acute blood loss anemia 11/05/2012  . Overactive bladder 09/05/2011  . Rosacea 09/04/2011  . Renal artery stenosis ([place]) 06/17/2011  . DIARRHEA 01/30/2010  . DEPRESSION 12/08/2006  . Allergic rhinitis 12/08/2006    Orientation RESPIRATION BLADDER Height & Weight     Self  Normal Continent Weight:   Height:     BEHAVIORAL SYMPTOMS/MOOD NEUROLOGICAL BOWEL NUTRITION STATUS  Other (Comment)(vascular dementia)   Continent Diet  AMBULATORY STATUS COMMUNICATION OF NEEDS Skin   Extensive Assist Verbally Bruising(arms, legs, lower back/buttocks)                       Personal Care Assistance Level of Assistance  Bathing, Dressing, Feeding Bathing Assistance: Limited assistance Feeding assistance: Limited assistance Dressing Assistance: Limited assistance     Functional Limitations Info  Sight, Speech, Hearing Sight Info: Impaired(wears glasses) Hearing Info: Adequate Speech Info: Adequate    SPECIAL CARE FACTORS FREQUENCY  PT (By licensed PT), OT (By licensed OT)     PT Frequency: PT at SNF to eval and treat a min of 5x/week OT Frequency: OT at SNF to eval and treat a min of 5x/week            Contractures Contractures Info: Not present    Additional Factors Info  Code Status, Allergies Code Status Info: DNR Allergies Info: carveildilol, ace inhibitoris, amlodipine, hydrochlorothiazide, metroprolol           Current Medications (07/11/2019):  This is the current hospital active medication list Current Facility-Administered Medications  Medication Dose Route Frequency Provider Last Rate Last Admin  .  acetaminophen (TYLENOL) tablet 650 mg  650 mg Oral Q6H PRN Shela Leff, MD   650 mg at 07/10/19 1057   Or  . acetaminophen (TYLENOL) suppository 650 mg  650 mg Rectal Q6H PRN Shela Leff, MD      . alendronate (FOSAMAX) tablet 70 mg  70 mg  Oral Q Adela Ports, Wandra Feinstein, MD   70 mg at 07/10/19 0846  . donepezil (ARICEPT) tablet 5 mg  5 mg Oral QHS Shela Leff, MD   5 mg at 07/10/19 2214  . folic acid (FOLVITE) tablet 1 mg  1 mg Oral Daily Shela Leff, MD   1 mg at 07/11/19 0917  . hydrALAZINE (APRESOLINE) tablet 50 mg  50 mg Oral TID Shela Leff, MD   50 mg at 07/11/19 0916  . HYDROcodone-acetaminophen (NORCO/VICODIN) 5-325 MG per tablet 1-2 tablet  1-2 tablet Oral Q6H PRN Shela Leff, MD   1 tablet at 07/11/19 0915  . metoprolol succinate (TOPROL-XL) 24 hr tablet 12.5 mg  12.5 mg Oral Daily Shelly Coss, MD   12.5 mg at 07/11/19 0917  . metoprolol tartrate (LOPRESSOR) injection 5 mg  5 mg Intravenous Q5 min PRN Shela Leff, MD      . morphine 2 MG/ML injection 0.5 mg  0.5 mg Intravenous Q2H PRN Shela Leff, MD      . senna (SENOKOT) tablet 8.6 mg  1 tablet Oral QHS Shela Leff, MD   8.6 mg at 07/10/19 2214  . vitamin B-12 (CYANOCOBALAMIN) tablet 1,000 mcg  1,000 mcg Oral Daily Shela Leff, MD   1,000 mcg at 07/11/19 0917  . Warfarin - Pharmacist Dosing Inpatient   Does not apply q1800 Gillian Scarce [facility]   Given at 07/10/19 1652     Discharge Medications: Please see discharge summary for a list of discharge medications.  Relevant Imaging Results:  Relevant Lab Results:   Additional Information [id number]  [name], RN

## 2019-07-11 NOTE — TOC Progression Note (Signed)
Transition of Care [facility]) - Progression Note    Patient Details  Name: Jodi Frank MRN: [id number] Date of Birth: [birth year]  Transition of Care [facility]) CM/SW Contact  [name], RN Phone Number: [phone] 07/11/2019, 5:29 PM  Clinical Narrative:    Spoke with patient's daughter, [name], on the phone to discuss updated bed offers. Daughter is researching the facilities, but has not made a decision. TOC following for transition needs.    Expected Discharge Plan: [place] Barriers to Discharge: SNF Pending bed offer, Continued Medical Work up  Expected Discharge Plan and Services Expected Discharge Plan: [place] In-house Referral: Clinical Social Work Discharge Planning Services: CM Consult Post Acute Care Choice: [place] arrangements for the past 2 months: Single Family Home                 DME Arranged: N/A DME Agency: NA       HH Arranged: NA HH Agency: NA         Social Determinants of Health (SDOH) Interventions    Readmission Risk Interventions No flowsheet data found.

## 2019-07-11 NOTE — Progress Notes (Signed)
Physical Therapy Treatment Patient Details Name: Jodi Frank MRN: [id number] DOB: Jun 29, 1940 Today's Date: 07/11/2019    History of Present Illness Jodi Frank is a 79 y.o. female with medical history significant of vascular dementia, paroxysmal A. 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    PT Comments    Continuing work on functional mobility and activity tolerance;  Ms. Neifert had pressed the call bell, and was asking for assistance with walking when I arrived; Bed mobility was smoother, but still painful; She had not had pain meds, and pain limited her LLE weight bearing, making R foot advancement very difficult; Spoke with her nurse, Louie Casa, and he brought pain meds at end of session   Follow Up Recommendations  SNF;Supervision/Assistance - 24 hour     Equipment Recommendations  Rolling walker with 5" wheels;3in1 (PT)    Recommendations for Other Services       Precautions / Restrictions Precautions Precautions: Fall Restrictions LLE Weight Bearing: Weight bearing as tolerated Other Position/Activity Restrictions: WBAT per MD note on 2/13    Mobility  Bed Mobility Overal bed mobility: Needs Assistance Bed Mobility: Supine to Sit     Supine to sit: Mod assist     General bed mobility comments: Good half-bridge with RLE to move hips to EOB in prep for getting up; Heavy mod assist to help LLE off of the bed and elevate trunk to sit; very painful with transition movements  Transfers Overall transfer level: Needs assistance Equipment used: Rolling walker (2 wheeled) Transfers: Sit to/from Stand Sit to Stand: Mod assist         General transfer comment: Heavy mod assist to power up  to stand; cues to stand mainly on less painful R LE  Ambulation/Gait Ambulation/Gait assistance: Mod assist Gait Distance (Feet): 3 Feet(including pivot steps to recliner) Assistive device: Rolling walker (2 wheeled) Gait Pattern/deviations: Step-to pattern     General Gait Details: Step-by-step cues for sequence and to push down through RW to take pressure off of LLE in stance and allow for RLE advancement; more painful today   Stairs             Wheelchair Mobility    Modified Rankin (Stroke Patients Only)       Balance     Sitting balance-Leahy Scale: Fair       Standing balance-Leahy Scale: Poor                              Cognition Arousal/Alertness: Awake/alert Behavior During Therapy: WFL for tasks assessed/performed Overall Cognitive Status: Impaired/Different from baseline Area of Impairment: Memory                     Memory: Decreased short-term memory(with history of dementia)         General Comments: Noting more repeated questions today      Exercises      General Comments        Pertinent Vitals/Pain Pain Assessment: Faces Faces Pain Scale: Hurts even more Pain Location: L buttock and groin, with bed mobiltiy and with amb Pain Descriptors / Indicators: Grimacing;Crying Pain Intervention(s): Patient  requesting pain meds-RN notified    Home Living                      Prior Function            PT Goals (current goals can now be found in the care plan section) Acute Rehab PT Goals Patient Stated Goal: back to independence PT Goal Formulation: With patient Time For Goal Achievement: 07/24/19 Potential to Achieve Goals: Good Progress towards PT goals: Progressing toward goals(Slowly)    Frequency    Min 3X/week      PT Plan Current plan remains appropriate    Co-evaluation              AM-PAC PT "6 Clicks" Mobility   Outcome Measure  Help needed turning from your back to your  side while in a flat bed without using bedrails?: A Lot Help needed moving from lying on your back to sitting on the side of a flat bed without using bedrails?: A Lot Help needed moving to and from a bed to a chair (including a wheelchair)?: A Lot Help needed standing up from a chair using your arms (e.g., wheelchair or bedside chair)?: A Lot Help needed to walk in hospital room?: A Lot Help needed climbing 3-5 steps with a railing? : Total 6 Click Score: 11    End of Session Equipment Utilized During Treatment: Gait belt Activity Tolerance: Patient limited by pain Patient left: in chair;with call bell/phone within reach;with chair alarm set Nurse Communication: Mobility status PT Visit Diagnosis: Other abnormalities of gait and mobility (R26.89);Pain;History of falling (Z91.81) Pain - Right/Left: Left Pain - part of body: Leg     Time: [id number] PT Time Calculation (min) (ACUTE ONLY): 19 min  Charges:  $Therapeutic Activity: 8-22 mins                     Roney Marion, PT  Acute Rehabilitation Services Pager [phone] Office [place] 07/11/2019, 3:51 PM

## 2019-07-11 NOTE — TOC Progression Note (Signed)
Transition of Care [facility]) - Progression Note    Patient Details  Name: Jodi Frank MRN: [id number] Date of Birth: [birth year]  Transition [facility]) CM/SW Contact  [name], RN Phone Number: [phone] 07/11/2019, 11:30 AM  Clinical Narrative:    Patient has already received several bed offers. NCM contacted daughter to provide list. [facility] team following for transition needs.    Expected Discharge Plan: [place] Barriers to Discharge: SNF Pending bed offer, Continued Medical Work up  Expected Discharge Plan and Services Expected Discharge Plan: [place] In-house Referral: Clinical Social Work Discharge Planning Services: CM Consult Post Acute Care Choice: [place] arrangements for the past 2 months: Single Family Home                 DME Arranged: N/A DME Agency: NA       HH Arranged: NA HH Agency: NA         Social Determinants of Health (SDOH) Interventions    Readmission Risk Interventions No flowsheet data found.

## 2019-07-12 LAB — PROTIME-INR
INR: 2.7 — ABNORMAL HIGH (ref 0.8–1.2)
Prothrombin Time: 28.7 seconds — ABNORMAL HIGH (ref 11.4–15.2)

## 2019-07-12 LAB — SARS CORONAVIRUS 2 (TAT 6-24 HRS): SARS Coronavirus 2: NEGATIVE

## 2019-07-12 MED ORDER — WARFARIN SODIUM 4 MG PO TABS
4.0000 mg | ORAL_TABLET | Freq: Once | ORAL | Status: AC
  Administered 2019-07-12: 4 mg via ORAL
  Filled 2019-07-12: qty 1

## 2019-07-12 MED ORDER — HYDROCODONE-ACETAMINOPHEN 5-325 MG PO TABS: 1.0000 | ORAL_TABLET | Freq: Four times a day (QID) | ORAL | 0 refills | Status: AC | PRN

## 2019-07-12 MED ORDER — POLYETHYLENE GLYCOL 3350 17 G PO PACK: 17.0000 g | PACK | Freq: Every day | ORAL | 0 refills | Status: AC

## 2019-07-12 MED ORDER — METOPROLOL SUCCINATE ER 25 MG PO TB24: 12.5000 mg | ORAL_TABLET | Freq: Every day | ORAL | Status: AC

## 2019-07-12 MED ORDER — METOPROLOL TARTRATE 5 MG/5ML IV SOLN: 2.5000 mg | INTRAVENOUS | Status: DC | PRN

## 2019-07-12 NOTE — Discharge Summary (Signed)
Physician Discharge Summary  Jodi Frank [id number] DOB: [birth year] DOA: 07/09/2019  PCP: [name], NP  Admit date: 07/09/2019 Discharge date: 07/12/2019  Admitted From: Home Disposition:  Home  Discharge Condition:Stable CODE STATUS:DNR Diet recommendation: Heart Healthy  Brief/Interim Summary:  Patient is a 79 year old female with history of vascular dementia, paroxysmal A. 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  She lives alone and ambulates with the help of walker.  She fell out of her bed and fall landing on her back.  She felt pain on her left buttock after the fall.  No loss of consciousness.  No head injury.  On presentation she was found to be A. fib with RVR, hypertension, and leukocytosis.  CK was elevated .    CT abdomen/pelvis showed left sacral alae and superior and inferior pubic rami fracture.  Case discussed with orthopedics and planned for conservative management.  PT/OT consulted and recommended skilled nursing facility.  Social worker consulted. Patient is hemodynamically stable for discharge to skilled nursing facility as soon as bed is available.  Following problems were addressed during her hospitalization:  Pelvic fracture: Secondary to fall.  CT finding as above. Discussed with orthopedics, plan for conservative management.  Continue pain management.  PT/OT evaluation done and recommended SN facility.Weightbearing as tolerated.  Continue bowel regimen  Rhabdomyolysis: Associated with fall.  Mildly  elevated CK level.    Paroxysmal A. fib with RVR: Heart rate in the range of 140s on presentation.  Rate has improved and she is currently in normal sinus rhythm..  Not on any AV nodal blocking agent at home.  On Coumadin for chronic anticoagulation. Started on Toprol.  Supratherapeutic INR: INR elevated at 5 on  presentation. Goal INR 2.5-3.5.INR is currently therapeutic.  History of mechanical mitral valve replacement: On Coumadin.  Leukocytosis: Resolved  Elevated LFTs: Mild.  No right upper quadrant tenderness.  Right upper quadrant ultrasound was normal.  Elevated troponin: Mild with flat trend.  Most likely secondary to supply demand ischemia in the setting of A. fib with RVR.  No chest pain.  Vascular dementia: Continue supportive care.  She is on Aricept at home.  Currently alert and oriented  Hypertension: Monitor blood pressure.  Continue current regimen.  Hyperlipidemia: Continue Lipitor   Discharge Diagnoses:  Principal Problem:   Pelvic fracture ([place]) Active Problems:   Essential hypertension, benign   Long term (current) use of anticoagulants   H/O mitral valve replacement with mechanical valve   Mixed hyperlipidemia   Rhabdomyolysis   Atrial fibrillation with rapid ventricular response [facility])    Discharge Instructions  Discharge Instructions    Diet - low sodium heart healthy   Complete by: As directed    Discharge instructions   Complete by: As directed    1)Please check CBC and BMP tests in a week. 2)Monitor your INR. 3)Follow up with your cardiologist as an outpatient.   Increase activity slowly   Complete by: As directed      Allergies as of 07/12/2019      Reactions   Carvedilol Itching   Ace Inhibitors Other (See Comments)   Reaction (??)   Amlodipine Swelling   Hydrochlorothiazide Other (See Comments)   Hyponatremia   Metoprolol Other (See Comments)   Dizziness       Medication List    TAKE these medications   alendronate 70 MG tablet Commonly known  as: FOSAMAX Take 1 tablet (70 mg total) by mouth every Sunday. Take with a full glass of water on an empty stomach.   aspirin EC 81 MG tablet Take 1 tablet (81 mg total) by mouth daily.   atorvastatin 10 MG tablet Commonly known as: LIPITOR TAKE ONE TABLET BY MOUTH EVERY NIGHT AT  BEDTIME   donepezil 5 MG tablet Commonly known as: ARICEPT TAKE ONE TABLET BY MOUTH EVERY NIGHT AT BEDTIME   folic acid 1 MG tablet Commonly known as: FOLVITE Take 1 tablet (1 mg total) by mouth daily.   hydrALAZINE 50 MG tablet Commonly known as: APRESOLINE TAKE ONE TABLET BY MOUTH THREE TIMES A DAY   HYDROcodone-acetaminophen 5-325 MG tablet Commonly known as: NORCO/VICODIN Take 1-2 tablets by mouth every 6 (six) hours as needed for moderate pain or severe pain.   meclizine 25 MG tablet Commonly known as: ANTIVERT Take 1 tablet (25 mg total) by mouth every 8 (eight) hours as needed for dizziness.   metoprolol succinate 25 MG 24 hr tablet Commonly known as: TOPROL-XL Take 0.5 tablets (12.5 mg total) by mouth daily.   polyethylene glycol 17 g packet Commonly known as: MIRALAX / GLYCOLAX Take 17 g by mouth daily.   spironolactone 25 MG tablet Commonly known as: ALDACTONE TAKE ONE-HALF TABLET BY MOUTH DAILY   vitamin B-12 1000 MCG tablet Commonly known as: CYANOCOBALAMIN Take 1 tablet (1,000 mcg total) by mouth daily.   warfarin 4 MG tablet Commonly known as: COUMADIN Take as directed. If you are unsure how to take this medication, talk to your nurse or doctor. Original instructions: 4 mg daily by mouth except Tuesday take 2 mg      Follow-up Information    [name], NP. Schedule an appointment as soon as possible for a visit in 1 week(s).   Specialty: Geriatric Medicine Contact information: [place]. [place] [address] [phone]          Allergies  Allergen Reactions  . Carvedilol Itching  . Ace Inhibitors Other (See Comments)    Reaction (??)  . Amlodipine Swelling  . Hydrochlorothiazide Other (See Comments)    Hyponatremia   . Metoprolol Other (See Comments)    Dizziness     Consultations:  Orthopedics   Procedures/Studies: CT ABDOMEN PELVIS WO CONTRAST  Result Date: 07/09/2019 CLINICAL DATA:  Recent fall with back  and buttock pain EXAM: CT ABDOMEN AND PELVIS WITHOUT CONTRAST TECHNIQUE: Multidetector CT imaging of the abdomen and pelvis was performed following the standard protocol without IV contrast. COMPARISON:  07/12/2017 FINDINGS: Lower chest: No acute abnormality. Hepatobiliary: No focal liver abnormality is seen. No gallstones, gallbladder wall thickening, or biliary dilatation. Pancreas: Unremarkable. No pancreatic ductal dilatation or surrounding inflammatory changes. Spleen: Normal in size without focal abnormality. Adrenals/Urinary Tract: Adrenal glands are within normal limits. Kidneys demonstrate a normal appearance without renal calculi or obstructive change. The bladder is well distended. Stomach/Bowel: The appendix is within normal limits. No obstructive or inflammatory changes of the colon are seen. The small bowel is within normal limits. Stomach is unremarkable. Vascular/Lymphatic: Aortic atherosclerosis. No enlarged abdominal or pelvic lymph nodes. 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 Reproductive: Uterine calcifications are seen stable from the prior exam. No adnexal mass is noted. Other: No abdominal wall hernia or abnormality. No abdominopelvic ascites. Musculoskeletal: Postsurgical changes are noted in the proximal left femur. Left superior and inferior pubic rami fractures are noted. Left sacral ala fractures are noted as well. Degenerative  changes of the lumbar spine are seen. IMPRESSION: Left sacral ala and superior and inferior pubic rami fractures. Chronic changes as described above. Electronically Signed   By: Inez Catalina M.D.   On: 07/09/2019 19:27   DG Chest 1 View  Result Date: 07/09/2019 CLINICAL DATA:  Found down. EXAM: CHEST  1 VIEW COMPARISON:  Chest x-ray dated 05/04/2017. FINDINGS: Borderline cardiomegaly. Coarse lung markings bilaterally suggesting some degree of chronic interstitial lung disease. No confluent opacity to suggest pneumonia or pulmonary edema. No pleural effusion or  pneumothorax is seen. Osseous structures about the chest are unremarkable. Median sternotomy wires appear intact and stable in alignment. IMPRESSION: 1. No acute findings. No evidence of pneumonia or pulmonary edema. 2. 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 3. Borderline cardiomegaly. Electronically Signed   By: Franki Cabot M.D.   On: 07/09/2019 18:05   DG Pelvis 1-2 Views  Result Date: 07/09/2019 CLINICAL DATA:  Found down. EXAM: PELVIS - 1-2 VIEW COMPARISON:  None. FINDINGS: Single-view of the pelvis. Osseous structures appear normally aligned. No evidence of acute fracture or dislocation. Two screws appear appropriately positioned within the LEFT femoral neck. Soft tissues about the pelvis are unremarkable. IMPRESSION: No acute findings. Electronically Signed   By: Franki Cabot M.D.   On: 07/09/2019 18:06   CT Head Wo Contrast  Result Date: 07/09/2019 CLINICAL DATA:  Headache, Coumadin, found on floor EXAM: CT HEAD WITHOUT CONTRAST CT CERVICAL SPINE WITHOUT CONTRAST TECHNIQUE: Multidetector CT imaging of the head and cervical spine was performed following the standard protocol without intravenous contrast. Multiplanar CT image reconstructions of the cervical spine were also generated. COMPARISON:  CT 05/06/2019, 05/04/2017 FINDINGS: CT HEAD FINDINGS Brain: No evidence of acute infarction, hemorrhage, hydrocephalus, extra-axial collection or mass lesion/mass effect. Patchy hypodensity in the white matter consistent with chronic small vessel ischemic change. 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 Atrophy Vascular: No hyperdense vessels.  Carotid vascular calcification Skull: Normal. Negative for fracture or focal lesion. Sinuses/Orbits: No acute finding. Mild mucosal thickening in the sinuses Other: None CT CERVICAL SPINE FINDINGS Alignment: Straightening of the cervical spine. Trace retrolisthesis C3 on C4 without change. Facet alignment is maintained Skull base and vertebrae: No  acute fracture. No primary bone lesion or focal pathologic process. Soft tissues and spinal canal: No prevertebral fluid or swelling. No visible canal hematoma. Disc levels: Moderate diffuse degenerative change C3 through C7. Chronic superior endplate deformity at T1. Posterior disc osteophyte at C4-C5 results in mild canal narrowing. Facet degenerative change at multiple levels. Multiple level foraminal stenosis. Upper chest: Negative. Other: None IMPRESSION: 1. No CT evidence for acute intracranial abnormality. Atrophy and chronic small vessel ischemic changes of the white matter. 2. Degenerative changes of the cervical spine. No acute osseous abnormality. Electronically Signed   By: Donavan Foil M.D.   On: 07/09/2019 18:25   CT Cervical Spine Wo Contrast  Result Date: 07/09/2019 CLINICAL DATA:  Headache, Coumadin, found on floor EXAM: CT HEAD WITHOUT CONTRAST CT CERVICAL SPINE WITHOUT CONTRAST TECHNIQUE: Multidetector CT imaging of the head and cervical spine was performed following the standard protocol without intravenous contrast. Multiplanar CT image reconstructions of the cervical spine were also generated. COMPARISON:  CT 05/06/2019, 05/04/2017 FINDINGS: CT HEAD FINDINGS Brain: No evidence of acute infarction, hemorrhage, hydrocephalus, extra-axial collection or mass lesion/mass effect. Patchy hypodensity in the white matter consistent with chronic small vessel ischemic change. 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 Atrophy Vascular: No hyperdense vessels.  Carotid vascular calcification  Skull: Normal. Negative for fracture or focal lesion. Sinuses/Orbits: No acute finding. Mild mucosal thickening in the sinuses Other: None CT CERVICAL SPINE FINDINGS Alignment: Straightening of the cervical spine. Trace retrolisthesis C3 on C4 without change. Facet alignment is maintained Skull base and vertebrae: No acute fracture. No primary bone lesion or focal pathologic process. Soft tissues and  spinal canal: No prevertebral fluid or swelling. No visible canal hematoma. Disc levels: Moderate diffuse degenerative change C3 through C7. Chronic superior endplate deformity at T1. Posterior disc osteophyte at C4-C5 results in mild canal narrowing. Facet degenerative change at multiple levels. Multiple level foraminal stenosis. Upper chest: Negative. Other: None IMPRESSION: 1. No CT evidence for acute intracranial abnormality. Atrophy and chronic small vessel ischemic changes of the white matter. 2. Degenerative changes of the cervical spine. No acute osseous abnormality. Electronically Signed   By: Donavan Foil M.D.   On: 07/09/2019 18:25   DG FEMUR MIN 2 VIEWS LEFT  Result Date: 07/09/2019 CLINICAL DATA:  Found down. EXAM: LEFT FEMUR 2 VIEWS COMPARISON:  None. FINDINGS: No evidence of acute osseous fracture or dislocation. Fixation screws are present at the LEFT femoral neck. The screws appear intact and appropriately positioned. Adjacent soft tissues are unremarkable. IMPRESSION: No acute findings. Electronically Signed   By: Franki Cabot M.D.   On: 07/09/2019 18:03   US Abdomen Limited RUQ  Result Date: 07/10/2019 CLINICAL DATA:  Elevated LFTs. EXAM: ULTRASOUND ABDOMEN LIMITED RIGHT UPPER QUADRANT COMPARISON:  CT abdomen and pelvis 07/09/2019 FINDINGS: Gallbladder: No gallstones or wall thickening visualized. No sonographic Murphy sign noted by sonographer. Common bile duct: Diameter: 4.1 mm Liver: No focal lesion identified. Within normal limits in parenchymal echogenicity. Portal vein is patent on color Doppler imaging with normal direction of blood flow towards the liver. Other: None. IMPRESSION: 1. Normal exam. Electronically Signed   By: Kerby Moors M.D.   On: 07/10/2019 06:23       Subjective: Patient seen and examined at the bedside this morning.  Hemodynamically stable for discharge today.  Discharge Exam: Vitals:   07/12/19 0454 07/12/19 0824  BP: 130/80 (!) 155/85  Pulse:  90 90  Resp: 17 17  Temp: 98 F (36.7 C) 98.6 F (37 C)  SpO2: 95% 96%   Vitals:   07/11/19 1541 07/11/19 2016 07/12/19 0454 07/12/19 0824  BP: 130/79 (!) 148/83 130/80 (!) 155/85  Pulse: 96 92 90 90  Resp: 18 18 17 17   Temp: 97.7 F (36.5 C) 98 F (36.7 C) 98 F (36.7 C) 98.6 F (37 C)  TempSrc: Oral Oral Oral Oral  SpO2: 92% 96% 95% 96%  Weight:      Height:        General: Pt is alert, awake, not in acute distress Cardiovascular: RRR, S1/S2 +, no rubs, no gallops Respiratory: CTA bilaterally, no wheezing, no rhonchi Abdominal: Soft, NT, ND, bowel sounds + Extremities: no edema, no cyanosis    The results of significant diagnostics from this hospitalization (including imaging, microbiology, ancillary and laboratory) are listed below for reference.     Microbiology: Recent Results (from the past 240 hour(s))  Blood culture (routine x 2)     Status: None (Preliminary result)   Collection Time: 07/09/19  4:49 PM   Specimen: BLOOD  Result Value Ref Range Status   Specimen Description BLOOD SITE NOT SPECIFIED  Final   Special Requests   Final    BOTTLES DRAWN AEROBIC AND ANAEROBIC Blood Culture results may not be optimal due  to an inadequate volume of blood received in culture bottles   Culture   Final    NO GROWTH 3 DAYS Performed at [facility], [place] [address] [place], [place] [address]    Report Status PENDING  Incomplete  Blood culture (routine x 2)     Status: None (Preliminary result)   Collection Time: 07/09/19  6:57 PM   Specimen: BLOOD  Result Value Ref Range Status   Specimen Description BLOOD BLOOD RIGHT FOREARM  Final   Special Requests   Final    BOTTLES DRAWN AEROBIC AND ANAEROBIC Blood Culture adequate volume   Culture   Final    NO GROWTH 3 DAYS Performed at [facility], [place] [address], [place], [place] [address]    Report Status PENDING  Incomplete  Respiratory Panel by RT PCR (Flu A&B, Covid) - Nasopharyngeal Swab     Status:  None   Collection Time: 07/09/19  8:30 PM   Specimen: Nasopharyngeal Swab  Result Value Ref Range Status   SARS Coronavirus 2 by RT PCR NEGATIVE NEGATIVE Final    Comment: (NOTE) SARS-CoV-2 target nucleic acids are NOT DETECTED. The SARS-CoV-2 RNA is generally detectable in upper respiratoy specimens during the acute phase of infection. The lowest concentration of SARS-CoV-2 viral copies this assay can detect is 131 copies/mL. A negative result does not preclude SARS-Cov-2 infection and should not be used as the sole basis for treatment or other patient management decisions. A negative result may occur with  improper specimen collection/handling, submission of specimen other than nasopharyngeal swab, presence of viral mutation(s) within the areas targeted by this assay, and inadequate number of viral copies (<131 copies/mL). A negative result must be combined with clinical observations, patient history, and epidemiological information. The expected result is Negative. Fact Sheet for Patients:  PinkCheek.be Fact Sheet for Healthcare Providers:  GravelBags.it This test is not yet ap proved or cleared by the [place] FDA and  has been authorized for detection and/or diagnosis of SARS-CoV-2 by FDA under an Emergency Use Authorization (EUA). This EUA will remain  in effect (meaning this test can be used) for the duration of the COVID-19 declaration under Section 564(b)(1) of the Act, 21 U.S.C. section 360bbb-3(b)(1), unless the authorization is terminated or revoked sooner.    Influenza A by PCR NEGATIVE NEGATIVE Final   Influenza B by PCR NEGATIVE NEGATIVE Final    Comment: (NOTE) The Xpert Xpress SARS-CoV-2/FLU/RSV assay is intended as an aid in  the diagnosis of influenza from Nasopharyngeal swab specimens and  should not be used as a sole basis for treatment. Nasal washings and  aspirates are unacceptable for Xpert  Xpress SARS-CoV-2/FLU/RSV  testing. Fact Sheet for Patients: PinkCheek.be Fact Sheet for Healthcare Providers: GravelBags.it This test is not yet approved or cleared by the [place] FDA and  has been authorized for detection and/or diagnosis of SARS-CoV-2 by  FDA under an Emergency Use Authorization (EUA). This EUA will remain  in effect (meaning this test can be used) for the duration of the  Covid-19 declaration under Section 564(b)(1) of the Act, 21  U.S.C. section 360bbb-3(b)(1), unless the authorization is  terminated or revoked. Performed at [facility], [place] [address] [place], [place] [address]   Urine Culture     Status: None   Collection Time: 07/10/19  6:46 AM   Specimen: Urine, Catheterized  Result Value Ref Range Status   Specimen Description URINE, CATHETERIZED  Final   Special  Requests NONE  Final   Culture   Final    NO GROWTH Performed at [facility], [place] [address], [place], [place] [address]    Report Status 07/11/2019 FINAL  Final     Labs: BNP (last 3 results) No results for input(s): BNP in the last 8760 hours. Basic Metabolic Panel: Recent Labs  Lab 07/09/19 1649 07/10/19 0646  NA 133* 137  K 4.6 3.7  CL 98 106  CO2 21* 19*  GLUCOSE 112* 97  BUN 15 13  CREATININE 0.92 0.81  CALCIUM 9.1 8.5*   Liver Function Tests: Recent Labs  Lab 07/09/19 1649  AST 46*  ALT 28  ALKPHOS 56  BILITOT 3.1*  PROT 6.9  ALBUMIN 4.1   No results for input(s): LIPASE, AMYLASE in the last 168 hours. No results for input(s): AMMONIA in the last 168 hours. CBC: Recent Labs  Lab 07/09/19 1649 07/10/19 0646 07/11/19 1026  WBC 20.7* 14.1* 9.7  NEUTROABS 19.0*  --  8.3*  HGB 14.4 12.5 12.6  HCT 44.1 38.2 37.7  MCV 84.8 84.7 84.0  PLT 263 208 239   Cardiac Enzymes: Recent Labs  Lab 07/09/19 1649 07/10/19 0646  CKTOTAL 835* 393*   BNP: Invalid input(s): POCBNP CBG: No  results for input(s): GLUCAP in the last 168 hours. D-Dimer No results for input(s): DDIMER in the last 72 hours. Hgb A1c No results for input(s): HGBA1C in the last 72 hours. Lipid Profile No results for input(s): CHOL, HDL, LDLCALC, TRIG, CHOLHDL, LDLDIRECT in the last 72 hours. Thyroid function studies No results for input(s): TSH, T4TOTAL, T3FREE, THYROIDAB in the last 72 hours.  Invalid input(s): FREET3 Anemia work up No results for input(s): VITAMINB12, FOLATE, FERRITIN, TIBC, IRON, RETICCTPCT in the last 72 hours. Urinalysis    Component Value Date/Time   COLORURINE YELLOW 07/09/2019 2026   APPEARANCEUR HAZY (A) 07/09/2019 2026   LABSPEC 1.024 07/09/2019 2026   PHURINE 5.0 07/09/2019 2026   GLUCOSEU NEGATIVE 07/09/2019 2026   HGBUR SMALL (A) 07/09/2019 2026   BILIRUBINUR NEGATIVE 07/09/2019 2026   BILIRUBINUR neg 08/21/2017 1400   KETONESUR 80 (A) 07/09/2019 2026   PROTEINUR 100 (A) 07/09/2019 2026   UROBILINOGEN 0.2 08/21/2017 1400   UROBILINOGEN 1.0 11/02/2012 1951   NITRITE NEGATIVE 07/09/2019 2026   LEUKOCYTESUR MODERATE (A) 07/09/2019 2026   Sepsis Labs Invalid input(s): PROCALCITONIN,  WBC,  LACTICIDVEN Microbiology Recent Results (from the past 240 hour(s))  Blood culture (routine x 2)     Status: None (Preliminary result)   Collection Time: 07/09/19  4:49 PM   Specimen: BLOOD  Result Value Ref Range Status   Specimen Description BLOOD SITE NOT SPECIFIED  Final   Special Requests   Final    BOTTLES DRAWN AEROBIC AND ANAEROBIC Blood Culture results may not be optimal due to an inadequate volume of blood received in culture bottles   Culture   Final    NO GROWTH 3 DAYS Performed at [facility], [place] [address], [place], [place] [address]    Report Status PENDING  Incomplete  Blood culture (routine x 2)     Status: None (Preliminary result)   Collection Time: 07/09/19  6:57 PM   Specimen: BLOOD  Result Value Ref Range Status   Specimen Description  BLOOD BLOOD RIGHT FOREARM  Final   Special Requests   Final    BOTTLES DRAWN AEROBIC AND ANAEROBIC Blood Culture adequate volume   Culture   Final  NO GROWTH 3 DAYS Performed at [facility], [place] [address], [place], [place] [address]    Report Status PENDING  Incomplete  Respiratory Panel by RT PCR (Flu A&B, Covid) - Nasopharyngeal Swab     Status: None   Collection Time: 07/09/19  8:30 PM   Specimen: Nasopharyngeal Swab  Result Value Ref Range Status   SARS Coronavirus 2 by RT PCR NEGATIVE NEGATIVE Final    Comment: (NOTE) SARS-CoV-2 target nucleic acids are NOT DETECTED. The SARS-CoV-2 RNA is generally detectable in upper respiratoy specimens during the acute phase of infection. The lowest concentration of SARS-CoV-2 viral copies this assay can detect is 131 copies/mL. A negative result does not preclude SARS-Cov-2 infection and should not be used as the sole basis for treatment or other patient management decisions. A negative result may occur with  improper specimen collection/handling, submission of specimen other than nasopharyngeal swab, presence of viral mutation(s) within the areas targeted by this assay, and inadequate number of viral copies (<131 copies/mL). A negative result must be combined with clinical observations, patient history, and epidemiological information. The expected result is Negative. Fact Sheet for Patients:  PinkCheek.be Fact Sheet for Healthcare Providers:  GravelBags.it This test is not yet ap proved or cleared by the [place] FDA and  has been authorized for detection and/or diagnosis of SARS-CoV-2 by FDA under an Emergency Use Authorization (EUA). This EUA will remain  in effect (meaning this test can be used) for the duration of the COVID-19 declaration under Section 564(b)(1) of the Act, 21 U.S.C. section 360bbb-3(b)(1), unless the authorization is terminated or revoked  sooner.    Influenza A by PCR NEGATIVE NEGATIVE Final   Influenza B by PCR NEGATIVE NEGATIVE Final    Comment: (NOTE) The Xpert Xpress SARS-CoV-2/FLU/RSV assay is intended as an aid in  the diagnosis of influenza from Nasopharyngeal swab specimens and  should not be used as a sole basis for treatment. Nasal washings and  aspirates are unacceptable for Xpert Xpress SARS-CoV-2/FLU/RSV  testing. Fact Sheet for Patients: PinkCheek.be Fact Sheet for Healthcare Providers: GravelBags.it This test is not yet approved or cleared by the [place] FDA and  has been authorized for detection and/or diagnosis of SARS-CoV-2 by  FDA under an Emergency Use Authorization (EUA). This EUA will remain  in effect (meaning this test can be used) for the duration of the  Covid-19 declaration under Section 564(b)(1) of the Act, 21  U.S.C. section 360bbb-3(b)(1), unless the authorization is  terminated or revoked. Performed at [facility], [place] [address], [place], [place] [address]   Urine Culture     Status: None   Collection Time: 07/10/19  6:46 AM   Specimen: Urine, Catheterized  Result Value Ref Range Status   Specimen Description URINE, CATHETERIZED  Final   Special Requests NONE  Final   Culture   Final    NO GROWTH Performed at [facility] [address] [place], [place] [address]    Report Status 07/11/2019 FINAL  Final    Please note: You were cared for by a hospitalist during your hospital stay. Once you are discharged, your primary care physician will handle any further medical issues. Please note that NO REFILLS for any discharge medications will be authorized once you are discharged, as it is imperative that you return to your primary care physician (or establish a relationship with a primary care physician if you do not have one) for your post hospital discharge  needs so that they can reassess your need for  medications and monitor your lab values.    Time coordinating discharge: 40 minutes  SIGNED:   Shelly Coss, MD  [facility] 07/12/2019, 10:48 AM Pager [phone]  If 7PM-7AM, please contact night-coverage [email or web address] Password [email or web address]

## 2019-07-12 NOTE — Progress Notes (Signed)
Physical Therapy Treatment Patient Details Name: Jodi Frank MRN: [id number] DOB: Jun 17, 1940 Today's Date: 07/12/2019    History of Present Illness Jodi Frank is a 79 y.o. female with medical history significant of vascular dementia, paroxysmal A. 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    PT Comments    Pt in bed upon arrival of PT/OT, agreeable to session with focus on progressing mobility despite pain in LLE. The pt continues to present with limitations in functional mobility and independence due to above dx and resulting pain and weakness from reduced mobility. The pt was very pain limited today, and required assist of 2 to safely complete transition to sitting EOB and pivot to recliner with use of RW. The pt uses short, shuffle steps with minimal clearance to move laterally to recliner, but remains unable to take steps due to poor wt acceptance to LLE. The pt showed decreased understanding of how to get in touch with RN staff, we left her with hand written explanation of how to call RN, and alerted RN student to check in on pt frequently to avoid increased pain from sitting in recliner for too long. The pt will continue to benefit from skilled PT to progress mobility and safety understanding.     Follow Up Recommendations  SNF;Supervision/Assistance - 24 hour     Equipment Recommendations  Rolling walker with 5" wheels;3in1 (PT)    Recommendations for Other Services       Precautions / Restrictions Precautions Precautions: Fall Restrictions Weight Bearing Restrictions: Yes LLE Weight Bearing: Weight bearing as tolerated Other Position/Activity Restrictions: WBAT per MD note on 2/13     Mobility  Bed Mobility Overal bed mobility: Needs Assistance Bed Mobility: Supine to Sit     Supine to sit: Mod assist     General bed mobility comments: modA of 1 with HOB elevated and repeated cues, +2 for safety  Transfers Overall transfer level: Needs assistance Equipment used: Rolling walker (2 wheeled) Transfers: Sit to/from [facility] Sit to Stand: Mod assist;+2 physical assistance;From elevated surface;Min assist Stand pivot transfers: Mod assist       General transfer comment: heavy modA of 2 to power up, easier with raised bed, pt benefits from repeated cues for shuffle steps to pivot to R, minA for RW management. Pt very pain limited today, poor wt acceptance to LLE  Ambulation/Gait Ambulation/Gait assistance: Mod assist;+2 physical assistance Gait Distance (Feet): 3 Feet(lateral shuffle step to recliner) Assistive device: Rolling walker (2 wheeled) Gait Pattern/deviations: Step-to pattern;Decreased weight shift to left;Shuffle;Narrow base of support;Trunk flexed Gait velocity: extremely slow Gait velocity interpretation: <1.31 ft/sec, indicative of household ambulator General Gait Details: Step-by-step cues for sequence and to push down through RW to take pressure off of LLE in stance and allow for RLE advancement; more painful today. minimal clearance for either foot   Stairs             Wheelchair Mobility    Modified Rankin (Stroke Patients Only)       Balance Overall balance assessment: Needs assistance Sitting-balance support: Single extremity supported;Feet supported Sitting balance-Leahy Scale: Fair Sitting balance - Comments: pt able to static sit without UE support, prefers UE  support due to pain     Standing balance-Leahy Scale: Poor Standing balance comment: reliant on BUE support and external min/modA                            Cognition Arousal/Alertness: Awake/alert Behavior During Therapy: WFL for  tasks assessed/performed Overall Cognitive Status: Impaired/Different from baseline Area of Impairment: Memory;Safety/judgement;Problem solving                     Memory: Decreased short-term memory(history of dementia)   Safety/Judgement: Decreased awareness of safety;Decreased awareness of deficits   Problem Solving: Slow processing;Decreased initiation;Difficulty sequencing;Requires verbal cues General Comments: pt benefits from repeated commands, cues, and goals. For example, needed multiple reminders for sit-stand sequence, how to contact RN staff (with call-bell), and how long to stay in chair after PT session. RN notified      Exercises      General Comments General comments (skin integrity, edema, etc.): HR 100-130 with seated rest in recliner and at EOB. High of 146 with short pivot to recliner      Pertinent Vitals/Pain Pain Assessment: Faces Faces Pain Scale: Hurts whole lot Pain Location: L buttock and groin, with bed mobiltiy and with amb Pain Intervention(s): Limited activity within patient's tolerance;Monitored during session;Repositioned;Patient requesting pain meds-RN notified;Relaxation    Home Living                      Prior Function            PT Goals (current goals can now be found in the care plan section) Acute Rehab PT Goals Patient Stated Goal: back to independence PT Goal Formulation: With patient Time For Goal Achievement: 07/24/19 Potential to Achieve Goals: Fair Progress towards PT goals: Progressing toward goals    Frequency    Min 3X/week      PT Plan Current plan remains appropriate    Co-evaluation PT/OT/SLP Co-Evaluation/Treatment: Yes Reason for Co-Treatment: Necessary to address cognition/behavior during functional activity;To address functional/ADL transfers;For patient/therapist safety PT goals addressed during session: Mobility/safety with mobility;Balance;Proper use of DME;Strengthening/ROM         AM-PAC PT "6 Clicks" Mobility   Outcome Measure  Help needed turning from your back to your side while in a flat bed without using bedrails?: A Lot Help needed moving from lying on your back to sitting on the side of a flat bed without using bedrails?: A Lot Help needed moving to and from a bed to a chair (including a wheelchair)?: A Lot Help needed standing up from a chair using your arms (e.g., wheelchair or bedside chair)?: A Lot Help needed to walk in hospital room?: A Lot Help needed climbing 3-5 steps with a railing? : Total 6 Click Score: 11    End of Session Equipment Utilized During Treatment: Gait belt Activity Tolerance: Patient tolerated treatment well;Patient limited by pain Patient left: with call bell/phone within reach;in bed;with bed alarm set Nurse Communication: Mobility status PT Visit Diagnosis: Other abnormalities of gait and mobility (R26.89);Pain;History of falling (Z91.81) Pain - Right/Left: Left Pain - part of body: Leg     Time: 1343-1401 PT Time Calculation (min) (ACUTE ONLY): 18 min  Charges:  $Gait Training: 8-22 mins                     Karma Ganja, PT, DPT   Acute Rehabilitation Department Pager #: [phone]  Otho Bellows 07/12/2019, 2:50 PM

## 2019-07-12 NOTE — Progress Notes (Signed)
Occupational Therapy Treatment Patient Details Name: KHRISTIAN ARTIGA MRN: [id number] DOB: 1941/01/13 Today's Date: 07/12/2019    History of present illness ONELL CHRISTENSEN is a 79 y.o. female with medical history significant of vascular dementia, paroxysmal A. 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   OT comments  Pt agreeable to OT/PT session, but was limited due to pain. She required modA+2 to stand and pivot toward the recliner. She requires frequent cues for safe hand placement and sequencing pivotal steps and assistance for safe management of RW. Pt required written instructions and 3x explanation of how to contact RN when ready to return to bed. She required minA for use of TV remote control. Pt did not appear to understand source of pain and required frequent explanation from therapist that her pelvis is fractured. Pt will continue to benefit from skilled OT services to maximize safety and independence with ADL/IADL and functional mobility. Will continue to follow acutely and progress as tolerated.    Follow Up Recommendations  SNF;Supervision/Assistance - 24 hour    Equipment Recommendations  3 in 1 bedside commode    Recommendations for Other Services      Precautions / Restrictions Precautions Precautions: Fall Restrictions Weight Bearing Restrictions: Yes LLE Weight Bearing: Weight bearing as tolerated Other Position/Activity Restrictions: WBAT per MD note on 2/13       Mobility Bed Mobility Overal bed mobility: Needs Assistance Bed Mobility: Supine to Sit     Supine to sit: Mod assist     General bed mobility comments: modA of 1 with HOB elevated and repeated cues, +2 for  safety  Transfers Overall transfer level: Needs assistance Equipment used: Rolling walker (2 wheeled) Transfers: Sit to/from [facility] Sit to Stand: Mod assist;+2 physical assistance;From elevated surface Stand pivot transfers: Mod assist;+2 physical assistance;+2 safety/equipment       General transfer comment: heavy modA of 2 to power up, easier with raised bed, pt benefits from repeated cues for shuffle steps to pivot to R, minA for RW management. Pt very pain limited today, poor wt acceptance to LLE    Balance Overall balance assessment: Needs assistance Sitting-balance support: Single extremity supported;Feet supported Sitting balance-Leahy Scale: Fair Sitting balance - Comments: pt able to static sit without UE support, prefers UE support due to pain     Standing balance-Leahy Scale: Poor Standing balance comment: reliant on BUE support and external modA                           ADL either performed or assessed with clinical judgement   ADL Overall ADL's : Needs assistance/impaired Eating/Feeding: Set up;Sitting   Grooming: Set up;Sitting   Upper Body Bathing: Min guard;Sitting   Lower Body Bathing: Moderate assistance;Sit to/from stand   Upper Body Dressing : Set up;Sitting       Toilet Transfer: Moderate assistance;+2 for safety/equipment;+2 for physical assistance Toilet Transfer Details (indicate cue type and reason): stand pivot toward right Toileting- Clothing Manipulation and Hygiene: Moderate assistance;+2 for physical assistance;+2 for safety/equipment       Functional mobility during ADLs: Moderate assistance;+2 for physical assistance;+2 for safety/equipment;Rolling walker General ADL Comments: consistent  cues for safe hand placement, cues for sequencing pivotal steps toward recliner on R side     Vision       Perception     Praxis      Cognition Arousal/Alertness: Awake/alert Behavior During Therapy: WFL for  tasks assessed/performed Overall Cognitive Status: Impaired/Different from baseline Area of Impairment: Memory;Safety/judgement;Problem solving                     Memory: Decreased short-term memory(history of dementia)   Safety/Judgement: Decreased awareness of safety;Decreased awareness of deficits   Problem Solving: Slow processing;Decreased initiation;Difficulty sequencing;Requires verbal cues General Comments: pt benefits from repeated commands, cues, and goals. For example, needed multiple reminders for sit-stand sequence, how to contact RN staff (with call-bell), and how long to stay in chair after session with written instruction. RN notified        Exercises     Shoulder Instructions       General Comments HR 100-130bpm with seated rest break in recliner;briefly 146bpm following stand-pivot to the recliner    Pertinent Vitals/ Pain       Pain Assessment: Faces Faces Pain Scale: Hurts whole lot Pain Location: L buttock and groin, with bed mobiltiy and with amb Pain Descriptors / Indicators: Grimacing;Crying Pain Intervention(s): Limited activity within patient's tolerance;Monitored during session;Repositioned  Home Living                                          Prior Functioning/Environment              Frequency  Min 2X/week        Progress Toward Goals  OT Goals(current goals can now be found in the care plan section)  Progress towards OT goals: Progressing toward goals  Acute Rehab OT Goals Patient Stated Goal: pain management OT Goal Formulation: With patient Time For Goal Achievement: 07/24/19 Potential to Achieve Goals: Good ADL Goals Pt Will Perform Lower Body Bathing: with min assist;sit to/from stand Pt Will Perform Lower Body Dressing: with min assist;with adaptive equipment;sit to/from stand Pt Will Transfer to Toilet: with min assist;ambulating;bedside commode Pt Will Perform Toileting - Clothing Manipulation  and hygiene: with min guard assist;sit to/from stand;sitting/lateral leans  Plan Discharge plan remains appropriate;Frequency remains appropriate    Co-evaluation    PT/OT/SLP Co-Evaluation/Treatment: Yes Reason for Co-Treatment: Necessary to address cognition/behavior during functional activity;For patient/therapist safety;To address functional/ADL transfers PT goals addressed during session: Mobility/safety with mobility;Balance;Proper use of DME;Strengthening/ROM OT goals addressed during session: ADL's and self-care      AM-PAC OT "6 Clicks" Daily Activity     Outcome Measure   Help from another person eating meals?: None Help from another person taking care of personal grooming?: A Little Help from another person toileting, which includes using toliet, bedpan, or urinal?: A Lot Help from another person bathing (including washing, rinsing, drying)?: A Lot Help from another person to put on and taking off regular upper body clothing?: A Little Help from another person to put on and taking off regular lower body clothing?: A Lot 6 Click Score: 16    End of Session Equipment Utilized During Treatment: Gait belt;Rolling walker  OT Visit Diagnosis: Other abnormalities of gait and mobility (R26.89);Muscle weakness (generalized) (M62.81);History of falling (Z91.81);Other symptoms and signs involving cognitive function;Pain Pain - Right/Left: Left Pain - part of body: Leg(back)   Activity Tolerance Patient tolerated  treatment well;Patient limited by pain   Patient Left in chair;with call bell/phone within reach;with chair alarm set   Nurse Communication Mobility status        Time: 1333-1401 OT Time Calculation (min): 28 min  Charges: OT Treatments $Self Care/Home Management : 8-22 mins  Dorinda Hill OTR/L Acute Rehabilitation Services Office: [facility] 07/12/2019, 3:04 PM

## 2019-07-12 NOTE — Progress Notes (Signed)
ANTICOAGULATION CONSULT NOTE - Follow Up Consult  Pharmacy Consult for Warfarin Indication: MVR and afib  Allergies  Allergen Reactions  . Carvedilol Itching  . Ace Inhibitors Other (See Comments)    Reaction (??)  . Amlodipine Swelling  . Hydrochlorothiazide Other (See Comments)    Hyponatremia   . Metoprolol Other (See Comments)    Dizziness     Patient Measurements: Height: 5\' 3"  (160 cm) Weight: 144 lb (65.3 kg) IBW/kg (Calculated) : 52.4  Vital Signs: Temp: 98.6 F (37 C) (02/16 0824) Temp Source: Oral (02/16 0824) BP: 155/85 (02/16 0824) Pulse Rate: 90 (02/16 0824)  Labs: Recent Labs    07/09/19 1649 07/09/19 1649 07/09/19 1945 07/10/19 0646 07/11/19 1026 07/12/19 0316  HGB 14.4   < >  --  12.5 12.6  --   HCT 44.1  --   --  38.2 37.7  --   PLT 263  --   --  208 239  --   LABPROT 47.3*   < >  --  57.3* 33.8* 28.7*  INR 5.1*   < >  --  6.5* 3.3* 2.7*  CREATININE 0.92  --   --  0.81  --   --   CKTOTAL 835*  --   --  393*  --   --   TROPONINIHS 53*  --  77* 76*  --   --    < > = values in this interval not displayed.    Estimated Creatinine Clearance: 52.9 mL/min (by C-G formula based on SCr of 0.81 mg/dL).  Assessment:  Patient is a 84 yof that in on warfarin for Afib and hx of a MVR. Patient presents after a fall; patient was found a day and half after fall. PTA her dose was Warfarin 4mg  daily except Tues 2mg . INR on admission 2/13 was 5.1. Last INR in clinic on 2/5 was 2.3.    INR down to 2.7 today. No bleeding reported.  Goal of Therapy:  INR 2.5-3.5 Monitor platelets by anticoagulation protocol: Yes   Plan:   Warfarin 4 mg x 1 today.  Daily PT/INR.  Alanda Slim, PharmD, [facility] Clinical Pharmacist Please see AMION for all Pharmacists' Contact Phone Numbers 07/12/2019, 8:32 AM

## 2019-07-12 NOTE — TOC Progression Note (Signed)
Transition of Care [facility]) - Progression Note    Patient Details  Name: Jodi Frank MRN: [id number] Date of Birth: 1941/03/05  Transition of [facility]) CM/SW Contact  [name], RN Phone Number: [phone] 07/12/2019, 1:52 PM  Clinical Narrative:    Received email from patient's daughter, [name], this afternoon that she has chosen [facility]. Facility to initiate authorization, and anticipate patient being able to transition to SNF tomorrow. Notified MD. Covid pending. TOC following for transition needs.    Expected Discharge Plan: [place] Barriers to Discharge: SNF Pending bed offer, Continued Medical Work up  Expected Discharge Plan and Services Expected Discharge Plan: [place] In-house Referral: Clinical Social Work Discharge Planning Services: CM Consult Post Acute Care Choice: [place] arrangements for the past 2 months: Single Family Home Expected Discharge Date: 07/12/19               DME Arranged: N/A DME Agency: NA       HH Arranged: NA HH Agency: NA         Social Determinants of Health (SDOH) Interventions    Readmission Risk Interventions No flowsheet data found.

## 2019-07-12 NOTE — Plan of Care (Signed)
  Problem: Pain Managment: Goal: General experience of comfort will improve Outcome: Progressing   Problem: Safety: Goal: Ability to remain free from injury will improve Outcome: Progressing   Problem: Skin Integrity: Goal: Risk for impaired skin integrity will decrease Outcome: Progressing   

## 2019-07-13 DIAGNOSIS — S32509A Unspecified fracture of unspecified pubis, initial encounter for closed fracture: Secondary | ICD-10-CM | POA: Diagnosis not present

## 2019-07-13 DIAGNOSIS — E569 Vitamin deficiency, unspecified: Secondary | ICD-10-CM | POA: Diagnosis not present

## 2019-07-13 DIAGNOSIS — M6282 Rhabdomyolysis: Secondary | ICD-10-CM | POA: Diagnosis not present

## 2019-07-13 DIAGNOSIS — E7849 Other hyperlipidemia: Secondary | ICD-10-CM | POA: Diagnosis not present

## 2019-07-13 DIAGNOSIS — M169 Osteoarthritis of hip, unspecified: Secondary | ICD-10-CM | POA: Diagnosis not present

## 2019-07-13 DIAGNOSIS — R319 Hematuria, unspecified: Secondary | ICD-10-CM | POA: Diagnosis not present

## 2019-07-13 DIAGNOSIS — I10 Essential (primary) hypertension: Secondary | ICD-10-CM | POA: Diagnosis not present

## 2019-07-13 DIAGNOSIS — Z7901 Long term (current) use of anticoagulants: Secondary | ICD-10-CM | POA: Diagnosis not present

## 2019-07-13 DIAGNOSIS — Z952 Presence of prosthetic heart valve: Secondary | ICD-10-CM | POA: Diagnosis not present

## 2019-07-13 DIAGNOSIS — R52 Pain, unspecified: Secondary | ICD-10-CM | POA: Diagnosis not present

## 2019-07-13 DIAGNOSIS — Z7401 Bed confinement status: Secondary | ICD-10-CM | POA: Diagnosis not present

## 2019-07-13 DIAGNOSIS — M2550 Pain in unspecified joint: Secondary | ICD-10-CM | POA: Diagnosis not present

## 2019-07-13 DIAGNOSIS — M6281 Muscle weakness (generalized): Secondary | ICD-10-CM | POA: Diagnosis not present

## 2019-07-13 DIAGNOSIS — E785 Hyperlipidemia, unspecified: Secondary | ICD-10-CM | POA: Diagnosis not present

## 2019-07-13 DIAGNOSIS — R58 Hemorrhage, not elsewhere classified: Secondary | ICD-10-CM | POA: Diagnosis not present

## 2019-07-13 DIAGNOSIS — R69 Illness, unspecified: Secondary | ICD-10-CM | POA: Diagnosis not present

## 2019-07-13 DIAGNOSIS — Z79899 Other long term (current) drug therapy: Secondary | ICD-10-CM | POA: Diagnosis not present

## 2019-07-13 DIAGNOSIS — M810 Age-related osteoporosis without current pathological fracture: Secondary | ICD-10-CM | POA: Diagnosis not present

## 2019-07-13 DIAGNOSIS — N390 Urinary tract infection, site not specified: Secondary | ICD-10-CM | POA: Diagnosis not present

## 2019-07-13 DIAGNOSIS — Z954 Presence of other heart-valve replacement: Secondary | ICD-10-CM | POA: Diagnosis not present

## 2019-07-13 DIAGNOSIS — R2681 Unsteadiness on feet: Secondary | ICD-10-CM | POA: Diagnosis not present

## 2019-07-13 DIAGNOSIS — S329XXD Fracture of unspecified parts of lumbosacral spine and pelvis, subsequent encounter for fracture with routine healing: Secondary | ICD-10-CM | POA: Diagnosis not present

## 2019-07-13 DIAGNOSIS — D649 Anemia, unspecified: Secondary | ICD-10-CM | POA: Diagnosis not present

## 2019-07-13 DIAGNOSIS — I4891 Unspecified atrial fibrillation: Secondary | ICD-10-CM | POA: Diagnosis not present

## 2019-07-13 DIAGNOSIS — R4182 Altered mental status, unspecified: Secondary | ICD-10-CM | POA: Diagnosis not present

## 2019-07-13 LAB — PROTIME-INR
INR: 3.2 — ABNORMAL HIGH (ref 0.8–1.2)
Prothrombin Time: 32.5 seconds — ABNORMAL HIGH (ref 11.4–15.2)

## 2019-07-13 MED ORDER — WARFARIN SODIUM 2 MG PO TABS
2.0000 mg | ORAL_TABLET | Freq: Once | ORAL | Status: DC
  Filled 2019-07-13: qty 1

## 2019-07-13 NOTE — TOC Transition Note (Signed)
Transition of Care [facility]) - CM/SW Discharge Note   Patient Details  Name: Jodi Frank MRN: [id number] Date of Birth: 07/18/1940  Transition of Care [facility]) CM/SW Contact:  [name], RN Phone Number: [phone] 07/13/2019, 1:59 PM   Clinical Narrative:    Patient to transition to [facility] [place] today.  Notified by admissions at SNF that patient will go to room 802, and the nurse may call report to [phone]. RN made aware.   PTAR arranged for transport. No further TOC needs identified at this time.    Final next level of care: Skilled Nursing Facility Barriers to Discharge: No Barriers Identified   Patient Goals and CMS Choice Patient states their goals for this hospitalization and ongoing recovery are:: SNF then likely transition to long term care CMS Medicare.gov Compare Post Acute Care list provided to:: Patient Represenative (must comment) Choice offered to / list presented to : Adult Children  Discharge Placement              Patient chooses bed at: [facility] Patient to be transferred to facility by: [place] Name of family member notified: Fredric Dine, daughter Patient and family notified of of transfer: 07/13/19  Discharge Plan and Services In-house Referral: Clinical Social Work Discharge Planning Services: CM Consult Post Acute Care Choice: [place]          DME Arranged: N/A DME Agency: NA       HH Arranged: NA HH Agency: NA        Social Determinants of Health (SDOH) Interventions     Readmission Risk Interventions No flowsheet data found.

## 2019-07-13 NOTE — Progress Notes (Signed)
ANTICOAGULATION CONSULT NOTE - Follow Up Consult  Pharmacy Consult for Warfarin Indication: MVR and afib  Patient Measurements: Height: 5\' 3"  (160 cm) Weight: 144 lb (65.3 kg) IBW/kg (Calculated) : 52.4  Vital Signs: Temp: 98.3 F (36.8 C) (02/17 0741) Temp Source: Oral (02/17 0741) BP: 150/88 (02/17 0741) Pulse Rate: 98 (02/17 0741)  Labs: Recent Labs    07/11/19 1026 07/12/19 0316 07/13/19 0340  HGB 12.6  --   --   HCT 37.7  --   --   PLT 239  --   --   LABPROT 33.8* 28.7* 32.5*  INR 3.3* 2.7* 3.2*    Estimated Creatinine Clearance: 52.9 mL/min (by C-G formula based on SCr of 0.81 mg/dL).  Assessment:  Patient is a 89 yof that in on warfarin for Afib and hx of a MVR. Patient presents after a fall; patient was found a day and half after fall. PTA her dose was Warfarin 4mg  daily except Tues 2mg . INR on admission 2/13 was 5.1. Last INR in clinic on 2/5 was 2.3.   INR is up to 3.2. No bleeding noted.   Goal of Therapy:  INR 2.5-3.5 Monitor platelets by anticoagulation protocol: Yes   Plan:  Warfarin 2mg  PO x 1 tonight if pt is still here Daily INR  Salome Arnt, PharmD, BCPS Clinical Pharmacist Please see AMION for all pharmacy numbers 07/13/2019 1:45 PM

## 2019-07-13 NOTE — Discharge Summary (Addendum)
Discharge summary addendum See Dr. Corwin Levins discharge 2/16 for full details, updates below  Jodi Frank [id number] DOB: 08-26-1940 DOA: 07/09/2019 PCP: [name], NP  Admit date: 07/09/2019 Discharge date: 07/13/2019  Disposition:  SNF  A & P  Pelvic fracture.  Stable.  Continue pain control, conservative management.  Weightbearing as tolerated.  Status post mechanical mitral valve replacement.  Continue warfarin per pharmacy.  INR therapeutic on discharge.  Recommend close attention to INR per pharmacy protocol at skilled nursing facility.  Mild elevation total bilirubin and AST.  Discussed in detail with daughter.  Patient drinks wine, quantity unclear but daughter suspects patient accidentally drinks too much at times.  This fits with her elevated AST and total bilirubin.  We discussed this testing results in the hospitalization.  She is aware of disposition today to skilled nursing facility and agrees.  Spontaneously resolved.  We also discussed previous laboratory studies which showed mild elevation of total bilirubin on most data points including on a previous hospitalization when she had significant bruising.  So elevation this time may be multifactorial including bruising, supratherapeutic INR, mechanical valve.  I think at this point she is stable for discharge and would recommend hepatic function panel by 2/24 with attention to total bilirubin and AST and ALT.   Remainder as per discharge summary 2/16 Dr. Manya Silvas, MD  [facility] Direct contact: see [email or web address] (further directions at bottom of note if needed) 7PM-7AM contact night coverage as at bottom of note 07/13/2019, 11:31 AM  LOS: 4 days    Interval History/Subjective  Feels fine, pain controlled.   Objective   Vitals:  Vitals:   07/13/19 0341 07/13/19 0741  BP: (!) 149/87 (!) 150/88  Pulse: (!) 102 98  Resp: 19 17  Temp: (!) 97.5 F (36.4 C) 98.3 F (36.8 C)  SpO2: 98%  95%    Exam:  General: appears calm and comfortable  Cardiovascular.  Regular rate and rhythm.  No murmur, rub or gallop.  No significant lower extremity edema.   Respiratory.  Clear to auscultation bilaterally.  No wheezes, rales or rhonchi.  Normal respiratory effort. Psychiatric.  Grossly normal mood and affect.  Speech fluent and appropriate.  I have personally reviewed the following:   Today's Data  . INR 3.2  Scheduled Meds: . alendronate  70 mg Oral Q Sun  . donepezil  5 mg Oral QHS  . folic acid  1 mg Oral Daily  . hydrALAZINE  50 mg Oral TID  . metoprolol succinate  12.5 mg Oral Daily  . polyethylene glycol  17 g Oral Daily  . senna  1 tablet Oral QHS  . vitamin B-12  1,000 mcg Oral Daily  . Warfarin - Pharmacist Dosing Inpatient   Does not apply q1800   Continuous Infusions:  Principal Problem:   Pelvic fracture (HCC) Active Problems:   Essential hypertension, benign   Long term (current) use of anticoagulants   H/O mitral valve replacement with mechanical valve   Mixed hyperlipidemia   Rhabdomyolysis   Atrial fibrillation with rapid ventricular response (HCC)   LOS: 4 days   How to contact the [facility] Attending or Consulting provider [place] or covering provider during after hours [place], for this patient?  1. Check the care team in [facility] and look for a) attending/consulting [facility] provider listed and b) the [facility] team listed 2. Log into [email or web address] and use [place]'s universal password to access. If you do  not have the password, please contact the hospital operator. 3. Locate the [facility] provider you are looking for under [facility] and page to a number that you can be directly reached. 4. If you still have difficulty reaching the provider, please page the [facility] (Director on Call) for the Hospitalists listed on amion for assistance.

## 2019-07-13 NOTE — Progress Notes (Signed)
Pt is forgetful, calm and cooperative. Discharging to SNF today. Report was given to [employer] at [facility], [place].  Discharged pt transported by [facility].

## 2019-07-14 LAB — CULTURE, BLOOD (ROUTINE X 2)
Culture: NO GROWTH
Culture: NO GROWTH
Special Requests: ADEQUATE

## 2019-07-15 ENCOUNTER — Ambulatory Visit: Payer: Medicare HMO | Admitting: Nurse Practitioner

## 2019-07-16 DIAGNOSIS — S329XXD Fracture of unspecified parts of lumbosacral spine and pelvis, subsequent encounter for fracture with routine healing: Secondary | ICD-10-CM | POA: Diagnosis not present

## 2019-07-16 DIAGNOSIS — E785 Hyperlipidemia, unspecified: Secondary | ICD-10-CM | POA: Diagnosis not present

## 2019-07-16 DIAGNOSIS — R69 Illness, unspecified: Secondary | ICD-10-CM | POA: Diagnosis not present

## 2019-07-16 DIAGNOSIS — I10 Essential (primary) hypertension: Secondary | ICD-10-CM | POA: Diagnosis not present

## 2019-07-16 DIAGNOSIS — M810 Age-related osteoporosis without current pathological fracture: Secondary | ICD-10-CM | POA: Diagnosis not present

## 2019-07-16 DIAGNOSIS — Z952 Presence of prosthetic heart valve: Secondary | ICD-10-CM | POA: Diagnosis not present

## 2019-07-21 DIAGNOSIS — I10 Essential (primary) hypertension: Secondary | ICD-10-CM | POA: Diagnosis not present

## 2019-07-21 DIAGNOSIS — R69 Illness, unspecified: Secondary | ICD-10-CM | POA: Diagnosis not present

## 2019-07-21 DIAGNOSIS — Z952 Presence of prosthetic heart valve: Secondary | ICD-10-CM | POA: Diagnosis not present

## 2019-07-21 DIAGNOSIS — S329XXD Fracture of unspecified parts of lumbosacral spine and pelvis, subsequent encounter for fracture with routine healing: Secondary | ICD-10-CM | POA: Diagnosis not present

## 2019-07-21 DIAGNOSIS — M810 Age-related osteoporosis without current pathological fracture: Secondary | ICD-10-CM | POA: Diagnosis not present

## 2019-07-21 DIAGNOSIS — E785 Hyperlipidemia, unspecified: Secondary | ICD-10-CM | POA: Diagnosis not present

## 2019-07-28 DIAGNOSIS — I4891 Unspecified atrial fibrillation: Secondary | ICD-10-CM | POA: Diagnosis not present

## 2019-07-28 DIAGNOSIS — Z7901 Long term (current) use of anticoagulants: Secondary | ICD-10-CM | POA: Diagnosis not present

## 2019-07-29 DIAGNOSIS — R69 Illness, unspecified: Secondary | ICD-10-CM | POA: Diagnosis not present

## 2019-07-29 DIAGNOSIS — R319 Hematuria, unspecified: Secondary | ICD-10-CM | POA: Diagnosis not present

## 2019-07-29 DIAGNOSIS — N390 Urinary tract infection, site not specified: Secondary | ICD-10-CM | POA: Diagnosis not present

## 2019-07-29 DIAGNOSIS — D649 Anemia, unspecified: Secondary | ICD-10-CM | POA: Diagnosis not present

## 2019-07-29 DIAGNOSIS — Z79899 Other long term (current) drug therapy: Secondary | ICD-10-CM | POA: Diagnosis not present

## 2019-08-02 ENCOUNTER — Encounter: Payer: PRIVATE HEALTH INSURANCE | Admitting: Cardiology

## 2019-08-02 DIAGNOSIS — M545 Low back pain: Secondary | ICD-10-CM | POA: Diagnosis not present

## 2019-08-02 DIAGNOSIS — R0989 Other specified symptoms and signs involving the circulatory and respiratory systems: Secondary | ICD-10-CM | POA: Diagnosis not present

## 2019-08-02 DIAGNOSIS — R69 Illness, unspecified: Secondary | ICD-10-CM | POA: Diagnosis not present

## 2019-08-02 DIAGNOSIS — I4891 Unspecified atrial fibrillation: Secondary | ICD-10-CM | POA: Diagnosis not present

## 2019-08-02 DIAGNOSIS — S329XXD Fracture of unspecified parts of lumbosacral spine and pelvis, subsequent encounter for fracture with routine healing: Secondary | ICD-10-CM | POA: Diagnosis not present

## 2019-08-02 DIAGNOSIS — G4700 Insomnia, unspecified: Secondary | ICD-10-CM | POA: Diagnosis not present

## 2019-08-02 DIAGNOSIS — Z8673 Personal history of transient ischemic attack (TIA), and cerebral infarction without residual deficits: Secondary | ICD-10-CM | POA: Diagnosis not present

## 2019-08-02 DIAGNOSIS — I10 Essential (primary) hypertension: Secondary | ICD-10-CM | POA: Diagnosis not present

## 2019-08-02 DIAGNOSIS — Z7901 Long term (current) use of anticoagulants: Secondary | ICD-10-CM | POA: Diagnosis not present

## 2019-08-02 DIAGNOSIS — M546 Pain in thoracic spine: Secondary | ICD-10-CM | POA: Diagnosis not present

## 2019-08-02 DIAGNOSIS — M810 Age-related osteoporosis without current pathological fracture: Secondary | ICD-10-CM | POA: Diagnosis not present

## 2019-08-02 DIAGNOSIS — E785 Hyperlipidemia, unspecified: Secondary | ICD-10-CM | POA: Diagnosis not present

## 2019-08-02 DIAGNOSIS — Z952 Presence of prosthetic heart valve: Secondary | ICD-10-CM | POA: Diagnosis not present

## 2019-08-02 DIAGNOSIS — M6282 Rhabdomyolysis: Secondary | ICD-10-CM | POA: Diagnosis not present

## 2019-08-02 DIAGNOSIS — N390 Urinary tract infection, site not specified: Secondary | ICD-10-CM | POA: Diagnosis not present

## 2019-08-02 DIAGNOSIS — M533 Sacrococcygeal disorders, not elsewhere classified: Secondary | ICD-10-CM | POA: Diagnosis not present

## 2019-08-02 DIAGNOSIS — R079 Chest pain, unspecified: Secondary | ICD-10-CM | POA: Diagnosis not present

## 2019-08-02 NOTE — Progress Notes (Signed)
  ROS erroneous encounter

## 2019-08-04 DIAGNOSIS — I4891 Unspecified atrial fibrillation: Secondary | ICD-10-CM | POA: Diagnosis not present

## 2019-08-04 DIAGNOSIS — R569 Unspecified convulsions: Secondary | ICD-10-CM | POA: Diagnosis not present

## 2019-08-04 DIAGNOSIS — R319 Hematuria, unspecified: Secondary | ICD-10-CM | POA: Diagnosis not present

## 2019-08-04 DIAGNOSIS — R69 Illness, unspecified: Secondary | ICD-10-CM | POA: Diagnosis not present

## 2019-08-04 DIAGNOSIS — D649 Anemia, unspecified: Secondary | ICD-10-CM | POA: Diagnosis not present

## 2019-08-04 DIAGNOSIS — N390 Urinary tract infection, site not specified: Secondary | ICD-10-CM | POA: Diagnosis not present

## 2019-08-04 NOTE — Progress Notes (Signed)
This encounter was created in error - please disregard.

## 2019-08-05 DIAGNOSIS — I10 Essential (primary) hypertension: Secondary | ICD-10-CM | POA: Diagnosis not present

## 2019-08-05 DIAGNOSIS — I4891 Unspecified atrial fibrillation: Secondary | ICD-10-CM | POA: Diagnosis not present

## 2019-08-06 DIAGNOSIS — I4891 Unspecified atrial fibrillation: Secondary | ICD-10-CM | POA: Diagnosis not present

## 2019-08-06 DIAGNOSIS — Z954 Presence of other heart-valve replacement: Secondary | ICD-10-CM | POA: Diagnosis not present

## 2019-08-09 DIAGNOSIS — I4891 Unspecified atrial fibrillation: Secondary | ICD-10-CM | POA: Diagnosis not present

## 2019-08-09 DIAGNOSIS — R6889 Other general symptoms and signs: Secondary | ICD-10-CM | POA: Diagnosis not present

## 2019-08-12 DIAGNOSIS — Z954 Presence of other heart-valve replacement: Secondary | ICD-10-CM | POA: Diagnosis not present

## 2019-08-12 DIAGNOSIS — I4891 Unspecified atrial fibrillation: Secondary | ICD-10-CM | POA: Diagnosis not present

## 2019-08-14 DIAGNOSIS — I10 Essential (primary) hypertension: Secondary | ICD-10-CM | POA: Diagnosis not present

## 2019-08-14 DIAGNOSIS — R69 Illness, unspecified: Secondary | ICD-10-CM | POA: Diagnosis not present

## 2019-08-14 DIAGNOSIS — M810 Age-related osteoporosis without current pathological fracture: Secondary | ICD-10-CM | POA: Diagnosis not present

## 2019-08-14 DIAGNOSIS — E785 Hyperlipidemia, unspecified: Secondary | ICD-10-CM | POA: Diagnosis not present

## 2019-08-14 DIAGNOSIS — S329XXD Fracture of unspecified parts of lumbosacral spine and pelvis, subsequent encounter for fracture with routine healing: Secondary | ICD-10-CM | POA: Diagnosis not present

## 2019-08-14 DIAGNOSIS — Z952 Presence of prosthetic heart valve: Secondary | ICD-10-CM | POA: Diagnosis not present

## 2019-08-15 DIAGNOSIS — M79642 Pain in left hand: Secondary | ICD-10-CM | POA: Diagnosis not present

## 2019-08-18 DIAGNOSIS — I4891 Unspecified atrial fibrillation: Secondary | ICD-10-CM | POA: Diagnosis not present

## 2019-08-18 DIAGNOSIS — S329XXD Fracture of unspecified parts of lumbosacral spine and pelvis, subsequent encounter for fracture with routine healing: Secondary | ICD-10-CM | POA: Diagnosis not present

## 2019-08-18 DIAGNOSIS — M6282 Rhabdomyolysis: Secondary | ICD-10-CM | POA: Diagnosis not present

## 2019-08-18 DIAGNOSIS — G4700 Insomnia, unspecified: Secondary | ICD-10-CM | POA: Diagnosis not present

## 2019-08-18 DIAGNOSIS — R69 Illness, unspecified: Secondary | ICD-10-CM | POA: Diagnosis not present

## 2019-08-20 DIAGNOSIS — I10 Essential (primary) hypertension: Secondary | ICD-10-CM | POA: Diagnosis not present

## 2019-08-20 DIAGNOSIS — R569 Unspecified convulsions: Secondary | ICD-10-CM | POA: Diagnosis not present

## 2019-08-20 DIAGNOSIS — I4891 Unspecified atrial fibrillation: Secondary | ICD-10-CM | POA: Diagnosis not present

## 2019-08-23 DIAGNOSIS — G4700 Insomnia, unspecified: Secondary | ICD-10-CM | POA: Diagnosis not present

## 2019-08-23 DIAGNOSIS — R69 Illness, unspecified: Secondary | ICD-10-CM | POA: Diagnosis not present

## 2019-08-23 DIAGNOSIS — S329XXD Fracture of unspecified parts of lumbosacral spine and pelvis, subsequent encounter for fracture with routine healing: Secondary | ICD-10-CM | POA: Diagnosis not present

## 2019-08-23 DIAGNOSIS — F0391 Unspecified dementia with behavioral disturbance: Secondary | ICD-10-CM | POA: Diagnosis not present

## 2019-08-23 DIAGNOSIS — M6282 Rhabdomyolysis: Secondary | ICD-10-CM | POA: Diagnosis not present

## 2019-08-23 DIAGNOSIS — I4891 Unspecified atrial fibrillation: Secondary | ICD-10-CM | POA: Diagnosis not present

## 2019-08-25 DIAGNOSIS — I4891 Unspecified atrial fibrillation: Secondary | ICD-10-CM | POA: Diagnosis not present

## 2019-08-29 DIAGNOSIS — I4891 Unspecified atrial fibrillation: Secondary | ICD-10-CM | POA: Diagnosis not present

## 2019-09-03 DIAGNOSIS — I4891 Unspecified atrial fibrillation: Secondary | ICD-10-CM | POA: Diagnosis not present

## 2019-09-06 DIAGNOSIS — G4700 Insomnia, unspecified: Secondary | ICD-10-CM | POA: Diagnosis not present

## 2019-09-06 DIAGNOSIS — I4891 Unspecified atrial fibrillation: Secondary | ICD-10-CM | POA: Diagnosis not present

## 2019-09-06 DIAGNOSIS — R69 Illness, unspecified: Secondary | ICD-10-CM | POA: Diagnosis not present

## 2019-09-06 DIAGNOSIS — M6282 Rhabdomyolysis: Secondary | ICD-10-CM | POA: Diagnosis not present

## 2019-09-06 DIAGNOSIS — M810 Age-related osteoporosis without current pathological fracture: Secondary | ICD-10-CM | POA: Diagnosis not present

## 2019-09-06 DIAGNOSIS — R109 Unspecified abdominal pain: Secondary | ICD-10-CM | POA: Diagnosis not present

## 2019-09-06 DIAGNOSIS — I10 Essential (primary) hypertension: Secondary | ICD-10-CM | POA: Diagnosis not present

## 2019-09-06 DIAGNOSIS — E785 Hyperlipidemia, unspecified: Secondary | ICD-10-CM | POA: Diagnosis not present

## 2019-09-06 DIAGNOSIS — Z952 Presence of prosthetic heart valve: Secondary | ICD-10-CM | POA: Diagnosis not present

## 2019-09-06 DIAGNOSIS — K5900 Constipation, unspecified: Secondary | ICD-10-CM | POA: Diagnosis not present

## 2019-09-06 DIAGNOSIS — S329XXD Fracture of unspecified parts of lumbosacral spine and pelvis, subsequent encounter for fracture with routine healing: Secondary | ICD-10-CM | POA: Diagnosis not present

## 2019-09-06 DIAGNOSIS — F259 Schizoaffective disorder, unspecified: Secondary | ICD-10-CM | POA: Diagnosis not present

## 2019-09-07 DIAGNOSIS — R6889 Other general symptoms and signs: Secondary | ICD-10-CM | POA: Diagnosis not present

## 2019-09-07 DIAGNOSIS — N390 Urinary tract infection, site not specified: Secondary | ICD-10-CM | POA: Diagnosis not present

## 2019-09-07 DIAGNOSIS — Z1159 Encounter for screening for other viral diseases: Secondary | ICD-10-CM | POA: Diagnosis not present

## 2019-09-07 DIAGNOSIS — D649 Anemia, unspecified: Secondary | ICD-10-CM | POA: Diagnosis not present

## 2019-09-07 DIAGNOSIS — R319 Hematuria, unspecified: Secondary | ICD-10-CM | POA: Diagnosis not present

## 2019-09-08 DIAGNOSIS — R6889 Other general symptoms and signs: Secondary | ICD-10-CM | POA: Diagnosis not present

## 2019-09-08 DIAGNOSIS — I4891 Unspecified atrial fibrillation: Secondary | ICD-10-CM | POA: Diagnosis not present

## 2019-09-10 DIAGNOSIS — I4891 Unspecified atrial fibrillation: Secondary | ICD-10-CM | POA: Diagnosis not present

## 2019-09-10 DIAGNOSIS — Z7901 Long term (current) use of anticoagulants: Secondary | ICD-10-CM | POA: Diagnosis not present

## 2019-09-13 DIAGNOSIS — Z1159 Encounter for screening for other viral diseases: Secondary | ICD-10-CM | POA: Diagnosis not present

## 2019-09-14 DIAGNOSIS — F259 Schizoaffective disorder, unspecified: Secondary | ICD-10-CM | POA: Diagnosis not present

## 2019-09-14 DIAGNOSIS — G4700 Insomnia, unspecified: Secondary | ICD-10-CM | POA: Diagnosis not present

## 2019-09-14 DIAGNOSIS — R69 Illness, unspecified: Secondary | ICD-10-CM | POA: Diagnosis not present

## 2019-09-14 DIAGNOSIS — S329XXD Fracture of unspecified parts of lumbosacral spine and pelvis, subsequent encounter for fracture with routine healing: Secondary | ICD-10-CM | POA: Diagnosis not present

## 2019-09-14 DIAGNOSIS — M6282 Rhabdomyolysis: Secondary | ICD-10-CM | POA: Diagnosis not present

## 2019-09-16 DIAGNOSIS — R6889 Other general symptoms and signs: Secondary | ICD-10-CM | POA: Diagnosis not present

## 2019-09-16 DIAGNOSIS — I4891 Unspecified atrial fibrillation: Secondary | ICD-10-CM | POA: Diagnosis not present

## 2019-09-20 DIAGNOSIS — Z954 Presence of other heart-valve replacement: Secondary | ICD-10-CM | POA: Diagnosis not present

## 2019-09-20 DIAGNOSIS — Z1159 Encounter for screening for other viral diseases: Secondary | ICD-10-CM | POA: Diagnosis not present

## 2019-09-20 DIAGNOSIS — I4891 Unspecified atrial fibrillation: Secondary | ICD-10-CM | POA: Diagnosis not present

## 2019-09-29 DIAGNOSIS — I4891 Unspecified atrial fibrillation: Secondary | ICD-10-CM | POA: Diagnosis not present

## 2019-09-29 DIAGNOSIS — Z954 Presence of other heart-valve replacement: Secondary | ICD-10-CM | POA: Diagnosis not present

## 2019-10-03 DIAGNOSIS — H2513 Age-related nuclear cataract, bilateral: Secondary | ICD-10-CM | POA: Diagnosis not present

## 2019-10-04 DIAGNOSIS — R69 Illness, unspecified: Secondary | ICD-10-CM | POA: Diagnosis not present

## 2019-10-04 DIAGNOSIS — M810 Age-related osteoporosis without current pathological fracture: Secondary | ICD-10-CM | POA: Diagnosis not present

## 2019-10-04 DIAGNOSIS — I10 Essential (primary) hypertension: Secondary | ICD-10-CM | POA: Diagnosis not present

## 2019-10-04 DIAGNOSIS — Z952 Presence of prosthetic heart valve: Secondary | ICD-10-CM | POA: Diagnosis not present

## 2019-10-04 DIAGNOSIS — S329XXD Fracture of unspecified parts of lumbosacral spine and pelvis, subsequent encounter for fracture with routine healing: Secondary | ICD-10-CM | POA: Diagnosis not present

## 2019-10-04 DIAGNOSIS — G4700 Insomnia, unspecified: Secondary | ICD-10-CM | POA: Diagnosis not present

## 2019-10-04 DIAGNOSIS — M6282 Rhabdomyolysis: Secondary | ICD-10-CM | POA: Diagnosis not present

## 2019-10-04 DIAGNOSIS — E785 Hyperlipidemia, unspecified: Secondary | ICD-10-CM | POA: Diagnosis not present

## 2019-10-04 DIAGNOSIS — F259 Schizoaffective disorder, unspecified: Secondary | ICD-10-CM | POA: Diagnosis not present

## 2019-10-04 DIAGNOSIS — G309 Alzheimer's disease, unspecified: Secondary | ICD-10-CM | POA: Diagnosis not present

## 2019-10-07 ENCOUNTER — Other Ambulatory Visit: Payer: Self-pay

## 2019-10-07 ENCOUNTER — Encounter: Payer: Medicare Other | Admitting: Nurse Practitioner

## 2019-10-07 ENCOUNTER — Telehealth: Payer: Self-pay

## 2019-10-07 NOTE — Progress Notes (Signed)
   This service is provided via telemedicine  No vital signs collected/recorded due to the encounter was a telemedicine visit.

## 2019-10-07 NOTE — Telephone Encounter (Signed)
I made 3 unsuccessful attempts to call patient for her scheduled appointment today with [name], NP at 2:15 pm for Annual Wellness Visit.  Side notes: 2 attempts was to the main number on file- daughters number  1 attempt to patients home number

## 2019-10-08 DIAGNOSIS — R569 Unspecified convulsions: Secondary | ICD-10-CM | POA: Diagnosis not present

## 2019-10-08 DIAGNOSIS — I4891 Unspecified atrial fibrillation: Secondary | ICD-10-CM | POA: Diagnosis not present

## 2019-10-08 DIAGNOSIS — R300 Dysuria: Secondary | ICD-10-CM | POA: Diagnosis not present

## 2019-10-08 DIAGNOSIS — I10 Essential (primary) hypertension: Secondary | ICD-10-CM | POA: Diagnosis not present

## 2019-10-08 DIAGNOSIS — Z7901 Long term (current) use of anticoagulants: Secondary | ICD-10-CM | POA: Diagnosis not present

## 2019-10-10 DIAGNOSIS — R319 Hematuria, unspecified: Secondary | ICD-10-CM | POA: Diagnosis not present

## 2019-10-10 DIAGNOSIS — N390 Urinary tract infection, site not specified: Secondary | ICD-10-CM | POA: Diagnosis not present

## 2019-10-10 DIAGNOSIS — R300 Dysuria: Secondary | ICD-10-CM | POA: Diagnosis not present

## 2019-10-10 DIAGNOSIS — I4891 Unspecified atrial fibrillation: Secondary | ICD-10-CM | POA: Diagnosis not present

## 2019-10-10 DIAGNOSIS — I10 Essential (primary) hypertension: Secondary | ICD-10-CM | POA: Diagnosis not present

## 2019-10-10 NOTE — Progress Notes (Signed)
This encounter was created in error - please disregard.

## 2019-10-11 DIAGNOSIS — N390 Urinary tract infection, site not specified: Secondary | ICD-10-CM | POA: Diagnosis not present

## 2019-10-11 DIAGNOSIS — I4891 Unspecified atrial fibrillation: Secondary | ICD-10-CM | POA: Diagnosis not present

## 2019-10-12 DIAGNOSIS — H25013 Cortical age-related cataract, bilateral: Secondary | ICD-10-CM | POA: Diagnosis not present

## 2019-10-12 DIAGNOSIS — H43393 Other vitreous opacities, bilateral: Secondary | ICD-10-CM | POA: Diagnosis not present

## 2019-10-12 DIAGNOSIS — H2513 Age-related nuclear cataract, bilateral: Secondary | ICD-10-CM | POA: Diagnosis not present

## 2019-10-15 DIAGNOSIS — I4891 Unspecified atrial fibrillation: Secondary | ICD-10-CM | POA: Diagnosis not present

## 2019-10-18 DIAGNOSIS — N390 Urinary tract infection, site not specified: Secondary | ICD-10-CM | POA: Diagnosis not present

## 2019-10-18 DIAGNOSIS — R319 Hematuria, unspecified: Secondary | ICD-10-CM | POA: Diagnosis not present

## 2019-10-20 DIAGNOSIS — I4891 Unspecified atrial fibrillation: Secondary | ICD-10-CM | POA: Diagnosis not present

## 2019-10-27 DIAGNOSIS — I4891 Unspecified atrial fibrillation: Secondary | ICD-10-CM | POA: Diagnosis not present

## 2019-11-01 DIAGNOSIS — S329XXD Fracture of unspecified parts of lumbosacral spine and pelvis, subsequent encounter for fracture with routine healing: Secondary | ICD-10-CM | POA: Diagnosis not present

## 2019-11-01 DIAGNOSIS — G309 Alzheimer's disease, unspecified: Secondary | ICD-10-CM | POA: Diagnosis not present

## 2019-11-01 DIAGNOSIS — G4700 Insomnia, unspecified: Secondary | ICD-10-CM | POA: Diagnosis not present

## 2019-11-01 DIAGNOSIS — R69 Illness, unspecified: Secondary | ICD-10-CM | POA: Diagnosis not present

## 2019-11-02 DIAGNOSIS — R569 Unspecified convulsions: Secondary | ICD-10-CM | POA: Diagnosis not present

## 2019-11-02 DIAGNOSIS — R69 Illness, unspecified: Secondary | ICD-10-CM | POA: Diagnosis not present

## 2019-11-03 DIAGNOSIS — R569 Unspecified convulsions: Secondary | ICD-10-CM | POA: Diagnosis not present

## 2019-11-03 DIAGNOSIS — R319 Hematuria, unspecified: Secondary | ICD-10-CM | POA: Diagnosis not present

## 2019-11-03 DIAGNOSIS — N390 Urinary tract infection, site not specified: Secondary | ICD-10-CM | POA: Diagnosis not present

## 2019-11-03 DIAGNOSIS — I4891 Unspecified atrial fibrillation: Secondary | ICD-10-CM | POA: Diagnosis not present

## 2019-11-03 DIAGNOSIS — D649 Anemia, unspecified: Secondary | ICD-10-CM | POA: Diagnosis not present

## 2019-11-03 DIAGNOSIS — Z79899 Other long term (current) drug therapy: Secondary | ICD-10-CM | POA: Diagnosis not present

## 2019-11-05 DIAGNOSIS — N390 Urinary tract infection, site not specified: Secondary | ICD-10-CM | POA: Diagnosis not present

## 2019-11-05 DIAGNOSIS — Z952 Presence of prosthetic heart valve: Secondary | ICD-10-CM | POA: Diagnosis not present

## 2019-11-05 DIAGNOSIS — M810 Age-related osteoporosis without current pathological fracture: Secondary | ICD-10-CM | POA: Diagnosis not present

## 2019-11-05 DIAGNOSIS — E785 Hyperlipidemia, unspecified: Secondary | ICD-10-CM | POA: Diagnosis not present

## 2019-11-05 DIAGNOSIS — R69 Illness, unspecified: Secondary | ICD-10-CM | POA: Diagnosis not present

## 2019-11-05 DIAGNOSIS — I10 Essential (primary) hypertension: Secondary | ICD-10-CM | POA: Diagnosis not present

## 2019-11-05 DIAGNOSIS — S329XXD Fracture of unspecified parts of lumbosacral spine and pelvis, subsequent encounter for fracture with routine healing: Secondary | ICD-10-CM | POA: Diagnosis not present

## 2019-11-09 DIAGNOSIS — I4891 Unspecified atrial fibrillation: Secondary | ICD-10-CM | POA: Diagnosis not present

## 2019-11-18 DIAGNOSIS — I4891 Unspecified atrial fibrillation: Secondary | ICD-10-CM | POA: Diagnosis not present

## 2019-11-24 DIAGNOSIS — I4891 Unspecified atrial fibrillation: Secondary | ICD-10-CM | POA: Diagnosis not present

## 2019-11-29 DIAGNOSIS — G4700 Insomnia, unspecified: Secondary | ICD-10-CM | POA: Diagnosis not present

## 2019-11-29 DIAGNOSIS — R69 Illness, unspecified: Secondary | ICD-10-CM | POA: Diagnosis not present

## 2019-11-29 DIAGNOSIS — S329XXD Fracture of unspecified parts of lumbosacral spine and pelvis, subsequent encounter for fracture with routine healing: Secondary | ICD-10-CM | POA: Diagnosis not present

## 2019-11-29 DIAGNOSIS — G309 Alzheimer's disease, unspecified: Secondary | ICD-10-CM | POA: Diagnosis not present

## 2019-12-05 DIAGNOSIS — I4891 Unspecified atrial fibrillation: Secondary | ICD-10-CM | POA: Diagnosis not present

## 2019-12-06 DIAGNOSIS — R69 Illness, unspecified: Secondary | ICD-10-CM | POA: Diagnosis not present

## 2019-12-06 DIAGNOSIS — F0391 Unspecified dementia with behavioral disturbance: Secondary | ICD-10-CM | POA: Diagnosis not present

## 2019-12-07 DIAGNOSIS — Z1159 Encounter for screening for other viral diseases: Secondary | ICD-10-CM | POA: Diagnosis not present

## 2019-12-10 DIAGNOSIS — I4891 Unspecified atrial fibrillation: Secondary | ICD-10-CM | POA: Diagnosis not present

## 2019-12-10 DIAGNOSIS — Z954 Presence of other heart-valve replacement: Secondary | ICD-10-CM | POA: Diagnosis not present

## 2019-12-14 DIAGNOSIS — I70293 Other atherosclerosis of native arteries of extremities, bilateral legs: Secondary | ICD-10-CM | POA: Diagnosis not present

## 2019-12-14 DIAGNOSIS — B351 Tinea unguium: Secondary | ICD-10-CM | POA: Diagnosis not present

## 2019-12-14 DIAGNOSIS — L602 Onychogryphosis: Secondary | ICD-10-CM | POA: Diagnosis not present

## 2019-12-14 DIAGNOSIS — L608 Other nail disorders: Secondary | ICD-10-CM | POA: Diagnosis not present

## 2019-12-14 DIAGNOSIS — Z7901 Long term (current) use of anticoagulants: Secondary | ICD-10-CM | POA: Diagnosis not present

## 2019-12-18 DIAGNOSIS — I10 Essential (primary) hypertension: Secondary | ICD-10-CM | POA: Diagnosis not present

## 2019-12-18 DIAGNOSIS — J690 Pneumonitis due to inhalation of food and vomit: Secondary | ICD-10-CM | POA: Diagnosis not present

## 2019-12-18 DIAGNOSIS — N3091 Cystitis, unspecified with hematuria: Secondary | ICD-10-CM | POA: Diagnosis not present

## 2019-12-18 DIAGNOSIS — E782 Mixed hyperlipidemia: Secondary | ICD-10-CM | POA: Diagnosis not present

## 2019-12-18 DIAGNOSIS — R1313 Dysphagia, pharyngeal phase: Secondary | ICD-10-CM | POA: Diagnosis not present

## 2019-12-18 DIAGNOSIS — Z20828 Contact with and (suspected) exposure to other viral communicable diseases: Secondary | ICD-10-CM | POA: Diagnosis not present

## 2019-12-18 DIAGNOSIS — R4182 Altered mental status, unspecified: Secondary | ICD-10-CM | POA: Diagnosis not present

## 2019-12-18 DIAGNOSIS — I4891 Unspecified atrial fibrillation: Secondary | ICD-10-CM | POA: Diagnosis not present

## 2019-12-18 DIAGNOSIS — Z952 Presence of prosthetic heart valve: Secondary | ICD-10-CM | POA: Diagnosis not present

## 2019-12-18 DIAGNOSIS — I482 Chronic atrial fibrillation, unspecified: Secondary | ICD-10-CM | POA: Diagnosis not present

## 2019-12-18 DIAGNOSIS — W19XXXA Unspecified fall, initial encounter: Secondary | ICD-10-CM | POA: Diagnosis not present

## 2019-12-18 DIAGNOSIS — I480 Paroxysmal atrial fibrillation: Secondary | ICD-10-CM | POA: Diagnosis not present

## 2019-12-18 DIAGNOSIS — Z7901 Long term (current) use of anticoagulants: Secondary | ICD-10-CM | POA: Diagnosis not present

## 2019-12-18 DIAGNOSIS — Z743 Need for continuous supervision: Secondary | ICD-10-CM | POA: Diagnosis not present

## 2019-12-18 DIAGNOSIS — S0990XA Unspecified injury of head, initial encounter: Secondary | ICD-10-CM | POA: Diagnosis not present

## 2019-12-18 DIAGNOSIS — E871 Hypo-osmolality and hyponatremia: Secondary | ICD-10-CM | POA: Diagnosis not present

## 2019-12-18 DIAGNOSIS — R29810 Facial weakness: Secondary | ICD-10-CM | POA: Diagnosis not present

## 2019-12-18 DIAGNOSIS — R69 Illness, unspecified: Secondary | ICD-10-CM | POA: Diagnosis not present

## 2019-12-18 DIAGNOSIS — Y92009 Unspecified place in unspecified non-institutional (private) residence as the place of occurrence of the external cause: Secondary | ICD-10-CM | POA: Diagnosis not present

## 2019-12-18 DIAGNOSIS — R279 Unspecified lack of coordination: Secondary | ICD-10-CM | POA: Diagnosis not present

## 2019-12-18 DIAGNOSIS — Z20822 Contact with and (suspected) exposure to covid-19: Secondary | ICD-10-CM | POA: Diagnosis not present

## 2019-12-18 DIAGNOSIS — I4821 Permanent atrial fibrillation: Secondary | ICD-10-CM | POA: Diagnosis not present

## 2019-12-25 DIAGNOSIS — R1311 Dysphagia, oral phase: Secondary | ICD-10-CM | POA: Diagnosis not present

## 2019-12-25 DIAGNOSIS — I480 Paroxysmal atrial fibrillation: Secondary | ICD-10-CM | POA: Diagnosis not present

## 2019-12-25 DIAGNOSIS — N3091 Cystitis, unspecified with hematuria: Secondary | ICD-10-CM | POA: Diagnosis not present

## 2019-12-25 DIAGNOSIS — S61512D Laceration without foreign body of left wrist, subsequent encounter: Secondary | ICD-10-CM | POA: Diagnosis not present

## 2019-12-25 DIAGNOSIS — R69 Illness, unspecified: Secondary | ICD-10-CM | POA: Diagnosis not present

## 2019-12-25 DIAGNOSIS — W19XXXA Unspecified fall, initial encounter: Secondary | ICD-10-CM | POA: Diagnosis not present

## 2019-12-25 DIAGNOSIS — B9620 Unspecified Escherichia coli [E. coli] as the cause of diseases classified elsewhere: Secondary | ICD-10-CM | POA: Diagnosis not present

## 2019-12-25 DIAGNOSIS — J690 Pneumonitis due to inhalation of food and vomit: Secondary | ICD-10-CM | POA: Diagnosis not present

## 2019-12-25 DIAGNOSIS — W19XXXD Unspecified fall, subsequent encounter: Secondary | ICD-10-CM | POA: Diagnosis not present

## 2019-12-25 DIAGNOSIS — I10 Essential (primary) hypertension: Secondary | ICD-10-CM | POA: Diagnosis not present

## 2019-12-26 DIAGNOSIS — I639 Cerebral infarction, unspecified: Secondary | ICD-10-CM | POA: Diagnosis not present

## 2019-12-26 DIAGNOSIS — R69 Illness, unspecified: Secondary | ICD-10-CM | POA: Diagnosis not present

## 2019-12-26 DIAGNOSIS — G4700 Insomnia, unspecified: Secondary | ICD-10-CM | POA: Diagnosis not present

## 2019-12-27 DIAGNOSIS — R69 Illness, unspecified: Secondary | ICD-10-CM | POA: Diagnosis not present

## 2019-12-27 DIAGNOSIS — I10 Essential (primary) hypertension: Secondary | ICD-10-CM | POA: Diagnosis not present

## 2019-12-27 DIAGNOSIS — W19XXXD Unspecified fall, subsequent encounter: Secondary | ICD-10-CM | POA: Diagnosis not present

## 2019-12-27 DIAGNOSIS — R1311 Dysphagia, oral phase: Secondary | ICD-10-CM | POA: Diagnosis not present

## 2019-12-27 DIAGNOSIS — S61512D Laceration without foreign body of left wrist, subsequent encounter: Secondary | ICD-10-CM | POA: Diagnosis not present

## 2019-12-27 DIAGNOSIS — B9620 Unspecified Escherichia coli [E. coli] as the cause of diseases classified elsewhere: Secondary | ICD-10-CM | POA: Diagnosis not present

## 2019-12-27 DIAGNOSIS — J690 Pneumonitis due to inhalation of food and vomit: Secondary | ICD-10-CM | POA: Diagnosis not present

## 2019-12-27 DIAGNOSIS — N3091 Cystitis, unspecified with hematuria: Secondary | ICD-10-CM | POA: Diagnosis not present

## 2019-12-27 DIAGNOSIS — I480 Paroxysmal atrial fibrillation: Secondary | ICD-10-CM | POA: Diagnosis not present

## 2019-12-28 DIAGNOSIS — S61512A Laceration without foreign body of left wrist, initial encounter: Secondary | ICD-10-CM | POA: Diagnosis not present

## 2019-12-30 DIAGNOSIS — R1311 Dysphagia, oral phase: Secondary | ICD-10-CM | POA: Diagnosis not present

## 2019-12-30 DIAGNOSIS — N3091 Cystitis, unspecified with hematuria: Secondary | ICD-10-CM | POA: Diagnosis not present

## 2019-12-30 DIAGNOSIS — W19XXXD Unspecified fall, subsequent encounter: Secondary | ICD-10-CM | POA: Diagnosis not present

## 2019-12-30 DIAGNOSIS — S61512D Laceration without foreign body of left wrist, subsequent encounter: Secondary | ICD-10-CM | POA: Diagnosis not present

## 2019-12-30 DIAGNOSIS — I480 Paroxysmal atrial fibrillation: Secondary | ICD-10-CM | POA: Diagnosis not present

## 2019-12-30 DIAGNOSIS — R69 Illness, unspecified: Secondary | ICD-10-CM | POA: Diagnosis not present

## 2019-12-30 DIAGNOSIS — J690 Pneumonitis due to inhalation of food and vomit: Secondary | ICD-10-CM | POA: Diagnosis not present

## 2019-12-30 DIAGNOSIS — B9620 Unspecified Escherichia coli [E. coli] as the cause of diseases classified elsewhere: Secondary | ICD-10-CM | POA: Diagnosis not present

## 2019-12-30 DIAGNOSIS — I10 Essential (primary) hypertension: Secondary | ICD-10-CM | POA: Diagnosis not present

## 2019-12-30 DIAGNOSIS — S61512A Laceration without foreign body of left wrist, initial encounter: Secondary | ICD-10-CM | POA: Diagnosis not present

## 2020-01-03 DIAGNOSIS — I10 Essential (primary) hypertension: Secondary | ICD-10-CM | POA: Diagnosis not present

## 2020-01-03 DIAGNOSIS — S61512D Laceration without foreign body of left wrist, subsequent encounter: Secondary | ICD-10-CM | POA: Diagnosis not present

## 2020-01-03 DIAGNOSIS — W19XXXD Unspecified fall, subsequent encounter: Secondary | ICD-10-CM | POA: Diagnosis not present

## 2020-01-03 DIAGNOSIS — I480 Paroxysmal atrial fibrillation: Secondary | ICD-10-CM | POA: Diagnosis not present

## 2020-01-03 DIAGNOSIS — R1311 Dysphagia, oral phase: Secondary | ICD-10-CM | POA: Diagnosis not present

## 2020-01-03 DIAGNOSIS — N3091 Cystitis, unspecified with hematuria: Secondary | ICD-10-CM | POA: Diagnosis not present

## 2020-01-03 DIAGNOSIS — B9620 Unspecified Escherichia coli [E. coli] as the cause of diseases classified elsewhere: Secondary | ICD-10-CM | POA: Diagnosis not present

## 2020-01-03 DIAGNOSIS — R69 Illness, unspecified: Secondary | ICD-10-CM | POA: Diagnosis not present

## 2020-01-03 DIAGNOSIS — J690 Pneumonitis due to inhalation of food and vomit: Secondary | ICD-10-CM | POA: Diagnosis not present

## 2020-01-04 DIAGNOSIS — S61512D Laceration without foreign body of left wrist, subsequent encounter: Secondary | ICD-10-CM | POA: Diagnosis not present

## 2020-01-04 DIAGNOSIS — R1311 Dysphagia, oral phase: Secondary | ICD-10-CM | POA: Diagnosis not present

## 2020-01-04 DIAGNOSIS — J690 Pneumonitis due to inhalation of food and vomit: Secondary | ICD-10-CM | POA: Diagnosis not present

## 2020-01-04 DIAGNOSIS — N3091 Cystitis, unspecified with hematuria: Secondary | ICD-10-CM | POA: Diagnosis not present

## 2020-01-04 DIAGNOSIS — W19XXXD Unspecified fall, subsequent encounter: Secondary | ICD-10-CM | POA: Diagnosis not present

## 2020-01-04 DIAGNOSIS — R69 Illness, unspecified: Secondary | ICD-10-CM | POA: Diagnosis not present

## 2020-01-04 DIAGNOSIS — I10 Essential (primary) hypertension: Secondary | ICD-10-CM | POA: Diagnosis not present

## 2020-01-04 DIAGNOSIS — B9620 Unspecified Escherichia coli [E. coli] as the cause of diseases classified elsewhere: Secondary | ICD-10-CM | POA: Diagnosis not present

## 2020-01-04 DIAGNOSIS — I480 Paroxysmal atrial fibrillation: Secondary | ICD-10-CM | POA: Diagnosis not present

## 2020-01-06 DIAGNOSIS — I480 Paroxysmal atrial fibrillation: Secondary | ICD-10-CM | POA: Diagnosis not present

## 2020-01-06 DIAGNOSIS — N3091 Cystitis, unspecified with hematuria: Secondary | ICD-10-CM | POA: Diagnosis not present

## 2020-01-06 DIAGNOSIS — S61512D Laceration without foreign body of left wrist, subsequent encounter: Secondary | ICD-10-CM | POA: Diagnosis not present

## 2020-01-06 DIAGNOSIS — I10 Essential (primary) hypertension: Secondary | ICD-10-CM | POA: Diagnosis not present

## 2020-01-06 DIAGNOSIS — R1311 Dysphagia, oral phase: Secondary | ICD-10-CM | POA: Diagnosis not present

## 2020-01-06 DIAGNOSIS — J690 Pneumonitis due to inhalation of food and vomit: Secondary | ICD-10-CM | POA: Diagnosis not present

## 2020-01-06 DIAGNOSIS — R69 Illness, unspecified: Secondary | ICD-10-CM | POA: Diagnosis not present

## 2020-01-06 DIAGNOSIS — B9620 Unspecified Escherichia coli [E. coli] as the cause of diseases classified elsewhere: Secondary | ICD-10-CM | POA: Diagnosis not present

## 2020-01-06 DIAGNOSIS — W19XXXD Unspecified fall, subsequent encounter: Secondary | ICD-10-CM | POA: Diagnosis not present

## 2020-01-07 DIAGNOSIS — M25551 Pain in right hip: Secondary | ICD-10-CM | POA: Diagnosis not present

## 2020-01-08 DIAGNOSIS — W19XXXD Unspecified fall, subsequent encounter: Secondary | ICD-10-CM | POA: Diagnosis not present

## 2020-01-08 DIAGNOSIS — N3091 Cystitis, unspecified with hematuria: Secondary | ICD-10-CM | POA: Diagnosis not present

## 2020-01-08 DIAGNOSIS — J690 Pneumonitis due to inhalation of food and vomit: Secondary | ICD-10-CM | POA: Diagnosis not present

## 2020-01-08 DIAGNOSIS — R69 Illness, unspecified: Secondary | ICD-10-CM | POA: Diagnosis not present

## 2020-01-08 DIAGNOSIS — S61512D Laceration without foreign body of left wrist, subsequent encounter: Secondary | ICD-10-CM | POA: Diagnosis not present

## 2020-01-08 DIAGNOSIS — R1311 Dysphagia, oral phase: Secondary | ICD-10-CM | POA: Diagnosis not present

## 2020-01-08 DIAGNOSIS — I480 Paroxysmal atrial fibrillation: Secondary | ICD-10-CM | POA: Diagnosis not present

## 2020-01-08 DIAGNOSIS — B9620 Unspecified Escherichia coli [E. coli] as the cause of diseases classified elsewhere: Secondary | ICD-10-CM | POA: Diagnosis not present

## 2020-01-08 DIAGNOSIS — I10 Essential (primary) hypertension: Secondary | ICD-10-CM | POA: Diagnosis not present

## 2020-01-09 DIAGNOSIS — B9620 Unspecified Escherichia coli [E. coli] as the cause of diseases classified elsewhere: Secondary | ICD-10-CM | POA: Diagnosis not present

## 2020-01-09 DIAGNOSIS — J690 Pneumonitis due to inhalation of food and vomit: Secondary | ICD-10-CM | POA: Diagnosis not present

## 2020-01-09 DIAGNOSIS — N3091 Cystitis, unspecified with hematuria: Secondary | ICD-10-CM | POA: Diagnosis not present

## 2020-01-09 DIAGNOSIS — R1311 Dysphagia, oral phase: Secondary | ICD-10-CM | POA: Diagnosis not present

## 2020-01-09 DIAGNOSIS — S61512D Laceration without foreign body of left wrist, subsequent encounter: Secondary | ICD-10-CM | POA: Diagnosis not present

## 2020-01-09 DIAGNOSIS — I10 Essential (primary) hypertension: Secondary | ICD-10-CM | POA: Diagnosis not present

## 2020-01-09 DIAGNOSIS — W19XXXD Unspecified fall, subsequent encounter: Secondary | ICD-10-CM | POA: Diagnosis not present

## 2020-01-09 DIAGNOSIS — I480 Paroxysmal atrial fibrillation: Secondary | ICD-10-CM | POA: Diagnosis not present

## 2020-01-09 DIAGNOSIS — E7849 Other hyperlipidemia: Secondary | ICD-10-CM | POA: Diagnosis not present

## 2020-01-09 DIAGNOSIS — I4891 Unspecified atrial fibrillation: Secondary | ICD-10-CM | POA: Diagnosis not present

## 2020-01-09 DIAGNOSIS — R69 Illness, unspecified: Secondary | ICD-10-CM | POA: Diagnosis not present

## 2020-01-11 DIAGNOSIS — N3091 Cystitis, unspecified with hematuria: Secondary | ICD-10-CM | POA: Diagnosis not present

## 2020-01-11 DIAGNOSIS — R1311 Dysphagia, oral phase: Secondary | ICD-10-CM | POA: Diagnosis not present

## 2020-01-11 DIAGNOSIS — I10 Essential (primary) hypertension: Secondary | ICD-10-CM | POA: Diagnosis not present

## 2020-01-11 DIAGNOSIS — W19XXXD Unspecified fall, subsequent encounter: Secondary | ICD-10-CM | POA: Diagnosis not present

## 2020-01-11 DIAGNOSIS — I480 Paroxysmal atrial fibrillation: Secondary | ICD-10-CM | POA: Diagnosis not present

## 2020-01-11 DIAGNOSIS — R69 Illness, unspecified: Secondary | ICD-10-CM | POA: Diagnosis not present

## 2020-01-11 DIAGNOSIS — B9620 Unspecified Escherichia coli [E. coli] as the cause of diseases classified elsewhere: Secondary | ICD-10-CM | POA: Diagnosis not present

## 2020-01-11 DIAGNOSIS — J690 Pneumonitis due to inhalation of food and vomit: Secondary | ICD-10-CM | POA: Diagnosis not present

## 2020-01-11 DIAGNOSIS — S61512D Laceration without foreign body of left wrist, subsequent encounter: Secondary | ICD-10-CM | POA: Diagnosis not present

## 2020-01-12 DIAGNOSIS — I10 Essential (primary) hypertension: Secondary | ICD-10-CM | POA: Diagnosis not present

## 2020-01-12 DIAGNOSIS — R1311 Dysphagia, oral phase: Secondary | ICD-10-CM | POA: Diagnosis not present

## 2020-01-12 DIAGNOSIS — S61512D Laceration without foreign body of left wrist, subsequent encounter: Secondary | ICD-10-CM | POA: Diagnosis not present

## 2020-01-12 DIAGNOSIS — B9620 Unspecified Escherichia coli [E. coli] as the cause of diseases classified elsewhere: Secondary | ICD-10-CM | POA: Diagnosis not present

## 2020-01-12 DIAGNOSIS — R69 Illness, unspecified: Secondary | ICD-10-CM | POA: Diagnosis not present

## 2020-01-12 DIAGNOSIS — W19XXXD Unspecified fall, subsequent encounter: Secondary | ICD-10-CM | POA: Diagnosis not present

## 2020-01-12 DIAGNOSIS — I480 Paroxysmal atrial fibrillation: Secondary | ICD-10-CM | POA: Diagnosis not present

## 2020-01-12 DIAGNOSIS — N3091 Cystitis, unspecified with hematuria: Secondary | ICD-10-CM | POA: Diagnosis not present

## 2020-01-12 DIAGNOSIS — J690 Pneumonitis due to inhalation of food and vomit: Secondary | ICD-10-CM | POA: Diagnosis not present

## 2020-01-13 DIAGNOSIS — I10 Essential (primary) hypertension: Secondary | ICD-10-CM | POA: Diagnosis not present

## 2020-01-13 DIAGNOSIS — W19XXXD Unspecified fall, subsequent encounter: Secondary | ICD-10-CM | POA: Diagnosis not present

## 2020-01-13 DIAGNOSIS — S61512D Laceration without foreign body of left wrist, subsequent encounter: Secondary | ICD-10-CM | POA: Diagnosis not present

## 2020-01-13 DIAGNOSIS — R1311 Dysphagia, oral phase: Secondary | ICD-10-CM | POA: Diagnosis not present

## 2020-01-13 DIAGNOSIS — J690 Pneumonitis due to inhalation of food and vomit: Secondary | ICD-10-CM | POA: Diagnosis not present

## 2020-01-13 DIAGNOSIS — B9620 Unspecified Escherichia coli [E. coli] as the cause of diseases classified elsewhere: Secondary | ICD-10-CM | POA: Diagnosis not present

## 2020-01-13 DIAGNOSIS — R69 Illness, unspecified: Secondary | ICD-10-CM | POA: Diagnosis not present

## 2020-01-13 DIAGNOSIS — N3091 Cystitis, unspecified with hematuria: Secondary | ICD-10-CM | POA: Diagnosis not present

## 2020-01-13 DIAGNOSIS — I480 Paroxysmal atrial fibrillation: Secondary | ICD-10-CM | POA: Diagnosis not present

## 2020-01-15 DIAGNOSIS — S0990XA Unspecified injury of head, initial encounter: Secondary | ICD-10-CM | POA: Diagnosis not present

## 2020-01-15 DIAGNOSIS — I447 Left bundle-branch block, unspecified: Secondary | ICD-10-CM | POA: Diagnosis not present

## 2020-01-15 DIAGNOSIS — E782 Mixed hyperlipidemia: Secondary | ICD-10-CM | POA: Diagnosis not present

## 2020-01-15 DIAGNOSIS — R600 Localized edema: Secondary | ICD-10-CM | POA: Diagnosis not present

## 2020-01-15 DIAGNOSIS — R202 Paresthesia of skin: Secondary | ICD-10-CM | POA: Diagnosis not present

## 2020-01-15 DIAGNOSIS — W19XXXD Unspecified fall, subsequent encounter: Secondary | ICD-10-CM | POA: Diagnosis not present

## 2020-01-15 DIAGNOSIS — R791 Abnormal coagulation profile: Secondary | ICD-10-CM | POA: Diagnosis not present

## 2020-01-15 DIAGNOSIS — R7989 Other specified abnormal findings of blood chemistry: Secondary | ICD-10-CM | POA: Diagnosis not present

## 2020-01-15 DIAGNOSIS — Z9989 Dependence on other enabling machines and devices: Secondary | ICD-10-CM | POA: Diagnosis not present

## 2020-01-15 DIAGNOSIS — E222 Syndrome of inappropriate secretion of antidiuretic hormone: Secondary | ICD-10-CM | POA: Diagnosis not present

## 2020-01-15 DIAGNOSIS — Z952 Presence of prosthetic heart valve: Secondary | ICD-10-CM | POA: Diagnosis not present

## 2020-01-15 DIAGNOSIS — W1830XA Fall on same level, unspecified, initial encounter: Secondary | ICD-10-CM | POA: Diagnosis not present

## 2020-01-15 DIAGNOSIS — Z515 Encounter for palliative care: Secondary | ICD-10-CM | POA: Diagnosis not present

## 2020-01-15 DIAGNOSIS — I351 Nonrheumatic aortic (valve) insufficiency: Secondary | ICD-10-CM | POA: Diagnosis not present

## 2020-01-15 DIAGNOSIS — I4891 Unspecified atrial fibrillation: Secondary | ICD-10-CM | POA: Diagnosis not present

## 2020-01-15 DIAGNOSIS — S72041A Displaced fracture of base of neck of right femur, initial encounter for closed fracture: Secondary | ICD-10-CM | POA: Diagnosis not present

## 2020-01-15 DIAGNOSIS — R5381 Other malaise: Secondary | ICD-10-CM | POA: Diagnosis not present

## 2020-01-15 DIAGNOSIS — I5023 Acute on chronic systolic (congestive) heart failure: Secondary | ICD-10-CM | POA: Diagnosis not present

## 2020-01-15 DIAGNOSIS — R64 Cachexia: Secondary | ICD-10-CM | POA: Diagnosis not present

## 2020-01-15 DIAGNOSIS — S72001D Fracture of unspecified part of neck of right femur, subsequent encounter for closed fracture with routine healing: Secondary | ICD-10-CM | POA: Diagnosis not present

## 2020-01-15 DIAGNOSIS — W19XXXA Unspecified fall, initial encounter: Secondary | ICD-10-CM | POA: Diagnosis not present

## 2020-01-15 DIAGNOSIS — I110 Hypertensive heart disease with heart failure: Secondary | ICD-10-CM | POA: Diagnosis not present

## 2020-01-15 DIAGNOSIS — I69392 Facial weakness following cerebral infarction: Secondary | ICD-10-CM | POA: Diagnosis not present

## 2020-01-15 DIAGNOSIS — N3000 Acute cystitis without hematuria: Secondary | ICD-10-CM | POA: Diagnosis not present

## 2020-01-15 DIAGNOSIS — I10 Essential (primary) hypertension: Secondary | ICD-10-CM | POA: Diagnosis not present

## 2020-01-15 DIAGNOSIS — I4892 Unspecified atrial flutter: Secondary | ICD-10-CM | POA: Diagnosis not present

## 2020-01-15 DIAGNOSIS — I4819 Other persistent atrial fibrillation: Secondary | ICD-10-CM | POA: Diagnosis not present

## 2020-01-15 DIAGNOSIS — I5033 Acute on chronic diastolic (congestive) heart failure: Secondary | ICD-10-CM | POA: Diagnosis not present

## 2020-01-15 DIAGNOSIS — M25559 Pain in unspecified hip: Secondary | ICD-10-CM | POA: Diagnosis not present

## 2020-01-15 DIAGNOSIS — R000 Tachycardia, unspecified: Secondary | ICD-10-CM | POA: Diagnosis not present

## 2020-01-15 DIAGNOSIS — M7989 Other specified soft tissue disorders: Secondary | ICD-10-CM | POA: Diagnosis not present

## 2020-01-15 DIAGNOSIS — M9905 Segmental and somatic dysfunction of pelvic region: Secondary | ICD-10-CM | POA: Diagnosis not present

## 2020-01-15 DIAGNOSIS — N390 Urinary tract infection, site not specified: Secondary | ICD-10-CM | POA: Diagnosis not present

## 2020-01-15 DIAGNOSIS — Z01810 Encounter for preprocedural cardiovascular examination: Secondary | ICD-10-CM | POA: Diagnosis not present

## 2020-01-15 DIAGNOSIS — R52 Pain, unspecified: Secondary | ICD-10-CM | POA: Diagnosis not present

## 2020-01-15 DIAGNOSIS — G9341 Metabolic encephalopathy: Secondary | ICD-10-CM | POA: Diagnosis not present

## 2020-01-15 DIAGNOSIS — E46 Unspecified protein-calorie malnutrition: Secondary | ICD-10-CM | POA: Diagnosis not present

## 2020-01-15 DIAGNOSIS — R9431 Abnormal electrocardiogram [ECG] [EKG]: Secondary | ICD-10-CM | POA: Diagnosis not present

## 2020-01-15 DIAGNOSIS — I480 Paroxysmal atrial fibrillation: Secondary | ICD-10-CM | POA: Diagnosis not present

## 2020-01-15 DIAGNOSIS — Z20828 Contact with and (suspected) exposure to other viral communicable diseases: Secondary | ICD-10-CM | POA: Diagnosis not present

## 2020-01-15 DIAGNOSIS — E871 Hypo-osmolality and hyponatremia: Secondary | ICD-10-CM | POA: Diagnosis not present

## 2020-01-15 DIAGNOSIS — S72001A Fracture of unspecified part of neck of right femur, initial encounter for closed fracture: Secondary | ICD-10-CM | POA: Diagnosis not present

## 2020-01-16 DIAGNOSIS — S72001A Fracture of unspecified part of neck of right femur, initial encounter for closed fracture: Secondary | ICD-10-CM | POA: Diagnosis not present

## 2020-01-16 DIAGNOSIS — I5033 Acute on chronic diastolic (congestive) heart failure: Secondary | ICD-10-CM | POA: Diagnosis not present

## 2020-01-17 DIAGNOSIS — S72001A Fracture of unspecified part of neck of right femur, initial encounter for closed fracture: Secondary | ICD-10-CM | POA: Diagnosis not present

## 2020-01-17 DIAGNOSIS — I5033 Acute on chronic diastolic (congestive) heart failure: Secondary | ICD-10-CM | POA: Diagnosis not present

## 2020-01-18 DIAGNOSIS — S72001A Fracture of unspecified part of neck of right femur, initial encounter for closed fracture: Secondary | ICD-10-CM | POA: Diagnosis not present

## 2020-01-18 DIAGNOSIS — W19XXXA Unspecified fall, initial encounter: Secondary | ICD-10-CM | POA: Diagnosis not present

## 2020-01-18 DIAGNOSIS — N3000 Acute cystitis without hematuria: Secondary | ICD-10-CM | POA: Diagnosis not present

## 2020-01-18 DIAGNOSIS — E871 Hypo-osmolality and hyponatremia: Secondary | ICD-10-CM | POA: Diagnosis not present

## 2020-01-18 DIAGNOSIS — I10 Essential (primary) hypertension: Secondary | ICD-10-CM | POA: Diagnosis not present

## 2020-01-18 DIAGNOSIS — G9341 Metabolic encephalopathy: Secondary | ICD-10-CM | POA: Diagnosis not present

## 2020-01-18 DIAGNOSIS — R600 Localized edema: Secondary | ICD-10-CM | POA: Diagnosis not present

## 2020-01-18 DIAGNOSIS — I4819 Other persistent atrial fibrillation: Secondary | ICD-10-CM | POA: Diagnosis not present

## 2020-01-18 DIAGNOSIS — R791 Abnormal coagulation profile: Secondary | ICD-10-CM | POA: Diagnosis not present

## 2020-01-18 DIAGNOSIS — I5033 Acute on chronic diastolic (congestive) heart failure: Secondary | ICD-10-CM | POA: Diagnosis not present

## 2020-01-18 DIAGNOSIS — Z952 Presence of prosthetic heart valve: Secondary | ICD-10-CM | POA: Diagnosis not present

## 2020-01-18 DIAGNOSIS — E782 Mixed hyperlipidemia: Secondary | ICD-10-CM | POA: Diagnosis not present

## 2020-01-19 DIAGNOSIS — N3000 Acute cystitis without hematuria: Secondary | ICD-10-CM | POA: Diagnosis not present

## 2020-01-19 DIAGNOSIS — S72001A Fracture of unspecified part of neck of right femur, initial encounter for closed fracture: Secondary | ICD-10-CM | POA: Diagnosis not present

## 2020-01-19 DIAGNOSIS — G9341 Metabolic encephalopathy: Secondary | ICD-10-CM | POA: Diagnosis not present

## 2020-01-19 DIAGNOSIS — I4819 Other persistent atrial fibrillation: Secondary | ICD-10-CM | POA: Diagnosis not present

## 2020-01-19 DIAGNOSIS — R791 Abnormal coagulation profile: Secondary | ICD-10-CM | POA: Diagnosis not present

## 2020-01-19 DIAGNOSIS — I5033 Acute on chronic diastolic (congestive) heart failure: Secondary | ICD-10-CM | POA: Diagnosis not present

## 2020-01-19 DIAGNOSIS — E871 Hypo-osmolality and hyponatremia: Secondary | ICD-10-CM | POA: Diagnosis not present

## 2020-01-19 DIAGNOSIS — E782 Mixed hyperlipidemia: Secondary | ICD-10-CM | POA: Diagnosis not present

## 2020-01-19 DIAGNOSIS — Z952 Presence of prosthetic heart valve: Secondary | ICD-10-CM | POA: Diagnosis not present

## 2020-01-19 DIAGNOSIS — W19XXXA Unspecified fall, initial encounter: Secondary | ICD-10-CM | POA: Diagnosis not present

## 2020-01-19 DIAGNOSIS — I10 Essential (primary) hypertension: Secondary | ICD-10-CM | POA: Diagnosis not present

## 2020-01-20 DIAGNOSIS — S72001A Fracture of unspecified part of neck of right femur, initial encounter for closed fracture: Secondary | ICD-10-CM | POA: Diagnosis not present

## 2020-01-20 DIAGNOSIS — I4819 Other persistent atrial fibrillation: Secondary | ICD-10-CM | POA: Diagnosis not present

## 2020-01-20 DIAGNOSIS — I5033 Acute on chronic diastolic (congestive) heart failure: Secondary | ICD-10-CM | POA: Diagnosis not present

## 2020-01-20 DIAGNOSIS — R791 Abnormal coagulation profile: Secondary | ICD-10-CM | POA: Diagnosis not present

## 2020-01-20 DIAGNOSIS — W19XXXA Unspecified fall, initial encounter: Secondary | ICD-10-CM | POA: Diagnosis not present

## 2020-01-20 DIAGNOSIS — I10 Essential (primary) hypertension: Secondary | ICD-10-CM | POA: Diagnosis not present

## 2020-01-20 DIAGNOSIS — N3000 Acute cystitis without hematuria: Secondary | ICD-10-CM | POA: Diagnosis not present

## 2020-01-20 DIAGNOSIS — E782 Mixed hyperlipidemia: Secondary | ICD-10-CM | POA: Diagnosis not present

## 2020-01-20 DIAGNOSIS — Z952 Presence of prosthetic heart valve: Secondary | ICD-10-CM | POA: Diagnosis not present

## 2020-01-20 DIAGNOSIS — G9341 Metabolic encephalopathy: Secondary | ICD-10-CM | POA: Diagnosis not present

## 2020-01-20 DIAGNOSIS — E871 Hypo-osmolality and hyponatremia: Secondary | ICD-10-CM | POA: Diagnosis not present

## 2020-01-21 DIAGNOSIS — I5033 Acute on chronic diastolic (congestive) heart failure: Secondary | ICD-10-CM | POA: Diagnosis not present

## 2020-01-21 DIAGNOSIS — R7989 Other specified abnormal findings of blood chemistry: Secondary | ICD-10-CM | POA: Diagnosis not present

## 2020-01-21 DIAGNOSIS — S72001A Fracture of unspecified part of neck of right femur, initial encounter for closed fracture: Secondary | ICD-10-CM | POA: Diagnosis not present

## 2020-01-21 DIAGNOSIS — R791 Abnormal coagulation profile: Secondary | ICD-10-CM | POA: Diagnosis not present

## 2020-01-21 DIAGNOSIS — I4819 Other persistent atrial fibrillation: Secondary | ICD-10-CM | POA: Diagnosis not present

## 2020-01-21 DIAGNOSIS — I10 Essential (primary) hypertension: Secondary | ICD-10-CM | POA: Diagnosis not present

## 2020-01-21 DIAGNOSIS — Z01810 Encounter for preprocedural cardiovascular examination: Secondary | ICD-10-CM | POA: Diagnosis not present

## 2020-01-21 DIAGNOSIS — W19XXXA Unspecified fall, initial encounter: Secondary | ICD-10-CM | POA: Diagnosis not present

## 2020-01-21 DIAGNOSIS — M7989 Other specified soft tissue disorders: Secondary | ICD-10-CM | POA: Diagnosis not present

## 2020-01-21 DIAGNOSIS — E871 Hypo-osmolality and hyponatremia: Secondary | ICD-10-CM | POA: Diagnosis not present

## 2020-01-21 DIAGNOSIS — G9341 Metabolic encephalopathy: Secondary | ICD-10-CM | POA: Diagnosis not present

## 2020-01-21 DIAGNOSIS — Z952 Presence of prosthetic heart valve: Secondary | ICD-10-CM | POA: Diagnosis not present

## 2020-01-22 DIAGNOSIS — R7989 Other specified abnormal findings of blood chemistry: Secondary | ICD-10-CM | POA: Diagnosis not present

## 2020-01-22 DIAGNOSIS — G9341 Metabolic encephalopathy: Secondary | ICD-10-CM | POA: Diagnosis not present

## 2020-01-22 DIAGNOSIS — I10 Essential (primary) hypertension: Secondary | ICD-10-CM | POA: Diagnosis not present

## 2020-01-22 DIAGNOSIS — I5033 Acute on chronic diastolic (congestive) heart failure: Secondary | ICD-10-CM | POA: Diagnosis not present

## 2020-01-22 DIAGNOSIS — M7989 Other specified soft tissue disorders: Secondary | ICD-10-CM | POA: Diagnosis not present

## 2020-01-22 DIAGNOSIS — Z01810 Encounter for preprocedural cardiovascular examination: Secondary | ICD-10-CM | POA: Diagnosis not present

## 2020-01-22 DIAGNOSIS — Z952 Presence of prosthetic heart valve: Secondary | ICD-10-CM | POA: Diagnosis not present

## 2020-01-22 DIAGNOSIS — W19XXXA Unspecified fall, initial encounter: Secondary | ICD-10-CM | POA: Diagnosis not present

## 2020-01-22 DIAGNOSIS — S72001A Fracture of unspecified part of neck of right femur, initial encounter for closed fracture: Secondary | ICD-10-CM | POA: Diagnosis not present

## 2020-01-22 DIAGNOSIS — E871 Hypo-osmolality and hyponatremia: Secondary | ICD-10-CM | POA: Diagnosis not present

## 2020-01-23 DIAGNOSIS — S72001A Fracture of unspecified part of neck of right femur, initial encounter for closed fracture: Secondary | ICD-10-CM | POA: Diagnosis not present

## 2020-01-23 DIAGNOSIS — I447 Left bundle-branch block, unspecified: Secondary | ICD-10-CM | POA: Diagnosis not present

## 2020-01-23 DIAGNOSIS — R7989 Other specified abnormal findings of blood chemistry: Secondary | ICD-10-CM | POA: Diagnosis not present

## 2020-01-23 DIAGNOSIS — G9341 Metabolic encephalopathy: Secondary | ICD-10-CM | POA: Diagnosis not present

## 2020-01-23 DIAGNOSIS — Z952 Presence of prosthetic heart valve: Secondary | ICD-10-CM | POA: Diagnosis not present

## 2020-01-23 DIAGNOSIS — W19XXXA Unspecified fall, initial encounter: Secondary | ICD-10-CM | POA: Diagnosis not present

## 2020-01-23 DIAGNOSIS — I5033 Acute on chronic diastolic (congestive) heart failure: Secondary | ICD-10-CM | POA: Diagnosis not present

## 2020-01-23 DIAGNOSIS — I10 Essential (primary) hypertension: Secondary | ICD-10-CM | POA: Diagnosis not present

## 2020-01-23 DIAGNOSIS — Z01810 Encounter for preprocedural cardiovascular examination: Secondary | ICD-10-CM | POA: Diagnosis not present

## 2020-01-23 DIAGNOSIS — M7989 Other specified soft tissue disorders: Secondary | ICD-10-CM | POA: Diagnosis not present

## 2020-01-23 DIAGNOSIS — E871 Hypo-osmolality and hyponatremia: Secondary | ICD-10-CM | POA: Diagnosis not present

## 2020-01-24 DIAGNOSIS — S72001A Fracture of unspecified part of neck of right femur, initial encounter for closed fracture: Secondary | ICD-10-CM | POA: Diagnosis not present

## 2020-01-24 DIAGNOSIS — I5033 Acute on chronic diastolic (congestive) heart failure: Secondary | ICD-10-CM | POA: Diagnosis not present

## 2020-01-24 DIAGNOSIS — W19XXXA Unspecified fall, initial encounter: Secondary | ICD-10-CM | POA: Diagnosis not present

## 2020-01-24 DIAGNOSIS — Z01810 Encounter for preprocedural cardiovascular examination: Secondary | ICD-10-CM | POA: Diagnosis not present

## 2020-01-24 DIAGNOSIS — Z952 Presence of prosthetic heart valve: Secondary | ICD-10-CM | POA: Diagnosis not present

## 2020-01-24 DIAGNOSIS — I10 Essential (primary) hypertension: Secondary | ICD-10-CM | POA: Diagnosis not present

## 2020-01-24 DIAGNOSIS — E871 Hypo-osmolality and hyponatremia: Secondary | ICD-10-CM | POA: Diagnosis not present

## 2020-01-24 DIAGNOSIS — M7989 Other specified soft tissue disorders: Secondary | ICD-10-CM | POA: Diagnosis not present

## 2020-01-24 DIAGNOSIS — R7989 Other specified abnormal findings of blood chemistry: Secondary | ICD-10-CM | POA: Diagnosis not present

## 2020-01-24 DIAGNOSIS — G9341 Metabolic encephalopathy: Secondary | ICD-10-CM | POA: Diagnosis not present

## 2020-01-25 DIAGNOSIS — G9341 Metabolic encephalopathy: Secondary | ICD-10-CM | POA: Diagnosis not present

## 2020-01-25 DIAGNOSIS — I10 Essential (primary) hypertension: Secondary | ICD-10-CM | POA: Diagnosis not present

## 2020-01-25 DIAGNOSIS — E782 Mixed hyperlipidemia: Secondary | ICD-10-CM | POA: Diagnosis not present

## 2020-01-25 DIAGNOSIS — I5033 Acute on chronic diastolic (congestive) heart failure: Secondary | ICD-10-CM | POA: Diagnosis not present

## 2020-01-25 DIAGNOSIS — W19XXXA Unspecified fall, initial encounter: Secondary | ICD-10-CM | POA: Diagnosis not present

## 2020-01-25 DIAGNOSIS — S72001A Fracture of unspecified part of neck of right femur, initial encounter for closed fracture: Secondary | ICD-10-CM | POA: Diagnosis not present

## 2020-01-25 DIAGNOSIS — I351 Nonrheumatic aortic (valve) insufficiency: Secondary | ICD-10-CM | POA: Diagnosis not present

## 2020-01-25 DIAGNOSIS — E871 Hypo-osmolality and hyponatremia: Secondary | ICD-10-CM | POA: Diagnosis not present

## 2020-01-25 DIAGNOSIS — M7989 Other specified soft tissue disorders: Secondary | ICD-10-CM | POA: Diagnosis not present

## 2020-01-25 DIAGNOSIS — I4819 Other persistent atrial fibrillation: Secondary | ICD-10-CM | POA: Diagnosis not present

## 2020-01-25 DIAGNOSIS — Z952 Presence of prosthetic heart valve: Secondary | ICD-10-CM | POA: Diagnosis not present

## 2020-01-25 DIAGNOSIS — R7989 Other specified abnormal findings of blood chemistry: Secondary | ICD-10-CM | POA: Diagnosis not present

## 2020-01-26 DIAGNOSIS — I10 Essential (primary) hypertension: Secondary | ICD-10-CM | POA: Diagnosis not present

## 2020-01-26 DIAGNOSIS — E871 Hypo-osmolality and hyponatremia: Secondary | ICD-10-CM | POA: Diagnosis not present

## 2020-01-26 DIAGNOSIS — R9431 Abnormal electrocardiogram [ECG] [EKG]: Secondary | ICD-10-CM | POA: Diagnosis not present

## 2020-01-26 DIAGNOSIS — W19XXXA Unspecified fall, initial encounter: Secondary | ICD-10-CM | POA: Diagnosis not present

## 2020-01-26 DIAGNOSIS — G9341 Metabolic encephalopathy: Secondary | ICD-10-CM | POA: Diagnosis not present

## 2020-01-26 DIAGNOSIS — S72001A Fracture of unspecified part of neck of right femur, initial encounter for closed fracture: Secondary | ICD-10-CM | POA: Diagnosis not present

## 2020-01-27 DIAGNOSIS — Z952 Presence of prosthetic heart valve: Secondary | ICD-10-CM | POA: Diagnosis not present

## 2020-01-27 DIAGNOSIS — G9341 Metabolic encephalopathy: Secondary | ICD-10-CM | POA: Diagnosis not present

## 2020-01-27 DIAGNOSIS — E871 Hypo-osmolality and hyponatremia: Secondary | ICD-10-CM | POA: Diagnosis not present

## 2020-01-27 DIAGNOSIS — W19XXXA Unspecified fall, initial encounter: Secondary | ICD-10-CM | POA: Diagnosis not present

## 2020-01-27 DIAGNOSIS — S72001A Fracture of unspecified part of neck of right femur, initial encounter for closed fracture: Secondary | ICD-10-CM | POA: Diagnosis not present

## 2020-01-27 DIAGNOSIS — W19XXXD Unspecified fall, subsequent encounter: Secondary | ICD-10-CM | POA: Diagnosis not present

## 2020-01-27 DIAGNOSIS — I4891 Unspecified atrial fibrillation: Secondary | ICD-10-CM | POA: Diagnosis not present

## 2020-01-27 DIAGNOSIS — I10 Essential (primary) hypertension: Secondary | ICD-10-CM | POA: Diagnosis not present

## 2020-01-27 DIAGNOSIS — I5033 Acute on chronic diastolic (congestive) heart failure: Secondary | ICD-10-CM | POA: Diagnosis not present

## 2020-01-27 DIAGNOSIS — R9431 Abnormal electrocardiogram [ECG] [EKG]: Secondary | ICD-10-CM | POA: Diagnosis not present

## 2020-01-27 DIAGNOSIS — I69392 Facial weakness following cerebral infarction: Secondary | ICD-10-CM | POA: Diagnosis not present

## 2020-01-28 DIAGNOSIS — R9431 Abnormal electrocardiogram [ECG] [EKG]: Secondary | ICD-10-CM | POA: Diagnosis not present

## 2020-01-28 DIAGNOSIS — I5033 Acute on chronic diastolic (congestive) heart failure: Secondary | ICD-10-CM | POA: Diagnosis not present

## 2020-01-28 DIAGNOSIS — W19XXXA Unspecified fall, initial encounter: Secondary | ICD-10-CM | POA: Diagnosis not present

## 2020-01-28 DIAGNOSIS — G9341 Metabolic encephalopathy: Secondary | ICD-10-CM | POA: Diagnosis not present

## 2020-01-28 DIAGNOSIS — Z952 Presence of prosthetic heart valve: Secondary | ICD-10-CM | POA: Diagnosis not present

## 2020-01-28 DIAGNOSIS — E871 Hypo-osmolality and hyponatremia: Secondary | ICD-10-CM | POA: Diagnosis not present

## 2020-01-28 DIAGNOSIS — W19XXXD Unspecified fall, subsequent encounter: Secondary | ICD-10-CM | POA: Diagnosis not present

## 2020-01-28 DIAGNOSIS — S72001A Fracture of unspecified part of neck of right femur, initial encounter for closed fracture: Secondary | ICD-10-CM | POA: Diagnosis not present

## 2020-01-28 DIAGNOSIS — I10 Essential (primary) hypertension: Secondary | ICD-10-CM | POA: Diagnosis not present

## 2020-01-28 DIAGNOSIS — I4891 Unspecified atrial fibrillation: Secondary | ICD-10-CM | POA: Diagnosis not present

## 2020-01-29 DIAGNOSIS — E871 Hypo-osmolality and hyponatremia: Secondary | ICD-10-CM | POA: Diagnosis not present

## 2020-01-30 DIAGNOSIS — R7989 Other specified abnormal findings of blood chemistry: Secondary | ICD-10-CM | POA: Diagnosis not present

## 2020-01-30 DIAGNOSIS — I4891 Unspecified atrial fibrillation: Secondary | ICD-10-CM | POA: Diagnosis not present

## 2020-01-30 DIAGNOSIS — E871 Hypo-osmolality and hyponatremia: Secondary | ICD-10-CM | POA: Diagnosis not present

## 2020-01-30 DIAGNOSIS — M7989 Other specified soft tissue disorders: Secondary | ICD-10-CM | POA: Diagnosis not present

## 2020-01-30 DIAGNOSIS — I10 Essential (primary) hypertension: Secondary | ICD-10-CM | POA: Diagnosis not present

## 2020-01-30 DIAGNOSIS — S72001A Fracture of unspecified part of neck of right femur, initial encounter for closed fracture: Secondary | ICD-10-CM | POA: Diagnosis not present

## 2020-01-30 DIAGNOSIS — Z01810 Encounter for preprocedural cardiovascular examination: Secondary | ICD-10-CM | POA: Diagnosis not present

## 2020-01-30 DIAGNOSIS — W19XXXA Unspecified fall, initial encounter: Secondary | ICD-10-CM | POA: Diagnosis not present

## 2020-01-30 DIAGNOSIS — Z952 Presence of prosthetic heart valve: Secondary | ICD-10-CM | POA: Diagnosis not present

## 2020-01-30 DIAGNOSIS — G9341 Metabolic encephalopathy: Secondary | ICD-10-CM | POA: Diagnosis not present

## 2020-01-30 DIAGNOSIS — I5033 Acute on chronic diastolic (congestive) heart failure: Secondary | ICD-10-CM | POA: Diagnosis not present

## 2020-01-31 DIAGNOSIS — R5381 Other malaise: Secondary | ICD-10-CM | POA: Diagnosis not present

## 2020-01-31 DIAGNOSIS — W19XXXA Unspecified fall, initial encounter: Secondary | ICD-10-CM | POA: Diagnosis not present

## 2020-01-31 DIAGNOSIS — Z01810 Encounter for preprocedural cardiovascular examination: Secondary | ICD-10-CM | POA: Diagnosis not present

## 2020-01-31 DIAGNOSIS — R52 Pain, unspecified: Secondary | ICD-10-CM | POA: Diagnosis not present

## 2020-01-31 DIAGNOSIS — G9341 Metabolic encephalopathy: Secondary | ICD-10-CM | POA: Diagnosis not present

## 2020-01-31 DIAGNOSIS — I5033 Acute on chronic diastolic (congestive) heart failure: Secondary | ICD-10-CM | POA: Diagnosis not present

## 2020-01-31 DIAGNOSIS — E871 Hypo-osmolality and hyponatremia: Secondary | ICD-10-CM | POA: Diagnosis not present

## 2020-01-31 DIAGNOSIS — Z515 Encounter for palliative care: Secondary | ICD-10-CM | POA: Diagnosis not present

## 2020-01-31 DIAGNOSIS — M7989 Other specified soft tissue disorders: Secondary | ICD-10-CM | POA: Diagnosis not present

## 2020-01-31 DIAGNOSIS — I10 Essential (primary) hypertension: Secondary | ICD-10-CM | POA: Diagnosis not present

## 2020-01-31 DIAGNOSIS — S72001A Fracture of unspecified part of neck of right femur, initial encounter for closed fracture: Secondary | ICD-10-CM | POA: Diagnosis not present

## 2020-01-31 DIAGNOSIS — R7989 Other specified abnormal findings of blood chemistry: Secondary | ICD-10-CM | POA: Diagnosis not present

## 2020-01-31 DIAGNOSIS — Z952 Presence of prosthetic heart valve: Secondary | ICD-10-CM | POA: Diagnosis not present

## 2020-02-01 DIAGNOSIS — I5033 Acute on chronic diastolic (congestive) heart failure: Secondary | ICD-10-CM | POA: Diagnosis not present

## 2020-02-01 DIAGNOSIS — M7989 Other specified soft tissue disorders: Secondary | ICD-10-CM | POA: Diagnosis not present

## 2020-02-01 DIAGNOSIS — R5381 Other malaise: Secondary | ICD-10-CM | POA: Diagnosis not present

## 2020-02-01 DIAGNOSIS — S72001A Fracture of unspecified part of neck of right femur, initial encounter for closed fracture: Secondary | ICD-10-CM | POA: Diagnosis not present

## 2020-02-01 DIAGNOSIS — R52 Pain, unspecified: Secondary | ICD-10-CM | POA: Diagnosis not present

## 2020-02-01 DIAGNOSIS — E871 Hypo-osmolality and hyponatremia: Secondary | ICD-10-CM | POA: Diagnosis not present

## 2020-02-01 DIAGNOSIS — Z01810 Encounter for preprocedural cardiovascular examination: Secondary | ICD-10-CM | POA: Diagnosis not present

## 2020-02-01 DIAGNOSIS — W19XXXA Unspecified fall, initial encounter: Secondary | ICD-10-CM | POA: Diagnosis not present

## 2020-02-01 DIAGNOSIS — Z515 Encounter for palliative care: Secondary | ICD-10-CM | POA: Diagnosis not present

## 2020-02-01 DIAGNOSIS — G9341 Metabolic encephalopathy: Secondary | ICD-10-CM | POA: Diagnosis not present

## 2020-02-01 DIAGNOSIS — Z952 Presence of prosthetic heart valve: Secondary | ICD-10-CM | POA: Diagnosis not present

## 2020-02-01 DIAGNOSIS — I10 Essential (primary) hypertension: Secondary | ICD-10-CM | POA: Diagnosis not present

## 2020-02-01 DIAGNOSIS — R7989 Other specified abnormal findings of blood chemistry: Secondary | ICD-10-CM | POA: Diagnosis not present

## 2020-02-02 DIAGNOSIS — I5033 Acute on chronic diastolic (congestive) heart failure: Secondary | ICD-10-CM | POA: Diagnosis not present

## 2020-02-02 DIAGNOSIS — E782 Mixed hyperlipidemia: Secondary | ICD-10-CM | POA: Diagnosis not present

## 2020-02-02 DIAGNOSIS — M7989 Other specified soft tissue disorders: Secondary | ICD-10-CM | POA: Diagnosis not present

## 2020-02-02 DIAGNOSIS — S72001A Fracture of unspecified part of neck of right femur, initial encounter for closed fracture: Secondary | ICD-10-CM | POA: Diagnosis not present

## 2020-02-02 DIAGNOSIS — I10 Essential (primary) hypertension: Secondary | ICD-10-CM | POA: Diagnosis not present

## 2020-02-02 DIAGNOSIS — E871 Hypo-osmolality and hyponatremia: Secondary | ICD-10-CM | POA: Diagnosis not present

## 2020-02-02 DIAGNOSIS — Z952 Presence of prosthetic heart valve: Secondary | ICD-10-CM | POA: Diagnosis not present

## 2020-02-02 DIAGNOSIS — I4819 Other persistent atrial fibrillation: Secondary | ICD-10-CM | POA: Diagnosis not present

## 2020-02-02 DIAGNOSIS — R7989 Other specified abnormal findings of blood chemistry: Secondary | ICD-10-CM | POA: Diagnosis not present

## 2020-02-02 DIAGNOSIS — G9341 Metabolic encephalopathy: Secondary | ICD-10-CM | POA: Diagnosis not present

## 2020-02-03 DIAGNOSIS — R7989 Other specified abnormal findings of blood chemistry: Secondary | ICD-10-CM | POA: Diagnosis not present

## 2020-02-03 DIAGNOSIS — M7989 Other specified soft tissue disorders: Secondary | ICD-10-CM | POA: Diagnosis not present

## 2020-02-03 DIAGNOSIS — S72001A Fracture of unspecified part of neck of right femur, initial encounter for closed fracture: Secondary | ICD-10-CM | POA: Diagnosis not present

## 2020-02-03 DIAGNOSIS — Z952 Presence of prosthetic heart valve: Secondary | ICD-10-CM | POA: Diagnosis not present

## 2020-02-03 DIAGNOSIS — I5033 Acute on chronic diastolic (congestive) heart failure: Secondary | ICD-10-CM | POA: Diagnosis not present

## 2020-02-03 DIAGNOSIS — E871 Hypo-osmolality and hyponatremia: Secondary | ICD-10-CM | POA: Diagnosis not present

## 2020-02-03 DIAGNOSIS — Z01810 Encounter for preprocedural cardiovascular examination: Secondary | ICD-10-CM | POA: Diagnosis not present

## 2020-02-03 DIAGNOSIS — W19XXXA Unspecified fall, initial encounter: Secondary | ICD-10-CM | POA: Diagnosis not present

## 2020-02-03 DIAGNOSIS — G9341 Metabolic encephalopathy: Secondary | ICD-10-CM | POA: Diagnosis not present

## 2020-02-03 DIAGNOSIS — I10 Essential (primary) hypertension: Secondary | ICD-10-CM | POA: Diagnosis not present

## 2020-02-04 DIAGNOSIS — E871 Hypo-osmolality and hyponatremia: Secondary | ICD-10-CM | POA: Diagnosis not present

## 2020-02-06 DIAGNOSIS — E871 Hypo-osmolality and hyponatremia: Secondary | ICD-10-CM | POA: Diagnosis not present

## 2020-02-09 DIAGNOSIS — G9341 Metabolic encephalopathy: Secondary | ICD-10-CM | POA: Diagnosis not present

## 2020-02-09 DIAGNOSIS — E782 Mixed hyperlipidemia: Secondary | ICD-10-CM | POA: Diagnosis not present

## 2020-02-09 DIAGNOSIS — R202 Paresthesia of skin: Secondary | ICD-10-CM | POA: Diagnosis not present

## 2020-02-09 DIAGNOSIS — R7989 Other specified abnormal findings of blood chemistry: Secondary | ICD-10-CM | POA: Diagnosis not present

## 2020-02-09 DIAGNOSIS — I5033 Acute on chronic diastolic (congestive) heart failure: Secondary | ICD-10-CM | POA: Diagnosis not present

## 2020-02-09 DIAGNOSIS — S72001A Fracture of unspecified part of neck of right femur, initial encounter for closed fracture: Secondary | ICD-10-CM | POA: Diagnosis not present

## 2020-02-09 DIAGNOSIS — Z01810 Encounter for preprocedural cardiovascular examination: Secondary | ICD-10-CM | POA: Diagnosis not present

## 2020-02-09 DIAGNOSIS — I10 Essential (primary) hypertension: Secondary | ICD-10-CM | POA: Diagnosis not present

## 2020-02-09 DIAGNOSIS — M25559 Pain in unspecified hip: Secondary | ICD-10-CM | POA: Diagnosis not present

## 2020-02-09 DIAGNOSIS — E871 Hypo-osmolality and hyponatremia: Secondary | ICD-10-CM | POA: Diagnosis not present

## 2020-02-09 DIAGNOSIS — M7989 Other specified soft tissue disorders: Secondary | ICD-10-CM | POA: Diagnosis not present

## 2020-02-09 DIAGNOSIS — Z952 Presence of prosthetic heart valve: Secondary | ICD-10-CM | POA: Diagnosis not present

## 2020-02-09 DIAGNOSIS — M9905 Segmental and somatic dysfunction of pelvic region: Secondary | ICD-10-CM | POA: Diagnosis not present

## 2020-02-10 DIAGNOSIS — S72001D Fracture of unspecified part of neck of right femur, subsequent encounter for closed fracture with routine healing: Secondary | ICD-10-CM | POA: Diagnosis not present

## 2020-02-10 DIAGNOSIS — I110 Hypertensive heart disease with heart failure: Secondary | ICD-10-CM | POA: Diagnosis not present

## 2020-02-10 DIAGNOSIS — I480 Paroxysmal atrial fibrillation: Secondary | ICD-10-CM | POA: Diagnosis not present

## 2020-02-10 DIAGNOSIS — R1311 Dysphagia, oral phase: Secondary | ICD-10-CM | POA: Diagnosis not present

## 2020-02-10 DIAGNOSIS — I4819 Other persistent atrial fibrillation: Secondary | ICD-10-CM | POA: Diagnosis not present

## 2020-02-10 DIAGNOSIS — I5033 Acute on chronic diastolic (congestive) heart failure: Secondary | ICD-10-CM | POA: Diagnosis not present

## 2020-02-10 DIAGNOSIS — R69 Illness, unspecified: Secondary | ICD-10-CM | POA: Diagnosis not present

## 2020-02-10 DIAGNOSIS — W19XXXD Unspecified fall, subsequent encounter: Secondary | ICD-10-CM | POA: Diagnosis not present

## 2020-02-10 DIAGNOSIS — Z7901 Long term (current) use of anticoagulants: Secondary | ICD-10-CM | POA: Diagnosis not present

## 2020-02-10 DIAGNOSIS — N390 Urinary tract infection, site not specified: Secondary | ICD-10-CM | POA: Diagnosis not present

## 2020-02-10 DIAGNOSIS — R609 Edema, unspecified: Secondary | ICD-10-CM | POA: Diagnosis not present

## 2020-02-13 DIAGNOSIS — R69 Illness, unspecified: Secondary | ICD-10-CM | POA: Diagnosis not present

## 2020-02-13 DIAGNOSIS — W19XXXD Unspecified fall, subsequent encounter: Secondary | ICD-10-CM | POA: Diagnosis not present

## 2020-02-13 DIAGNOSIS — I110 Hypertensive heart disease with heart failure: Secondary | ICD-10-CM | POA: Diagnosis not present

## 2020-02-13 DIAGNOSIS — S72001D Fracture of unspecified part of neck of right femur, subsequent encounter for closed fracture with routine healing: Secondary | ICD-10-CM | POA: Diagnosis not present

## 2020-02-13 DIAGNOSIS — I4819 Other persistent atrial fibrillation: Secondary | ICD-10-CM | POA: Diagnosis not present

## 2020-02-13 DIAGNOSIS — N390 Urinary tract infection, site not specified: Secondary | ICD-10-CM | POA: Diagnosis not present

## 2020-02-13 DIAGNOSIS — Z7901 Long term (current) use of anticoagulants: Secondary | ICD-10-CM | POA: Diagnosis not present

## 2020-02-13 DIAGNOSIS — I5033 Acute on chronic diastolic (congestive) heart failure: Secondary | ICD-10-CM | POA: Diagnosis not present

## 2020-02-15 DIAGNOSIS — Z7901 Long term (current) use of anticoagulants: Secondary | ICD-10-CM | POA: Diagnosis not present

## 2020-02-15 DIAGNOSIS — I4819 Other persistent atrial fibrillation: Secondary | ICD-10-CM | POA: Diagnosis not present

## 2020-02-15 DIAGNOSIS — I110 Hypertensive heart disease with heart failure: Secondary | ICD-10-CM | POA: Diagnosis not present

## 2020-02-15 DIAGNOSIS — R69 Illness, unspecified: Secondary | ICD-10-CM | POA: Diagnosis not present

## 2020-02-15 DIAGNOSIS — L608 Other nail disorders: Secondary | ICD-10-CM | POA: Diagnosis not present

## 2020-02-15 DIAGNOSIS — I5033 Acute on chronic diastolic (congestive) heart failure: Secondary | ICD-10-CM | POA: Diagnosis not present

## 2020-02-15 DIAGNOSIS — W19XXXD Unspecified fall, subsequent encounter: Secondary | ICD-10-CM | POA: Diagnosis not present

## 2020-02-15 DIAGNOSIS — L89611 Pressure ulcer of right heel, stage 1: Secondary | ICD-10-CM | POA: Diagnosis not present

## 2020-02-15 DIAGNOSIS — B351 Tinea unguium: Secondary | ICD-10-CM | POA: Diagnosis not present

## 2020-02-15 DIAGNOSIS — S72001D Fracture of unspecified part of neck of right femur, subsequent encounter for closed fracture with routine healing: Secondary | ICD-10-CM | POA: Diagnosis not present

## 2020-02-15 DIAGNOSIS — I70293 Other atherosclerosis of native arteries of extremities, bilateral legs: Secondary | ICD-10-CM | POA: Diagnosis not present

## 2020-02-15 DIAGNOSIS — N390 Urinary tract infection, site not specified: Secondary | ICD-10-CM | POA: Diagnosis not present

## 2020-02-16 DIAGNOSIS — I4819 Other persistent atrial fibrillation: Secondary | ICD-10-CM | POA: Diagnosis not present

## 2020-02-16 DIAGNOSIS — F0391 Unspecified dementia with behavioral disturbance: Secondary | ICD-10-CM | POA: Diagnosis not present

## 2020-02-16 DIAGNOSIS — I4891 Unspecified atrial fibrillation: Secondary | ICD-10-CM | POA: Diagnosis not present

## 2020-02-16 DIAGNOSIS — F0390 Unspecified dementia without behavioral disturbance: Secondary | ICD-10-CM | POA: Diagnosis not present

## 2020-02-16 DIAGNOSIS — Z7901 Long term (current) use of anticoagulants: Secondary | ICD-10-CM | POA: Diagnosis not present

## 2020-02-16 DIAGNOSIS — F064 Anxiety disorder due to known physiological condition: Secondary | ICD-10-CM | POA: Diagnosis not present

## 2020-02-16 DIAGNOSIS — N390 Urinary tract infection, site not specified: Secondary | ICD-10-CM | POA: Diagnosis not present

## 2020-02-16 DIAGNOSIS — F0151 Vascular dementia with behavioral disturbance: Secondary | ICD-10-CM | POA: Diagnosis not present

## 2020-02-16 DIAGNOSIS — I5033 Acute on chronic diastolic (congestive) heart failure: Secondary | ICD-10-CM | POA: Diagnosis not present

## 2020-02-16 DIAGNOSIS — S72001D Fracture of unspecified part of neck of right femur, subsequent encounter for closed fracture with routine healing: Secondary | ICD-10-CM | POA: Diagnosis not present

## 2020-02-16 DIAGNOSIS — H10401 Unspecified chronic conjunctivitis, right eye: Secondary | ICD-10-CM | POA: Diagnosis not present

## 2020-02-16 DIAGNOSIS — G4700 Insomnia, unspecified: Secondary | ICD-10-CM | POA: Diagnosis not present

## 2020-02-16 DIAGNOSIS — F339 Major depressive disorder, recurrent, unspecified: Secondary | ICD-10-CM | POA: Diagnosis not present

## 2020-02-16 DIAGNOSIS — F419 Anxiety disorder, unspecified: Secondary | ICD-10-CM | POA: Diagnosis not present

## 2020-02-16 DIAGNOSIS — I110 Hypertensive heart disease with heart failure: Secondary | ICD-10-CM | POA: Diagnosis not present

## 2020-02-16 DIAGNOSIS — R69 Illness, unspecified: Secondary | ICD-10-CM | POA: Diagnosis not present

## 2020-02-16 DIAGNOSIS — W19XXXD Unspecified fall, subsequent encounter: Secondary | ICD-10-CM | POA: Diagnosis not present

## 2020-02-17 DIAGNOSIS — I4819 Other persistent atrial fibrillation: Secondary | ICD-10-CM | POA: Diagnosis not present

## 2020-02-17 DIAGNOSIS — I5033 Acute on chronic diastolic (congestive) heart failure: Secondary | ICD-10-CM | POA: Diagnosis not present

## 2020-02-17 DIAGNOSIS — I110 Hypertensive heart disease with heart failure: Secondary | ICD-10-CM | POA: Diagnosis not present

## 2020-02-17 DIAGNOSIS — S72001D Fracture of unspecified part of neck of right femur, subsequent encounter for closed fracture with routine healing: Secondary | ICD-10-CM | POA: Diagnosis not present

## 2020-02-22 DIAGNOSIS — N390 Urinary tract infection, site not specified: Secondary | ICD-10-CM | POA: Diagnosis not present

## 2020-02-22 DIAGNOSIS — I110 Hypertensive heart disease with heart failure: Secondary | ICD-10-CM | POA: Diagnosis not present

## 2020-02-22 DIAGNOSIS — R69 Illness, unspecified: Secondary | ICD-10-CM | POA: Diagnosis not present

## 2020-02-22 DIAGNOSIS — L89611 Pressure ulcer of right heel, stage 1: Secondary | ICD-10-CM | POA: Diagnosis not present

## 2020-02-22 DIAGNOSIS — W19XXXD Unspecified fall, subsequent encounter: Secondary | ICD-10-CM | POA: Diagnosis not present

## 2020-02-22 DIAGNOSIS — S72001D Fracture of unspecified part of neck of right femur, subsequent encounter for closed fracture with routine healing: Secondary | ICD-10-CM | POA: Diagnosis not present

## 2020-02-22 DIAGNOSIS — I4819 Other persistent atrial fibrillation: Secondary | ICD-10-CM | POA: Diagnosis not present

## 2020-02-22 DIAGNOSIS — I5033 Acute on chronic diastolic (congestive) heart failure: Secondary | ICD-10-CM | POA: Diagnosis not present

## 2020-02-22 DIAGNOSIS — Z7901 Long term (current) use of anticoagulants: Secondary | ICD-10-CM | POA: Diagnosis not present

## 2020-02-23 DIAGNOSIS — G4700 Insomnia, unspecified: Secondary | ICD-10-CM | POA: Diagnosis not present

## 2020-02-23 DIAGNOSIS — F064 Anxiety disorder due to known physiological condition: Secondary | ICD-10-CM | POA: Diagnosis not present

## 2020-02-23 DIAGNOSIS — F0151 Vascular dementia with behavioral disturbance: Secondary | ICD-10-CM | POA: Diagnosis not present

## 2020-02-23 DIAGNOSIS — H10401 Unspecified chronic conjunctivitis, right eye: Secondary | ICD-10-CM | POA: Diagnosis not present

## 2020-02-23 DIAGNOSIS — I4891 Unspecified atrial fibrillation: Secondary | ICD-10-CM | POA: Diagnosis not present

## 2020-02-23 DIAGNOSIS — R69 Illness, unspecified: Secondary | ICD-10-CM | POA: Diagnosis not present

## 2020-02-23 DIAGNOSIS — F339 Major depressive disorder, recurrent, unspecified: Secondary | ICD-10-CM | POA: Diagnosis not present

## 2020-02-23 DIAGNOSIS — F0391 Unspecified dementia with behavioral disturbance: Secondary | ICD-10-CM | POA: Diagnosis not present

## 2020-02-23 DIAGNOSIS — F419 Anxiety disorder, unspecified: Secondary | ICD-10-CM | POA: Diagnosis not present

## 2020-02-23 DIAGNOSIS — F0390 Unspecified dementia without behavioral disturbance: Secondary | ICD-10-CM | POA: Diagnosis not present

## 2020-02-24 DIAGNOSIS — I4891 Unspecified atrial fibrillation: Secondary | ICD-10-CM | POA: Diagnosis not present

## 2020-02-24 DIAGNOSIS — I110 Hypertensive heart disease with heart failure: Secondary | ICD-10-CM | POA: Diagnosis not present

## 2020-02-24 DIAGNOSIS — F339 Major depressive disorder, recurrent, unspecified: Secondary | ICD-10-CM | POA: Diagnosis not present

## 2020-02-24 DIAGNOSIS — W19XXXD Unspecified fall, subsequent encounter: Secondary | ICD-10-CM | POA: Diagnosis not present

## 2020-02-24 DIAGNOSIS — I5033 Acute on chronic diastolic (congestive) heart failure: Secondary | ICD-10-CM | POA: Diagnosis not present

## 2020-02-24 DIAGNOSIS — E7849 Other hyperlipidemia: Secondary | ICD-10-CM | POA: Diagnosis not present

## 2020-02-24 DIAGNOSIS — M2550 Pain in unspecified joint: Secondary | ICD-10-CM | POA: Diagnosis not present

## 2020-02-24 DIAGNOSIS — I10 Essential (primary) hypertension: Secondary | ICD-10-CM | POA: Diagnosis not present

## 2020-02-24 DIAGNOSIS — Z954 Presence of other heart-valve replacement: Secondary | ICD-10-CM | POA: Diagnosis not present

## 2020-02-24 DIAGNOSIS — N390 Urinary tract infection, site not specified: Secondary | ICD-10-CM | POA: Diagnosis not present

## 2020-02-24 DIAGNOSIS — K5900 Constipation, unspecified: Secondary | ICD-10-CM | POA: Diagnosis not present

## 2020-02-24 DIAGNOSIS — S72001D Fracture of unspecified part of neck of right femur, subsequent encounter for closed fracture with routine healing: Secondary | ICD-10-CM | POA: Diagnosis not present

## 2020-02-24 DIAGNOSIS — I4819 Other persistent atrial fibrillation: Secondary | ICD-10-CM | POA: Diagnosis not present

## 2020-02-24 DIAGNOSIS — Z7901 Long term (current) use of anticoagulants: Secondary | ICD-10-CM | POA: Diagnosis not present

## 2020-02-24 DIAGNOSIS — F0391 Unspecified dementia with behavioral disturbance: Secondary | ICD-10-CM | POA: Diagnosis not present

## 2020-02-24 DIAGNOSIS — F064 Anxiety disorder due to known physiological condition: Secondary | ICD-10-CM | POA: Diagnosis not present

## 2020-02-24 DIAGNOSIS — R69 Illness, unspecified: Secondary | ICD-10-CM | POA: Diagnosis not present

## 2020-02-28 DIAGNOSIS — I4891 Unspecified atrial fibrillation: Secondary | ICD-10-CM | POA: Diagnosis not present

## 2020-02-28 DIAGNOSIS — R69 Illness, unspecified: Secondary | ICD-10-CM | POA: Diagnosis not present

## 2020-03-07 DIAGNOSIS — L89611 Pressure ulcer of right heel, stage 1: Secondary | ICD-10-CM | POA: Diagnosis not present

## 2020-03-11 DIAGNOSIS — R609 Edema, unspecified: Secondary | ICD-10-CM | POA: Diagnosis not present

## 2020-03-11 DIAGNOSIS — R69 Illness, unspecified: Secondary | ICD-10-CM | POA: Diagnosis not present

## 2020-03-11 DIAGNOSIS — R1311 Dysphagia, oral phase: Secondary | ICD-10-CM | POA: Diagnosis not present

## 2020-03-11 DIAGNOSIS — I480 Paroxysmal atrial fibrillation: Secondary | ICD-10-CM | POA: Diagnosis not present

## 2020-03-16 DIAGNOSIS — Z954 Presence of other heart-valve replacement: Secondary | ICD-10-CM | POA: Diagnosis not present

## 2020-03-16 DIAGNOSIS — E7849 Other hyperlipidemia: Secondary | ICD-10-CM | POA: Diagnosis not present

## 2020-03-16 DIAGNOSIS — M2550 Pain in unspecified joint: Secondary | ICD-10-CM | POA: Diagnosis not present

## 2020-03-16 DIAGNOSIS — R69 Illness, unspecified: Secondary | ICD-10-CM | POA: Diagnosis not present

## 2020-03-16 DIAGNOSIS — I4891 Unspecified atrial fibrillation: Secondary | ICD-10-CM | POA: Diagnosis not present

## 2020-03-16 DIAGNOSIS — I10 Essential (primary) hypertension: Secondary | ICD-10-CM | POA: Diagnosis not present

## 2020-03-16 DIAGNOSIS — K5900 Constipation, unspecified: Secondary | ICD-10-CM | POA: Diagnosis not present

## 2020-03-22 DIAGNOSIS — R69 Illness, unspecified: Secondary | ICD-10-CM | POA: Diagnosis not present

## 2020-03-22 DIAGNOSIS — I519 Heart disease, unspecified: Secondary | ICD-10-CM | POA: Diagnosis not present

## 2020-03-22 DIAGNOSIS — I10 Essential (primary) hypertension: Secondary | ICD-10-CM | POA: Diagnosis not present

## 2020-03-22 DIAGNOSIS — I4891 Unspecified atrial fibrillation: Secondary | ICD-10-CM | POA: Diagnosis not present

## 2020-03-23 DIAGNOSIS — I4891 Unspecified atrial fibrillation: Secondary | ICD-10-CM | POA: Diagnosis not present

## 2020-03-23 DIAGNOSIS — R69 Illness, unspecified: Secondary | ICD-10-CM | POA: Diagnosis not present

## 2020-03-23 DIAGNOSIS — E7849 Other hyperlipidemia: Secondary | ICD-10-CM | POA: Diagnosis not present

## 2020-04-11 DIAGNOSIS — I480 Paroxysmal atrial fibrillation: Secondary | ICD-10-CM | POA: Diagnosis not present

## 2020-04-11 DIAGNOSIS — R609 Edema, unspecified: Secondary | ICD-10-CM | POA: Diagnosis not present

## 2020-04-11 DIAGNOSIS — R69 Illness, unspecified: Secondary | ICD-10-CM | POA: Diagnosis not present

## 2020-04-11 DIAGNOSIS — R1311 Dysphagia, oral phase: Secondary | ICD-10-CM | POA: Diagnosis not present

## 2020-04-18 DIAGNOSIS — B351 Tinea unguium: Secondary | ICD-10-CM | POA: Diagnosis not present

## 2020-04-18 DIAGNOSIS — I70293 Other atherosclerosis of native arteries of extremities, bilateral legs: Secondary | ICD-10-CM | POA: Diagnosis not present

## 2020-04-18 DIAGNOSIS — L608 Other nail disorders: Secondary | ICD-10-CM | POA: Diagnosis not present

## 2020-04-27 DIAGNOSIS — Z0189 Encounter for other specified special examinations: Secondary | ICD-10-CM | POA: Diagnosis not present

## 2020-05-16 DIAGNOSIS — M6281 Muscle weakness (generalized): Secondary | ICD-10-CM | POA: Diagnosis not present

## 2020-05-16 DIAGNOSIS — R1310 Dysphagia, unspecified: Secondary | ICD-10-CM | POA: Diagnosis not present

## 2020-05-16 DIAGNOSIS — G311 Senile degeneration of brain, not elsewhere classified: Secondary | ICD-10-CM | POA: Diagnosis not present

## 2020-05-16 DIAGNOSIS — R69 Illness, unspecified: Secondary | ICD-10-CM | POA: Diagnosis not present

## 2020-06-06 DIAGNOSIS — G8911 Acute pain due to trauma: Secondary | ICD-10-CM | POA: Diagnosis not present

## 2020-06-07 DIAGNOSIS — G4700 Insomnia, unspecified: Secondary | ICD-10-CM | POA: Diagnosis not present

## 2020-06-07 DIAGNOSIS — I10 Essential (primary) hypertension: Secondary | ICD-10-CM | POA: Diagnosis not present

## 2020-06-07 DIAGNOSIS — M6281 Muscle weakness (generalized): Secondary | ICD-10-CM | POA: Diagnosis not present

## 2020-06-07 DIAGNOSIS — R69 Illness, unspecified: Secondary | ICD-10-CM | POA: Diagnosis not present

## 2020-06-07 DIAGNOSIS — I4891 Unspecified atrial fibrillation: Secondary | ICD-10-CM | POA: Diagnosis not present

## 2020-06-07 DIAGNOSIS — K5900 Constipation, unspecified: Secondary | ICD-10-CM | POA: Diagnosis not present

## 2020-08-10 DIAGNOSIS — E561 Deficiency of vitamin K: Secondary | ICD-10-CM | POA: Diagnosis not present
